# Patient Record
Sex: Female | Born: 1980 | Hispanic: Yes | Marital: Married | Smoking: Never smoker
Health system: Southern US, Community
[De-identification: ages and names within clinical notes are randomized; demographics above are authoritative.]

## PROBLEM LIST (undated history)

## (undated) ENCOUNTER — Inpatient Hospital Stay (HOSPITAL_COMMUNITY): Payer: Self-pay

## (undated) DIAGNOSIS — Z1501 Genetic susceptibility to malignant neoplasm of breast: Secondary | ICD-10-CM

## (undated) DIAGNOSIS — Z8481 Family history of carrier of genetic disease: Secondary | ICD-10-CM

## (undated) DIAGNOSIS — Z8669 Personal history of other diseases of the nervous system and sense organs: Secondary | ICD-10-CM

## (undated) DIAGNOSIS — Z803 Family history of malignant neoplasm of breast: Secondary | ICD-10-CM

## (undated) DIAGNOSIS — B009 Herpesviral infection, unspecified: Secondary | ICD-10-CM

## (undated) DIAGNOSIS — Z1509 Genetic susceptibility to other malignant neoplasm: Secondary | ICD-10-CM

## (undated) DIAGNOSIS — Z8489 Family history of other specified conditions: Secondary | ICD-10-CM

## (undated) DIAGNOSIS — C801 Malignant (primary) neoplasm, unspecified: Secondary | ICD-10-CM

## (undated) HISTORY — PX: BREAST SURGERY: SHX581

## (undated) HISTORY — DX: Personal history of other diseases of the nervous system and sense organs: Z86.69

## (undated) HISTORY — PX: ABDOMINAL HYSTERECTOMY: SHX81

## (undated) HISTORY — PX: TUBAL LIGATION: SHX77

## (undated) HISTORY — DX: Family history of carrier of genetic disease: Z84.81

## (undated) HISTORY — DX: Genetic susceptibility to malignant neoplasm of breast: Z15.01

## (undated) HISTORY — DX: Genetic susceptibility to malignant neoplasm of breast: Z15.09

## (undated) HISTORY — DX: Family history of malignant neoplasm of breast: Z80.3

## (undated) HISTORY — PX: BREAST BIOPSY: SHX20

---

## 1998-11-21 DIAGNOSIS — Z8669 Personal history of other diseases of the nervous system and sense organs: Secondary | ICD-10-CM

## 1998-11-21 HISTORY — DX: Personal history of other diseases of the nervous system and sense organs: Z86.69

## 2001-10-07 ENCOUNTER — Emergency Department (HOSPITAL_COMMUNITY): Admission: EM | Admit: 2001-10-07 | Discharge: 2001-10-07 | Payer: Self-pay | Admitting: *Deleted

## 2001-10-15 ENCOUNTER — Ambulatory Visit (HOSPITAL_COMMUNITY): Admission: RE | Admit: 2001-10-15 | Discharge: 2001-10-15 | Payer: Self-pay | Admitting: Family Medicine

## 2001-10-15 ENCOUNTER — Encounter: Payer: Self-pay | Admitting: Family Medicine

## 2002-02-04 ENCOUNTER — Ambulatory Visit (HOSPITAL_COMMUNITY): Admission: RE | Admit: 2002-02-04 | Discharge: 2002-02-04 | Payer: Self-pay | Admitting: Family Medicine

## 2002-02-04 ENCOUNTER — Encounter: Payer: Self-pay | Admitting: Family Medicine

## 2002-02-25 ENCOUNTER — Encounter: Payer: Self-pay | Admitting: Orthopaedic Surgery

## 2002-02-25 ENCOUNTER — Ambulatory Visit (HOSPITAL_COMMUNITY): Admission: RE | Admit: 2002-02-25 | Discharge: 2002-02-25 | Payer: Self-pay | Admitting: Orthopaedic Surgery

## 2006-03-14 ENCOUNTER — Ambulatory Visit (HOSPITAL_COMMUNITY): Admission: RE | Admit: 2006-03-14 | Discharge: 2006-03-14 | Payer: Self-pay | Admitting: *Deleted

## 2006-08-08 ENCOUNTER — Inpatient Hospital Stay (HOSPITAL_COMMUNITY): Admission: EM | Admit: 2006-08-08 | Discharge: 2006-08-12 | Payer: Self-pay | Admitting: Obstetrics and Gynecology

## 2008-02-18 ENCOUNTER — Emergency Department (HOSPITAL_COMMUNITY): Admission: EM | Admit: 2008-02-18 | Discharge: 2008-02-18 | Payer: Self-pay | Admitting: Emergency Medicine

## 2008-03-26 ENCOUNTER — Other Ambulatory Visit: Admission: RE | Admit: 2008-03-26 | Discharge: 2008-03-26 | Payer: Self-pay | Admitting: Obstetrics and Gynecology

## 2008-09-24 ENCOUNTER — Ambulatory Visit: Payer: Self-pay | Admitting: Obstetrics and Gynecology

## 2008-09-24 ENCOUNTER — Encounter: Payer: Self-pay | Admitting: Obstetrics & Gynecology

## 2008-09-24 ENCOUNTER — Inpatient Hospital Stay (HOSPITAL_COMMUNITY): Admission: AD | Admit: 2008-09-24 | Discharge: 2008-09-26 | Payer: Self-pay | Admitting: Obstetrics & Gynecology

## 2010-01-13 ENCOUNTER — Emergency Department (HOSPITAL_COMMUNITY): Admission: EM | Admit: 2010-01-13 | Discharge: 2010-01-13 | Payer: Self-pay | Admitting: Emergency Medicine

## 2011-02-09 LAB — STREP A DNA PROBE: Group A Strep Probe: NEGATIVE

## 2011-02-09 LAB — RAPID STREP SCREEN (MED CTR MEBANE ONLY): Streptococcus, Group A Screen (Direct): NEGATIVE

## 2011-04-08 NOTE — Op Note (Signed)
NAME:  Monica Hancock, Monica Hancock            ACCOUNT NO.:  [id number]   MEDICAL RECORD NO.:  [id number]          PATIENT TYPE:  INP   LOCATION:  A412                          FACILITY:  [facility]   PHYSICIAN:  Lazaro Arms, M.D.   DATE OF BIRTH:  [birth year]   DATE OF PROCEDURE:  08/09/2006  DATE OF DISCHARGE:                                 OPERATIVE REPORT   INDICATIONS FOR PROCEDURE:  Monica Hancock is a 30 year old gravida 2, para 1 in  the active phase of labor.  She is 6 cm, 100% effaced and has requested an  epidural be placed.   DESCRIPTION OF PROCEDURE:  She was placed in a sitting position.  Betadine  prep was used.  1% lidocaine was injected in the L3-L4 interspace.  The area  was field draped.  A 17 gauge Tuohy needle was used, loss of resistance  technique employed and the epidural space was found with one pass without  difficulty.  10 mL of 0.125% bupivacaine plain was given as a test dose  without ill effects.  The epidural catheter was then fed through the Tuohy  needle and taped down 5 cm from the epidural space.  An additional 10 mL of  0.125% bupivacaine plain was given.  A continuous infusion of 0.125%  bupivacaine with 2 mcg per mL of fentanyl was begun at 12 mL per hour.  The  patient is asymptomatic and beginning to get pain relief.      Lazaro Arms, M.D.  Electronically Signed     LHE/MEDQ  D:  08/09/2006  T:  08/11/2006  Job:  [id number]

## 2011-04-08 NOTE — H&P (Signed)
NAME:  Monica Hancock, MARSAN NO.:  [id number]   MEDICAL RECORD NO.:  [id number]            PATIENT TYPE:   LOCATION:                                 FACILITY:   PHYSICIAN:  Langley Gauss, MD          DATE OF BIRTH:   DATE OF ADMISSION:  DATE OF DISCHARGE:  LH                                HISTORY & PHYSICAL   DATE OF ADMISSION:  Indeterminate.   PLANNED PROCEDURE:  Outpatient genetic amniocentesis.   INDICATION:  Increased risk of Down syndrome.   HISTORY:  The patient is a 30 year old gravida 1, para 1, with a reliable  last menstrual period and good first trimester crown-rump length who comes  in today for discussion of abnormal AFP triple screen. Testing was performed  on February 21, 2006 at which time patient was noted to be 15-2/[redacted] weeks  gestation by a previously described dating criteria. Abnormal results  subsequently revealed positive screen for Down syndrome. Patient is noted to  be at 1 out of 69 risk of Down syndrome. She is not of advanced maternal  age, there is no family history of any genetic disorder, specifically no  prior history of any Down syndrome or repetitive miscarriages. The patient  herself did not recognize the term Down syndrome nor was she familiar with  the term mongoloid. Thus she was shown a photograph and given informational  material for patient understanding regarding Down syndrome, the typical  facial features, the likelihood of at least a mild mental retardation,  increased risk of congenital anomalies, and a shortened life span. Discussed  specifically with the patient that the number generator does not indicate  that the baby is or is not affected with Down syndrome, rather she is  considered to be high risk only because of numbers generated to place her at  slightly increased risk at 1 in 67 which translates into 68 out of 69 that  the baby is unaffected. I offered the patient specifically performance of  genetic amniocentesis  with a 2-day turn around for the results. The patient  is noted to have a posterior placenta with adequate amniotic fluid volume  thus I advised her that the performance of the genetic amniocentesis would  be fairly straight forward and should be considered to be low risk, less  than the stated 1 in 200 risk of fetal loss. The patient is somewhat  disturbed by the abnormal findings. Her sister is also here during these  discussions so any additional questions can possibly be answered by her  sister. The patient does have an anatomic survey scheduled at [facility] in 1 week's time. I advised her it would be best if she make a  decision one way or another on performance of the genetic amniocentesis  within the next 5 days. She will be following up in the office in 1 week's  time or calling sooner if a decision has been made. The patient was also  given the option that the amniocentesis not be performed as she is noted to  be only  1 in 69 risk of Down syndrome.   PAST OBSTETRIC HISTORY:  Significant for prior vaginal delivery in 1993 at  36 weeks, a 5 pound, 4 ounce infant.   ASSESSMENT AND PLAN:  Increased risk for Down syndrome based on AFP triple  screen. Offered genetic amniocentesis on today's visit. If patient opts to  have this done this procedure can be scheduled as an outpatient at [facility].      Langley Gauss, MD  Electronically Signed     DC/MEDQ  D:  03/09/2006  T:  03/09/2006  Job:  [id number]

## 2011-04-08 NOTE — H&P (Signed)
NAME:  Monica Hancock, Monica Hancock            ACCOUNT NO.:  [id number]   MEDICAL RECORD NO.:  [id number]           PATIENT TYPE:  INP   LOCATION:  LDR4                          FACILITY:  [facility]   PHYSICIAN:  Tilda Burrow, M.D. DATE OF BIRTH:  Mar 26, 1981   DATE OF ADMISSION:  DATE OF DISCHARGE:  LH                                HISTORY & PHYSICAL   HISTORY OF PRESENT ILLNESS:  Monica Hancock is a 30 year old gravida 2, para 1.  EDC is 09/22.  She is in for early labor.  She was admitted last night for  observation.  She was 1, thick, minus 3.  She is now 2, 70, and minus 1.   MEDICAL HISTORY:  Negative.   SURGICAL HISTORY:  Negative.   ALLERGIES:  She has no known allergies.   LABORATORY DATA:  Blood type is O positive.  UVS is negative.  Rubella is  nonimmune.  Hepatitis B surface antigen is negative.  HIV is nonreactive.  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  Serology is nonreactive.  GC and Chlamydia on both  cultures are negative.  AFB was abnormal.  She had an ultrasound that ruled  out [place].  GBS is negative.  A 28-week hemoglobin 12, 28-week hematocrit  33.4, 1-hour glucose is 110.  GBS is pending.   PLAN:  We are going to admit and start Pitocin augmentation.      [name], [name]      Tilda Burrow, M.D.  Electronically Signed    DL/MEDQ  D:  14/78/2956  T:  08/09/2006  Job:  [id number]

## 2011-04-08 NOTE — Op Note (Signed)
NAME:  Monica Hancock, Monica Hancock            ACCOUNT NO.:  [id number]   MEDICAL RECORD NO.:  [id number]          PATIENT TYPE:  INP   LOCATION:  A412                          FACILITY:  [facility]   PHYSICIAN:  Tilda Burrow, M.D. DATE OF BIRTH:  19-Jan-1981   DATE OF PROCEDURE:  DATE OF DISCHARGE:                                 OPERATIVE REPORT   HISTORY OF PRESENT ILLNESS:  Onset of labor on September 19, at 9 a.m.Marland Kitchen  Date of delivery:  September 19, at 11 p.m.  Length of first stage of labor  10 hours and 24 minutes.  Length of second stage labor 47 minutes, length of  third stage labor 10 minutes.   DELIVERY NOTE:  [name] had a vacuum assisted delivery under epidural  anesthesia from a crowning station due to fetal decelerations and fetal  bradycardia.  Vacuum was placed on the vertex presentation and Kiwi pump to  green marker on the Kiwi Vac and assisted during three contractions.  Midline episiotomy was cut and infant had delivered without complication and  spontaneous __________were noted.  Apgars were 9 and 9.  Cord blood gas and  cord blood was obtained.  Third stage of labor was actually managed with 20  units of Pitocin and 7 cc of LR to rapid rate.  Placenta was delivered  spontaneously via Schultze's mechanism.  Three vessel cord was noted,  membranes are noted to be intact upon inspection.  There is no meconium  noted at delivery for which it was clear at delivery.  Midline episiotomy  was repaired with two interrupted sutures and 2-0 Vicryl subcuticular to  close.  Good hemostasis was obtained.  Estimated blood loss was  approximately 250 cc.  Epidural catheter was removed with blue tip intact.  Mother and infant were stabilized.  It was noted at birth that the infant  had a temperature of 101.7.  Pediatrician was notified.      [name], [name]      Tilda Burrow, M.D.  Electronically Signed    DL/MEDQ  D:  16/08/9603  T:  08/10/2006  Job:  [id number]   cc:    Francoise Schaumann. Raynelle Highland  Fax: [phone]   Tilda Burrow, M.D.  Fax: [phone]

## 2011-04-08 NOTE — H&P (Signed)
NAMENOA, GALVAO            ACCOUNT NO.:  [id number]   MEDICAL RECORD NO.:  [id number]          PATIENT TYPE:  OIB   LOCATION:  LDR4                          FACILITY:  [facility]   PHYSICIAN:  Lazaro Arms, M.D.   DATE OF BIRTH:  1981-07-03   DATE OF ADMISSION:  08/08/2006  DATE OF DISCHARGE:  LH                                HISTORY & PHYSICAL   CHIEF COMPLAINT:  Contractions x8 hours.   HISTORY OF PRESENT ILLNESS:  Monica Hancock is a 30 year old gravida 2 para 1 with  an EDC of August 12, 2006 placing her at 39-1/[redacted] weeks gestation.  She  began prenatal care at Dr. Preston Fleeting office in her first trimester and has  had regular visits since then.   PRENATAL LABORATORIES:  Blood type O negative.  Rubella is nonimmune.  Hepatitis B surface antigen, HIV, RPR, gonorrhea, Chlamydia and group B  strep are all negative.  She was seropositive for HSV-2.  One-hour GTT was  normal at 110.  MSAFP initially showed increased Down syndrome risk at 1:69.  Her level 3 ultrasound at [facility] reduced that possibility to 1:138.  She has  basically had an uneventful pregnancy.  Blood pressures have been 100s-  130s/60s-80s.  Total weight gain has been about 25 pounds with appropriate  fundal height growth.   PAST MEDICAL HISTORY:  Positive for headaches.   PAST SURGICAL HISTORY:  No surgical history.   ALLERGIES:  NO DRUG ALLERGIES.   SOCIAL HISTORY:  She is married.  Denies cigarette smoking, alcohol or drug  use.   FAMILY HISTORY:  No contributing family history.   OBSTETRICAL HISTORY:  Positive for a spontaneous vaginal delivery at [facility] at 36 weeks, claims that she had a 2-day labor of a 5-pound 4-ounce  female.   PHYSICAL EXAMINATION:  HEENT:  Within normal limits.  HEART:  Regular rate and rhythm.  LUNGS:  Clear.  ABDOMEN:  Soft and nontender.  She is having mild contractions every 4-5  minutes.  Fetal heart rate is reactive without decelerations.  PELVIC EXAM UPON ADMISSION:   Was 1.5 cm, thick, anterior, -2 to -3 station.  She has not really made any change since she has been here.  LEGS:  Negative.   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   IMPRESSION:  1. Intrauterine pregnancy at 39-1/2 weeks.  2. Early labor versus false labor.   PLAN:  Options were discussed.  She did receive 14 mg of morphine sulfate IM  for therapeutic rest.  We will keep her overnight to see what happens with  her contractions.  We discussed the possibility of augmentation in the  morning if she is not in good labor and we will revisit that idea in the  morning.  Dr. Despina Hidden is aware of the plan of care.      Monica Hancock, C.N.M.      Lazaro Arms, M.D.  Electronically Signed    FC/MEDQ  D:  08/08/2006  T:  08/08/2006  Job:  [id number]   cc:   [facility]   Viviann Spare  Jill Poling, DO, FAAP  Fax: [phone]

## 2011-08-15 LAB — URINALYSIS, ROUTINE W REFLEX MICROSCOPIC
Glucose, UA: NEGATIVE
Ketones, ur: NEGATIVE
Protein, ur: NEGATIVE
Urobilinogen, UA: 0.2

## 2011-08-15 LAB — PREGNANCY, URINE: Preg Test, Ur: POSITIVE

## 2011-08-23 LAB — CBC
HCT: 41.2
Hemoglobin: 13.9
MCHC: 33.8
Platelets: 295
RDW: 13.4

## 2011-08-23 LAB — RH IMMUNE GLOB WKUP(>/=20WKS)(NOT WOMEN'S HOSP)

## 2011-09-03 LAB — GC/CHLAMYDIA PROBE AMP, GENITAL
Chlamydia: NEGATIVE
Gonorrhea: NEGATIVE

## 2011-09-03 LAB — HIV ANTIBODY (ROUTINE TESTING W REFLEX): HIV: NONREACTIVE

## 2011-09-03 LAB — RPR: RPR: NONREACTIVE

## 2011-09-03 LAB — RUBELLA ANTIBODY, IGM: Rubella: UNDETERMINED

## 2011-09-06 ENCOUNTER — Other Ambulatory Visit (HOSPITAL_COMMUNITY)
Admission: RE | Admit: 2011-09-06 | Discharge: 2011-09-06 | Disposition: A | Discharge status: Home / Self Care | Payer: Self-pay | Source: Ambulatory Visit | Attending: Obstetrics & Gynecology | Admitting: Obstetrics & Gynecology

## 2011-09-06 ENCOUNTER — Other Ambulatory Visit: Payer: Self-pay | Admitting: Obstetrics & Gynecology

## 2011-09-06 DIAGNOSIS — Z01419 Encounter for gynecological examination (general) (routine) without abnormal findings: Secondary | ICD-10-CM | POA: Insufficient documentation

## 2011-09-06 DIAGNOSIS — Z113 Encounter for screening for infections with a predominantly sexual mode of transmission: Secondary | ICD-10-CM | POA: Insufficient documentation

## 2011-09-06 LAB — HEPATITIS B SURFACE ANTIGEN: Hepatitis B Surface Ag: NEGATIVE

## 2011-11-22 NOTE — L&D Delivery Note (Signed)
Delivery Note At 5:54 PM a viable female was delivered via Vaginal, Spontaneous Delivery (Presentation: Right Occiput Anterior).  APGAR: 8, 9; weight .   Placenta status: Intact, Spontaneous.  Cord: 3 vessels with the following complications: None.    Anesthesia: Epidural  Episiotomy: None Lacerations: 1st degree Suture Repair: 3.0 vicryl Est. Blood Loss (mL): 200  Mom to postpartum.  Baby to nursery-stable.  Mellina Benison JEHIEL 04/12/2012, 6:10 PM

## 2012-02-08 ENCOUNTER — Encounter: Payer: Medicaid Other | Attending: Obstetrics & Gynecology | Admitting: Dietician

## 2012-02-08 DIAGNOSIS — Z713 Dietary counseling and surveillance: Secondary | ICD-10-CM | POA: Insufficient documentation

## 2012-02-08 DIAGNOSIS — O99320 Drug use complicating pregnancy, unspecified trimester: Secondary | ICD-10-CM | POA: Insufficient documentation

## 2012-02-08 DIAGNOSIS — F1920 Other psychoactive substance dependence, uncomplicated: Secondary | ICD-10-CM | POA: Insufficient documentation

## 2012-02-09 ENCOUNTER — Encounter: Payer: Self-pay | Admitting: Dietician

## 2012-02-09 NOTE — Progress Notes (Signed)
  Patient was seen on 02/08/2012 for Gestational Diabetes self-management class at the [facility]. The following learning objectives were met by the patient during this course:   States the definition of Gestational Diabetes  States why dietary management is important in controlling blood glucose  Describes the effects each nutrient has on blood glucose levels  Demonstrates ability to create a balanced meal plan  Demonstrates carbohydrate counting   States when to check blood glucose levels  Demonstrates proper blood glucose monitoring techniques  States the effect of stress and exercise on blood glucose levels  States the importance of limiting caffeine and abstaining from alcohol and smoking  Blood glucose monitor given: Accu-Chek SmartView Meter Kit (was not charged for the meter) Lot # [id number] Exp: 04/20/2013 Blood glucose reading: 83  Patient instructed to monitor glucose levels:Fasting and 2 hours past 1st bite of each meal. FBS: 60 - <90 2 hr < 120   Patient received handouts:  Nutrition Diabetes and Pregnancy  Carbohydrate Counting List  Patient will be seen for follow-up as needed.

## 2012-02-21 ENCOUNTER — Encounter: Payer: Self-pay | Admitting: Obstetrics & Gynecology

## 2012-03-03 ENCOUNTER — Emergency Department (HOSPITAL_COMMUNITY)
Admission: EM | Admit: 2012-03-03 | Discharge: 2012-03-04 | Disposition: A | Discharge status: Home / Self Care | Payer: Medicaid Other | Attending: Emergency Medicine | Admitting: Emergency Medicine

## 2012-03-03 ENCOUNTER — Encounter (HOSPITAL_COMMUNITY): Payer: Self-pay | Admitting: *Deleted

## 2012-03-03 DIAGNOSIS — K529 Noninfective gastroenteritis and colitis, unspecified: Secondary | ICD-10-CM

## 2012-03-03 DIAGNOSIS — O2693 Pregnancy related conditions, unspecified, third trimester: Secondary | ICD-10-CM

## 2012-03-03 DIAGNOSIS — O99891 Other specified diseases and conditions complicating pregnancy: Secondary | ICD-10-CM | POA: Insufficient documentation

## 2012-03-03 DIAGNOSIS — K5289 Other specified noninfective gastroenteritis and colitis: Secondary | ICD-10-CM | POA: Insufficient documentation

## 2012-03-03 LAB — CBC
HCT: 38.5 % (ref 36.0–46.0)
Hemoglobin: 12.9 g/dL (ref 12.0–15.0)
MCH: 30.1 pg (ref 26.0–34.0)
MCHC: 33.5 g/dL (ref 30.0–36.0)
MCV: 89.7 fL (ref 78.0–100.0)

## 2012-03-03 LAB — BASIC METABOLIC PANEL
BUN: 6 mg/dL (ref 6–23)
Creatinine, Ser: 0.46 mg/dL — ABNORMAL LOW (ref 0.50–1.10)
GFR calc Af Amer: >90 mL/min (ref >90)
Glucose, Bld: 94 mg/dL (ref 70–99)

## 2012-03-03 MED ORDER — SODIUM CHLORIDE 0.9 % IV SOLN: INTRAVENOUS | Status: DC

## 2012-03-03 MED ORDER — ONDANSETRON HCL 4 MG/2ML IJ SOLN
4.0000 mg | Freq: Once | INTRAMUSCULAR | Status: AC
  Administered 2012-03-03: 4 mg via INTRAVENOUS
  Filled 2012-03-03: qty 2

## 2012-03-03 MED ORDER — SODIUM CHLORIDE 0.9 % IV BOLUS (SEPSIS)
1000.0000 mL | Freq: Once | INTRAVENOUS | Status: AC
  Administered 2012-03-03: 1000 mL via INTRAVENOUS

## 2012-03-03 NOTE — ED Notes (Signed)
Called Jen at [facility] and advised patient is on baby monitor. Fetal heartbeat approximately 147 at this time. Patient states her abdominal pain "is starting to feel like contractions." States describes pain as "coming, getting stronger, then going away."

## 2012-03-03 NOTE — ED Provider Notes (Addendum)
History   This chart was scribed for Monica Jakes, MD by Monica Hancock. The patient was seen in room APA01/APA01 and the patient's care was started at 9:35 PM     CSN: [id number]  Arrival date & time 03/03/12  7829   First MD Initiated Contact with Patient 03/03/12 2052      Chief Complaint  Patient presents with  . Abdominal Pain  . Emesis  . Diarrhea    (Consider location/radiation/quality/duration/timing/severity/associated sxs/prior treatment) HPI  Monica Hancock is a 31 y.o. female who presents to the Emergency Department complaining of moderate, episodic abdominal pain located periumbilically onset today (6:00AM) with associated symptoms of nausea, vomiting(X4-5) , diarrhea (X4-5). The pt states "the abdominal pain is a sharp pain, which has intensified throughout the day." The pt infomrs the EDP "the abdominal pain feels like contractions, as of 8:30PM". The pt due date is may 29th. The last time the patient vomited was 6:30PM. In addition, the patient informs the EDP that "her was has not broken yet."  Pt denies chest pain, dysuria, fever, back pain, hematuria, swelling in legs, bleeding problems, rash. The patient is [redacted] weeks pregnant G:4,P:3.  PCP is Dr. Despina Hidden.  Family History  Problem Relation Age of Onset  . Diabetes Mother     History  Substance Use Topics  . Smoking status: Never Smoker   . Smokeless tobacco: Never Used  . Alcohol Use: No    OB History    Grav Para Term Preterm Abortions TAB SAB Ect Mult Living   4 3        3       Review of Systems  All other systems reviewed and are negative.    10 Systems reviewed and all are negative for acute change except as noted in the HPI.    Allergies  Review of patient's allergies indicates no known allergies.  Home Medications   Current Outpatient Rx  Name Route Sig Dispense Refill  . ONDANSETRON 8 MG PO TBDP Oral Take 1 tablet (8 mg total) by mouth every 8 (eight) hours as needed for nausea.  10 tablet 0  . PRENATAL AD PO Oral Take 1 tablet by mouth daily.    Marland Kitchen PROMETHAZINE HCL 25 MG PO TABS Oral Take 1 tablet (25 mg total) by mouth every 6 (six) hours as needed for nausea. 12 tablet 0    BP 118/71  Pulse 92  Temp(Src) 97.9 F (36.6 C) (Oral)  Resp 18  Ht 4\' 9"  (1.448 m)  Wt 161 lb (73.029 kg)  BMI 34.84 kg/m2  SpO2 100%  Physical Exam  Nursing note and vitals reviewed. Constitutional: She is oriented to person, place, and time. She appears well-developed and well-nourished.  HENT:  Head: Atraumatic.  Right Ear: External ear normal.  Left Ear: External ear normal.  Nose: Nose normal.  Mouth/Throat: Oropharynx is clear and moist.  Eyes: Conjunctivae and EOM are normal.  Neck: Normal range of motion. Neck supple.  Cardiovascular: Normal rate, regular rhythm and normal heart sounds.  Exam reveals no gallop and no friction rub.   No murmur heard. Pulmonary/Chest: Effort normal and breath sounds normal. She has no wheezes. She has no rales.  Abdominal: Soft. Bowel sounds are normal.       Gravidas abdomen.   Musculoskeletal: Normal range of motion.  Neurological: She is alert and oriented to person, place, and time.  Skin: Skin is warm and dry.  Psychiatric: She has a normal mood and affect.  Her behavior is normal.    ED Course  Procedures (including critical care time)  DIAGNOSTIC STUDIES: Oxygen Saturation is 100% on room air, normal by my interpretation.    COORDINATION OF CARE:   Results for orders placed during the hospital encounter of 03/03/12  ABO/RH      Component Value Range   RH-Type Negative     ABO Grouping O    HIV ANTIBODY (ROUTINE TESTING)      Component Value Range   HIV Non-reactive    GC/CHLAMYDIA PROBE AMP, GENITAL      Component Value Range   Gonorrhea Negative     Chlamydia Negative    HEPATITIS B SURFACE ANTIGEN      Component Value Range   Hepatitis B Surface Ag Negative    RUBELLA ANTIBODY, IGM      Component Value Range    Rubella Equivocal    RPR      Component Value Range   RPR Nonreactive    CBC      Component Value Range   WBC 9.5  4.0 - 10.5 (K/uL)   RBC 4.29  3.87 - 5.11 (MIL/uL)   Hemoglobin 12.9  12.0 - 15.0 (g/dL)   HCT 16.1  09.6 - 04.5 (%)   MCV 89.7  78.0 - 100.0 (fL)   MCH 30.1  26.0 - 34.0 (pg)   MCHC 33.5  30.0 - 36.0 (g/dL)   RDW 40.9  81.1 - 91.4 (%)   Platelets 295  150 - 400 (K/uL)  BASIC METABOLIC PANEL      Component Value Range   Sodium 134 (*) 135 - 145 (mEq/L)   Potassium 3.6  3.5 - 5.1 (mEq/L)   Chloride 102  96 - 112 (mEq/L)   CO2 21  19 - 32 (mEq/L)   Glucose, Bld 94  70 - 99 (mg/dL)   BUN 6  6 - 23 (mg/dL)   Creatinine, Ser 7.82 (*) 0.50 - 1.10 (mg/dL)   Calcium 95.6  8.4 - 10.5 (mg/dL)   GFR calc non Af Amer >90  >90 (mL/min)   GFR calc Af Amer >90  >90 (mL/min)   No results found.      1. Gastroenteritis   2. Pregnancy related condition in third trimester     9:41PM- EDP at bedside discusses treatment plan.   MDM  Patient with acute onset of nausea vomiting and diarrhea patient is a [redacted] weeks pregnant today is May 29. [facility] by [facility]. Symptoms associated with some periumbilical pain during the day no severe abdominal pain approximately one hour prior to the time that we saw her start to get discomfort in the pelvic area which patient believes is related to contractions. Patient was monitored here [facility] no evidence of contractions or fetal distress. On examination the patient's cervix was not dilated. Discussed with Dr. Debroah Loop on call for OB/GYN stated patient can be discharged home symptoms most likely due a viral gastroenteritis. No evidence of premature labor at this time. Patient will be sent home with Zofran and Phenergan she been given Zofran here twice without any further vomiting.  Also clinically not concerned about acute surgical abdominal process. Consideration was given for possibly appendicitis  but with the onset of the vomiting and diarrhea simultaneously and the frequency of it this is most likely a viral gastroenteritis.       I personally performed the services described in this documentation, which was scribed in  my presence. The recorded information has been reviewed and considered.     Monica Jakes, MD 03/04/12 3382  Monica Jakes, MD 03/04/12 1249

## 2012-03-03 NOTE — Progress Notes (Signed)
Call from [place], [place] at [facility] ED for pt c/o abd pain x 3 days. G4P3 33.3 weeks with regular care to [facility] in [place]. Placed on OBIX monitor, assessing. Pt states they have not felt like contractions before visit to ED

## 2012-03-03 NOTE — ED Notes (Addendum)
Spoke with Lorelei Pont, RN, tracing reassuring and reactive, no UCs, she will talk with EDP r/t cervical exam Called by Lawson Fiscal at 2355, plan to d/c pt home, tracing reactive

## 2012-03-03 NOTE — ED Notes (Signed)
Pt with abd pain that is not contractions per pt with N/v/D since this morning, abd pain has increased since throughout the day, pt is [redacted] weeks pregnant and last visit for prenatal was 2 weeks ago, this is fourth pregnancy

## 2012-03-04 MED ORDER — PROMETHAZINE HCL 25 MG PO TABS: 25.0000 mg | ORAL_TABLET | Freq: Four times a day (QID) | ORAL | Status: DC | PRN

## 2012-03-04 MED ORDER — ONDANSETRON 8 MG PO TBDP: 8.0000 mg | ORAL_TABLET | Freq: Three times a day (TID) | ORAL | Status: AC | PRN

## 2012-03-04 NOTE — Discharge Instructions (Signed)
B.R.A.T. Diet Your doctor has recommended the B.R.A.T. diet for you or your child until the condition improves. This is often used to help control diarrhea and vomiting symptoms. If you or your child can tolerate clear liquids, you may have:  Bananas.   Rice.   Applesauce.   Toast (and other simple starches such as crackers, potatoes, noodles).  Be sure to avoid dairy products, meats, and fatty foods until symptoms are better. Fruit juices such as apple, grape, and prune juice can make diarrhea worse. Avoid these. Continue this diet for 2 days or as instructed by your caregiver. Document Released: 11/07/2005 Document Revised: 10/27/2011 Document Reviewed: 04/26/2007 [facility] Patient Information 2012 [place], [place].  Return for any new or worse symptoms. Also contact her OB/GYN physicians prior to coming back here could you may need to be [facility]. Take antinausea medicine as directed rest increase fluids. Obviously followup for any signs of labor or breaking of your water.

## 2012-03-04 NOTE — ED Notes (Signed)
Called Jen at [facility]. Stated there has been no activity on monitor. No contractions noted during monitoring. Advised EDMD.

## 2012-03-27 LAB — OB RESULTS CONSOLE GBS: GBS: NEGATIVE

## 2012-04-03 ENCOUNTER — Encounter (HOSPITAL_COMMUNITY): Payer: Self-pay | Admitting: *Deleted

## 2012-04-03 ENCOUNTER — Telehealth (HOSPITAL_COMMUNITY): Payer: Self-pay | Admitting: *Deleted

## 2012-04-03 NOTE — Telephone Encounter (Signed)
Preadmission screen  

## 2012-04-11 ENCOUNTER — Inpatient Hospital Stay (HOSPITAL_COMMUNITY)
Admission: RE | Admit: 2012-04-11 | Discharge: 2012-04-14 | DRG: 775 | Disposition: A | Discharge status: Home / Self Care | Payer: Medicaid Other | Source: Ambulatory Visit | Attending: Obstetrics & Gynecology | Admitting: Obstetrics & Gynecology

## 2012-04-11 ENCOUNTER — Encounter (HOSPITAL_COMMUNITY): Payer: Self-pay

## 2012-04-11 VITALS — BP 110/74 | HR 70 | Temp 97.5°F | Resp 18 | Ht 60.0 in | Wt 163.0 lb

## 2012-04-11 DIAGNOSIS — O99814 Abnormal glucose complicating childbirth: Principal | ICD-10-CM | POA: Diagnosis present

## 2012-04-11 DIAGNOSIS — O24419 Gestational diabetes mellitus in pregnancy, unspecified control: Secondary | ICD-10-CM

## 2012-04-11 DIAGNOSIS — Z331 Pregnant state, incidental: Secondary | ICD-10-CM

## 2012-04-11 LAB — CBC
HCT: 41.2 % (ref 36.0–46.0)
Hemoglobin: 13.6 g/dL (ref 12.0–15.0)
MCV: 90.9 fL (ref 78.0–100.0)
RBC: 4.53 MIL/uL (ref 3.87–5.11)
WBC: 9 10*3/uL (ref 4.0–10.5)

## 2012-04-11 MED ORDER — TERBUTALINE SULFATE 1 MG/ML IJ SOLN: 0.2500 mg | Freq: Once | INTRAMUSCULAR | Status: AC | PRN

## 2012-04-11 MED ORDER — CITRIC ACID-SODIUM CITRATE 334-500 MG/5ML PO SOLN: 30.0000 mL | ORAL | Status: DC | PRN

## 2012-04-11 MED ORDER — LACTATED RINGERS IV SOLN
INTRAVENOUS | Status: DC
  Administered 2012-04-12 (×2): via INTRAVENOUS

## 2012-04-11 MED ORDER — LACTATED RINGERS IV SOLN: 500.0000 mL | INTRAVENOUS | Status: DC | PRN

## 2012-04-11 MED ORDER — NALBUPHINE SYRINGE 5 MG/0.5 ML
5.0000 mg | INJECTION | INTRAMUSCULAR | Status: DC | PRN
  Administered 2012-04-12: 10 mg via INTRAVENOUS
  Filled 2012-04-11: qty 1

## 2012-04-11 MED ORDER — FLEET ENEMA 7-19 GM/118ML RE ENEM: 1.0000 | ENEMA | RECTAL | Status: DC | PRN

## 2012-04-11 MED ORDER — LIDOCAINE HCL (PF) 1 % IJ SOLN
30.0000 mL | INTRAMUSCULAR | Status: DC | PRN
  Filled 2012-04-11: qty 30

## 2012-04-11 MED ORDER — OXYTOCIN 20 UNITS IN LACTATED RINGERS INFUSION - SIMPLE
1.0000 m[IU]/min | INTRAVENOUS | Status: DC
  Administered 2012-04-12: 2 m[IU]/min via INTRAVENOUS
  Filled 2012-04-11: qty 1000

## 2012-04-11 MED ORDER — OXYTOCIN 20 UNITS IN LACTATED RINGERS INFUSION - SIMPLE
125.0000 mL/h | Freq: Once | INTRAVENOUS | Status: AC
  Administered 2012-04-12: 999 mL/h via INTRAVENOUS

## 2012-04-11 MED ORDER — OXYTOCIN BOLUS FROM INFUSION
500.0000 mL | Freq: Once | INTRAVENOUS | Status: DC
  Filled 2012-04-11: qty 500

## 2012-04-11 MED ORDER — ACYCLOVIR 200 MG PO CAPS
400.0000 mg | ORAL_CAPSULE | Freq: Two times a day (BID) | ORAL | Status: DC
  Administered 2012-04-11 – 2012-04-12 (×2): 400 mg via ORAL
  Filled 2012-04-11 (×4): qty 2

## 2012-04-11 MED ORDER — IBUPROFEN 600 MG PO TABS: 600.0000 mg | ORAL_TABLET | Freq: Four times a day (QID) | ORAL | Status: DC | PRN

## 2012-04-11 MED ORDER — ONDANSETRON HCL 4 MG/2ML IJ SOLN
4.0000 mg | Freq: Four times a day (QID) | INTRAMUSCULAR | Status: DC | PRN
  Administered 2012-04-12: 4 mg via INTRAVENOUS
  Filled 2012-04-11: qty 2

## 2012-04-11 MED ORDER — ACETAMINOPHEN 325 MG PO TABS: 650.0000 mg | ORAL_TABLET | ORAL | Status: DC | PRN

## 2012-04-11 MED ORDER — OXYCODONE-ACETAMINOPHEN 5-325 MG PO TABS: 1.0000 | ORAL_TABLET | ORAL | Status: DC | PRN

## 2012-04-11 NOTE — Progress Notes (Signed)
Monica Hancock is a 31 y.o. [phone] at [redacted]w[redacted]d by LMP (confirmed by [place]) admitted for induction of labor due to Gestational diabetes.  Subjective: Pt recently admitted.  Objective: BP 109/60  Pulse 84  Temp(Src) 98.4 F (36.9 C) (Oral)  Resp 18  Ht 5' (1.524 m)  Wt 73.936 kg (163 lb)  BMI 31.83 kg/m2  LMP 07/13/2011    Labs: Lab Results  Component Value Date   WBC 9.0 04/11/2012   HGB 13.6 04/11/2012   HCT 41.2 04/11/2012   MCV 90.9 04/11/2012   PLT 308 04/11/2012    Assessment / Plan: Induction of Labor.  Foley Balloon catheter placed.  Pt tolerated well.  Will plan for pitocin once foley is out or in 8 hours.  I/D:  GBS negative - No ABX indicated Anticipated MOD:  NSVD   Andrena Mews, DO [facility] Resident - PGY-1 04/11/2012 10:40 PM

## 2012-04-11 NOTE — H&P (Signed)
Monica Hancock is a 31 y.o. female [phone] presenting 39-0 weeks for Induction of labor due to Gestational Diabetes.  Dating confirmed by U/S.  No complications during pregnancy.  O negative blood type RHO GAM given at 28 weeks.  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  On Glyburide since 28 weeks.     History OB History    Grav Para Term Preterm Abortions TAB SAB Ect Mult Living   4 3 2 1      3      Past Medical History  Diagnosis Date  . Gestational diabetes    No past surgical history on file. Family History: family history includes Cancer in her mother and Diabetes in her maternal grandmother and mother. Social History:  reports that she has never smoked. She has never used smokeless tobacco. She reports that she does not drink alcohol or use illicit drugs.  Review of Systems  Constitutional: Negative.   HENT: Negative.   Eyes: Negative.   Respiratory: Negative.   Cardiovascular: Negative.   Gastrointestinal: Negative.   Genitourinary: Negative.   Musculoskeletal: Negative.   Skin: Negative.   Neurological: Negative.   Endo/Heme/Allergies: Negative.   Psychiatric/Behavioral: Negative.      Blood pressure 109/60, pulse 84, temperature 98.4 F (36.9 C), temperature source Oral, resp. rate 18, height 5' (1.524 m), weight 73.936 kg (163 lb), last menstrual period 07/13/2011. Maternal Exam:  Abdomen: Patient reports the following abdominal tenderness: suprapubic.  Estimated fetal weight is 6lb 9oz per [place].   Fetal presentation: vertex  Introitus: Normal vulva. Vulva is negative for lesion.  Normal vagina.  Ferning test: not done.  Nitrazine test: not done.  Pelvis: adequate for delivery.   Cervix: Cervix evaluated by digital exam.     Fetal Exam Fetal Monitor Review: Variability: moderate (6-25 bpm).   Pattern: no decelerations.    Fetal State Assessment: Category I - tracings are normal.    CERVICAL EXAM: Dilation 1cm Thick Effacement:  0 Posterior   Physical Exam  Constitutional: She appears well-developed and well-nourished. No distress.  HENT:  Head: Normocephalic and atraumatic.  Eyes: Conjunctivae are normal. Right eye exhibits no discharge. Left eye exhibits no discharge. No scleral icterus.  Respiratory: Effort normal and breath sounds normal.  GI: Soft. She exhibits distension and mass. There is tenderness in the suprapubic area. There is no rebound and no guarding.  Genitourinary: Vagina normal and uterus normal. Vulva exhibits no lesion.  Musculoskeletal: Normal range of motion. She exhibits no edema.  Skin: Skin is warm and dry. She is not diaphoretic.  Psychiatric: She has a normal mood and affect. Her behavior is normal. Judgment and thought content normal.    Prenatal labs: ABO, Rh: O/Negative/-- (10/16 0000) Antibody:   Rubella: Equivocal (10/13 0000) RPR: Nonreactive (10/13 0000)  HBsAg: Negative (10/16 0000)  HIV: Non-reactive (10/13 0000)  GBS: Negative (05/07 0000)   Assessment/Plan: IOL with Pitocin and Foley Bulb.   Planned NSVD.     Andrena Mews, DO [facility] Resident - PGY-1 04/11/2012 9:57 PM

## 2012-04-12 ENCOUNTER — Encounter (HOSPITAL_COMMUNITY): Payer: Self-pay

## 2012-04-12 ENCOUNTER — Encounter (HOSPITAL_COMMUNITY): Payer: Self-pay | Admitting: Anesthesiology

## 2012-04-12 ENCOUNTER — Inpatient Hospital Stay (HOSPITAL_COMMUNITY): Payer: Medicaid Other | Admitting: Anesthesiology

## 2012-04-12 DIAGNOSIS — O99814 Abnormal glucose complicating childbirth: Secondary | ICD-10-CM

## 2012-04-12 MED ORDER — ONDANSETRON HCL 4 MG/2ML IJ SOLN: 4.0000 mg | INTRAMUSCULAR | Status: DC | PRN

## 2012-04-12 MED ORDER — PHENYLEPHRINE 40 MCG/ML (10ML) SYRINGE FOR IV PUSH (FOR BLOOD PRESSURE SUPPORT): 80.0000 ug | PREFILLED_SYRINGE | INTRAVENOUS | Status: DC | PRN

## 2012-04-12 MED ORDER — WITCH HAZEL-GLYCERIN EX PADS: 1.0000 "application " | MEDICATED_PAD | CUTANEOUS | Status: DC | PRN

## 2012-04-12 MED ORDER — LIDOCAINE HCL (PF) 1 % IJ SOLN
INTRAMUSCULAR | Status: DC | PRN
  Administered 2012-04-12 (×3): 4 mL

## 2012-04-12 MED ORDER — LACTATED RINGERS IV SOLN
500.0000 mL | Freq: Once | INTRAVENOUS | Status: AC
  Administered 2012-04-12: 1000 mL via INTRAVENOUS

## 2012-04-12 MED ORDER — DIPHENHYDRAMINE HCL 50 MG/ML IJ SOLN: 12.5000 mg | INTRAMUSCULAR | Status: DC | PRN

## 2012-04-12 MED ORDER — ZOLPIDEM TARTRATE 5 MG PO TABS: 5.0000 mg | ORAL_TABLET | Freq: Every evening | ORAL | Status: DC | PRN

## 2012-04-12 MED ORDER — EPHEDRINE 5 MG/ML INJ
10.0000 mg | INTRAVENOUS | Status: DC | PRN
  Filled 2012-04-12: qty 4

## 2012-04-12 MED ORDER — DIBUCAINE 1 % RE OINT: 1.0000 "application " | TOPICAL_OINTMENT | RECTAL | Status: DC | PRN

## 2012-04-12 MED ORDER — DIPHENHYDRAMINE HCL 25 MG PO CAPS: 25.0000 mg | ORAL_CAPSULE | Freq: Four times a day (QID) | ORAL | Status: DC | PRN

## 2012-04-12 MED ORDER — IBUPROFEN 600 MG PO TABS
600.0000 mg | ORAL_TABLET | Freq: Four times a day (QID) | ORAL | Status: DC
  Administered 2012-04-12 – 2012-04-14 (×6): 600 mg via ORAL
  Filled 2012-04-12 (×6): qty 1

## 2012-04-12 MED ORDER — SENNOSIDES-DOCUSATE SODIUM 8.6-50 MG PO TABS
2.0000 | ORAL_TABLET | Freq: Every day | ORAL | Status: DC
  Administered 2012-04-12 – 2012-04-13 (×2): 2 via ORAL

## 2012-04-12 MED ORDER — PHENYLEPHRINE 40 MCG/ML (10ML) SYRINGE FOR IV PUSH (FOR BLOOD PRESSURE SUPPORT)
80.0000 ug | PREFILLED_SYRINGE | INTRAVENOUS | Status: DC | PRN
  Filled 2012-04-12: qty 5

## 2012-04-12 MED ORDER — SIMETHICONE 80 MG PO CHEW: 80.0000 mg | CHEWABLE_TABLET | ORAL | Status: DC | PRN

## 2012-04-12 MED ORDER — LANOLIN HYDROUS EX OINT: TOPICAL_OINTMENT | CUTANEOUS | Status: DC | PRN

## 2012-04-12 MED ORDER — ZOLPIDEM TARTRATE 5 MG PO TABS
5.0000 mg | ORAL_TABLET | Freq: Every evening | ORAL | Status: DC | PRN
  Administered 2012-04-12: 5 mg via ORAL
  Filled 2012-04-12: qty 1

## 2012-04-12 MED ORDER — EPHEDRINE 5 MG/ML INJ: 10.0000 mg | INTRAVENOUS | Status: DC | PRN

## 2012-04-12 MED ORDER — ONDANSETRON HCL 4 MG PO TABS: 4.0000 mg | ORAL_TABLET | ORAL | Status: DC | PRN

## 2012-04-12 MED ORDER — PRENATAL MULTIVITAMIN CH
1.0000 | ORAL_TABLET | Freq: Every day | ORAL | Status: DC
  Administered 2012-04-13: 1 via ORAL
  Filled 2012-04-12: qty 1

## 2012-04-12 MED ORDER — FENTANYL 2.5 MCG/ML BUPIVACAINE 1/10 % EPIDURAL INFUSION (WH - ANES)
14.0000 mL/h | INTRAMUSCULAR | Status: DC
  Administered 2012-04-12 (×2): 14 mL/h via EPIDURAL
  Filled 2012-04-12 (×2): qty 60

## 2012-04-12 MED ORDER — OXYCODONE-ACETAMINOPHEN 5-325 MG PO TABS
1.0000 | ORAL_TABLET | ORAL | Status: DC | PRN
  Administered 2012-04-13 (×2): 1 via ORAL
  Filled 2012-04-12 (×2): qty 1

## 2012-04-12 MED ORDER — BENZOCAINE-MENTHOL 20-0.5 % EX AERO: 1.0000 "application " | INHALATION_SPRAY | CUTANEOUS | Status: DC | PRN

## 2012-04-12 MED ORDER — TETANUS-DIPHTH-ACELL PERTUSSIS 5-2.5-18.5 LF-MCG/0.5 IM SUSP
0.5000 mL | Freq: Once | INTRAMUSCULAR | Status: AC
  Administered 2012-04-13: 0.5 mL via INTRAMUSCULAR
  Filled 2012-04-12 (×2): qty 0.5

## 2012-04-12 NOTE — Progress Notes (Signed)
  Subjective: Patient comfortable with epidural  Objective: BP 114/68  Pulse 84  Temp(Src) 98.3 F (36.8 C) (Oral)  Resp 20  Ht 5' (1.524 m)  Wt 73.936 kg (163 lb)  BMI 31.83 kg/m2  SpO2 96%  LMP 07/13/2011   Total I/O In: -  Out: 300 [Urine:300]  FHT:  FHR: 130 bpm, variability: moderate,  accelerations:  Present,  decelerations:  Absent UC:   regular, every 3 minutes SVE:   Dilation: 6.5 Effacement (%): 50 Station: -1 Exam by:: Kamillah Didonato  Labs: Lab Results  Component Value Date   WBC 9.0 04/11/2012   HGB 13.6 04/11/2012   HCT 41.2 04/11/2012   MCV 90.9 04/11/2012   PLT 308 04/11/2012    Assessment / Plan: AROM, light mec.  Continue titrating pitocin.  Category 1 tracing.  Toba Claudio JEHIEL 04/12/2012, 2:29 PM

## 2012-04-12 NOTE — Anesthesia Preprocedure Evaluation (Signed)
Anesthesia Evaluation  Patient identified by MRN, date of birth, ID band Patient awake    Reviewed: Allergy & Precautions, H&P , NPO status , Patient's Chart, lab work & pertinent test results, reviewed documented beta blocker date and time   History of Anesthesia Complications Negative for: history of anesthetic complications  Airway Mallampati: III TM Distance: >3 FB Neck ROM: full  Mouth opening: Limited Mouth Opening  Dental  (+) Teeth Intact   Pulmonary neg pulmonary ROS,  breath sounds clear to auscultation        Cardiovascular negative cardio ROS  Rhythm:regular Rate:Normal     Neuro/Psych negative neurological ROS  negative psych ROS   GI/Hepatic negative GI ROS, Neg liver ROS,   Endo/Other  Diabetes mellitus-, Gestational, Oral Hypoglycemic Agents  Renal/GU negative Renal ROS  negative genitourinary   Musculoskeletal   Abdominal   Peds  Hematology negative hematology ROS (+)   Anesthesia Other Findings   Reproductive/Obstetrics (+) Pregnancy                           Anesthesia Physical Anesthesia Plan  ASA: III  Anesthesia Plan: Epidural   Post-op Pain Management:    Induction:   Airway Management Planned:   Additional Equipment:   Intra-op Plan:   Post-operative Plan:   Informed Consent: I have reviewed the patients History and Physical, chart, labs and discussed the procedure including the risks, benefits and alternatives for the proposed anesthesia with the patient or authorized representative who has indicated his/her understanding and acceptance.     Plan Discussed with:   Anesthesia Plan Comments:         Anesthesia Quick Evaluation

## 2012-04-12 NOTE — Progress Notes (Signed)
Patient getting up to shower, blood sugar checked, IV covered for shower. Monitor off.

## 2012-04-12 NOTE — Progress Notes (Signed)
   Monica Hancock is a 31 y.o. [phone] at [redacted]w[redacted]d  admitted for induction of labor due to Gestational diabetes.  Subjective: No complaints  Objective: BP 91/52  Pulse 72  Temp(Src) 98.5 F (36.9 C) (Oral)  Resp 20  Ht 5' (1.524 m)  Wt 73.936 kg (163 lb)  BMI 31.83 kg/m2  LMP 07/13/2011   Blood sugar: FHT:  Not on EFM now Foley in vagina; removed. CX 5/long-3  Labs: Lab Results  Component Value Date   WBC 9.0 04/11/2012   HGB 13.6 04/11/2012   HCT 41.2 04/11/2012   MCV 90.9 04/11/2012   PLT 308 04/11/2012    Assessment / Plan: IOL for A2DM, ripening phase complete Pt wants to shower before starting Pitocin  Labor: ripening phase complete Fetal Wellbeing:  Category I at last NST Pain Control:  Labor support without medications Anticipated MOD:  NSVD  CRESENZO-DISHMAN,Hanni [place] 04/12/2012, 6:35 AM

## 2012-04-12 NOTE — Progress Notes (Signed)
Monica Hancock is a 31 y.o. [phone] at [redacted]w[redacted]d admitted for induction of labor due to Gestational diabetes.  Subjective: Pt requesting Ambien for sleep.  Foley remains in place  Objective: BP 109/60  Pulse 84  Temp(Src) 98.4 F (36.9 C) (Oral)  Resp 18  Ht 5' (1.524 m)  Wt 73.936 kg (163 lb)  BMI 31.83 kg/m2  LMP 07/13/2011      FHT:  FHR: 120-150 bpm, variability: moderate,  accelerations:  Present,  decelerations:  Absent UC:   irregular, every 2-5 minutes SVE:   Dilation: 1 Effacement (%): Thick Station: -3 Exam by:: F.Cresenzo-dishmon,CNM  Labs: Lab Results  Component Value Date   WBC 9.0 04/11/2012   HGB 13.6 04/11/2012   HCT 41.2 04/11/2012   MCV 90.9 04/11/2012   PLT 308 04/11/2012    Assessment / Plan: Induction of labor due to gestational diabetes,  progressing well on pitocin  Labor: Progressing normally Preeclampsia:  n/a Fetal Wellbeing:  Category I Pain Control:  Labor support without medications I/D:  n/a Anticipated MOD:  NSVD   Andrena Mews, DO [facility] Resident - PGY-1 04/12/2012 2:15 AM

## 2012-04-12 NOTE — Anesthesia Procedure Notes (Signed)
Epidural Patient location during procedure: OB Start time: 04/12/2012 10:01 AM Reason for block: procedure for pain  Staffing Performed by: anesthesiologist   Preanesthetic Checklist Completed: patient identified, site marked, surgical consent, pre-op evaluation, timeout performed, IV checked, risks and benefits discussed and monitors and equipment checked  Epidural Patient position: sitting Prep: site prepped and draped and DuraPrep Patient monitoring: continuous pulse ox and blood pressure Approach: midline Injection technique: LOR air  Needle:  Needle type: Tuohy  Needle gauge: 17 G Needle length: 9 cm Needle insertion depth: 5 cm cm Catheter type: closed end flexible Catheter size: 19 Gauge Catheter at skin depth: 10 cm Test dose: negative  Assessment Events: blood not aspirated, injection not painful, no injection resistance, negative IV test and no paresthesia  Additional Notes Discussed risk of headache, infection, bleeding, nerve injury and failed or incomplete block.  Patient voices understanding and wishes to proceed.

## 2012-04-12 NOTE — Progress Notes (Signed)
  Subjective: Feels occasional contractions.    Objective: BP 91/52  Pulse 72  Temp(Src) 98.5 F (36.9 C) (Oral)  Resp 20  Ht 5' (1.524 m)  Wt 73.936 kg (163 lb)  BMI 31.83 kg/m2  LMP 07/13/2011      FHT:  FHR: 120 bpm, variability: moderate,  accelerations:  Present,  decelerations:  Absent UC:   irregular, every 3-7 minutes SVE:   Dilation: 5 Effacement (%): 50 Station: -2 Exam by:: Alix Stowers  Labs: Lab Results  Component Value Date   WBC 9.0 04/11/2012   HGB 13.6 04/11/2012   HCT 41.2 04/11/2012   MCV 90.9 04/11/2012   PLT 308 04/11/2012    Assessment / Plan: Start pitocin.  Category 1 tracing.  Expect vaginal delivery.  Lorence Nagengast JEHIEL 04/12/2012, 9:03 AM

## 2012-04-13 LAB — CBC
Hemoglobin: 11.2 g/dL — ABNORMAL LOW (ref 12.0–15.0)
RBC: 3.74 MIL/uL — ABNORMAL LOW (ref 3.87–5.11)

## 2012-04-13 MED ORDER — RHO D IMMUNE GLOBULIN 1500 UNIT/2ML IJ SOLN
300.0000 ug | Freq: Once | INTRAMUSCULAR | Status: AC
  Administered 2012-04-13: 300 ug via INTRAMUSCULAR
  Filled 2012-04-13: qty 2

## 2012-04-13 NOTE — Progress Notes (Signed)
UR Chart review completed.  

## 2012-04-13 NOTE — Anesthesia Postprocedure Evaluation (Signed)
  Anesthesia Post-op Note  Patient: Monica Hancock  Procedure(s) Performed: * No surgery found *  Patient Location: Mother/Baby  Anesthesia Type: Epidural  Level of Consciousness: awake  Airway and Oxygen Therapy: Patient Spontanous Breathing  Post-op Pain: none  Post-op Assessment: Patient's Cardiovascular Status Stable and Respiratory Function Stable  Post-op Vital Signs: Reviewed and stable  Complications: No apparent anesthesia complications

## 2012-04-13 NOTE — Progress Notes (Addendum)
Post Partum Day #1 Subjective: no complaints, up ad lib, voiding, tolerating PO and + flatus  Objective: Blood pressure 88/50, pulse 62, temperature 98.4 F (36.9 C), temperature source Oral, resp. rate 18, height 5' (1.524 m), weight 73.936 kg (163 lb), last menstrual period 07/13/2011, SpO2 97.00%, unknown if currently breastfeeding.  Physical Exam:  General: alert, no distress and up walking in room Lochia: appropriate Uterine Fundus: firm Incision: n/a DVT Evaluation: No evidence of DVT seen on physical exam.   Basename 04/13/12 0520 04/11/12 2100  HGB 11.2* 13.6  HCT 34.1* 41.2    Assessment/Plan: Plan for discharge tomorrow and Contraception Discussed with Dr. Tyrell Antonio; suggesting minipill   LOS: 2 days   RIGBY, MICHAEL 04/13/2012, 7:17 AM   I examined pt and agree with documentation above and resident plan of care.  Also discussed with patient the possibility of discharge today.  Pt will consider if infant is discharged.  Desires Ibuprofen and Micronor if discharged. [facility]

## 2012-04-14 LAB — RH IG WORKUP (INCLUDES ABO/RH)
ABO/RH(D): O NEG
Fetal Screen: NEGATIVE
Gestational Age(Wks): 39

## 2012-04-14 MED ORDER — IBUPROFEN 600 MG PO TABS: 600.0000 mg | ORAL_TABLET | Freq: Four times a day (QID) | ORAL | Status: AC

## 2012-04-14 MED ORDER — MEASLES, MUMPS & RUBELLA VAC ~~LOC~~ INJ
0.5000 mL | INJECTION | Freq: Once | SUBCUTANEOUS | Status: AC
  Administered 2012-04-14: 0.5 mL via SUBCUTANEOUS
  Filled 2012-04-14: qty 0.5

## 2012-04-14 NOTE — Discharge Summary (Signed)
I was present for the exam and agree with above.  [place], [place] 04/14/2012 7:48 AM

## 2012-04-14 NOTE — Discharge Summary (Addendum)
Obstetric Discharge Summary Reason for Admission: induction of labor and for gestational diabetes Prenatal Procedures: none Intrapartum Procedures: IOL with Pitocin and Foley Bulb Postpartum Procedures: none Complications-Operative and Postpartum: 1 degree perineal laceration Hemoglobin  Date Value Range Status  04/13/2012 11.2* 12.0-15.0 (g/dL) Final     REPEATED TO VERIFY     DELTA CHECK NOTED     HCT  Date Value Range Status  04/13/2012 34.1* 36.0-46.0 (%) Final    Physical Exam:  General: alert, cooperative and no distress Lochia: appropriate Uterine Fundus: firm Incision: n/a DVT Evaluation: No evidence of DVT seen on physical exam. Negative Homan's sign. No cords or calf tenderness. No significant calf/ankle edema.  Discharge Diagnoses: Term Pregnancy-delivered  Discharge Information: Date: 04/14/2012 Activity: pelvic rest Diet: routine Medications: Ibuprofen Condition: stable Instructions: refer to practice specific booklet Discharge to: home   Newborn Data: Live born female  Birth Weight: 6 lb 13.6 oz (3107 g) APGAR: 8, 9  Home with mother.  Andrena Mews, DO [facility] Resident - PGY-1 04/14/2012 7:45 AM

## 2012-04-14 NOTE — Discharge Instructions (Signed)
Vaginal Delivery Care After  Change your pad on each trip to the bathroom.   Wipe gently with toilet paper during your hospital stay. Always wipe from front to back. A spray bottle with warm tap water could also be used or a towelette if available.   Place your soiled pad and toilet paper in a bathroom wastebasket with a plastic bag liner.   During your hospital stay, save any clots. If you pass a clot while on the toilet, do not flush it. Also, if your vaginal flow seems excessive to you, notify nursing personnel.   The first time you get out of bed after delivery, wait for assistance from a nurse. Do not get up alone at any time if you feel weak or dizzy.   Bend and extend your ankles forcefully so that you feel the calves of your legs get hard. Do this 6 times every hour when you are in bed and awake.   Do not sit with one foot under you, dangle your legs over the edge of the bed, or maintain a position that hinders the circulation in your legs.   Many women experience after pains for 2 to 3 days after delivery. These after pains are mild uterine contractions. Ask the nurse for a pain medication if you need something for this. Sometimes breastfeeding stimulates after pains; if you find this to be true, ask for the medication  -  hour before the next feeding.   For you and your infant's protection, do not go beyond the door(s) of the obstetric unit. Do not carry your baby in your arms in the hallway. When taking your baby to and from your room, put your baby in the bassinet and push the bassinet.   Mothers may have their babies in their room as much as they desire.  Document Released: 11/04/2000 Document Revised: 10/27/2011 Document Reviewed: 10/05/2007 [employer] Patient Information 2012 [employer]. 

## 2013-02-04 ENCOUNTER — Emergency Department (HOSPITAL_COMMUNITY): Payer: Medicaid Other

## 2013-02-04 ENCOUNTER — Encounter (HOSPITAL_COMMUNITY): Payer: Self-pay | Admitting: *Deleted

## 2013-02-04 ENCOUNTER — Emergency Department (HOSPITAL_COMMUNITY)
Admission: EM | Admit: 2013-02-04 | Discharge: 2013-02-04 | Disposition: A | Discharge status: Home / Self Care | Payer: Medicaid Other | Attending: Emergency Medicine | Admitting: Emergency Medicine

## 2013-02-04 DIAGNOSIS — T07XXXA Unspecified multiple injuries, initial encounter: Secondary | ICD-10-CM | POA: Insufficient documentation

## 2013-02-04 DIAGNOSIS — Y9289 Other specified places as the place of occurrence of the external cause: Secondary | ICD-10-CM | POA: Insufficient documentation

## 2013-02-04 DIAGNOSIS — Z3202 Encounter for pregnancy test, result negative: Secondary | ICD-10-CM | POA: Insufficient documentation

## 2013-02-04 DIAGNOSIS — S79929A Unspecified injury of unspecified thigh, initial encounter: Secondary | ICD-10-CM | POA: Insufficient documentation

## 2013-02-04 DIAGNOSIS — Z8632 Personal history of gestational diabetes: Secondary | ICD-10-CM | POA: Insufficient documentation

## 2013-02-04 DIAGNOSIS — W010XXA Fall on same level from slipping, tripping and stumbling without subsequent striking against object, initial encounter: Secondary | ICD-10-CM | POA: Insufficient documentation

## 2013-02-04 DIAGNOSIS — IMO0002 Reserved for concepts with insufficient information to code with codable children: Secondary | ICD-10-CM | POA: Insufficient documentation

## 2013-02-04 DIAGNOSIS — S4980XA Other specified injuries of shoulder and upper arm, unspecified arm, initial encounter: Secondary | ICD-10-CM | POA: Insufficient documentation

## 2013-02-04 DIAGNOSIS — S79919A Unspecified injury of unspecified hip, initial encounter: Secondary | ICD-10-CM | POA: Insufficient documentation

## 2013-02-04 DIAGNOSIS — Y9301 Activity, walking, marching and hiking: Secondary | ICD-10-CM | POA: Insufficient documentation

## 2013-02-04 DIAGNOSIS — S46909A Unspecified injury of unspecified muscle, fascia and tendon at shoulder and upper arm level, unspecified arm, initial encounter: Secondary | ICD-10-CM | POA: Insufficient documentation

## 2013-02-04 MED ORDER — IBUPROFEN 600 MG PO TABS: 600.0000 mg | ORAL_TABLET | Freq: Once | ORAL | Status: DC

## 2013-02-04 MED ORDER — IBUPROFEN 800 MG PO TABS
800.0000 mg | ORAL_TABLET | Freq: Once | ORAL | Status: AC
  Administered 2013-02-04: 800 mg via ORAL
  Filled 2013-02-04: qty 1

## 2013-02-04 MED ORDER — HYDROCODONE-ACETAMINOPHEN 5-325 MG PO TABS: 1.0000 | ORAL_TABLET | ORAL | Status: DC | PRN

## 2013-02-04 NOTE — ED Notes (Signed)
Pt c/o lower back pain and left arm pain that began this morning after a fall. Pt states she slipped on steps and fell onto back and arm. No LOC.

## 2013-02-04 NOTE — ED Notes (Signed)
Slipped and feel this am on icy steps,  Fell down 2 steps.  Pain lt arm, and back pain.  NO loc.  No neck pain.

## 2013-02-04 NOTE — ED Provider Notes (Signed)
History     CSN: [id number]  Arrival date & time 02/04/13  1125   First MD Initiated Contact with Patient 02/04/13 1147      Chief Complaint  Patient presents with  . Fall    (Consider location/radiation/quality/duration/timing/severity/associated sxs/prior treatment) HPI Comments: Monica Hancock is a 32 y.o. Female presenting with pain in her lower back, right pelvic and left upper arm when she slipped on ice walking down the steps outside her home.  She denies head injury and dizziness and had no loc.  She is ambulatory and reports increased pain with attempts to sit and with palpation of the injury sites.  Pain is constant and sharp without radiation.  She has taken no medicines prior to arrival.      The history is provided by the patient.    Past Medical History  Diagnosis Date  . Gestational diabetes     History reviewed. No pertinent past surgical history.  Family History  Problem Relation Age of Onset  . Diabetes Mother   . Cancer Mother     breast  . Diabetes Maternal Grandmother     History  Substance Use Topics  . Smoking status: Never Smoker   . Smokeless tobacco: Never Used  . Alcohol Use: No    OB History   Grav Para Term Preterm Abortions TAB SAB Ect Mult Living   4 4 3 1  0 0 0 0 0 4      Review of Systems  Constitutional: Negative for fever.  HENT: Negative for congestion, sore throat and neck pain.   Eyes: Negative.   Respiratory: Negative for chest tightness and shortness of breath.   Cardiovascular: Negative for chest pain.  Gastrointestinal: Negative for nausea and abdominal pain.  Genitourinary: Negative.   Musculoskeletal: Positive for back pain and arthralgias. Negative for myalgias, joint swelling and gait problem.  Skin: Negative.  Negative for rash and wound.  Neurological: Negative for dizziness, weakness, light-headedness, numbness and headaches.  Psychiatric/Behavioral: Negative.     Allergies  Review of patient's  allergies indicates no known allergies.  Home Medications   Current Outpatient Rx  Name  Route  Sig  Dispense  Refill  . Prenatal Vit-DSS-Fe Cbn-FA (PRENATAL AD PO)   Oral   Take 1 tablet by mouth daily.         Marland Kitchen PRESCRIPTION MEDICATION   Oral   Take 1 tablet by mouth daily. Birth Control         . HYDROcodone-acetaminophen (NORCO/VICODIN) 5-325 MG per tablet   Oral   Take 1 tablet by mouth every 4 (four) hours as needed for pain.   12 tablet   0   . ibuprofen (ADVIL,MOTRIN) 600 MG tablet   Oral   Take 1 tablet (600 mg total) by mouth once.   15 tablet   0     BP 108/67  Pulse 66  Temp(Src) 97.9 F (36.6 C) (Oral)  Resp 18  Ht 5' (1.524 m)  Wt 151 lb (68.493 kg)  BMI 29.49 kg/m2  SpO2 100%  LMP 01/28/2013  Breastfeeding? Yes  Physical Exam  Nursing note and vitals reviewed. Constitutional: She appears well-developed and well-nourished.  HENT:  Head: Normocephalic.  Eyes: Conjunctivae are normal.  Neck: Normal range of motion. Neck supple.  Cardiovascular: Normal rate and intact distal pulses.   Pulses:      Radial pulses are 2+ on the right side, and 2+ on the left side.  Pulmonary/Chest: Effort normal.  Abdominal:  Soft. Bowel sounds are normal. She exhibits no distension and no mass.  Musculoskeletal: Normal range of motion. She exhibits tenderness. She exhibits no edema.       Lumbar back: She exhibits tenderness. She exhibits no swelling, no edema and no spasm.  No midline lumbar ttp.  Tender across posterior right pelvic rim.  No step offs,  Deformity and no edema or ecchymosis.  Hip joints are located with no increased pain with hip flexion.  She does have ttp mid left humerus with no edema, ecchymosis or deformity.  No pain with flexion of left elbow and wrist.  Neurological: She is alert. She has normal strength. She displays no atrophy and no tremor. No sensory deficit. Gait normal.  Reflex Scores:      Bicep reflexes are 2+ on the right side and  2+ on the left side.      Patellar reflexes are 2+ on the right side and 2+ on the left side. No strength deficit noted in hip and knee flexor and extensor muscle groups.  Ankle flexion and extension intact.  Skin: Skin is warm and dry.  Psychiatric: She has a normal mood and affect.    ED Course  Procedures (including critical care time)  Labs Reviewed  POCT PREGNANCY, URINE   Dg Lumbar Spine Complete  02/04/2013  *RADIOLOGY REPORT*  Clinical Data: Fall, lower back pain  LUMBAR SPINE - COMPLETE 4+ VIEW  Comparison: None.  Findings: Five lumbar-type vertebral bodies.  Normal lumbar lordosis.  No evidence of fracture or dislocation.  Vertebral body heights and intervertebral disc spaces are maintained.  Visualized bony pelvis appears intact.  IMPRESSION: Normal lumbar spine radiographs.   Original Report Authenticated By: Charline Bills, M.D.    Dg Pelvis 1-2 Views  02/04/2013  *RADIOLOGY REPORT*  Clinical Data: Fall, lower back/buttock pain  PELVIS - 1-2 VIEW  Comparison: None.  Findings: No fracture or dislocation is seen.  Visualized bony pelvis appears intact.  Bilateral hip joint spaces are preserved.  Lower lumbar spine is within normal limits.  IMPRESSION: No fracture or dislocation is seen.   Original Report Authenticated By: Charline Bills, M.D.    Dg Humerus Left  02/04/2013  *RADIOLOGY REPORT*  Clinical Data: Fall, left arm pain  LEFT HUMERUS - 2+ VIEW  Comparison: None.  Findings: No fracture or dislocation is seen.  Visualized soft tissues are unremarkable.  IMPRESSION: No fracture or dislocation is seen.   Original Report Authenticated By: Charline Bills, M.D.      1. Contusion of multiple sites       MDM  Patients labs and/or radiological studies were viewed and considered during the medical decision making and disposition process.   Discussed ice therapy x 2 days,  Adding heat on day 3.  Ibuprofen,  May take hydrocodone, but caution advised re breast feeding.   F.u with pcp for recheck prn.  The patient appears reasonably screened and/or stabilized for discharge and I doubt any other medical condition or other [facility] requiring further screening, evaluation, or treatment in the ED at this time prior to discharge.         Burgess Amor, PA-C 02/04/13 1337

## 2013-02-05 NOTE — ED Provider Notes (Signed)
Medical screening examination/treatment/procedure(s) were performed by non-physician practitioner and as supervising physician I was immediately available for consultation/collaboration.  Donnetta Hutching, MD 02/05/13 [phone]

## 2013-07-28 ENCOUNTER — Encounter (HOSPITAL_COMMUNITY): Payer: Self-pay | Admitting: Emergency Medicine

## 2013-07-28 ENCOUNTER — Emergency Department (HOSPITAL_COMMUNITY)
Admission: EM | Admit: 2013-07-28 | Discharge: 2013-07-28 | Disposition: A | Discharge status: Home / Self Care | Payer: Medicaid Other | Attending: Emergency Medicine | Admitting: Emergency Medicine

## 2013-07-28 ENCOUNTER — Emergency Department (HOSPITAL_COMMUNITY): Payer: Medicaid Other

## 2013-07-28 DIAGNOSIS — R079 Chest pain, unspecified: Secondary | ICD-10-CM

## 2013-07-28 DIAGNOSIS — IMO0001 Reserved for inherently not codable concepts without codable children: Secondary | ICD-10-CM | POA: Insufficient documentation

## 2013-07-28 DIAGNOSIS — Z8632 Personal history of gestational diabetes: Secondary | ICD-10-CM | POA: Insufficient documentation

## 2013-07-28 DIAGNOSIS — R0789 Other chest pain: Secondary | ICD-10-CM | POA: Insufficient documentation

## 2013-07-28 LAB — CBC WITH DIFFERENTIAL/PLATELET
Eosinophils Absolute: 0.1 10*3/uL (ref 0.0–0.7)
Eosinophils Relative: 2 % (ref 0–5)
Hemoglobin: 14.3 g/dL (ref 12.0–15.0)
Lymphs Abs: 4.3 10*3/uL — ABNORMAL HIGH (ref 0.7–4.0)
MCH: 29.7 pg (ref 26.0–34.0)
MCV: 87.8 fL (ref 78.0–100.0)
Monocytes Relative: 6 % (ref 3–12)
RBC: 4.82 MIL/uL (ref 3.87–5.11)

## 2013-07-28 LAB — TROPONIN I: Troponin I: <0.3 ng/mL (ref <0.30)

## 2013-07-28 LAB — COMPREHENSIVE METABOLIC PANEL
Alkaline Phosphatase: 95 U/L (ref 39–117)
BUN: 10 mg/dL (ref 6–23)
Calcium: 10.8 mg/dL — ABNORMAL HIGH (ref 8.4–10.5)
GFR calc Af Amer: >90 mL/min (ref >90)
GFR calc non Af Amer: >90 mL/min (ref >90)
Glucose, Bld: 75 mg/dL (ref 70–99)
Potassium: 3.9 mEq/L (ref 3.5–5.1)
Total Protein: 9.1 g/dL — ABNORMAL HIGH (ref 6.0–8.3)

## 2013-07-28 MED ORDER — KETOROLAC TROMETHAMINE 30 MG/ML IJ SOLN
30.0000 mg | Freq: Once | INTRAMUSCULAR | Status: AC
  Administered 2013-07-28: 30 mg via INTRAVENOUS
  Filled 2013-07-28: qty 1

## 2013-07-28 NOTE — ED Provider Notes (Signed)
CSN: [id number]     Arrival date & time 07/28/13  1153 History   This chart was scribed for Geoffery Lyons, MD, by Yevette Edwards, ED Scribe. This patient was seen in room APA02/APA02 and the patient's care was started at 3:03 PM. First MD Initiated Contact with Patient 07/28/13 1502     Chief Complaint  Patient presents with  . Chest Pain   (Consider location/radiation/quality/duration/timing/severity/associated sxs/prior Treatment) The history is provided by the patient. No language interpreter was used.   HPI Comments: GISSELE Hancock is a 32 y.o. non-smoking female who presents to the Emergency Department complaining of intermittent, mid-sternal chest pain which began yesterday evening. She states the episodes last 10-15 minutes when they occur, and that she is now experiencing radiating pain to her left arm. The pt reports that nothing seems to catalyze the pain, and nothing seems to lessen the pain. She denies any nausea, emesis, abdominal pain, lightheadedness, dizziness, or diaphoresis. She also denies increased chest pain with exertion or with eating. She denies a h/o similar symptoms. She denies a family history of cardiac issues.   Past Medical History  Diagnosis Date  . Gestational diabetes    History reviewed. No pertinent past surgical history. Family History  Problem Relation Age of Onset  . Diabetes Mother   . Cancer Mother     breast  . Diabetes Maternal Grandmother    History  Substance Use Topics  . Smoking status: Never Smoker   . Smokeless tobacco: Never Used  . Alcohol Use: No   OB History   Grav Para Term Preterm Abortions TAB SAB Ect Mult Living   4 4 3 1  0 0 0 0 0 4     Review of Systems  Constitutional: Negative for fever and diaphoresis.  Respiratory: Negative for shortness of breath.   Cardiovascular: Positive for chest pain.  Gastrointestinal: Negative for nausea, vomiting and abdominal pain.  Musculoskeletal: Positive for myalgias (Left arm  pain.).  Neurological: Negative for dizziness and light-headedness.  All other systems reviewed and are negative.    Allergies  Review of patient's allergies indicates no known allergies.  Home Medications  No current outpatient prescriptions on file.  Triage Vitals: BP 124/84  Pulse 75  Temp(Src) 98.1 F (36.7 C) (Oral)  Resp 18  SpO2 100% Physical Exam  Nursing note and vitals reviewed. Constitutional: She is oriented to person, place, and time. She appears well-developed and well-nourished. No distress.  HENT:  Head: Normocephalic and atraumatic.  Eyes: EOM are normal.  Neck: Normal range of motion. Neck supple. No tracheal deviation present.  Cardiovascular: Normal rate, regular rhythm and normal heart sounds.   No murmur heard. Pulmonary/Chest: Effort normal and breath sounds normal. No respiratory distress. She has no wheezes.  There is tenderness to palpation over the midsternum that seems to reproduce her pain.   Abdominal: Soft. Bowel sounds are normal.  Musculoskeletal: Normal range of motion.  Neurological: She is alert and oriented to person, place, and time.  Skin: Skin is warm and dry.  Psychiatric: She has a normal mood and affect. Her behavior is normal.    ED Course  Procedures (including critical care time)  DIAGNOSTIC STUDIES: Oxygen Saturation is 100% on room air, normal by my interpretation.    COORDINATION OF CARE:  3:11 PM- Discussed treatment plan with patient, and the patient agreed to the plan.   Labs Review Labs Reviewed  CBC WITH DIFFERENTIAL - Abnormal; Notable for the following:  Neutrophils Relative % 38 (*)    Lymphocytes Relative 54 (*)    Lymphs Abs 4.3 (*)    All other components within normal limits  COMPREHENSIVE METABOLIC PANEL - Abnormal; Notable for the following:    Calcium 10.8 (*)    Total Protein 9.1 (*)    All other components within normal limits  TROPONIN I   Imaging Review No results found.   Date:  07/28/2013  Rate: 70  Rhythm: normal sinus rhythm  QRS Axis: normal  Intervals: normal  ST/T Wave abnormalities: normal  Conduction Disutrbances:none  Narrative Interpretation:   Old EKG Reviewed: none available    MDM  No diagnosis found. Patient with no prior cardiac history presents to the emergency department with complaints of discomfort in the center of her chest that started last night while getting ready for bed. She denies feeling short of breath, dizziness, nausea, or radiation to the jaw. She does state that her left arm is uncomfortable this morning. She denies any recent exertional symptoms and denies any injuries. Physical examination reveals tenderness to palpation over the anterior chest wall which seems to reproduce her symptoms. The workup was negative including EKG and troponin. Patient has no risk factors and her symptoms are atypical for cardiac pain and I doubt a cardiac etiology. She will be discharged to home with recommendations to take NSAIDs and followup when necessary.  I personally performed the services described in this documentation, which was scribed in my presence. The recorded information has been reviewed and is accurate.        Geoffery Lyons, MD 07/28/13 [phone]

## 2013-07-28 NOTE — ED Notes (Signed)
Pt c/o centralized chest pain which states is intermittent and began last night when she was getting ready for bed. Pt denies diaphoresis, SOB, lightheadedness, dizziness. Pt states this morning pain radiated to her left arm. Pt also denies nausea.

## 2014-07-29 ENCOUNTER — Other Ambulatory Visit: Payer: Self-pay | Admitting: Obstetrics & Gynecology

## 2014-07-29 DIAGNOSIS — O3680X0 Pregnancy with inconclusive fetal viability, not applicable or unspecified: Secondary | ICD-10-CM

## 2014-07-31 ENCOUNTER — Other Ambulatory Visit: Payer: Self-pay | Admitting: Obstetrics & Gynecology

## 2014-07-31 ENCOUNTER — Ambulatory Visit (INDEPENDENT_AMBULATORY_CARE_PROVIDER_SITE_OTHER): Payer: Medicaid Other

## 2014-07-31 ENCOUNTER — Other Ambulatory Visit: Payer: Self-pay | Admitting: *Deleted

## 2014-07-31 DIAGNOSIS — O09529 Supervision of elderly multigravida, unspecified trimester: Secondary | ICD-10-CM

## 2014-07-31 DIAGNOSIS — O09521 Supervision of elderly multigravida, first trimester: Secondary | ICD-10-CM

## 2014-07-31 DIAGNOSIS — O3680X0 Pregnancy with inconclusive fetal viability, not applicable or unspecified: Secondary | ICD-10-CM

## 2014-07-31 MED ORDER — DOXYLAMINE-PYRIDOXINE 10-10 MG PO TBEC: 10.0000 mg | DELAYED_RELEASE_TABLET | Freq: Three times a day (TID) | ORAL | Status: DC

## 2014-07-31 NOTE — Progress Notes (Signed)
U/S(8+5wks)-single IUP with +FCA noted, FHR-170 bpm, cx appears closed, bilateral adnexa appears WNL with C.L. Noted on RT, CRL c/w LMP dates

## 2014-08-06 ENCOUNTER — Ambulatory Visit (INDEPENDENT_AMBULATORY_CARE_PROVIDER_SITE_OTHER): Payer: Medicaid Other | Admitting: Women's Health

## 2014-08-06 ENCOUNTER — Encounter: Payer: Self-pay | Admitting: Women's Health

## 2014-08-06 VITALS — BP 112/64 | Wt 157.0 lb

## 2014-08-06 DIAGNOSIS — Z331 Pregnant state, incidental: Secondary | ICD-10-CM

## 2014-08-06 DIAGNOSIS — Z8632 Personal history of gestational diabetes: Secondary | ICD-10-CM

## 2014-08-06 DIAGNOSIS — Z0189 Encounter for other specified special examinations: Secondary | ICD-10-CM

## 2014-08-06 DIAGNOSIS — O09291 Supervision of pregnancy with other poor reproductive or obstetric history, first trimester: Secondary | ICD-10-CM

## 2014-08-06 DIAGNOSIS — O09299 Supervision of pregnancy with other poor reproductive or obstetric history, unspecified trimester: Secondary | ICD-10-CM | POA: Insufficient documentation

## 2014-08-06 DIAGNOSIS — O09899 Supervision of other high risk pregnancies, unspecified trimester: Secondary | ICD-10-CM | POA: Insufficient documentation

## 2014-08-06 DIAGNOSIS — Z3481 Encounter for supervision of other normal pregnancy, first trimester: Secondary | ICD-10-CM

## 2014-08-06 DIAGNOSIS — Z36 Encounter for antenatal screening of mother: Secondary | ICD-10-CM

## 2014-08-06 DIAGNOSIS — O09211 Supervision of pregnancy with history of pre-term labor, first trimester: Secondary | ICD-10-CM

## 2014-08-06 DIAGNOSIS — Z1389 Encounter for screening for other disorder: Secondary | ICD-10-CM

## 2014-08-06 DIAGNOSIS — O360111 Maternal care for anti-D [Rh] antibodies, first trimester, fetus 1: Secondary | ICD-10-CM

## 2014-08-06 DIAGNOSIS — Z1159 Encounter for screening for other viral diseases: Secondary | ICD-10-CM

## 2014-08-06 DIAGNOSIS — O09891 Supervision of other high risk pregnancies, first trimester: Secondary | ICD-10-CM

## 2014-08-06 DIAGNOSIS — Z0184 Encounter for antibody response examination: Secondary | ICD-10-CM

## 2014-08-06 DIAGNOSIS — Z3480 Encounter for supervision of other normal pregnancy, unspecified trimester: Secondary | ICD-10-CM

## 2014-08-06 DIAGNOSIS — O26899 Other specified pregnancy related conditions, unspecified trimester: Secondary | ICD-10-CM

## 2014-08-06 DIAGNOSIS — Z130 Encounter for screening for diseases of the blood and blood-forming organs and certain disorders involving the immune mechanism: Secondary | ICD-10-CM

## 2014-08-06 DIAGNOSIS — O09219 Supervision of pregnancy with history of pre-term labor, unspecified trimester: Secondary | ICD-10-CM

## 2014-08-06 DIAGNOSIS — Z1371 Encounter for nonprocreative screening for genetic disease carrier status: Secondary | ICD-10-CM

## 2014-08-06 DIAGNOSIS — Z6791 Unspecified blood type, Rh negative: Secondary | ICD-10-CM | POA: Insufficient documentation

## 2014-08-06 LAB — POCT URINALYSIS DIPSTICK
Glucose, UA: NEGATIVE
Ketones, UA: NEGATIVE
Leukocytes, UA: NEGATIVE
NITRITE UA: NEGATIVE
Protein, UA: NEGATIVE
RBC UA: NEGATIVE

## 2014-08-06 MED ORDER — DOXYLAMINE-PYRIDOXINE 10-10 MG PO TBEC: DELAYED_RELEASE_TABLET | ORAL | Status: DC

## 2014-08-06 MED ORDER — PRENATAL 27-0.8 MG PO TABS: 1.0000 | ORAL_TABLET | Freq: Every day | ORAL | Status: DC

## 2014-08-06 NOTE — Addendum Note (Signed)
Addended by: Traci Sermon A on: 08/06/2014 11:00 AM   Modules accepted: Orders

## 2014-08-06 NOTE — Progress Notes (Signed)
  Subjective:  Monica Hancock is a 33 y.o. [phone] Hispanic female at [redacted]w[redacted]d by LMP c/w 8wk u/s, being seen today for her first obstetrical visit.  Her obstetrical history is significant for spontaneous PTB @ 36wks w/ 1st pregnancy, A2DM last pregnancy, Rh neg.  Pregnancy history fully reviewed.  Patient reports diclegis helping w/ nausea- needs refill. Denies vb, cramping, uti s/s, abnormal/malodorous vag d/c, or vulvovaginal itching/irritation.  BP 112/64  Wt 157 lb (71.215 kg)  LMP 05/31/2014  HISTORY: OB History  Gravida Para Term Preterm AB SAB TAB Ectopic Multiple Living  5 4 3 1  0 0 0 0 0 4    # Outcome Date GA Lbr Len/2nd Weight Sex Delivery Anes PTL Lv  5 CUR           4 TRM 04/12/12 [redacted]w[redacted]d 07:41 / 01:07 6 lb 13.6 oz (3.107 kg) F SVD EPI N Y  3 TRM 2009 [redacted]w[redacted]d 12:00 7 lb 8 oz (3.402 kg) M SVD  N Y  2 TRM 2007 [redacted]w[redacted]d 24:00 7 lb 6 oz (3.345 kg) F SVD  N Y  1 PRE 1993 [redacted]w[redacted]d 05:00 5 lb 4 oz (2.381 kg) M SVD  Y Y     Past Medical History  Diagnosis Date  . Gestational diabetes    Past Surgical History  Procedure Laterality Date  . No past surgeries     Family History  Problem Relation Age of Onset  . Diabetes Mother   . Cancer Mother     breast  . Diabetes Maternal Grandmother   . Cancer Maternal Grandfather     Exam   System:     General: Well developed & nourished, no acute distress   Skin: Warm & dry, normal coloration and turgor, no rashes   Neurologic: Alert & oriented, normal mood   Cardiovascular: Regular rate & rhythm   Respiratory: Effort & rate normal, LCTAB, acyanotic   Abdomen: Soft, non tender   Extremities: normal strength, tone   Pelvic Exam:    Perineum: Normal perineum   Vulva: Normal, no lesions   Vagina:  Normal mucosa, normal discharge   Cervix: Normal, bulbous, appears closed   Uterus: Normal size/shape/contour for GA   Thin prep pap smear: April 2015 neg [place]   FHR: 160s via informal transabdominal u/s   Assessment:    Pregnancy: [id number] Patient Active Problem List   Diagnosis Date Noted  . Supervision of other normal pregnancy 08/06/2014    Priority: High  . Rh negative state in antepartum period 08/06/2014    Priority: High  . History of gestational diabetes in prior pregnancy, currently pregnant 08/06/2014    Priority: High    [redacted]w[redacted]d [id number] New OB visit H/O 36wk PTB H/O A2DM Nausea pregnancy    Plan:  Initial labs drawn Continue prenatal vitamins Problem list reviewed and updated Reviewed n/v relief measures and warning s/s to report Reviewed recommended weight gain based on pre-gravid BMI Encouraged well-balanced diet Genetic Screening discussed Integrated Screen: requested Cystic fibrosis screening discussed requested Ultrasound discussed; fetal survey: requested Follow up asap for early 2hr gtt, then in 3 weeks for 1st it/nt and visit Offered [place], accepted, ordered- to begin at [place] completed  Tawnya Crook CNM, [facility] 08/06/2014 9:11 AM

## 2014-08-06 NOTE — Patient Instructions (Addendum)
You will have your sugar test next visit.  Please do not eat or drink anything after midnight the night before you come, not even water.  You will be here for at least two hours.     Nausea & Vomiting  Have saltine crackers or pretzels by your bed and eat a few bites before you raise your head out of bed in the morning  Eat small frequent meals throughout the day instead of large meals  Drink plenty of fluids throughout the day to stay hydrated, just don't drink a lot of fluids with your meals.  This can make your stomach fill up faster making you feel sick  Do not brush your teeth right after you eat  Products with real ginger are good for nausea, like ginger ale and ginger hard candy Make sure it says made with real ginger!  Sucking on sour candy like lemon heads is also good for nausea  If your prenatal vitamins make you nauseated, take them at night so you will sleep through the nausea  Sea Bands  If you feel like you need medicine for the nausea & vomiting please let us know  If you are unable to keep any fluids or food down please let us know    First Trimester of Pregnancy The first trimester of pregnancy is from week 1 until the end of week 12 (months 1 through 3). A week after a sperm fertilizes an egg, the egg will implant on the wall of the uterus. This embryo will begin to develop into a baby. Genes from you and your partner are forming the baby. The female genes determine whether the baby is a boy or a girl. At 6-8 weeks, the eyes and face are formed, and the heartbeat can be seen on ultrasound. At the end of 12 weeks, all the baby's organs are formed.  Now that you are pregnant, you will want to do everything you can to have a healthy baby. Two of the most important things are to get good prenatal care and to follow your health care provider's instructions. Prenatal care is all the medical care you receive before the baby's birth. This care will help prevent, find, and treat  any problems during the pregnancy and childbirth. BODY CHANGES Your body goes through many changes during pregnancy. The changes vary from woman to woman.   You may gain or lose a couple of pounds at first.  You may feel sick to your stomach (nauseous) and throw up (vomit). If the vomiting is uncontrollable, call your health care provider.  You may tire easily.  You may develop headaches that can be relieved by medicines approved by your health care provider.  You may urinate more often. Painful urination may mean you have a bladder infection.  You may develop heartburn as a result of your pregnancy.  You may develop constipation because certain hormones are causing the muscles that push waste through your intestines to slow down.  You may develop hemorrhoids or swollen, bulging veins (varicose veins).  Your breasts may begin to grow larger and become tender. Your nipples may stick out more, and the tissue that surrounds them (areola) may become darker.  Your gums may bleed and may be sensitive to brushing and flossing.  Dark spots or blotches (chloasma, mask of pregnancy) may develop on your face. This will likely fade after the baby is born.  Your menstrual periods will stop.  You may have a loss of appetite.  You may develop cravings for certain kinds of food.  You may have changes in your emotions from day to day, such as being excited to be pregnant or being concerned that something may go wrong with the pregnancy and baby.  You may have more vivid and strange dreams.  You may have changes in your hair. These can include thickening of your hair, rapid growth, and changes in texture. Some women also have hair loss during or after pregnancy, or hair that feels dry or thin. Your hair will most likely return to normal after your baby is born. WHAT TO EXPECT AT YOUR PRENATAL VISITS During a routine prenatal visit:  You will be weighed to make sure you and the baby are growing  normally.  Your blood pressure will be taken.  Your abdomen will be measured to track your baby's growth.  The fetal heartbeat will be listened to starting around week 10 or 12 of your pregnancy.  Test results from any previous visits will be discussed. Your health care provider may ask you:  How you are feeling.  If you are feeling the baby move.  If you have had any abnormal symptoms, such as leaking fluid, bleeding, severe headaches, or abdominal cramping.  If you have any questions. Other tests that may be performed during your first trimester include:  Blood tests to find your blood type and to check for the presence of any previous infections. They will also be used to check for low iron levels (anemia) and Rh antibodies. Later in the pregnancy, blood tests for diabetes will be done along with other tests if problems develop.  Urine tests to check for infections, diabetes, or protein in the urine.  An ultrasound to confirm the proper growth and development of the baby.  An amniocentesis to check for possible genetic problems.  Fetal screens for spina bifida and Down syndrome.  You may need other tests to make sure you and the baby are doing well. HOME CARE INSTRUCTIONS  Medicines  Follow your health care provider's instructions regarding medicine use. Specific medicines may be either safe or unsafe to take during pregnancy.  Take your prenatal vitamins as directed.  If you develop constipation, try taking a stool softener if your health care provider approves. Diet  Eat regular, well-balanced meals. Choose a variety of foods, such as meat or vegetable-based protein, fish, milk and low-fat dairy products, vegetables, fruits, and whole grain breads and cereals. Your health care provider will help you determine the amount of weight gain that is right for you.  Avoid raw meat and uncooked cheese. These carry germs that can cause birth defects in the baby.  Eating four  or five small meals rather than three large meals a day may help relieve nausea and vomiting. If you start to feel nauseous, eating a few soda crackers can be helpful. Drinking liquids between meals instead of during meals also seems to help nausea and vomiting.  If you develop constipation, eat more high-fiber foods, such as fresh vegetables or fruit and whole grains. Drink enough fluids to keep your urine clear or pale yellow. Activity and Exercise  Exercise only as directed by your health care provider. Exercising will help you:  Control your weight.  Stay in shape.  Be prepared for labor and delivery.  Experiencing pain or cramping in the lower abdomen or low back is a good sign that you should stop exercising. Check with your health care provider before continuing normal exercises.  Try to avoid standing for long periods of time. Move your legs often if you must stand in one place for a long time.  Avoid heavy lifting.  Wear low-heeled shoes, and practice good posture.  You may continue to have sex unless your health care provider directs you otherwise. Relief of Pain or Discomfort  Wear a good support bra for breast tenderness.   Take warm sitz baths to soothe any pain or discomfort caused by hemorrhoids. Use hemorrhoid cream if your health care provider approves.   Rest with your legs elevated if you have leg cramps or low back pain.  If you develop varicose veins in your legs, wear support hose. Elevate your feet for 15 minutes, 3-4 times a day. Limit salt in your diet. Prenatal Care  Schedule your prenatal visits by the twelfth week of pregnancy. They are usually scheduled monthly at first, then more often in the last 2 months before delivery.  Write down your questions. Take them to your prenatal visits.  Keep all your prenatal visits as directed by your health care provider. Safety  Wear your seat belt at all times when driving.  Make a list of emergency phone  numbers, including numbers for family, friends, the hospital, and police and fire departments. General Tips  Ask your health care provider for a referral to a local prenatal education class. Begin classes no later than at the beginning of month 6 of your pregnancy.  Ask for help if you have counseling or nutritional needs during pregnancy. Your health care provider can offer advice or refer you to specialists for help with various needs.  Do not use hot tubs, steam rooms, or saunas.  Do not douche or use tampons or scented sanitary pads.  Do not cross your legs for long periods of time.  Avoid cat litter boxes and soil used by cats. These carry germs that can cause birth defects in the baby and possibly loss of the fetus by miscarriage or stillbirth.  Avoid all smoking, herbs, alcohol, and medicines not prescribed by your health care provider. Chemicals in these affect the formation and growth of the baby.  Schedule a dentist appointment. At home, brush your teeth with a soft toothbrush and be gentle when you floss. SEEK MEDICAL CARE IF:   You have dizziness.  You have mild pelvic cramps, pelvic pressure, or nagging pain in the abdominal area.  You have persistent nausea, vomiting, or diarrhea.  You have a bad smelling vaginal discharge.  You have pain with urination.  You notice increased swelling in your face, hands, legs, or ankles. SEEK IMMEDIATE MEDICAL CARE IF:   You have a fever.  You are leaking fluid from your vagina.  You have spotting or bleeding from your vagina.  You have severe abdominal cramping or pain.  You have rapid weight gain or loss.  You vomit blood or material that looks like coffee grounds.  You are exposed to [place] measles and have never had them.  You are exposed to fifth disease or chickenpox.  You develop a severe headache.  You have shortness of breath.  You have any kind of trauma, such as from a fall or a car accident. Document  Released: 11/01/2001 Document Revised: 03/24/2014 Document Reviewed: 09/17/2013 [facility] Patient Information 2015 [place], [place]. This information is not intended to replace advice given to you by your health care provider. Make sure you discuss any questions you have with your health care provider.

## 2014-08-07 ENCOUNTER — Other Ambulatory Visit: Payer: Medicaid Other

## 2014-08-07 DIAGNOSIS — O09291 Supervision of pregnancy with other poor reproductive or obstetric history, first trimester: Secondary | ICD-10-CM

## 2014-08-07 DIAGNOSIS — Z8632 Personal history of gestational diabetes: Principal | ICD-10-CM

## 2014-08-07 LAB — DRUG SCREEN, URINE, NO CONFIRMATION
Amphetamine Screen, Ur: NEGATIVE
BENZODIAZEPINES.: NEGATIVE
Barbiturate Quant, Ur: NEGATIVE
Cocaine Metabolites: NEGATIVE
Creatinine,U: 104 mg/dL
MARIJUANA METABOLITE: NEGATIVE
Methadone: NEGATIVE
Opiate Screen, Urine: NEGATIVE
PHENCYCLIDINE (PCP): NEGATIVE
PROPOXYPHENE: NEGATIVE

## 2014-08-07 LAB — RUBELLA SCREEN: Rubella: 0.66 Index (ref <0.90)

## 2014-08-07 LAB — RPR

## 2014-08-07 LAB — HIV ANTIBODY (ROUTINE TESTING W REFLEX): HIV 1&2 Ab, 4th Generation: NONREACTIVE

## 2014-08-07 LAB — URINE CULTURE: Colony Count: 15000

## 2014-08-07 LAB — URINALYSIS, ROUTINE W REFLEX MICROSCOPIC
BILIRUBIN URINE: NEGATIVE
Glucose, UA: NEGATIVE mg/dL
Hgb urine dipstick: NEGATIVE
Ketones, ur: NEGATIVE mg/dL
LEUKOCYTES UA: NEGATIVE
NITRITE: NEGATIVE
Protein, ur: NEGATIVE mg/dL
SPECIFIC GRAVITY, URINE: 1.019 (ref 1.005–1.030)
UROBILINOGEN UA: 0.2 mg/dL (ref 0.0–1.0)
pH: 7 (ref 5.0–8.0)

## 2014-08-07 LAB — HEPATITIS B SURFACE ANTIGEN: Hepatitis B Surface Ag: NEGATIVE

## 2014-08-07 LAB — VARICELLA ZOSTER ANTIBODY, IGG: Varicella IgG: 185.5 Index — ABNORMAL HIGH (ref <135.00)

## 2014-08-07 LAB — CBC
HCT: 39 % (ref 36.0–46.0)
Hemoglobin: 12.8 g/dL (ref 12.0–15.0)
MCH: 28.4 pg (ref 26.0–34.0)
MCHC: 32.8 g/dL (ref 30.0–36.0)
MCV: 86.7 fL (ref 78.0–100.0)
PLATELETS: 340 10*3/uL (ref 150–400)
RBC: 4.5 MIL/uL (ref 3.87–5.11)
RDW: 14 % (ref 11.5–15.5)
WBC: 7.1 10*3/uL (ref 4.0–10.5)

## 2014-08-07 LAB — OXYCODONE SCREEN, UA, RFLX CONFIRM: Oxycodone Screen, Ur: NEGATIVE ng/mL

## 2014-08-07 LAB — CYSTIC FIBROSIS DIAGNOSTIC STUDY

## 2014-08-07 LAB — ANTIBODY SCREEN: Antibody Screen: NEGATIVE

## 2014-08-07 LAB — ABO AND RH: Rh Type: NEGATIVE

## 2014-08-07 LAB — TSH: TSH: 1.12 u[IU]/mL (ref 0.350–4.500)

## 2014-08-07 LAB — GC/CHLAMYDIA PROBE AMP
CT PROBE, AMP APTIMA: NEGATIVE
GC PROBE AMP APTIMA: NEGATIVE

## 2014-08-08 LAB — GLUCOSE TOLERANCE, 2 HOURS W/ 1HR
GLUCOSE, FASTING: 86 mg/dL (ref 70–99)
GLUCOSE: 156 mg/dL (ref 70–170)
Glucose, 2 hour: 131 mg/dL (ref 70–139)

## 2014-08-11 ENCOUNTER — Encounter: Payer: Self-pay | Admitting: Women's Health

## 2014-08-11 DIAGNOSIS — Z283 Underimmunization status: Secondary | ICD-10-CM | POA: Insufficient documentation

## 2014-08-11 DIAGNOSIS — Z2839 Other underimmunization status: Secondary | ICD-10-CM | POA: Insufficient documentation

## 2014-08-11 DIAGNOSIS — O9989 Other specified diseases and conditions complicating pregnancy, childbirth and the puerperium: Secondary | ICD-10-CM

## 2014-08-12 ENCOUNTER — Encounter: Payer: Self-pay | Admitting: Advanced Practice Midwife

## 2014-08-27 ENCOUNTER — Ambulatory Visit (INDEPENDENT_AMBULATORY_CARE_PROVIDER_SITE_OTHER): Payer: Medicaid Other | Admitting: Advanced Practice Midwife

## 2014-08-27 ENCOUNTER — Other Ambulatory Visit: Payer: Self-pay | Admitting: Women's Health

## 2014-08-27 ENCOUNTER — Ambulatory Visit (INDEPENDENT_AMBULATORY_CARE_PROVIDER_SITE_OTHER): Payer: Medicaid Other

## 2014-08-27 ENCOUNTER — Encounter: Payer: Self-pay | Admitting: Advanced Practice Midwife

## 2014-08-27 VITALS — BP 110/70 | Wt 155.0 lb

## 2014-08-27 DIAGNOSIS — O09291 Supervision of pregnancy with other poor reproductive or obstetric history, first trimester: Secondary | ICD-10-CM

## 2014-08-27 DIAGNOSIS — O09211 Supervision of pregnancy with history of pre-term labor, first trimester: Secondary | ICD-10-CM

## 2014-08-27 DIAGNOSIS — Z3682 Encounter for antenatal screening for nuchal translucency: Secondary | ICD-10-CM

## 2014-08-27 DIAGNOSIS — Z23 Encounter for immunization: Secondary | ICD-10-CM

## 2014-08-27 DIAGNOSIS — O09521 Supervision of elderly multigravida, first trimester: Secondary | ICD-10-CM

## 2014-08-27 DIAGNOSIS — O4401 Placenta previa specified as without hemorrhage, first trimester: Secondary | ICD-10-CM

## 2014-08-27 DIAGNOSIS — Z8632 Personal history of gestational diabetes: Secondary | ICD-10-CM

## 2014-08-27 DIAGNOSIS — Z36 Encounter for antenatal screening of mother: Secondary | ICD-10-CM

## 2014-08-27 DIAGNOSIS — Z1389 Encounter for screening for other disorder: Secondary | ICD-10-CM

## 2014-08-27 DIAGNOSIS — Z3481 Encounter for supervision of other normal pregnancy, first trimester: Secondary | ICD-10-CM

## 2014-08-27 LAB — POCT URINALYSIS DIPSTICK
Glucose, UA: NEGATIVE
KETONES UA: NEGATIVE
Leukocytes, UA: NEGATIVE
Nitrite, UA: NEGATIVE
PROTEIN UA: NEGATIVE
RBC UA: NEGATIVE

## 2014-08-27 NOTE — Progress Notes (Signed)
[id number] [redacted]w[redacted]d Estimated Date of Delivery: 03/07/15  Blood pressure 110/70, weight 155 lb (70.308 kg), last menstrual period 05/31/2014.   BP weight and urine results all reviewed and noted.  Please refer to the obstetrical flow sheet for the fundal height and fetal heart rate documentation: Had NT/IT today;  P;acenta noted to be covering os  Patient denies any bleeding and no rupture of membranes symptoms or regular contractions. Patient is without complaints. All questions were answered.  Plan:  Continued routine obstetrical care, pelvic rest for now; recheck 19 weeks  Follow up in 4 weeks for OB appointment, start [place].  Ordered today.

## 2014-08-27 NOTE — Progress Notes (Addendum)
U/S(12+4wks)-single active fetus, CRL c/w dates, NB present, NT-2.65mm, FHR-156 bpm, cx appears closed, bilateral adnexa appears WNL, anterior Gr 0 placenta noted covering internal cervical os

## 2014-09-02 LAB — MATERNAL SCREEN, INTEGRATED #1

## 2014-09-22 ENCOUNTER — Encounter: Payer: Self-pay | Admitting: Women's Health

## 2014-09-22 ENCOUNTER — Encounter: Payer: Self-pay | Admitting: Advanced Practice Midwife

## 2014-09-22 DIAGNOSIS — O4400 Placenta previa specified as without hemorrhage, unspecified trimester: Secondary | ICD-10-CM | POA: Insufficient documentation

## 2014-09-24 ENCOUNTER — Ambulatory Visit (INDEPENDENT_AMBULATORY_CARE_PROVIDER_SITE_OTHER): Payer: Medicaid Other | Admitting: Women's Health

## 2014-09-24 VITALS — BP 112/66 | Wt 156.0 lb

## 2014-09-24 DIAGNOSIS — Z331 Pregnant state, incidental: Secondary | ICD-10-CM

## 2014-09-24 DIAGNOSIS — Z3682 Encounter for antenatal screening for nuchal translucency: Secondary | ICD-10-CM

## 2014-09-24 DIAGNOSIS — Z363 Encounter for antenatal screening for malformations: Secondary | ICD-10-CM

## 2014-09-24 DIAGNOSIS — O09212 Supervision of pregnancy with history of pre-term labor, second trimester: Secondary | ICD-10-CM

## 2014-09-24 DIAGNOSIS — O09892 Supervision of other high risk pregnancies, second trimester: Secondary | ICD-10-CM

## 2014-09-24 DIAGNOSIS — Z1389 Encounter for screening for other disorder: Secondary | ICD-10-CM

## 2014-09-24 DIAGNOSIS — Z3492 Encounter for supervision of normal pregnancy, unspecified, second trimester: Secondary | ICD-10-CM

## 2014-09-24 LAB — POCT URINALYSIS DIPSTICK
GLUCOSE UA: NEGATIVE
KETONES UA: NEGATIVE
LEUKOCYTES UA: NEGATIVE
Nitrite, UA: NEGATIVE
Protein, UA: NEGATIVE
RBC UA: NEGATIVE

## 2014-09-24 MED ORDER — HYDROXYPROGESTERONE CAPROATE 250 MG/ML IM OIL
250.0000 mg | TOPICAL_OIL | Freq: Once | INTRAMUSCULAR | Status: AC
  Administered 2014-09-24: 250 mg via INTRAMUSCULAR

## 2014-09-24 NOTE — Patient Instructions (Signed)
Second Trimester of Pregnancy The second trimester is from week 13 through week 28, months 4 through 6. The second trimester is often a time when you feel your best. Your body has also adjusted to being pregnant, and you begin to feel better physically. Usually, morning sickness has lessened or quit completely, you may have more energy, and you may have an increase in appetite. The second trimester is also a time when the fetus is growing rapidly. At the end of the sixth month, the fetus is about 9 inches long and weighs about 1 pounds. You will likely begin to feel the baby move (quickening) between 18 and 20 weeks of the pregnancy. BODY CHANGES Your body goes through many changes during pregnancy. The changes vary from woman to woman.   Your weight will continue to increase. You will notice your lower abdomen bulging out.  You may begin to get stretch marks on your hips, abdomen, and breasts.  You may develop headaches that can be relieved by medicines approved by your health care provider.  You may urinate more often because the fetus is pressing on your bladder.  You may develop or continue to have heartburn as a result of your pregnancy.  You may develop constipation because certain hormones are causing the muscles that push waste through your intestines to slow down.  You may develop hemorrhoids or swollen, bulging veins (varicose veins).  You may have back pain because of the weight gain and pregnancy hormones relaxing your joints between the bones in your pelvis and as a result of a shift in weight and the muscles that support your balance.  Your breasts will continue to grow and be tender.  Your gums may bleed and may be sensitive to brushing and flossing.  Dark spots or blotches (chloasma, mask of pregnancy) may develop on your face. This will likely fade after the baby is born.  A dark line from your belly button to the pubic area (linea nigra) may appear. This will likely fade  after the baby is born.  You may have changes in your hair. These can include thickening of your hair, rapid growth, and changes in texture. Some women also have hair loss during or after pregnancy, or hair that feels dry or thin. Your hair will most likely return to normal after your baby is born. WHAT TO EXPECT AT YOUR PRENATAL VISITS During a routine prenatal visit:  You will be weighed to make sure you and the fetus are growing normally.  Your blood pressure will be taken.  Your abdomen will be measured to track your baby's growth.  The fetal heartbeat will be listened to.  Any test results from the previous visit will be discussed. Your health care provider may ask you:  How you are feeling.  If you are feeling the baby move.  If you have had any abnormal symptoms, such as leaking fluid, bleeding, severe headaches, or abdominal cramping.  If you have any questions. Other tests that may be performed during your second trimester include:  Blood tests that check for:  Low iron levels (anemia).  Gestational diabetes (between 24 and 28 weeks).  Rh antibodies.  Urine tests to check for infections, diabetes, or protein in the urine.  An ultrasound to confirm the proper growth and development of the baby.  An amniocentesis to check for possible genetic problems.  Fetal screens for spina bifida and Down syndrome. HOME CARE INSTRUCTIONS   Avoid all smoking, herbs, alcohol, and unprescribed   drugs. These chemicals affect the formation and growth of the baby.  Follow your health care provider's instructions regarding medicine use. There are medicines that are either safe or unsafe to take during pregnancy.  Exercise only as directed by your health care provider. Experiencing uterine cramps is a good sign to stop exercising.  Continue to eat regular, healthy meals.  Wear a good support bra for breast tenderness.  Do not use hot tubs, steam rooms, or saunas.  Wear your  seat belt at all times when driving.  Avoid raw meat, uncooked cheese, cat litter boxes, and soil used by cats. These carry germs that can cause birth defects in the baby.  Take your prenatal vitamins.  Try taking a stool softener (if your health care provider approves) if you develop constipation. Eat more high-fiber foods, such as fresh vegetables or fruit and whole grains. Drink plenty of fluids to keep your urine clear or pale yellow.  Take warm sitz baths to soothe any pain or discomfort caused by hemorrhoids. Use hemorrhoid cream if your health care provider approves.  If you develop varicose veins, wear support hose. Elevate your feet for 15 minutes, 3-4 times a day. Limit salt in your diet.  Avoid heavy lifting, wear low heel shoes, and practice good posture.  Rest with your legs elevated if you have leg cramps or low back pain.  Visit your dentist if you have not gone yet during your pregnancy. Use a soft toothbrush to brush your teeth and be gentle when you floss.  A sexual relationship may be continued unless your health care provider directs you otherwise.  Continue to go to all your prenatal visits as directed by your health care provider. SEEK MEDICAL CARE IF:   You have dizziness.  You have mild pelvic cramps, pelvic pressure, or nagging pain in the abdominal area.  You have persistent nausea, vomiting, or diarrhea.  You have a bad smelling vaginal discharge.  You have pain with urination. SEEK IMMEDIATE MEDICAL CARE IF:   You have a fever.  You are leaking fluid from your vagina.  You have spotting or bleeding from your vagina.  You have severe abdominal cramping or pain.  You have rapid weight gain or loss.  You have shortness of breath with chest pain.  You notice sudden or extreme swelling of your face, hands, ankles, feet, or legs.  You have not felt your baby move in over an hour.  You have severe headaches that do not go away with  medicine.  You have vision changes. Document Released: 11/01/2001 Document Revised: 11/12/2013 Document Reviewed: 01/08/2013 [employer] Patient Information 2015 [employer]. This information is not intended to replace advice given to you by your health care provider. Make sure you discuss any questions you have with your health care provider.  

## 2014-09-24 NOTE — Progress Notes (Signed)
Low-risk OB appointment [id number] [redacted]w[redacted]d Estimated Date of Delivery: 03/07/15 BP 112/66 mmHg  Wt 156 lb (70.761 kg)  LMP 05/31/2014  BP, weight, and urine reviewed.  Refer to obstetrical flow sheet for FH & FHR.  No fm yet. Denies cramping, lof, vb, or uti s/s. No complaints. Continue pelvic rest for previa.  Reviewed warning s/s to report. Plan:  Continue routine obstetrical care, begin weekly Makena today F/U weekly for Makena, then 4wks for OB appointment and anatomy u/s and recheck previa

## 2014-09-27 LAB — MATERNAL SCREEN, INTEGRATED #2
AFP MoM: 1.15
AFP, SERUM MAT SCREEN: 36.4 ng/mL
CALCULATED GESTATIONAL AGE MAT SCREEN: 16.4
Crown Rump Length: 62.3 mm
ESTRIOL FREE MAT SCREEN: 0.83 ng/mL
Estriol Mom: 0.86
HCG, MOM MAT SCREEN: 1.02
INHIBIN A DIMERIC MAT SCREEN: 186 pg/mL
INHIBIN A MOM MAT SCREEN: 1.18
MSS Trisomy 18 Risk: <1:5000 {titer}
NT MoM: 1.5
NUMBER OF FETUSES MAT SCREEN 2: 1
Nuchal Translucency: 2.13 mm
PAPP-A MAT SCREEN: 690 ng/mL
PAPP-A MoM: 1.11
hCG, Serum: 32.2 IU/mL

## 2014-10-01 ENCOUNTER — Encounter: Payer: Self-pay | Admitting: Adult Health

## 2014-10-01 ENCOUNTER — Ambulatory Visit (INDEPENDENT_AMBULATORY_CARE_PROVIDER_SITE_OTHER): Payer: Medicaid Other | Admitting: Adult Health

## 2014-10-01 VITALS — BP 104/70 | Ht 60.0 in | Wt 156.0 lb

## 2014-10-01 DIAGNOSIS — Z8632 Personal history of gestational diabetes: Secondary | ICD-10-CM

## 2014-10-01 DIAGNOSIS — Z1389 Encounter for screening for other disorder: Secondary | ICD-10-CM

## 2014-10-01 DIAGNOSIS — Z331 Pregnant state, incidental: Secondary | ICD-10-CM

## 2014-10-01 DIAGNOSIS — Z3481 Encounter for supervision of other normal pregnancy, first trimester: Secondary | ICD-10-CM

## 2014-10-01 DIAGNOSIS — O09212 Supervision of pregnancy with history of pre-term labor, second trimester: Secondary | ICD-10-CM

## 2014-10-01 DIAGNOSIS — O09892 Supervision of other high risk pregnancies, second trimester: Secondary | ICD-10-CM

## 2014-10-01 DIAGNOSIS — O09291 Supervision of pregnancy with other poor reproductive or obstetric history, first trimester: Secondary | ICD-10-CM

## 2014-10-01 LAB — POCT URINALYSIS DIPSTICK
Glucose, UA: NEGATIVE
KETONES UA: NEGATIVE
Leukocytes, UA: NEGATIVE
Nitrite, UA: NEGATIVE
PROTEIN UA: NEGATIVE
RBC UA: NEGATIVE

## 2014-10-01 MED ORDER — HYDROXYPROGESTERONE CAPROATE 250 MG/ML IM OIL
250.0000 mg | TOPICAL_OIL | Freq: Once | INTRAMUSCULAR | Status: AC
Start: 2014-10-01 — End: 2014-10-01
  Administered 2014-10-01: 250 mg via INTRAMUSCULAR

## 2014-10-06 ENCOUNTER — Ambulatory Visit (INDEPENDENT_AMBULATORY_CARE_PROVIDER_SITE_OTHER): Payer: Medicaid Other | Admitting: Obstetrics and Gynecology

## 2014-10-06 ENCOUNTER — Encounter: Payer: Self-pay | Admitting: Obstetrics and Gynecology

## 2014-10-06 DIAGNOSIS — Z1389 Encounter for screening for other disorder: Secondary | ICD-10-CM

## 2014-10-06 DIAGNOSIS — Z331 Pregnant state, incidental: Secondary | ICD-10-CM

## 2014-10-06 DIAGNOSIS — O09212 Supervision of pregnancy with history of pre-term labor, second trimester: Secondary | ICD-10-CM

## 2014-10-06 LAB — POCT URINALYSIS DIPSTICK
GLUCOSE UA: NEGATIVE
KETONES UA: NEGATIVE
Leukocytes, UA: NEGATIVE
NITRITE UA: NEGATIVE
Protein, UA: NEGATIVE
RBC UA: NEGATIVE

## 2014-10-06 NOTE — Progress Notes (Signed)
Pt states that she was having some pain and pressure and cramping with some contractions. Pt states that it was hard for her to walk.

## 2014-10-06 NOTE — Progress Notes (Signed)
Patient ID: Monica Hancock, female   DOB: Mar 26, 1981, 32 y.o.   MRN: [id number] CC: pelvic pressure, concern due to hx PTD at [facility] [redacted]w[redacted]d Estimated Date of Delivery: 03/07/15  Blood pressure 100/60, weight 157 lb (71.215 kg), last menstrual period 05/31/2014.   refer to the ob flow sheet for FH and FHR, also BP, Wt, Urine results:negative  Patient reports +good fetal movement, denies any bleeding and no rupture of membranes symptoms or regular contractions.   Patient complaints: She is complaining of worsening contractions in her lower back radiating to her lower abdomen that make it hard to walk.  She states that she has been receiving injections that will prevent her from having an early delivery but feels like the baby is trying to come out.  Her first child was delivered at 36 wks.  She plans to receive tubal ligation after her delivery.    FH - 17cm FHR - 140 CERVIX: 4.4cm in length without any funneling  On TV u/s by J[facility] There is NO placenta previa. U/s shows anterior placenta with over 2 cm to internal os from placental edge. abdominal u/s shows no evidence of ovarian cyst   Assessment: [redacted]w[redacted]d, [id number], round ligament pains with no evidence of preterm cervical changes Plan:  Continued routine obstetrical care, reassure patient  F/u in 2 weeks if pt shows no cervical shortening , she intends to quit the 17-P  This chart was scribed for [name], MD by Monica Hancock, ED Scribe. This patient was seen in Room 1 and the patient's care was started at 10:23 AM.

## 2014-10-08 ENCOUNTER — Ambulatory Visit (INDEPENDENT_AMBULATORY_CARE_PROVIDER_SITE_OTHER): Payer: Medicaid Other | Admitting: Women's Health

## 2014-10-08 VITALS — BP 110/60 | Wt 161.0 lb

## 2014-10-08 DIAGNOSIS — Z331 Pregnant state, incidental: Secondary | ICD-10-CM

## 2014-10-08 DIAGNOSIS — Z8751 Personal history of pre-term labor: Secondary | ICD-10-CM

## 2014-10-08 DIAGNOSIS — Z1389 Encounter for screening for other disorder: Secondary | ICD-10-CM

## 2014-10-08 DIAGNOSIS — O09212 Supervision of pregnancy with history of pre-term labor, second trimester: Secondary | ICD-10-CM

## 2014-10-08 LAB — POCT URINALYSIS DIPSTICK
GLUCOSE UA: NEGATIVE
KETONES UA: NEGATIVE
Leukocytes, UA: NEGATIVE
Nitrite, UA: NEGATIVE
Protein, UA: NEGATIVE
RBC UA: NEGATIVE

## 2014-10-08 MED ORDER — HYDROXYPROGESTERONE CAPROATE 250 MG/ML IM OIL
250.0000 mg | TOPICAL_OIL | Freq: Once | INTRAMUSCULAR | Status: AC
  Administered 2014-10-08: 250 mg via INTRAMUSCULAR

## 2014-10-08 NOTE — Progress Notes (Signed)
Pt here for 17P, originally had appt scheduled today but was just seen 2d ago and is not having any problems today, already has appt 12/3 for anatomy u/s and visit. Pt not seen by provider today, just 17P injection.

## 2014-10-15 ENCOUNTER — Ambulatory Visit (INDEPENDENT_AMBULATORY_CARE_PROVIDER_SITE_OTHER): Payer: Medicaid Other | Admitting: Adult Health

## 2014-10-15 ENCOUNTER — Encounter: Payer: Self-pay | Admitting: Adult Health

## 2014-10-15 VITALS — BP 106/62 | Ht 60.0 in | Wt 159.0 lb

## 2014-10-15 DIAGNOSIS — Z331 Pregnant state, incidental: Secondary | ICD-10-CM

## 2014-10-15 DIAGNOSIS — O09212 Supervision of pregnancy with history of pre-term labor, second trimester: Secondary | ICD-10-CM

## 2014-10-15 DIAGNOSIS — Z1389 Encounter for screening for other disorder: Secondary | ICD-10-CM

## 2014-10-15 DIAGNOSIS — O09892 Supervision of other high risk pregnancies, second trimester: Secondary | ICD-10-CM

## 2014-10-15 LAB — POCT URINALYSIS DIPSTICK
Blood, UA: NEGATIVE
GLUCOSE UA: NEGATIVE
LEUKOCYTES UA: NEGATIVE
Nitrite, UA: NEGATIVE
PROTEIN UA: NEGATIVE

## 2014-10-15 MED ORDER — HYDROXYPROGESTERONE CAPROATE 250 MG/ML IM OIL
250.0000 mg | TOPICAL_OIL | Freq: Once | INTRAMUSCULAR | Status: AC
  Administered 2014-10-15: 250 mg via INTRAMUSCULAR

## 2014-10-23 ENCOUNTER — Ambulatory Visit (INDEPENDENT_AMBULATORY_CARE_PROVIDER_SITE_OTHER): Payer: Medicaid Other

## 2014-10-23 ENCOUNTER — Ambulatory Visit (INDEPENDENT_AMBULATORY_CARE_PROVIDER_SITE_OTHER): Payer: Medicaid Other | Admitting: Advanced Practice Midwife

## 2014-10-23 ENCOUNTER — Other Ambulatory Visit: Payer: Self-pay | Admitting: Women's Health

## 2014-10-23 VITALS — BP 110/50 | Wt 158.0 lb

## 2014-10-23 DIAGNOSIS — O09212 Supervision of pregnancy with history of pre-term labor, second trimester: Secondary | ICD-10-CM

## 2014-10-23 DIAGNOSIS — Z3689 Encounter for other specified antenatal screening: Secondary | ICD-10-CM

## 2014-10-23 DIAGNOSIS — Z331 Pregnant state, incidental: Secondary | ICD-10-CM

## 2014-10-23 DIAGNOSIS — O09892 Supervision of other high risk pregnancies, second trimester: Secondary | ICD-10-CM

## 2014-10-23 DIAGNOSIS — Z1389 Encounter for screening for other disorder: Secondary | ICD-10-CM

## 2014-10-23 DIAGNOSIS — O09522 Supervision of elderly multigravida, second trimester: Secondary | ICD-10-CM

## 2014-10-23 DIAGNOSIS — Z363 Encounter for antenatal screening for malformations: Secondary | ICD-10-CM

## 2014-10-23 DIAGNOSIS — Z36 Encounter for antenatal screening of mother: Secondary | ICD-10-CM

## 2014-10-23 DIAGNOSIS — O4412 Placenta previa with hemorrhage, second trimester: Secondary | ICD-10-CM

## 2014-10-23 DIAGNOSIS — O4402 Placenta previa specified as without hemorrhage, second trimester: Secondary | ICD-10-CM

## 2014-10-23 LAB — POCT URINALYSIS DIPSTICK
Blood, UA: NEGATIVE
Glucose, UA: NEGATIVE
LEUKOCYTES UA: NEGATIVE
NITRITE UA: NEGATIVE
PROTEIN UA: NEGATIVE

## 2014-10-23 MED ORDER — METRONIDAZOLE 500 MG PO TABS: 500.0000 mg | ORAL_TABLET | Freq: Two times a day (BID) | ORAL | Status: DC

## 2014-10-23 NOTE — Progress Notes (Signed)
U/S(20+5wks)-active fetus, meas c/w dates, fluid wnl, anterior Gr 0 placenta marginal previa remains,cx appears closed 3.5 cm, bilateral adnexa appears WNL, FHR- 141 bpm, female fetus, no major abnl noted, would like to reck placental previa at ~28 weeks

## 2014-10-23 NOTE — Progress Notes (Signed)
High Risk Pregnancy Diagnosis(es):   Marginal placenta previa  [id number] [redacted]w[redacted]d Estimated Date of Delivery: 03/07/15  Blood pressure 110/50, weight 158 lb (71.668 kg), last menstrual period 05/31/2014.  Urinalysis: Negative   HPI: Had anatomy scan today (sll normal), placenta previa remains.  Feels "awful" after 17p (muscle spasms and pain), so wants to stop injections.  C/O vaginal dc with odor.     BP weight and urine results all reviewed and noted.  [place] today:  U/S(20+5wks)-active fetus, meas c/w dates, fluid wnl, anterior Gr 0 placenta marginal previa remains,cx appears closed 3.5 cm, bilateral adnexa appears WNL, FHR- 141 bpm, female fetus, no major abnl noted, would like to reck placental previa at ~28 weeks  Patient reports good fetal movement, denies any bleeding and no rupture of membranes symptoms or regular contractions. SSE:  Thin frothy dc with amine odor.    Patient is without complaints. All questions were answered.  Assessment:  [redacted]w[redacted]d,   Marginal placenta previa  Medication(s) Plans:  Flagyl 500mg   Treatment Plan: Continue pelvic rest.  Flagyl 500mg  BID X7 Repeat US 28 weeks  Follow up in 4 weeks for OB appt,

## 2014-11-07 ENCOUNTER — Telehealth: Payer: Self-pay | Admitting: Obstetrics & Gynecology

## 2014-11-07 NOTE — Telephone Encounter (Signed)
Pt c/o scratchy throat, cough, runny nose. Pt informed can take OTC Robitussin (plain), Tylenol for HA, gargle with warm salt water, Lozenges. Pt informed if no improvement to call office back. Pt verbalized understanding.

## 2014-11-08 ENCOUNTER — Encounter (HOSPITAL_COMMUNITY): Payer: Self-pay | Admitting: *Deleted

## 2014-11-08 ENCOUNTER — Emergency Department (HOSPITAL_COMMUNITY)
Admission: EM | Admit: 2014-11-08 | Discharge: 2014-11-08 | Disposition: A | Discharge status: Home / Self Care | Payer: Medicaid Other | Attending: Emergency Medicine | Admitting: Emergency Medicine

## 2014-11-08 DIAGNOSIS — J069 Acute upper respiratory infection, unspecified: Secondary | ICD-10-CM | POA: Diagnosis not present

## 2014-11-08 DIAGNOSIS — R51 Headache: Secondary | ICD-10-CM | POA: Insufficient documentation

## 2014-11-08 DIAGNOSIS — Z792 Long term (current) use of antibiotics: Secondary | ICD-10-CM | POA: Insufficient documentation

## 2014-11-08 DIAGNOSIS — Z79899 Other long term (current) drug therapy: Secondary | ICD-10-CM | POA: Insufficient documentation

## 2014-11-08 DIAGNOSIS — R519 Headache, unspecified: Secondary | ICD-10-CM

## 2014-11-08 DIAGNOSIS — Z8632 Personal history of gestational diabetes: Secondary | ICD-10-CM | POA: Insufficient documentation

## 2014-11-08 MED ORDER — ACETAMINOPHEN 500 MG PO TABS
1000.0000 mg | ORAL_TABLET | Freq: Once | ORAL | Status: AC
  Administered 2014-11-08: 1000 mg via ORAL
  Filled 2014-11-08: qty 2

## 2014-11-08 NOTE — Discharge Instructions (Signed)
You may use Tylenol 1000 mg every 6 hours as needed for fever and pain. Unfortunately this is the only medication that is safe for you to take for your headache during pregnancy. You may also use over-the-counter nasal saline and Afrin nasal spray for nasal congestion. Because I think that your symptoms are viral, I do not feel antibiotic help as these only work with bacterial infections. Your symptoms continue or worsen, I recommend he follow-up with your primary care physician in the next 3-4 days.   Sinus Headache A sinus headache is when your sinuses become clogged or swollen. Sinus headaches can range from mild to severe.  CAUSES A sinus headache can have different causes, such as:  Colds.  Sinus infections.  Allergies. SYMPTOMS  Symptoms of a sinus headache may vary and can include:  Headache.  Pain or pressure in the face.  Congested or runny nose.  Fever.  Inability to smell.  Pain in upper teeth. Weather changes can make symptoms worse. TREATMENT  The treatment of a sinus headache depends on the cause.  Sinus pain caused by a sinus infection may be treated with antibiotic medicine.  Sinus pain caused by allergies may be helped by allergy medicines (antihistamines) and medicated nasal sprays.  Sinus pain caused by congestion may be helped by flushing the nose and sinuses with saline solution. HOME CARE INSTRUCTIONS   If antibiotics are prescribed, take them as directed. Finish them even if you start to feel better.  Only take over-the-counter or prescription medicines for pain, discomfort, or fever as directed by your caregiver.  If you have congestion, use a nasal spray to help reduce pressure. SEEK IMMEDIATE MEDICAL CARE IF:  You have a fever.  You have headaches more than once a week.  You have sensitivity to light or sound.  You have repeated nausea and vomiting.  You have vision problems.  You have sudden, severe pain in your face or head.  You  have a seizure.  You are confused.  Your sinus headaches do not get better after treatment. Many people think they have a sinus headache when they actually have migraines or tension headaches. MAKE SURE YOU:   Understand these instructions.  Will watch your condition.  Will get help right away if you are not doing well or get worse. Document Released: 12/15/2004 Document Revised: 01/30/2012 Document Reviewed: 02/05/2011 [facility] Patient Information 2015 [place], [place]. This information is not intended to replace advice given to you by your health care provider. Make sure you discuss any questions you have with your health care provider.  Upper Respiratory Infection, Adult An upper respiratory infection (URI) is also sometimes known as the common cold. The upper respiratory tract includes the nose, sinuses, throat, trachea, and bronchi. Bronchi are the airways leading to the lungs. Most people improve within 1 week, but symptoms can last up to 2 weeks. A residual cough may last even longer.  CAUSES Many different viruses can infect the tissues lining the upper respiratory tract. The tissues become irritated and inflamed and often become very moist. Mucus production is also common. A cold is contagious. You can easily spread the virus to others by oral contact. This includes kissing, sharing a glass, coughing, or sneezing. Touching your mouth or nose and then touching a surface, which is then touched by another person, can also spread the virus. SYMPTOMS  Symptoms typically develop 1 to 3 days after you come in contact with a cold virus. Symptoms vary from person to  person. They may include:  Runny nose.  Sneezing.  Nasal congestion.  Sinus irritation.  Sore throat.  Loss of voice (laryngitis).  Cough.  Fatigue.  Muscle aches.  Loss of appetite.  Headache.  Low-grade fever. DIAGNOSIS  You might diagnose your own cold based on familiar symptoms, since most people get a  cold 2 to 3 times a year. Your caregiver can confirm this based on your exam. Most importantly, your caregiver can check that your symptoms are not due to another disease such as strep throat, sinusitis, pneumonia, asthma, or epiglottitis. Blood tests, throat tests, and X-rays are not necessary to diagnose a common cold, but they may sometimes be helpful in excluding other more serious diseases. Your caregiver will decide if any further tests are required. RISKS AND COMPLICATIONS  You may be at risk for a more severe case of the common cold if you smoke cigarettes, have chronic heart disease (such as heart failure) or lung disease (such as asthma), or if you have a weakened immune system. The very young and very old are also at risk for more serious infections. Bacterial sinusitis, middle ear infections, and bacterial pneumonia can complicate the common cold. The common cold can worsen asthma and chronic obstructive pulmonary disease (COPD). Sometimes, these complications can require emergency medical care and may be life-threatening. PREVENTION  The best way to protect against getting a cold is to practice good hygiene. Avoid oral or hand contact with people with cold symptoms. Wash your hands often if contact occurs. There is no clear evidence that vitamin C, vitamin E, echinacea, or exercise reduces the chance of developing a cold. However, it is always recommended to get plenty of rest and practice good nutrition. TREATMENT  Treatment is directed at relieving symptoms. There is no cure. Antibiotics are not effective, because the infection is caused by a virus, not by bacteria. Treatment may include:  Increased fluid intake. Sports drinks offer valuable electrolytes, sugars, and fluids.  Breathing heated mist or steam (vaporizer or shower).  Eating chicken soup or other clear broths, and maintaining good nutrition.  Getting plenty of rest.  Using gargles or lozenges for comfort.  Controlling  fevers with ibuprofen or acetaminophen as directed by your caregiver.  Increasing usage of your inhaler if you have asthma. Zinc gel and zinc lozenges, taken in the first 24 hours of the common cold, can shorten the duration and lessen the severity of symptoms. Pain medicines may help with fever, muscle aches, and throat pain. A variety of non-prescription medicines are available to treat congestion and runny nose. Your caregiver can make recommendations and may suggest nasal or lung inhalers for other symptoms.  HOME CARE INSTRUCTIONS   Only take over-the-counter or prescription medicines for pain, discomfort, or fever as directed by your caregiver.  Use a warm mist humidifier or inhale steam from a shower to increase air moisture. This may keep secretions moist and make it easier to breathe.  Drink enough water and fluids to keep your urine clear or pale yellow.  Rest as needed.  Return to work when your temperature has returned to normal or as your caregiver advises. You may need to stay home longer to avoid infecting others. You can also use a face mask and careful hand washing to prevent spread of the virus. SEEK MEDICAL CARE IF:   After the first few days, you feel you are getting worse rather than better.  You need your caregiver's advice about medicines to control symptoms.  You develop chills, worsening shortness of breath, or brown or red sputum. These may be signs of pneumonia.  You develop yellow or brown nasal discharge or pain in the face, especially when you bend forward. These may be signs of sinusitis.  You develop a fever, swollen neck glands, pain with swallowing, or white areas in the back of your throat. These may be signs of strep throat. SEEK IMMEDIATE MEDICAL CARE IF:   You have a fever.  You develop severe or persistent headache, ear pain, sinus pain, or chest pain.  You develop wheezing, a prolonged cough, cough up blood, or have a change in your usual mucus  (if you have chronic lung disease).  You develop sore muscles or a stiff neck. Document Released: 05/03/2001 Document Revised: 01/30/2012 Document Reviewed: 02/12/2014 [facility] Patient Information 2015 [place], [place]. This information is not intended to replace advice given to you by your health care provider. Make sure you discuss any questions you have with your health care provider.

## 2014-11-08 NOTE — ED Provider Notes (Signed)
TIME SEEN: 6:04 PM  CHIEF COMPLAINT: Headache  HPI: Patient is a 33 y.o. Female who is [redacted] weeks pregnant, with a history of gestational diabetes, who presents to the ED today complaining of headache, rhinorrhea, sinus congestion, and productive cough with green sputum that began 3 days ago. She reports associated photophobia and left sided otalgia. She denies associated migraines or nausea. She denies associated fevers, chills, nausea, vomiting, diarrhea. Patient has received a flu shot this year.  Denies vaginal bleeding or discharge. She states she has not been leaking any fluid. She states she feels her baby moving. She is here with her son who has similar symptoms.  ROS: See HPI Constitutional: no fever  Eyes: no drainage; Photophobia ENT: no runny nose; Right sided otalgia Cardiovascular:  no chest pain  Resp: no SOB; Productive cough with green sputum GI: no vomiting GU: no dysuria Integumentary: no rash  Allergy: no hives  Musculoskeletal: no leg swelling  Neurological: no slurred speech; Headache ROS otherwise negative  PAST MEDICAL HISTORY/PAST SURGICAL HISTORY:  Past Medical History  Diagnosis Date  . Gestational diabetes     MEDICATIONS:  Prior to Admission medications   Medication Sig Start Date End Date Taking? Authorizing Provider  Doxylamine-Pyridoxine (DICLEGIS) 10-10 MG TBEC 2 tabs q hs, if sx persist add 1 tab q am on day 3, if sx persist add 1 tab q afternoon on day 4 08/06/14   Tawnya Crook, CNM  metroNIDAZOLE (FLAGYL) 500 MG tablet Take 1 tablet (500 mg total) by mouth 2 (two) times daily. 10/23/14   Christin Fudge, CNM  Prenatal Vit-Fe Fumarate-FA (MULTIVITAMIN-PRENATAL) 27-0.8 MG TABS tablet Take 1 tablet by mouth daily at 12 noon. 08/06/14   Tawnya Crook, CNM    ALLERGIES:  No Known Allergies  SOCIAL HISTORY:  History  Substance Use Topics  . Smoking status: Never Smoker   . Smokeless tobacco: Never Used  . Alcohol Use: No     FAMILY HISTORY: Family History  Problem Relation Age of Onset  . Diabetes Mother   . Cancer Mother     breast  . Diabetes Maternal Grandmother   . Cancer Maternal Grandfather     EXAM:  Triage Vitals: BP 114/66 mmHg  Pulse 82  Temp(Src) 98.4 F (36.9 C) (Oral)  Resp 16  SpO2 100%  LMP 05/31/2014  CONSTITUTIONAL: Alert and oriented and responds appropriately to questions. Well-appearing; well-nourished HEAD: Normocephalic EYES: Conjunctivae clear, PERRL ENT: normal nose; no rhinorrhea; moist mucous membranes; pharynx without lesions noted; Mildly inflamed bilateral nasal turbinates; Clear nasal congestion; Mild erythema of the left tm without bulging or purulence; Clear nasal drainage in posterior oropharynx; No pharyngeal erythema; tender to palpation over bilateral maxillary sinuses NECK: Supple, no meningismus, no LAD; No tonsillar hypertrophy or exudate CARD: RRR; S1 and S2 appreciated; no murmurs, no clicks, no rubs, no gallops RESP: Normal chest excursion without splinting or tachypnea; breath sounds clear and equal bilaterally; no wheezes, no rhonchi, no rales,  ABD/GI: Normal bowel sounds; non-distended; soft, non-tender, no rebound, no guarding, gravid uterus BACK:  The back appears normal and is non-tender to palpation, there is no CVA tenderness EXT: Normal ROM in all joints; non-tender to palpation; no edema; normal capillary refill; no cyanosis    SKIN: Normal color for age and race; warm NEURO: Moves all extremities equally sensation to light touch intact diffusely, cranial nerves II through XII intact, normal gait PSYCH: The patient's mood and manner are appropriate. Grooming and personal hygiene are  appropriate.   MEDICAL DECISION MAKING: Patient here with sinus headache, viral upper respiratory infection. Have discussed with patient that the only thing safer to take for headache is Tylenol. Will give dose of the ED. She is neurologically intact and afebrile.  No meningismus. Lungs are clear. I do not feel she needs antibiotic at this time. I feel she is safe to be discharged home with instructions for over-the-counter medications to use for supportive treatment including nasal saline, Tylenol, plan of rest and drink fluids. Discussed return precautions. She verbalized understanding and is comfortable with plan.      [place], DO 11/08/14 [id number]

## 2014-11-08 NOTE — ED Notes (Addendum)
Headache, runny nose, and  Productive cough at times, green in color. x 3 days. States, "the light is bothering me right now" Denies hx of migraines. Left ear pain also. Denies nausea.

## 2014-11-20 ENCOUNTER — Ambulatory Visit (INDEPENDENT_AMBULATORY_CARE_PROVIDER_SITE_OTHER): Payer: Medicaid Other | Admitting: Obstetrics & Gynecology

## 2014-11-20 ENCOUNTER — Encounter: Payer: Self-pay | Admitting: Obstetrics & Gynecology

## 2014-11-20 VITALS — BP 110/70 | Wt 161.0 lb

## 2014-11-20 DIAGNOSIS — O4402 Placenta previa specified as without hemorrhage, second trimester: Secondary | ICD-10-CM

## 2014-11-20 DIAGNOSIS — O09212 Supervision of pregnancy with history of pre-term labor, second trimester: Secondary | ICD-10-CM

## 2014-11-20 DIAGNOSIS — Z331 Pregnant state, incidental: Secondary | ICD-10-CM

## 2014-11-20 DIAGNOSIS — O09892 Supervision of other high risk pregnancies, second trimester: Secondary | ICD-10-CM

## 2014-11-20 DIAGNOSIS — Z1389 Encounter for screening for other disorder: Secondary | ICD-10-CM

## 2014-11-20 DIAGNOSIS — O4412 Placenta previa with hemorrhage, second trimester: Secondary | ICD-10-CM

## 2014-11-20 LAB — POCT URINALYSIS DIPSTICK
Blood, UA: NEGATIVE
Glucose, UA: NEGATIVE
Ketones, UA: NEGATIVE
Leukocytes, UA: NEGATIVE
Nitrite, UA: NEGATIVE
Protein, UA: NEGATIVE

## 2014-11-20 NOTE — Progress Notes (Signed)
High Risk Pregnancy Diagnosis(es):   Marginal previa  [id number] [redacted]w[redacted]d Estimated Date of Delivery: 03/07/15  Blood pressure 110/70, weight 161 lb (73.029 kg), last menstrual period 05/31/2014.  Urinalysis: Negative   HPI: No complaints     BP weight and urine results all reviewed and noted. Patient reports good fetal movement, denies any bleeding and no rupture of membranes symptoms or regular contractions.  Fundal Height:  34 Fetal Heart rate:  144 Edema:  none  Patient is without complaints. All questions were answered.  Assessment:  [redacted]w[redacted]d,   Marginal previa  Medication(s) Plans:  No changes  Treatment Plan:  PN2, sonogram next visit  Follow up in 3 weeks for OB appt, sono

## 2014-11-20 NOTE — Addendum Note (Signed)
Addended by: Doyne Keel on: 11/20/2014 12:00 PM   Modules accepted: Orders

## 2014-12-11 ENCOUNTER — Ambulatory Visit (INDEPENDENT_AMBULATORY_CARE_PROVIDER_SITE_OTHER): Payer: Medicaid Other

## 2014-12-11 ENCOUNTER — Encounter: Payer: Self-pay | Admitting: Obstetrics & Gynecology

## 2014-12-11 ENCOUNTER — Ambulatory Visit (INDEPENDENT_AMBULATORY_CARE_PROVIDER_SITE_OTHER): Payer: Medicaid Other | Admitting: Obstetrics & Gynecology

## 2014-12-11 ENCOUNTER — Other Ambulatory Visit: Payer: Medicaid Other

## 2014-12-11 VITALS — BP 108/60 | Wt 164.4 lb

## 2014-12-11 DIAGNOSIS — Z331 Pregnant state, incidental: Secondary | ICD-10-CM

## 2014-12-11 DIAGNOSIS — Z3482 Encounter for supervision of other normal pregnancy, second trimester: Secondary | ICD-10-CM

## 2014-12-11 DIAGNOSIS — O09292 Supervision of pregnancy with other poor reproductive or obstetric history, second trimester: Secondary | ICD-10-CM

## 2014-12-11 DIAGNOSIS — Z8632 Personal history of gestational diabetes: Secondary | ICD-10-CM

## 2014-12-11 DIAGNOSIS — Z1389 Encounter for screening for other disorder: Secondary | ICD-10-CM

## 2014-12-11 DIAGNOSIS — Z131 Encounter for screening for diabetes mellitus: Secondary | ICD-10-CM

## 2014-12-11 DIAGNOSIS — O4412 Placenta previa with hemorrhage, second trimester: Secondary | ICD-10-CM

## 2014-12-11 DIAGNOSIS — O09212 Supervision of pregnancy with history of pre-term labor, second trimester: Secondary | ICD-10-CM

## 2014-12-11 DIAGNOSIS — Z113 Encounter for screening for infections with a predominantly sexual mode of transmission: Secondary | ICD-10-CM

## 2014-12-11 DIAGNOSIS — O09892 Supervision of other high risk pregnancies, second trimester: Secondary | ICD-10-CM

## 2014-12-11 DIAGNOSIS — Z0184 Encounter for antibody response examination: Secondary | ICD-10-CM

## 2014-12-11 DIAGNOSIS — Z3492 Encounter for supervision of normal pregnancy, unspecified, second trimester: Secondary | ICD-10-CM

## 2014-12-11 DIAGNOSIS — Z114 Encounter for screening for human immunodeficiency virus [HIV]: Secondary | ICD-10-CM

## 2014-12-11 DIAGNOSIS — O4402 Placenta previa specified as without hemorrhage, second trimester: Secondary | ICD-10-CM

## 2014-12-11 LAB — POCT URINALYSIS DIPSTICK
Blood, UA: NEGATIVE
GLUCOSE UA: NEGATIVE
KETONES UA: NEGATIVE
Leukocytes, UA: NEGATIVE
NITRITE UA: NEGATIVE
PROTEIN UA: NEGATIVE

## 2014-12-11 LAB — CBC
HEMATOCRIT: 35.6 % — AB (ref 36.0–46.0)
Hemoglobin: 12.1 g/dL (ref 12.0–15.0)
MCH: 30.3 pg (ref 26.0–34.0)
MCHC: 34 g/dL (ref 30.0–36.0)
MCV: 89 fL (ref 78.0–100.0)
MPV: 9.3 fL (ref 8.6–12.4)
Platelets: 321 10*3/uL (ref 150–400)
RBC: 4 MIL/uL (ref 3.87–5.11)
RDW: 13.1 % (ref 11.5–15.5)
WBC: 8.9 10*3/uL (ref 4.0–10.5)

## 2014-12-11 NOTE — Progress Notes (Signed)
Marginal previa has resolved, 3.4 cm from cervical edge [id number] [redacted]w[redacted]d Estimated Date of Delivery: 03/07/15  Blood pressure 108/60, weight 164 lb 6.4 oz (74.571 kg), last menstrual period 05/31/2014.   BP weight and urine results all reviewed and noted.  Please refer to the obstetrical flow sheet for the fundal height and fetal heart rate documentation:  Patient reports good fetal movement, denies any bleeding and no rupture of membranes symptoms or regular contractions. Patient is without complaints. All questions were answered.  Plan:  Continued routine obstetrical care,   Follow up in 1 weeks for RhoGam, 3 weeks for appoint

## 2014-12-11 NOTE — Progress Notes (Signed)
Follow up US performed today.  Single, active IUP at 27+[redacted] weeks GA in a breech presentation with FHR of 142 bpm.  Cervix is closed and measures 4.1cm.  Anterior Gr 2 placenta, no longer low lying (3.14cm from internal os).  Polyhydramnios present with SVP of 10.3.  Right ovary is WNL.  Left ovary not visualized today.  EFW today of 1349 g (74%) with AC measurement at 94%.

## 2014-12-12 LAB — GLUCOSE TOLERANCE, 2 HOURS W/ 1HR
Glucose, 1 hour: 149 mg/dL (ref 70–170)
Glucose, 2 hour: 94 mg/dL (ref 70–139)
Glucose, Fasting: 80 mg/dL (ref 70–99)

## 2014-12-12 LAB — ANTIBODY SCREEN: Antibody Screen: NEGATIVE

## 2014-12-12 LAB — RPR

## 2014-12-12 LAB — HIV ANTIBODY (ROUTINE TESTING W REFLEX): HIV 1&2 Ab, 4th Generation: NONREACTIVE

## 2014-12-15 LAB — HSV 2 ANTIBODY, IGG: HSV 2 Glycoprotein G Ab, IgG: 3.53 IV — ABNORMAL HIGH

## 2014-12-18 ENCOUNTER — Ambulatory Visit (INDEPENDENT_AMBULATORY_CARE_PROVIDER_SITE_OTHER): Payer: Medicaid Other | Admitting: *Deleted

## 2014-12-18 ENCOUNTER — Encounter: Payer: Self-pay | Admitting: *Deleted

## 2014-12-18 VITALS — BP 110/72 | Ht 60.0 in | Wt 165.0 lb

## 2014-12-18 DIAGNOSIS — O360131 Maternal care for anti-D [Rh] antibodies, third trimester, fetus 1: Secondary | ICD-10-CM

## 2014-12-18 DIAGNOSIS — Z1389 Encounter for screening for other disorder: Secondary | ICD-10-CM

## 2014-12-18 DIAGNOSIS — Z331 Pregnant state, incidental: Secondary | ICD-10-CM

## 2014-12-18 LAB — POCT URINALYSIS DIPSTICK
Blood, UA: NEGATIVE
Glucose, UA: NEGATIVE
Ketones, UA: NEGATIVE
Leukocytes, UA: NEGATIVE
Nitrite, UA: NEGATIVE
Protein, UA: NEGATIVE

## 2014-12-18 MED ORDER — RHO D IMMUNE GLOBULIN 1500 UNIT/2ML IJ SOSY
300.0000 ug | PREFILLED_SYRINGE | Freq: Once | INTRAMUSCULAR | Status: AC
  Administered 2014-12-18: 300 ug via INTRAMUSCULAR

## 2014-12-18 NOTE — Progress Notes (Signed)
Pt here for Rhogam injection. Pt reports having some possible contractions on Monday, but none since then. Pt has a scheduled appt next month. Was advised to keep that appt and call sooner with any problems. Pt voiced understanding. [place]

## 2015-01-01 ENCOUNTER — Encounter: Payer: Self-pay | Admitting: Obstetrics & Gynecology

## 2015-01-01 ENCOUNTER — Ambulatory Visit (INDEPENDENT_AMBULATORY_CARE_PROVIDER_SITE_OTHER): Payer: Medicaid Other | Admitting: Obstetrics & Gynecology

## 2015-01-01 VITALS — BP 120/70 | Wt 166.0 lb

## 2015-01-01 DIAGNOSIS — Z3493 Encounter for supervision of normal pregnancy, unspecified, third trimester: Secondary | ICD-10-CM

## 2015-01-01 DIAGNOSIS — Z331 Pregnant state, incidental: Secondary | ICD-10-CM

## 2015-01-01 DIAGNOSIS — Z1389 Encounter for screening for other disorder: Secondary | ICD-10-CM

## 2015-01-01 LAB — POCT URINALYSIS DIPSTICK
Blood, UA: NEGATIVE
Glucose, UA: NEGATIVE
Ketones, UA: NEGATIVE
Leukocytes, UA: NEGATIVE
Nitrite, UA: NEGATIVE
PROTEIN UA: NEGATIVE

## 2015-01-01 NOTE — Addendum Note (Signed)
Addended by: Doyne Keel on: 01/01/2015 11:40 AM   Modules accepted: Orders

## 2015-01-01 NOTE — Progress Notes (Signed)
[id number] [redacted]w[redacted]d Estimated Date of Delivery: 03/07/15  Blood pressure 120/70, weight 166 lb (75.297 kg), last menstrual period 05/31/2014.   BP weight and urine results all reviewed and noted.  Please refer to the obstetrical flow sheet for the fundal height and fetal heart rate documentation:  Patient reports good fetal movement, denies any bleeding and no rupture of membranes symptoms or regular contractions. Patient is without complaints. All questions were answered.  Plan:  Continued routine obstetrical care,   Follow up in 2 weeks for OB appointment, routine

## 2015-01-15 ENCOUNTER — Encounter: Payer: Self-pay | Admitting: Advanced Practice Midwife

## 2015-01-15 ENCOUNTER — Ambulatory Visit (INDEPENDENT_AMBULATORY_CARE_PROVIDER_SITE_OTHER): Payer: Medicaid Other | Admitting: Advanced Practice Midwife

## 2015-01-15 VITALS — BP 100/70 | HR 80 | Wt 167.0 lb

## 2015-01-15 DIAGNOSIS — Z331 Pregnant state, incidental: Secondary | ICD-10-CM

## 2015-01-15 DIAGNOSIS — Z3483 Encounter for supervision of other normal pregnancy, third trimester: Secondary | ICD-10-CM

## 2015-01-15 DIAGNOSIS — Z1389 Encounter for screening for other disorder: Secondary | ICD-10-CM

## 2015-01-15 LAB — POCT URINALYSIS DIPSTICK
Blood, UA: NEGATIVE
GLUCOSE UA: NEGATIVE
KETONES UA: NEGATIVE
LEUKOCYTES UA: NEGATIVE
Nitrite, UA: NEGATIVE
PROTEIN UA: NEGATIVE

## 2015-01-15 MED ORDER — ACYCLOVIR 400 MG PO TABS: 400.0000 mg | ORAL_TABLET | Freq: Three times a day (TID) | ORAL | Status: DC

## 2015-01-15 NOTE — Progress Notes (Signed)
Pt denies any problems or concerns at this time.  

## 2015-01-15 NOTE — Progress Notes (Signed)
[id number] [redacted]w[redacted]d Estimated Date of Delivery: 03/07/15  Blood pressure 100/70, pulse 80, weight 167 lb (75.751 kg), last menstrual period 05/31/2014.   BP weight and urine results all reviewed and noted.  Please refer to the obstetrical flow sheet for the fundal height and fetal heart rate documentation:  Patient reports good fetal movement, denies any bleeding and no rupture of membranes symptoms or regular contractions. Patient is without complaints.  Discussed saplingectomy vs BTL. Mom had breast ca, so leaning towards salpingectomy.  All questions were answered.  Plan:  Continued routine obstetrical care, rx for acyclovir 400mg  TID sent to start next week (hsv suppression)  Follow up in 2 weeks for OB appointment,

## 2015-01-26 ENCOUNTER — Telehealth: Payer: Self-pay | Admitting: Obstetrics and Gynecology

## 2015-01-26 NOTE — Telephone Encounter (Signed)
Spoke with pt. Pt has had stomach pain since Saturday pm. Also, back pain. A little diarrhea today. + baby movement. No spotting. + pressure ,off and on, since yesterday. No nausea or vomiting. I advised she may have a stomach bug. Advised to drink plenty of fluids and eat as tolerated. Pt states she is eating good. Pt states her mom has had a stomach virus. I advised to keep scheduled appt for Thursday, but if anything changes, if she starts having tightness in belly, call us back or call the after hours nurse line if office is closed. Pt voiced understanding. [place]

## 2015-01-29 ENCOUNTER — Ambulatory Visit (INDEPENDENT_AMBULATORY_CARE_PROVIDER_SITE_OTHER): Payer: Medicaid Other | Admitting: Obstetrics and Gynecology

## 2015-01-29 ENCOUNTER — Encounter: Payer: Self-pay | Admitting: Obstetrics and Gynecology

## 2015-01-29 DIAGNOSIS — Z3493 Encounter for supervision of normal pregnancy, unspecified, third trimester: Secondary | ICD-10-CM

## 2015-01-29 DIAGNOSIS — Z1389 Encounter for screening for other disorder: Secondary | ICD-10-CM

## 2015-01-29 DIAGNOSIS — Z331 Pregnant state, incidental: Secondary | ICD-10-CM

## 2015-01-29 LAB — POCT URINALYSIS DIPSTICK
Glucose, UA: NEGATIVE
Ketones, UA: NEGATIVE
Leukocytes, UA: NEGATIVE
Nitrite, UA: NEGATIVE
PROTEIN UA: NEGATIVE
RBC UA: NEGATIVE

## 2015-01-29 NOTE — Progress Notes (Signed)
Pt denies any problems or concerns at this time.  

## 2015-01-29 NOTE — Progress Notes (Signed)
Patient ID: Monica Hancock, female   DOB: 1981/04/28, 34 y.o.   MRN: [id number]  This chart was SCRIBED for Mallory Shirk, MD by Stephania Fragmin, ED Scribe. This patient was seen in room 1 and the patient's care was started at 10:52 AM.  [facility] APPT scheduled [id number] [redacted]w[redacted]d Estimated Date of Delivery: 03/07/15  Blood pressure 110/66, pulse 80, weight 169 lb (76.658 kg), last menstrual period 05/31/2014.   refer to the ob flow sheet for FH and FHR, also BP, Wt, Urine results:notable for negative  Patient reports  + good fetal movement, denies any bleeding and no rupture of membranes symptoms or regular contractions. Patient complaints: Patient reports she had abdominal pain after eating 3 days ago and a little diarrhea that resolved; she thinks it may have been a stomach virus. She has no other complaints at this time.  BP 110/66 mmHg  Pulse 80  Wt 169 lb (76.658 kg)  LMP 05/31/2014  FHR: 136 FH: 34.5 cm  Questions were answered. Assessment:  Low risk ob , routine appt  11:01 AM - Discussed postnatal contraception/sterilization options for 15 minutes with patient, including salpingectomy, tubal ligation, and Depo-Provera injection.  Plan:  Continued routine obstetrical care F/u in 2 weeks for routine OB visit  I personally performed the services described in this documentation, which was SCRIBED in my presence. The recorded information has been reviewed and considered accurate. It has ben edited as necessary during review. [name], MD

## 2015-02-06 ENCOUNTER — Telehealth: Payer: Self-pay | Admitting: *Deleted

## 2015-02-06 NOTE — Telephone Encounter (Signed)
Spoke with pt. Pt has a runny nose, headache, and sinus pressure since Wednesday. Taking tylenol and using nose spray. No fever. No wheezing. I advised to try plain Sudafed and run a cool mist humidifier in bedroom when sleeping. Advised to call Monday if no better or if worse. If she starts wheezing over the weekend, call the after hours nurse line. Pt voiced understanding. [place]

## 2015-02-12 ENCOUNTER — Ambulatory Visit (INDEPENDENT_AMBULATORY_CARE_PROVIDER_SITE_OTHER): Payer: Medicaid Other | Admitting: Obstetrics & Gynecology

## 2015-02-12 ENCOUNTER — Encounter: Payer: Self-pay | Admitting: Obstetrics & Gynecology

## 2015-02-12 VITALS — BP 110/80 | HR 76 | Temp 97.6°F | Wt 170.0 lb

## 2015-02-12 DIAGNOSIS — Z1389 Encounter for screening for other disorder: Secondary | ICD-10-CM

## 2015-02-12 DIAGNOSIS — Z3685 Encounter for antenatal screening for Streptococcus B: Secondary | ICD-10-CM

## 2015-02-12 DIAGNOSIS — Z331 Pregnant state, incidental: Secondary | ICD-10-CM

## 2015-02-12 DIAGNOSIS — Z3483 Encounter for supervision of other normal pregnancy, third trimester: Secondary | ICD-10-CM

## 2015-02-12 DIAGNOSIS — Z1159 Encounter for screening for other viral diseases: Secondary | ICD-10-CM

## 2015-02-12 DIAGNOSIS — Z118 Encounter for screening for other infectious and parasitic diseases: Secondary | ICD-10-CM

## 2015-02-12 LAB — POCT URINALYSIS DIPSTICK
Blood, UA: NEGATIVE
Glucose, UA: NEGATIVE
Ketones, UA: NEGATIVE
Leukocytes, UA: NEGATIVE
Nitrite, UA: NEGATIVE
PROTEIN UA: NEGATIVE

## 2015-02-12 LAB — OB RESULTS CONSOLE GBS: STREP GROUP B AG: NEGATIVE

## 2015-02-12 NOTE — Addendum Note (Signed)
Addended by: Doyne Keel on: 02/12/2015 11:42 AM   Modules accepted: Orders

## 2015-02-12 NOTE — Progress Notes (Signed)
[id number] [redacted]w[redacted]d Estimated Date of Delivery: 03/07/15  Blood pressure 110/80, pulse 76, temperature 97.6 F (36.4 C), weight 170 lb (77.111 kg), last menstrual period 05/31/2014.   BP weight and urine results all reviewed and noted.  Please refer to the obstetrical flow sheet for the fundal height and fetal heart rate documentation:  Patient reports good fetal movement, denies any bleeding and no rupture of membranes symptoms or regular contractions. Patient is without complaints. All questions were answered.  Plan:  Continued routine obstetrical care,   Follow up in 1 weeks for OB appointment,

## 2015-02-14 LAB — GC/CHLAMYDIA PROBE AMP
Chlamydia trachomatis, NAA: NEGATIVE
NEISSERIA GONORRHOEAE BY PCR: NEGATIVE

## 2015-02-15 ENCOUNTER — Encounter (HOSPITAL_COMMUNITY): Payer: Self-pay | Admitting: *Deleted

## 2015-02-15 ENCOUNTER — Inpatient Hospital Stay (HOSPITAL_COMMUNITY)
Admission: AD | Admit: 2015-02-15 | Discharge: 2015-02-15 | Disposition: A | Discharge status: Home / Self Care | Payer: Medicaid Other | Source: Ambulatory Visit | Attending: Family Medicine | Admitting: Family Medicine

## 2015-02-15 DIAGNOSIS — O26899 Other specified pregnancy related conditions, unspecified trimester: Secondary | ICD-10-CM

## 2015-02-15 DIAGNOSIS — Z8632 Personal history of gestational diabetes: Secondary | ICD-10-CM

## 2015-02-15 DIAGNOSIS — O09523 Supervision of elderly multigravida, third trimester: Secondary | ICD-10-CM | POA: Insufficient documentation

## 2015-02-15 DIAGNOSIS — O9989 Other specified diseases and conditions complicating pregnancy, childbirth and the puerperium: Secondary | ICD-10-CM | POA: Insufficient documentation

## 2015-02-15 DIAGNOSIS — R109 Unspecified abdominal pain: Secondary | ICD-10-CM

## 2015-02-15 DIAGNOSIS — Z3A37 37 weeks gestation of pregnancy: Secondary | ICD-10-CM | POA: Diagnosis not present

## 2015-02-15 DIAGNOSIS — O09892 Supervision of other high risk pregnancies, second trimester: Secondary | ICD-10-CM

## 2015-02-15 DIAGNOSIS — Z3483 Encounter for supervision of other normal pregnancy, third trimester: Secondary | ICD-10-CM

## 2015-02-15 DIAGNOSIS — O09212 Supervision of pregnancy with history of pre-term labor, second trimester: Secondary | ICD-10-CM

## 2015-02-15 DIAGNOSIS — O09292 Supervision of pregnancy with other poor reproductive or obstetric history, second trimester: Secondary | ICD-10-CM

## 2015-02-15 LAB — URINALYSIS, ROUTINE W REFLEX MICROSCOPIC
Bilirubin Urine: NEGATIVE
GLUCOSE, UA: NEGATIVE mg/dL
Ketones, ur: 15 mg/dL — AB
Leukocytes, UA: NEGATIVE
Nitrite: NEGATIVE
PH: 7 (ref 5.0–8.0)
Protein, ur: NEGATIVE mg/dL
SPECIFIC GRAVITY, URINE: 1.02 (ref 1.005–1.030)
UROBILINOGEN UA: 1 mg/dL (ref 0.0–1.0)

## 2015-02-15 LAB — URINE MICROSCOPIC-ADD ON

## 2015-02-15 NOTE — Discharge Instructions (Signed)

## 2015-02-15 NOTE — MAU Provider Note (Signed)
History     CSN: [id number]  Arrival date and time: 02/15/15 1431   None     Chief Complaint  Patient presents with  . Contractions  . pelvic pressure    Abdominal Cramping This is a chronic problem. The current episode started more than 1 month ago. The onset quality is gradual. The problem occurs intermittently. The problem has been unchanged. The pain is at a severity of 4/10. The pain is mild. The quality of the pain is a sensation of fullness. The abdominal pain does not radiate.    34 y.o. [id number] @[redacted]w[redacted]d  presents to the MAU with complaint of pressure in lower abdomen and difficulty walking because of the pregnancy. Denies vaginal bleeding, contractions, LOF. Reports positive fetal movement.  Past Medical History  Diagnosis Date  . Gestational diabetes   . H/O Bell's palsy     Past Surgical History  Procedure Laterality Date  . No past surgeries      Family History  Problem Relation Age of Onset  . Diabetes Mother   . Cancer Mother     breast  . Diabetes Maternal Grandmother   . Cancer Maternal Grandfather     History  Substance Use Topics  . Smoking status: Never Smoker   . Smokeless tobacco: Never Used  . Alcohol Use: No    Allergies: No Known Allergies  Prescriptions prior to admission  Medication Sig Dispense Refill Last Dose  . acetaminophen (TYLENOL) 325 MG tablet Take 650 mg by mouth every 6 (six) hours as needed for headache.   Past Week at Unknown time  . acyclovir (ZOVIRAX) 400 MG tablet Take 1 tablet (400 mg total) by mouth 3 (three) times daily. 90 tablet 2 02/15/2015 at Unknown time  . Phenyleph-Doxylamine-DM-APAP (ALKA SELTZER PLUS PO) Take 1 tablet by mouth 2 (two) times daily as needed (for sinus headache/congestion).    Past Week at Unknown time  . Prenatal Vit-Fe Fumarate-FA (MULTIVITAMIN-PRENATAL) 27-0.8 MG TABS tablet Take 1 tablet by mouth daily at 12 noon. 30 each 11 02/15/2015 at Unknown time  . sodium chloride (OCEAN) 0.65 % SOLN  nasal spray Place 1 spray into both nostrils as needed for congestion.   02/14/2015 at Unknown time    Review of Systems  Constitutional: Negative.   HENT: Negative.   Eyes: Negative.   Respiratory: Negative.   Cardiovascular: Negative.   Gastrointestinal: Positive for abdominal pain.  Genitourinary:       Bladder pressure  Musculoskeletal: Positive for back pain.  Skin: Negative.   Neurological: Negative.   Endo/Heme/Allergies: Negative.   Psychiatric/Behavioral: Negative.    Physical Exam   Blood pressure 125/83, pulse 93, temperature 97.4 F (36.3 C), temperature source Oral, resp. rate 18, last menstrual period 05/31/2014.  Physical Exam  Nursing note and vitals reviewed. Constitutional: She is oriented to person, place, and time. She appears well-developed and well-nourished. No distress.  HENT:  Head: Normocephalic and atraumatic.  Neck: Normal range of motion.  Cardiovascular: Normal rate.   Respiratory: Effort normal. No respiratory distress.  GI: Soft. She exhibits no distension and no mass. There is no tenderness. There is no rebound and no guarding.  Genitourinary: Vagina normal.  Musculoskeletal: Normal range of motion.  Neurological: She is alert and oriented to person, place, and time.  Skin: Skin is warm and dry.  Psychiatric: She has a normal mood and affect. Her behavior is normal. Judgment and thought content normal.     MAU Course  Procedures  MDM Ua  Results for orders placed or performed during the hospital encounter of 02/15/15 (from the past 24 hour(s))  Urinalysis, Routine w reflex microscopic     Status: Abnormal   Collection Time: 02/15/15  2:36 PM  Result Value Ref Range   Color, Urine YELLOW YELLOW   APPearance CLEAR CLEAR   Specific Gravity, Urine 1.020 1.005 - 1.030   pH 7.0 5.0 - 8.0   Glucose, UA NEGATIVE NEGATIVE mg/dL   Hgb urine dipstick TRACE (A) NEGATIVE   Bilirubin Urine NEGATIVE NEGATIVE   Ketones, ur 15 (A) NEGATIVE mg/dL    Protein, ur NEGATIVE NEGATIVE mg/dL   Urobilinogen, UA 1.0 0.0 - 1.0 mg/dL   Nitrite NEGATIVE NEGATIVE   Leukocytes, UA NEGATIVE NEGATIVE  Urine microscopic-add on     Status: Abnormal   Collection Time: 02/15/15  2:36 PM  Result Value Ref Range   Squamous Epithelial / LPF FEW (A) RARE   RBC / HPF 0-2 <3 RBC/hpf   Urine-Other MUCOUS PRESENT    Dilation: 2 Effacement (%): Thick Cervical Position: Middle Station: -3 Presentation: Vertex Exam by:: A. gagliardo, RN Dilation: 2 Effacement (%): Thick Cervical Position: Middle Station: -3 Presentation: Vertex Exam by:: A. gagliardo, RN FHT's 125 baseline Reactive Cat 1 Assessment and Plan  Abdominal Pain  Drink Fluids to PO hydrate D/C to home with labor precautions  Monica Hancock 02/15/2015, 3:36 PM

## 2015-02-15 NOTE — MAU Note (Signed)
Started contracting a few days ago, having a lot of pelvic pressure, hurts to walk.  Denies LOF/VB.

## 2015-02-16 LAB — CULTURE, BETA STREP (GROUP B ONLY): Strep Gp B Culture: NEGATIVE

## 2015-02-20 ENCOUNTER — Encounter: Payer: Self-pay | Admitting: Obstetrics & Gynecology

## 2015-02-20 ENCOUNTER — Ambulatory Visit (INDEPENDENT_AMBULATORY_CARE_PROVIDER_SITE_OTHER): Payer: Medicaid Other | Admitting: Obstetrics & Gynecology

## 2015-02-20 VITALS — BP 100/70 | HR 72 | Wt 172.0 lb

## 2015-02-20 DIAGNOSIS — Z331 Pregnant state, incidental: Secondary | ICD-10-CM

## 2015-02-20 DIAGNOSIS — Z3493 Encounter for supervision of normal pregnancy, unspecified, third trimester: Secondary | ICD-10-CM

## 2015-02-20 DIAGNOSIS — Z1389 Encounter for screening for other disorder: Secondary | ICD-10-CM

## 2015-02-20 LAB — POCT URINALYSIS DIPSTICK
Blood, UA: NEGATIVE
Glucose, UA: NEGATIVE
Ketones, UA: NEGATIVE
Leukocytes, UA: NEGATIVE
Nitrite, UA: NEGATIVE
Protein, UA: NEGATIVE

## 2015-02-20 NOTE — Progress Notes (Signed)
[id number] [redacted]w[redacted]d Estimated Date of Delivery: 03/07/15  Blood pressure 100/70, pulse 72, weight 172 lb (78.019 kg), last menstrual period 05/31/2014.   BP weight and urine results all reviewed and noted.  Please refer to the obstetrical flow sheet for the fundal height and fetal heart rate documentation:  Patient reports good fetal movement, denies any bleeding and no rupture of membranes symptoms or regular contractions. Patient is without complaints. All questions were answered.  Plan:  Continued routine obstetrical care,   Follow up in 1 weeks for OB appointment,

## 2015-02-25 ENCOUNTER — Encounter (HOSPITAL_COMMUNITY): Payer: Self-pay | Admitting: *Deleted

## 2015-02-25 ENCOUNTER — Inpatient Hospital Stay (HOSPITAL_COMMUNITY)
Admission: AD | Admit: 2015-02-25 | Discharge: 2015-02-25 | Disposition: A | Discharge status: Home / Self Care | Payer: Medicaid Other | Source: Ambulatory Visit | Attending: Obstetrics and Gynecology | Admitting: Obstetrics and Gynecology

## 2015-02-25 DIAGNOSIS — Z8632 Personal history of gestational diabetes: Secondary | ICD-10-CM

## 2015-02-25 DIAGNOSIS — O09893 Supervision of other high risk pregnancies, third trimester: Secondary | ICD-10-CM

## 2015-02-25 DIAGNOSIS — O09293 Supervision of pregnancy with other poor reproductive or obstetric history, third trimester: Secondary | ICD-10-CM

## 2015-02-25 DIAGNOSIS — Z3483 Encounter for supervision of other normal pregnancy, third trimester: Secondary | ICD-10-CM

## 2015-02-25 DIAGNOSIS — O26899 Other specified pregnancy related conditions, unspecified trimester: Secondary | ICD-10-CM

## 2015-02-25 DIAGNOSIS — R109 Unspecified abdominal pain: Secondary | ICD-10-CM

## 2015-02-25 DIAGNOSIS — O09213 Supervision of pregnancy with history of pre-term labor, third trimester: Secondary | ICD-10-CM

## 2015-02-25 DIAGNOSIS — Z3A39 39 weeks gestation of pregnancy: Secondary | ICD-10-CM | POA: Insufficient documentation

## 2015-02-25 MED ORDER — FAMOTIDINE 20 MG PO TABS
20.0000 mg | ORAL_TABLET | Freq: Once | ORAL | Status: AC
  Administered 2015-02-25: 20 mg via ORAL
  Filled 2015-02-25: qty 1

## 2015-02-25 MED ORDER — OXYCODONE-ACETAMINOPHEN 5-325 MG PO TABS
2.0000 | ORAL_TABLET | Freq: Once | ORAL | Status: AC
  Administered 2015-02-25: 2 via ORAL
  Filled 2015-02-25: qty 2

## 2015-02-25 NOTE — MAU Note (Signed)
Pt states she is feeling contractions q 30secs and  Contractions are lasting 10sec.

## 2015-02-25 NOTE — Progress Notes (Signed)
Pt states she has a headache that she rates a 5" probably because I haven t ate nothing"

## 2015-02-25 NOTE — Discharge Instructions (Signed)
Third Trimester of Pregnancy The third trimester is from week 29 through week 42, months 7 through 9. The third trimester is a time when the fetus is growing rapidly. At the end of the ninth month, the fetus is about 20 inches in length and weighs 6-10 pounds.  BODY CHANGES Your body goes through many changes during pregnancy. The changes vary from woman to woman.   Your weight will continue to increase. You can expect to gain 25-35 pounds (11-16 kg) by the end of the pregnancy.  You may begin to get stretch marks on your hips, abdomen, and breasts.  You may urinate more often because the fetus is moving lower into your pelvis and pressing on your bladder.  You may develop or continue to have heartburn as a result of your pregnancy.  You may develop constipation because certain hormones are causing the muscles that push waste through your intestines to slow down.  You may develop hemorrhoids or swollen, bulging veins (varicose veins).  You may have pelvic pain because of the weight gain and pregnancy hormones relaxing your joints between the bones in your pelvis. Backaches may result from overexertion of the muscles supporting your posture.  You may have changes in your hair. These can include thickening of your hair, rapid growth, and changes in texture. Some women also have hair loss during or after pregnancy, or hair that feels dry or thin. Your hair will most likely return to normal after your baby is born.  Your breasts will continue to grow and be tender. A yellow discharge may leak from your breasts called colostrum.  Your belly button may stick out.  You may feel short of breath because of your expanding uterus.  You may notice the fetus "dropping," or moving lower in your abdomen.  You may have a bloody mucus discharge. This usually occurs a few days to a week before labor begins.  Your cervix becomes thin and soft (effaced) near your due date. WHAT TO EXPECT AT YOUR PRENATAL  EXAMS  You will have prenatal exams every 2 weeks until week 36. Then, you will have weekly prenatal exams. During a routine prenatal visit:  You will be weighed to make sure you and the fetus are growing normally.  Your blood pressure is taken.  Your abdomen will be measured to track your baby's growth.  The fetal heartbeat will be listened to.  Any test results from the previous visit will be discussed.  You may have a cervical check near your due date to see if you have effaced. At around 36 weeks, your caregiver will check your cervix. At the same time, your caregiver will also perform a test on the secretions of the vaginal tissue. This test is to determine if a type of bacteria, Group B streptococcus, is present. Your caregiver will explain this further. Your caregiver may ask you:  What your birth plan is.  How you are feeling.  If you are feeling the baby move.  If you have had any abnormal symptoms, such as leaking fluid, bleeding, severe headaches, or abdominal cramping.  If you have any questions. Other tests or screenings that may be performed during your third trimester include:  Blood tests that check for low iron levels (anemia).  Fetal testing to check the health, activity level, and growth of the fetus. Testing is done if you have certain medical conditions or if there are problems during the pregnancy. FALSE LABOR You may feel small, irregular contractions that  eventually go away. These are called Braxton Hicks contractions, or false labor. Contractions may last for hours, days, or even weeks before true labor sets in. If contractions come at regular intervals, intensify, or become painful, it is best to be seen by your caregiver.  SIGNS OF LABOR   Menstrual-like cramps.  Contractions that are 5 minutes apart or less.  Contractions that start on the top of the uterus and spread down to the lower abdomen and back.  A sense of increased pelvic pressure or back  pain.  A watery or bloody mucus discharge that comes from the vagina. If you have any of these signs before the 37th week of pregnancy, call your caregiver right away. You need to go to the hospital to get checked immediately. HOME CARE INSTRUCTIONS   Avoid all smoking, herbs, alcohol, and unprescribed drugs. These chemicals affect the formation and growth of the baby.  Follow your caregiver's instructions regarding medicine use. There are medicines that are either safe or unsafe to take during pregnancy.  Exercise only as directed by your caregiver. Experiencing uterine cramps is a good sign to stop exercising.  Continue to eat regular, healthy meals.  Wear a good support bra for breast tenderness.  Do not use hot tubs, steam rooms, or saunas.  Wear your seat belt at all times when driving.  Avoid raw meat, uncooked cheese, cat litter boxes, and soil used by cats. These carry germs that can cause birth defects in the baby.  Take your prenatal vitamins.  Try taking a stool softener (if your caregiver approves) if you develop constipation. Eat more high-fiber foods, such as fresh vegetables or fruit and whole grains. Drink plenty of fluids to keep your urine clear or pale yellow.  Take warm sitz baths to soothe any pain or discomfort caused by hemorrhoids. Use hemorrhoid cream if your caregiver approves.  If you develop varicose veins, wear support hose. Elevate your feet for 15 minutes, 3-4 times a day. Limit salt in your diet.  Avoid heavy lifting, wear low heal shoes, and practice good posture.  Rest a lot with your legs elevated if you have leg cramps or low back pain.  Visit your dentist if you have not gone during your pregnancy. Use a soft toothbrush to brush your teeth and be gentle when you floss.  A sexual relationship may be continued unless your caregiver directs you otherwise.  Do not travel far distances unless it is absolutely necessary and only with the approval  of your caregiver.  Take prenatal classes to understand, practice, and ask questions about the labor and delivery.  Make a trial run to the hospital.  Pack your hospital bag.  Prepare the baby's nursery.  Continue to go to all your prenatal visits as directed by your caregiver. SEEK MEDICAL CARE IF:  You are unsure if you are in labor or if your water has broken.  You have dizziness.  You have mild pelvic cramps, pelvic pressure, or nagging pain in your abdominal area.  You have persistent nausea, vomiting, or diarrhea.  You have a bad smelling vaginal discharge.  You have pain with urination. SEEK IMMEDIATE MEDICAL CARE IF:   You have a fever.  You are leaking fluid from your vagina.  You have spotting or bleeding from your vagina.  You have severe abdominal cramping or pain.  You have rapid weight loss or gain.  You have shortness of breath with chest pain.  You notice sudden or extreme swelling  of your face, hands, ankles, feet, or legs.  You have not felt your baby move in over an hour.  You have severe headaches that do not go away with medicine.  You have vision changes. Document Released: 11/01/2001 Document Revised: 11/12/2013 Document Reviewed: 01/08/2013 [facility] Patient Information 2015 [place], [place]. This information is not intended to replace advice given to you by your health care provider. Make sure you discuss any questions you have with your health care provider. Fetal Movement Counts Patient Name: __________________________________________________ Patient Due Date: ____________________ Performing a fetal movement count is highly recommended in high-risk pregnancies, but it is good for every pregnant woman to do. Your health care provider may ask you to start counting fetal movements at 28 weeks of the pregnancy. Fetal movements often increase:  After eating a full meal.  After physical activity.  After eating or drinking something sweet or  cold.  At rest. Pay attention to when you feel the baby is most active. This will help you notice a pattern of your baby's sleep and wake cycles and what factors contribute to an increase in fetal movement. It is important to perform a fetal movement count at the same time each day when your baby is normally most active.  HOW TO COUNT FETAL MOVEMENTS  Find a quiet and comfortable area to sit or lie down on your left side. Lying on your left side provides the best blood and oxygen circulation to your baby.  Write down the day and time on a sheet of paper or in a journal.  Start counting kicks, flutters, swishes, rolls, or jabs in a 2-hour period. You should feel at least 10 movements within 2 hours.  If you do not feel 10 movements in 2 hours, wait 2-3 hours and count again. Look for a change in the pattern or not enough counts in 2 hours. SEEK MEDICAL CARE IF:  You feel less than 10 counts in 2 hours, tried twice.  There is no movement in over an hour.  The pattern is changing or taking longer each day to reach 10 counts in 2 hours.  You feel the baby is not moving as he or she usually does. Date: ____________ Movements: ____________ Start time: ____________ Elizebeth Koller time: ____________  Date: ____________ Movements: ____________ Start time: ____________ Elizebeth Koller time: ____________ Date: ____________ Movements: ____________ Start time: ____________ Elizebeth Koller time: ____________ Date: ____________ Movements: ____________ Start time: ____________ Elizebeth Koller time: ____________ Date: ____________ Movements: ____________ Start time: ____________ Elizebeth Koller time: ____________ Date: ____________ Movements: ____________ Start time: ____________ Elizebeth Koller time: ____________ Date: ____________ Movements: ____________ Start time: ____________ Elizebeth Koller time: ____________ Date: ____________ Movements: ____________ Start time: ____________ Elizebeth Koller time: ____________  Date: ____________ Movements: ____________ Start time:  ____________ Elizebeth Koller time: ____________ Date: ____________ Movements: ____________ Start time: ____________ Elizebeth Koller time: ____________ Date: ____________ Movements: ____________ Start time: ____________ Elizebeth Koller time: ____________ Date: ____________ Movements: ____________ Start time: ____________ Elizebeth Koller time: ____________ Date: ____________ Movements: ____________ Start time: ____________ Elizebeth Koller time: ____________ Date: ____________ Movements: ____________ Start time: ____________ Elizebeth Koller time: ____________ Date: ____________ Movements: ____________ Start time: ____________ Elizebeth Koller time: ____________  Date: ____________ Movements: ____________ Start time: ____________ Elizebeth Koller time: ____________ Date: ____________ Movements: ____________ Start time: ____________ Elizebeth Koller time: ____________ Date: ____________ Movements: ____________ Start time: ____________ Elizebeth Koller time: ____________ Date: ____________ Movements: ____________ Start time: ____________ Elizebeth Koller time: ____________ Date: ____________ Movements: ____________ Start time: ____________ Elizebeth Koller time: ____________ Date: ____________ Movements: ____________ Start time: ____________ Elizebeth Koller time: ____________ Date: ____________ Movements: ____________ Start time: ____________ Elizebeth Koller time:  ____________  Date: ____________ Movements: ____________ Start time: ____________ Elizebeth Koller time: ____________ Date: ____________ Movements: ____________ Start time: ____________ Elizebeth Koller time: ____________ Date: ____________ Movements: ____________ Start time: ____________ Elizebeth Koller time: ____________ Date: ____________ Movements: ____________ Start time: ____________ Elizebeth Koller time: ____________ Date: ____________ Movements: ____________ Start time: ____________ Elizebeth Koller time: ____________ Date: ____________ Movements: ____________ Start time: ____________ Elizebeth Koller time: ____________ Date: ____________ Movements: ____________ Start time: ____________ Elizebeth Koller time: ____________  Date:  ____________ Movements: ____________ Start time: ____________ Elizebeth Koller time: ____________ Date: ____________ Movements: ____________ Start time: ____________ Elizebeth Koller time: ____________ Date: ____________ Movements: ____________ Start time: ____________ Elizebeth Koller time: ____________ Date: ____________ Movements: ____________ Start time: ____________ Elizebeth Koller time: ____________ Date: ____________ Movements: ____________ Start time: ____________ Elizebeth Koller time: ____________ Date: ____________ Movements: ____________ Start time: ____________ Elizebeth Koller time: ____________ Date: ____________ Movements: ____________ Start time: ____________ Elizebeth Koller time: ____________  Date: ____________ Movements: ____________ Start time: ____________ Elizebeth Koller time: ____________ Date: ____________ Movements: ____________ Start time: ____________ Elizebeth Koller time: ____________ Date: ____________ Movements: ____________ Start time: ____________ Elizebeth Koller time: ____________ Date: ____________ Movements: ____________ Start time: ____________ Elizebeth Koller time: ____________ Date: ____________ Movements: ____________ Start time: ____________ Elizebeth Koller time: ____________ Date: ____________ Movements: ____________ Start time: ____________ Elizebeth Koller time: ____________ Date: ____________ Movements: ____________ Start time: ____________ Elizebeth Koller time: ____________  Date: ____________ Movements: ____________ Start time: ____________ Elizebeth Koller time: ____________ Date: ____________ Movements: ____________ Start time: ____________ Elizebeth Koller time: ____________ Date: ____________ Movements: ____________ Start time: ____________ Elizebeth Koller time: ____________ Date: ____________ Movements: ____________ Start time: ____________ Elizebeth Koller time: ____________ Date: ____________ Movements: ____________ Start time: ____________ Elizebeth Koller time: ____________ Date: ____________ Movements: ____________ Start time: ____________ Elizebeth Koller time: ____________ Date: ____________ Movements: ____________ Start  time: ____________ Elizebeth Koller time: ____________  Date: ____________ Movements: ____________ Start time: ____________ Elizebeth Koller time: ____________ Date: ____________ Movements: ____________ Start time: ____________ Elizebeth Koller time: ____________ Date: ____________ Movements: ____________ Start time: ____________ Elizebeth Koller time: ____________ Date: ____________ Movements: ____________ Start time: ____________ Elizebeth Koller time: ____________ Date: ____________ Movements: ____________ Start time: ____________ Elizebeth Koller time: ____________ Date: ____________ Movements: ____________ Start time: ____________ Elizebeth Koller time: ____________ Document Released: 12/07/2006 Document Revised: 03/24/2014 Document Reviewed: 09/03/2012 [employer] Patient Information 2015 [employer]. This information is not intended to replace advice given to you by your health care provider. Make sure you discuss any questions you have with your health care provider.

## 2015-02-25 NOTE — MAU Note (Signed)
Stomach-ache, comes and goes since Sunday, upper stomach.  Brief contractions lower abd since last night.  No bleeding or leaking.  Was 2cm 2 wks ago.

## 2015-02-25 NOTE — Progress Notes (Signed)
Pt states she feels better now she feels like she can eat and rest.

## 2015-02-25 NOTE — MAU Note (Signed)
Pt. Urine in lab 

## 2015-03-02 ENCOUNTER — Ambulatory Visit (INDEPENDENT_AMBULATORY_CARE_PROVIDER_SITE_OTHER): Payer: Medicaid Other | Admitting: Obstetrics & Gynecology

## 2015-03-02 ENCOUNTER — Encounter: Payer: Self-pay | Admitting: Obstetrics & Gynecology

## 2015-03-02 VITALS — BP 118/70 | HR 72 | Wt 171.4 lb

## 2015-03-02 DIAGNOSIS — Z1389 Encounter for screening for other disorder: Secondary | ICD-10-CM

## 2015-03-02 DIAGNOSIS — Z331 Pregnant state, incidental: Secondary | ICD-10-CM

## 2015-03-02 DIAGNOSIS — Z3483 Encounter for supervision of other normal pregnancy, third trimester: Secondary | ICD-10-CM

## 2015-03-02 LAB — POCT URINALYSIS DIPSTICK
Glucose, UA: NEGATIVE
Ketones, UA: NEGATIVE
Leukocytes, UA: NEGATIVE
Nitrite, UA: NEGATIVE
Protein, UA: NEGATIVE

## 2015-03-02 NOTE — Progress Notes (Signed)
[id number] [redacted]w[redacted]d Estimated Date of Delivery: 03/07/15  Blood pressure 118/70, pulse 72, weight 171 lb 6.4 oz (77.747 kg), last menstrual period 05/31/2014.   BP weight and urine results all reviewed and noted.  Please refer to the obstetrical flow sheet for the fundal height and fetal heart rate documentation:  Patient reports good fetal movement, denies any bleeding and no rupture of membranes symptoms or regular contractions. Patient is without complaints. All questions were answered.  Plan:  Continued routine obstetrical care,   Follow up in 1 weeks for OB appointment, routine

## 2015-03-03 ENCOUNTER — Encounter (HOSPITAL_COMMUNITY): Payer: Self-pay | Admitting: *Deleted

## 2015-03-03 ENCOUNTER — Inpatient Hospital Stay (HOSPITAL_COMMUNITY)
Admission: EM | Admit: 2015-03-03 | Discharge: 2015-03-03 | Disposition: A | Discharge status: Home / Self Care | Payer: Medicaid Other | Source: Ambulatory Visit | Attending: Family Medicine | Admitting: Family Medicine

## 2015-03-03 DIAGNOSIS — Z3A39 39 weeks gestation of pregnancy: Secondary | ICD-10-CM | POA: Insufficient documentation

## 2015-03-03 HISTORY — DX: Herpesviral infection, unspecified: B00.9

## 2015-03-03 NOTE — MAU Note (Signed)
Contractions started at 0600, now every 2 min.  Was 2cm (still) yesterday in office.  Had lost mucous plug.

## 2015-03-03 NOTE — Discharge Instructions (Signed)
Keep scheduled appointment for prenatal care.

## 2015-03-03 NOTE — Progress Notes (Signed)
States Dr. Elonda Husky stripped membranes yesterday and was 2cm

## 2015-03-09 ENCOUNTER — Encounter: Payer: Self-pay | Admitting: Obstetrics & Gynecology

## 2015-03-09 ENCOUNTER — Ambulatory Visit (INDEPENDENT_AMBULATORY_CARE_PROVIDER_SITE_OTHER): Payer: Medicaid Other | Admitting: Obstetrics & Gynecology

## 2015-03-09 VITALS — BP 110/70 | HR 72 | Wt 169.4 lb

## 2015-03-09 DIAGNOSIS — O09523 Supervision of elderly multigravida, third trimester: Secondary | ICD-10-CM

## 2015-03-09 DIAGNOSIS — Z331 Pregnant state, incidental: Secondary | ICD-10-CM

## 2015-03-09 DIAGNOSIS — Z3483 Encounter for supervision of other normal pregnancy, third trimester: Secondary | ICD-10-CM

## 2015-03-09 DIAGNOSIS — Z1389 Encounter for screening for other disorder: Secondary | ICD-10-CM

## 2015-03-09 LAB — POCT URINALYSIS DIPSTICK
GLUCOSE UA: NEGATIVE
KETONES UA: NEGATIVE
Leukocytes, UA: NEGATIVE
Nitrite, UA: NEGATIVE
Protein, UA: NEGATIVE
RBC UA: NEGATIVE

## 2015-03-09 NOTE — Progress Notes (Signed)
[id number] [redacted]w[redacted]d Estimated Date of Delivery: 03/07/15  Blood pressure 110/70, pulse 72, weight 169 lb 6.4 oz (76.839 kg), last menstrual period 05/31/2014.   BP weight and urine results all reviewed and noted.  Please refer to the obstetrical flow sheet for the fundal height and fetal heart rate documentation:  Patient reports good fetal movement, denies any bleeding and no rupture of membranes symptoms or regular contractions. Patient is without complaints. All questions were answered.  Plan:  Continued routine obstetrical care,   Follow up in 6 weeks for OB appointment, pp exam  Induction 03/14/2015: choice with thick cervix recommend cytotec to to start them foly

## 2015-03-10 ENCOUNTER — Telehealth (HOSPITAL_COMMUNITY): Payer: Self-pay | Admitting: *Deleted

## 2015-03-10 ENCOUNTER — Encounter (HOSPITAL_COMMUNITY): Payer: Self-pay | Admitting: *Deleted

## 2015-03-10 NOTE — Telephone Encounter (Signed)
Preadmission screen  

## 2015-03-14 ENCOUNTER — Inpatient Hospital Stay (HOSPITAL_COMMUNITY): Admission: RE | Admit: 2015-03-14 | Payer: Medicaid Other | Source: Ambulatory Visit

## 2015-04-21 ENCOUNTER — Encounter: Payer: Self-pay | Admitting: *Deleted

## 2015-04-21 ENCOUNTER — Ambulatory Visit (INDEPENDENT_AMBULATORY_CARE_PROVIDER_SITE_OTHER): Payer: Medicaid Other | Admitting: Women's Health

## 2015-04-21 DIAGNOSIS — N390 Urinary tract infection, site not specified: Secondary | ICD-10-CM

## 2015-04-21 DIAGNOSIS — R350 Frequency of micturition: Secondary | ICD-10-CM

## 2015-04-21 LAB — POCT URINALYSIS DIPSTICK
Glucose, UA: NEGATIVE
Ketones, UA: NEGATIVE
Leukocytes, UA: NEGATIVE
NITRITE UA: POSITIVE
Protein, UA: NEGATIVE
RBC UA: NEGATIVE

## 2015-04-21 MED ORDER — CEPHALEXIN 500 MG PO CAPS: 500.0000 mg | ORAL_CAPSULE | Freq: Four times a day (QID) | ORAL | Status: DC

## 2015-04-21 NOTE — Progress Notes (Addendum)
Patient ID: Monica Hancock, female   DOB: 1980/12/17, 34 y.o.   MRN: [id number] Subjective:    Monica Hancock is a 34 y.o. [id number] Hispanic female who presents for a postpartum visit. She is 6 weeks postpartum following a spontaneous vaginal delivery at 40+ gestational weeks at [facility], felt like pain was too bad to make it to whog, was 3cm when she got there. Anesthesia: epidural. I have fully reviewed the prenatal and intrapartum course. Postpartum course has been uncomplicated. Baby's course has been uncomplicated. Baby is feeding by breast. Bleeding no bleeding. Bowel function is normal. Bladder function is normal, although strong odor & dark. No uti s/s. Patient is not sexually active. Last sexual activity: prior to birth of baby. Contraception method is s/p BTL at [facility] on 03/11/15. Postpartum depression screening: negative. Score 0.  Last pap April 2015 at [facility] and was neg.  The following portions of the patient's history were reviewed and updated as appropriate: allergies, current medications, past medical history, past surgical history and problem list.  Review of Systems Pertinent items are noted in HPI.   Filed Vitals:   04/21/15 0944  BP: 136/84  Pulse: 68  Weight: 152 lb (68.947 kg)   Patient's last menstrual period was 05/31/2014.  Objective:   General:  alert, cooperative and no distress   Breasts:  deferred, no complaints  Lungs: clear to auscultation bilaterally  Heart:  regular rate and rhythm  Abdomen: soft, nontender   Vulva: normal  Vagina: normal vagina  Cervix:  closed  Corpus: Well-involuted  Adnexa:  Non-palpable  Rectal Exam: No hemorrhoids        Urine dip: +nitrites  Assessment:   Postpartum exam 6 wks s/p SVB and BTL at [facility] Breastfeeding Depression screening Contraception counseling  UTI  Plan:  Rx keflex qid x 7d for uti breastfeeding Send urine cx Increase po fluids til urine clear Contraception: s/p BTL Follow up in: 1 year for  physical or earlier if needed  Tawnya Crook CNM, WHNP-BC 04/21/2015 10:00 AM

## 2015-04-23 LAB — URINE CULTURE

## 2015-10-05 ENCOUNTER — Other Ambulatory Visit (HOSPITAL_COMMUNITY): Payer: Self-pay | Admitting: Nurse Practitioner

## 2015-10-05 DIAGNOSIS — Z1231 Encounter for screening mammogram for malignant neoplasm of breast: Secondary | ICD-10-CM

## 2015-10-22 ENCOUNTER — Ambulatory Visit (HOSPITAL_COMMUNITY)
Admission: RE | Admit: 2015-10-22 | Discharge: 2015-10-22 | Disposition: A | Discharge status: Home / Self Care | Payer: Self-pay | Source: Ambulatory Visit | Attending: Nurse Practitioner | Admitting: Nurse Practitioner

## 2015-10-22 DIAGNOSIS — Z1231 Encounter for screening mammogram for malignant neoplasm of breast: Secondary | ICD-10-CM

## 2016-04-28 ENCOUNTER — Other Ambulatory Visit (HOSPITAL_COMMUNITY): Payer: Self-pay

## 2016-04-28 ENCOUNTER — Encounter (HOSPITAL_COMMUNITY): Payer: Self-pay | Attending: Genetic Counselor | Admitting: Genetic Counselor

## 2016-04-28 DIAGNOSIS — Z803 Family history of malignant neoplasm of breast: Secondary | ICD-10-CM

## 2016-04-28 DIAGNOSIS — Z8481 Family history of carrier of genetic disease: Secondary | ICD-10-CM

## 2016-04-28 DIAGNOSIS — Z315 Encounter for genetic counseling: Secondary | ICD-10-CM

## 2016-04-29 ENCOUNTER — Encounter (HOSPITAL_COMMUNITY): Payer: Self-pay | Admitting: Genetic Counselor

## 2016-04-29 DIAGNOSIS — Z8481 Family history of carrier of genetic disease: Secondary | ICD-10-CM | POA: Insufficient documentation

## 2016-04-29 DIAGNOSIS — Z803 Family history of malignant neoplasm of breast: Secondary | ICD-10-CM | POA: Insufficient documentation

## 2016-04-29 NOTE — Progress Notes (Signed)
REFERRING PROVIDER: Florian Buff, MD [place], [place] [address]   Ancil Linsey, MD  PRIMARY PROVIDER:  Florian Buff, MD  PRIMARY REASON FOR VISIT:  1. Family history of BRCA1 gene positive   2. Family history of breast cancer      HISTORY OF PRESENT ILLNESS:   Ms. Erlandson, a 35 y.o. female, was seen for a [place] cancer genetics consultation at the request of Dr. Elonda Husky due to a family history of cancer.  Ms. Meneely presents to clinic today to discuss the possibility of a hereditary predisposition to cancer, genetic testing, and to further clarify her future cancer risks, as well as potential cancer risks for family members.  Ms. Haith is a 35 y.o. female with no personal history of cancer.  Ms. Lafoy mother was diagnosed with breast cancer at age 39.  She underwent genetic testing and was found to have a BRCA1 mutation.  Ms. Greear attended the visit to learn about the results of her genetic testing and decided to pursue genetic testing as well.  CANCER HISTORY:   No history exists.      Past Medical History  Diagnosis Date  . H/O Bell's palsy   . HSV infection   . Family history of breast cancer   . Family history of BRCA1 gene positive     Past Surgical History  Procedure Laterality Date  . No past surgeries    . Tubal ligation      Social History   Social History  . Marital Status: Married    Spouse Name: N/A  . Number of Children: N/A  . Years of Education: N/A   Social History Main Topics  . Smoking status: Never Smoker   . Smokeless tobacco: Never Used  . Alcohol Use: No  . Drug Use: No  . Sexual Activity: Yes    Birth Control/ Protection: None   Other Topics Concern  . None   Social History Narrative     FAMILY HISTORY:  We obtained a detailed, 4-generation family history.  Significant diagnoses are listed below: Family History  Problem Relation Age of Onset  . Diabetes Mother   . Breast cancer Mother 76   . Diabetes Maternal Grandmother   . Stomach cancer Maternal Grandfather   . Breast cancer Maternal Aunt     dx in her 68s  . Stomach cancer Maternal Aunt     The patient has five maternal sisters who are healthy.  Her mother is alive and was recently diagnosed with a BRCA1 mutation.  Her mother has seven siblings.  One sister had breast cancer in her 30s and another sister had stomach cancer.  The patient's maternal grandfather was diagnosed with stomach cancer and died in his 62s.  Patient's maternal ancestors are of Hispanic descent, and paternal ancestors are of Hispanic descent. There is no reported Ashkenazi Jewish ancestry. There is no known consanguinity.  GENETIC COUNSELING ASSESSMENT: TERRICA DUECKER is a 35 y.o. female with a family history of early onset breast cancer and a known family mutation in [place] which is somewhat suggestive of a hereditary breast and ovarian cancer syndrome. We, therefore, discussed and recommended the following at today's visit.   DISCUSSION: We discussed that about 5-10% of breast cancer is hereditary with most cases due to BRCA mutations.  The patient's mother underwent genetic testing and was found to carry a large deletion in [place].  Women with BRCA mutations are at an increased risk for  breast and ovarian cancer as well as a small increased risk for pancreatic cancer.  Men also have an increased risk for female breast cancer and prostate cancer.  It is important to identify both men and women who carry these mutations so that we can appropriately screen them, or provide propylactic surgery, in order to prevent a cancer from occuring or identify a cancer when it is more treatable.    NCCN guidelines were reviewed regarding screening and management should the patient be positive.  Ms. Hukill has a 50% chance of also having the BRCA1 mutation found in her mother.  Ms. Doffing voiced her desire to undergo genetic testing.  We reviewed the characteristics,  features and inheritance patterns of hereditary cancer syndromes. We also discussed genetic testing, including the appropriate family members to test, the process of testing, insurance coverage and turn-around-time for results. We discussed the implications of a negative, positive and/or variant of uncertain significant result. We recommended Ms. Scarfo pursue genetic testing for the BRCA1 gene.   Based on Ms. Laiche's family history of cancer, she meets medical criteria for genetic testing. Ms. Epler does not have health insurance and therefore we will go through [facility] program for testing.  As part of this testing, she will be tested for the full [facility] panel genes.  The [facility] gene panel offered by [employer] includes sequencing and deletion/duplication testing of the following 27 genes: APC, ATM, BARD1, BMPR1A, BRCA1, BRCA2, BRIP1, CHD1, CDK4, CDKN2A, CHEK2, EPCAM (large rearrangement only), MLH1, MSH2, MSH6, MUTYH, NBN, PALB2, PMS2, PTEN, RAD51C, RAD51D, SMAD4, STK11, and TP53. Sequencing was performed for select regions of POLE and POLD1, and large rearrangement analysis was performed for select regions of GREM1.   PLAN: After considering the risks, benefits, and limitations, Ms. Tappan  provided informed consent to pursue genetic testing and the blood sample was sent to [facility] for analysis of the BRCA1 gene mutation and also the [facility] panel. Results should be available within approximately 2-3 weeks' time, at which point they will be disclosed by telephone to Ms. Roe, as will any additional recommendations warranted by these results. Ms. Hamler will receive a summary of her genetic counseling visit and a copy of her results once available. This information will also be available in Epic. We encouraged Ms. Coglianese to remain in contact with cancer genetics annually so that we can continuously update the family  history and inform her of any changes in cancer genetics and testing that may be of benefit for her family. Ms. Giel questions were answered to her satisfaction today. Our contact information was provided should additional questions or concerns arise.  Lastly, we encouraged Ms. Candy to remain in contact with cancer genetics annually so that we can continuously update the family history and inform her of any changes in cancer genetics and testing that may be of benefit for this family.   Ms.  Wareing questions were answered to her satisfaction today. Our contact information was provided should additional questions or concerns arise. Thank you for the referral and allowing [place] to share in the care of your patient.   Tristy Udovich P. Florene Glen, [place], [facility] Certified Genetic Counselor [email or web address] phone: [phone]  The patient was seen for a total of 30 minutes in face-to-face genetic counseling.  This patient was discussed with Drs. Magrinat, Lindi Adie and/or Burr Medico who agrees with the above.    _______________________________________________________________________ For Office Staff:  Number of people involved in session: 6 Was an  Intern/ student involved with case: no

## 2016-05-05 ENCOUNTER — Telehealth: Payer: Self-pay | Admitting: Genetic Counselor

## 2016-05-05 NOTE — Telephone Encounter (Signed)
Contacted Monica Hancock about her genetic testing that was performed last week.  The lab no longer allows [place] to state that tax returns are not available b/c the patient did not know to bring them.  Is there a way to get a copy of the tax return?  She stated that she can bring a copy of it to [facility] and they could fax it to me. She does not have a car today, but could do tomorrow or next week.  I gave her my fax number and will email Amy Nance with my fax number of [phone].

## 2016-05-12 ENCOUNTER — Telehealth: Payer: Self-pay | Admitting: Genetic Counselor

## 2016-05-12 NOTE — Telephone Encounter (Signed)
LM on VM telling her I was following up on our conversation from last week.  Asked that she CB.

## 2016-05-13 ENCOUNTER — Telehealth: Payer: Self-pay | Admitting: Genetic Counselor

## 2016-05-13 NOTE — Telephone Encounter (Signed)
Spoke with Ameirah about trying to get her forms in.  She states that they recently moved and she has been looking for the forms, but has not been able to find them.  She hopes to be able to do a better search this weekend.  Discussed timing in regards to next week and trying to get results back in time for July clinic.  She understands and will try to find them this weekend.

## 2016-06-07 ENCOUNTER — Encounter: Payer: Self-pay | Admitting: Genetic Counselor

## 2016-06-07 DIAGNOSIS — Z1379 Encounter for other screening for genetic and chromosomal anomalies: Secondary | ICD-10-CM | POA: Insufficient documentation

## 2016-06-08 ENCOUNTER — Telehealth: Payer: Self-pay | Admitting: Genetic Counselor

## 2016-06-08 NOTE — Telephone Encounter (Signed)
Revealed that genetic testing found a BRCA1 mutation.  The same one that was in her mother.  The remainder of genetic testing was negative.  Discussed having her come back in to discuss result.  I could see her at [facility] in September, or see her at [facility] in [place] in July or August.  She will get back to me about when would be a good time.  She is receiving bills from [facility] and had thought that providing tax information would stop that.  Discussed that tax information was for her genetic testing.  She should talk with [facility] financial counseling to discuss bills from [facility]. Patient voiced understanding.  SHe will let me know when she is available to come in and discuss further.

## 2016-06-15 ENCOUNTER — Telehealth: Payer: Self-pay | Admitting: Genetic Counselor

## 2016-06-15 ENCOUNTER — Encounter: Payer: Self-pay | Admitting: Genetic Counselor

## 2016-06-15 DIAGNOSIS — Z1501 Genetic susceptibility to malignant neoplasm of breast: Secondary | ICD-10-CM | POA: Insufficient documentation

## 2016-06-15 DIAGNOSIS — Z1509 Genetic susceptibility to other malignant neoplasm: Secondary | ICD-10-CM

## 2016-06-15 NOTE — Telephone Encounter (Signed)
LM on VM about setting up an appointment to discuss her test results.  Left CB information.

## 2016-06-21 ENCOUNTER — Telehealth: Payer: Self-pay | Admitting: Genetic Counselor

## 2016-06-21 NOTE — Telephone Encounter (Signed)
Patient asked to set up an appointment at [facility] in September.  I gave her 9/14 at 9 AM.  I will let Amy Nance know.

## 2016-06-30 ENCOUNTER — Encounter (HOSPITAL_COMMUNITY): Payer: Self-pay | Attending: Genetic Counselor | Admitting: Genetic Counselor

## 2016-06-30 ENCOUNTER — Other Ambulatory Visit (HOSPITAL_COMMUNITY): Payer: Self-pay

## 2016-06-30 ENCOUNTER — Encounter (HOSPITAL_COMMUNITY): Payer: Self-pay | Admitting: Genetic Counselor

## 2016-06-30 DIAGNOSIS — Z1379 Encounter for other screening for genetic and chromosomal anomalies: Secondary | ICD-10-CM

## 2016-06-30 DIAGNOSIS — Z803 Family history of malignant neoplasm of breast: Secondary | ICD-10-CM

## 2016-06-30 DIAGNOSIS — Z1509 Genetic susceptibility to other malignant neoplasm: Principal | ICD-10-CM

## 2016-06-30 DIAGNOSIS — Z1501 Genetic susceptibility to malignant neoplasm of breast: Secondary | ICD-10-CM

## 2016-06-30 DIAGNOSIS — Z8481 Family history of carrier of genetic disease: Secondary | ICD-10-CM

## 2016-06-30 NOTE — Progress Notes (Signed)
GENETIC TEST RESULTS   Patient Name: Monica Hancock Patient Age: 35 y.o. Encounter Date: 06/30/2016  Referring Provider: Ancil Linsey, MD    Monica Hancock was seen in the [facility] clinic on June 30, 2016 due to a family history of cancer, identification of the familial BRCA1 mutation, and concern regarding a hereditary predisposition to cancer in the family. Please refer to the prior Genetics clinic note for more information regarding Monica Hancock's medical and family histories and our assessment at the time.   FAMILY HISTORY:  We obtained a detailed, 4-generation family history.  Significant diagnoses are listed below: Family History  Problem Relation Age of Onset  . Diabetes Mother   . Breast cancer Mother 29  . Diabetes Maternal Grandmother   . Stomach cancer Maternal Grandfather   . Breast cancer Maternal Aunt     dx in her 35s  . Stomach cancer Maternal Aunt     The patient has five children, three boys and two girls, who are all cancer free. The patient has five maternal sisters who are healthy.  Her mother is alive and was recently diagnosed with a BRCA1 mutation.  Her mother has seven siblings.  One sister had breast cancer in her 52's and another sister had stomach cancer.  The patient's maternal grandfather was diagnosed with stomach cancer and died in his 1's.  Patient's maternal ancestors are of Hispanic descent, and paternal ancestors are of Hispanic descent. There is no reported Ashkenazi Jewish ancestry. There is no known consanguinity.  GENETIC TESTING:  At the time of Monica Hancock's visit, we recommended she pursue genetic testing for the specific BRCA1 mutation that was identified in the family. Testing, which was performed at [facility], revealed the familial mutation in the BRCA1 gene called Exons 9-12.  Additional testing through the [facility] test were also looked at and were negative.  MEDICAL MANAGEMENT: Women who have a BRCA mutation have an  increased risk for both breast and ovarian cancer.   As discussed with Monica Hancock, to reduce the risk for breast cancer, prophylactic bilateral mastectomy is the most effective option. However, for women who choose to keep their breasts, we recommend yearly mammograms, yearly breast MRI, twice-yearly clinical breast exams through a high-risk clinic, and monthly self-breast exams.   To reduce the risk for ovarian cancer, we recommend Monica Hancock  have a prophylactic bilateral salpingo-oophorectomy when childbearing is completed, if planned. We discussed that screening with CA-125 blood tests and transvaginal ultrasounds can be done twice per year. However, these tests have not been shown to detect ovarian cancer at an early stage.  RISK REDUCTION: There are several things that can be offered to individuals who are carriers for BRCA mutations that will reduce the risk for getting cancer.   The use of oral contraceptives can lower the risk for ovarian cancer, and, per case control studies, does not significantly increase the risk for breast cancer in BRCA patients. Case control studies have shown that oral contraceptives can lower the risk for ovarian cancer in women with BRCA mutations. Additionally, a more recent meta-analysis, including one cohort (n=3,181) and one case control study (1,096 cases and 2,878 controls) also showed an inverse correlation between ovarian cancer and ever having used oral contraceptives (OR, 0.58; 95% CI = 0.46-0.73). Studies on oral contraceptives and breast cancer have been conflicting, with some studies suggesting that there is not an increased risk for breast cancer in BRCA mutation carriers, while others suggest that there could be  a risk. That said, two meta-analysis studies have shown that there is not an increased risk for breast cancer with oral contraceptive use in BRCA1 and BRCA2 carriers.   In individuals who have a prophylactic bilateral salpingo-oophorectomy  (BSO), the risk for breast cancer is reduced by up to 50%. It has been reported that short term hormone replacement therapy in women undergoing prophylactic BSO does not negate the reduction of breast cancer risk associated with surgery (2.2016 NCCN guidelines).  FAMILY MEMBERS: It is important that all of Monica Hancock's relatives (both men and women) know of the presence of this gene mutation. Site-specific genetic testing can sort out who in the family is at risk and who is not.   Monica Hancock's children have a 50% chance to have inherited this mutation. However, four of her children are relatively young and this will not be of any consequence to them for several years. We do not test children because there is no risk to them until they are adults. We recommend they have genetic counseling and testing by the time they are in their early 20's.    Monica Hancock's eldest child and siblings have a 50% chance to have inherited this mutation. Four of her five sisters have undergone genetic testing already.  We recommend they have genetic testing for this same mutation, as identifying the presence of this mutation would allow them to also take advantage of risk-reducing measures.   SUPPORT AND RESOURCES: If Monica Hancock is interested in BRCA-specific information and support, Facing Our Risk (www.facingourrisk.com) is a group some people have found useful. They provide opportunities to speak with other individuals from high-risk families. To locate genetic counselors in other cities, visit the website of the [employer] (ArtistMovie.se) and Secretary/administrator for a Social worker by zip code.  We encouraged Monica Hancock to remain in contact with [place] on an annual basis so we can update her personal and family histories, and let her know of advances in cancer genetics that may benefit the family. Our contact number was provided. Monica Hancock questions were answered to her satisfaction today, and she knows  she is welcome to call anytime with additional questions.   [name], [place], [facility] Certified Genetic Counselor [email or web address] phone: [phone]

## 2016-07-11 ENCOUNTER — Encounter: Payer: Self-pay | Admitting: Genetic Counselor

## 2016-07-26 ENCOUNTER — Encounter: Payer: Self-pay | Admitting: *Deleted

## 2016-07-26 ENCOUNTER — Encounter (HOSPITAL_COMMUNITY): Payer: Self-pay | Admitting: Adult Health

## 2016-07-26 ENCOUNTER — Encounter (HOSPITAL_COMMUNITY): Payer: Self-pay | Attending: Genetic Counselor | Admitting: Adult Health

## 2016-07-26 VITALS — BP 123/83 | HR 76 | Temp 98.4°F | Resp 16 | Ht <= 58 in | Wt 154.6 lb

## 2016-07-26 DIAGNOSIS — Z1509 Genetic susceptibility to other malignant neoplasm: Principal | ICD-10-CM

## 2016-07-26 DIAGNOSIS — Z1502 Genetic susceptibility to malignant neoplasm of ovary: Secondary | ICD-10-CM

## 2016-07-26 DIAGNOSIS — Z1501 Genetic susceptibility to malignant neoplasm of breast: Secondary | ICD-10-CM

## 2016-07-26 NOTE — Progress Notes (Signed)
[facility] Clinical Social Work  Clinical Social Work was referred by Futures trader and Engineer, mining for assessment of psychosocial needs due to resources needed to assist BRCA1+ patient with no insurance. Clinical Social Worker met with patient at [facility] to offer support and assess for needs.  Pt reports she currently does not have insurance, but has had medicaid in the past when pregnant. Pt reports to be receiving some public assistance currently. She reports to receive food stamps and her children receive medicaid. She has attended the free clinic in [place] in the past, but felt they really did not listen to her concerns. Pt does have access to reliable transportation currently. CSW will review additional resources to assist pt and family with ongoing medical concerns. Pt is open to medical options in [place] and CSW plans to research options and phone pt later this week with possible options.    Clinical Social Work interventions: Resource assistance  Loren Racer, [place] Tuesdays   [phone]

## 2016-07-28 ENCOUNTER — Encounter: Payer: Self-pay | Admitting: *Deleted

## 2016-07-28 NOTE — Progress Notes (Signed)
[facility] Clinical Social Work  Clinical Social Work contacted patient at home to offer support and assess for needs due to needed follow up.  CSW educated pt on the need to revisit applying for medicaid again in order to have some sort of medical coverage in place. Pt agrees to revisit this option through DSS in [place] provided with several local family medicine options that see medicaid and uninsured patients. Pt most interested in [facility] as she can have all of her family seen there and there is a CSW on site. CSW also provided pt with [facility] number as well. She plans to contact both offices. Pt aware to contact CSW if she needs additional assistance with this process or other needs.     Clinical Social Work interventions: Resource assistance  Monica Hancock, [place] Tuesdays   [phone]

## 2016-07-29 NOTE — Progress Notes (Signed)
[place],  [id number]   CLINIC:  Medical Oncology  REASON FOR CONSULTATION:  BRCA1 (+) genetic mutation; High-risk for malignancy  REFERRAL FROM:  Roma Kayser, MS, [facility] Certified Genetic Counselor [phone] & Dr. Aviva Signs [phone]  PRIMARY CARE PROVIDER: Dr. Legrand Rams [phone]   BRIEF ONCOLOGY HISTORY:  -Genetic counseling done on 06/30/16 for familial BRCA1 mutation recently found in her mother and sister. -Genetic testing performed by [employer], revealed familial mutation in the BRCA1 gene called Exons 9-12. Additional testing through [facility] test were negative.    INTERVAL HISTORY:  Monica Hancock presents to the cancer center today for discussion regarding risk reduction strategies for her recently diagnosis of BRCA1 genetic mutation.  She is in good health and has no physical complaints today.  Her last pap smear was in 08/2011 and was negative.  She has never had a mammogram.  She has 5 children ranging in ages from 72 to 1.5 years.  She recently stopped breast feeding her youngest child about 1 month ago.    She does not have medical insurance.  She has been to the [place] free clinic in the past ([facility]), but does not go there regularly. She prefers not to go there unless she absolutely has to go.  She does not have an established OB/GYN or PCP.  Dr. Tania Ade and midwife Wells Guiles assisted in the delivery of her last child back in 02/2015.    She has done some of her own research regarding her genetic mutation and has decided that she would like to have TAH/BSO and is not interested in prophylactic bilateral mastectomies.  She prefers to have frequent monitoring of her breasts with imaging and breast exams as directed.    ADDITIONAL REVIEW OF SYSTEMS: Review of Systems  Constitutional: Negative.   HENT: Negative.   Eyes: Negative.   Respiratory: Negative.   Cardiovascular: Negative.     Gastrointestinal: Negative.   Genitourinary: Negative.   Musculoskeletal: Negative.   Skin: Negative.   Neurological: Negative.   Endo/Heme/Allergies: Negative.   Psychiatric/Behavioral: Negative.     PAST MEDICAL & SURGICAL HISTORY:  Past Medical History:  Diagnosis Date  . BRCA1 positive   . Family history of BRCA1 gene positive   . Family history of breast cancer   . H/O Bell's palsy   . HSV infection    Past Surgical History:  Procedure Laterality Date  . NO PAST SURGERIES    . TUBAL LIGATION       SOCIAL HISTORY:  Monica Hancock is married and lives with her husband and 4 of her 5 children in [place], [place]. They have been married for 11 years.  They are not interested in having any more children.  Her oldest son, age 79, lives with the patient's mother; he is not married and has no children.  Her younger children include 2 boys (ages 83 & 1.5) and 2 girls (ages 53 & 38).  Her mother is alive and was recently diagnosed with a BRCA1 mutation, prompting her daughters to get tested.  The patient has 5 maternal sisters who are healthy.  One of her sisters, who is with her today in the clinic, was also recently diagnosed with BRCA1 mutation.  They are of Hispanic descent.  Monica Hancock does not work outside of the home; her husband is employed but does not have Insurance underwriter through his employer.  Monica Hancock denies any current or history of  tobacco, alcohol, or illicit drug use.   CURRENT MEDICATIONS:  No current outpatient prescriptions on file prior to visit.   No current facility-administered medications on file prior to visit.      ALLERGIES: No Known Allergies   PERFORMANCE STATUS: 100   PHYSICAL EXAM:  Vitals:   07/26/16 1100  BP: 123/83  Pulse: 76  Resp: 16  Temp: 98.4 F (36.9 C)   Filed Weights   07/26/16 1100  Weight: 154 lb 9.6 oz (70.1 kg)    General: Female in no acute distress.  Accompanied by her sister.   HEENT: Head is normocephalic.   Pupils equal and reactive to light. Conjunctivae clear without exudate.  Sclerae anicteric. Oral mucosa is pink and moist without lesions. Oropharynx is pink and moist without lesions. Lymph: No cervical, supraclavicular, or infraclavicular lymphadenopathy noted on palpation.   Cardiovascular: Normal rate and rhythm Respiratory: Clear to auscultation bilaterally. Chest expansion symmetric without accessory muscle use. Breathing non-labored.    GU: Deferred.   GI: Soft, non-tender abdomen. Normoactive bowel sounds.  Neuro: No focal deficits. Steady gait.   Psych: Normal mood and affect for situation. Extremities: No edema, cyanosis, or clubbing.   Skin: Warm and dry.    LABORATORY DATA: None for this visit.   DIAGNOSTIC IMAGING:  None for this visit.    ASSESSMENT & PLAN:  Monica Hancock is a pleasant 35 y.o. female with recent diagnosis of BRCA1 genetic mutation.  She is here to discuss her options in managing her high-risk of malignancy based on BRCA1 mutation.   1. BRCA1 genetic mutation: Monica Hancock understands that the BRCA1 genetic mutation carries an increased risk of both breast and ovarian cancers in her lifetime.  According to the [facility], "55-65% of women who inherit a harmful BRCA1 mutation will develop breast cancer by age 7 years...39% of women will develop ovarian cancer by age 20 years." Also, women with BRCA1 mutations who are diagnosed with breast cancer are more likely to have triple negative disease (ER/PR/HER2 negative) disease, which often carries a poorer prognosis overall.  BRCA1 genetic mutation also increases risk of fallopian tube cancer and peritoneal cancers.  There are several risk reduction options for her to consider and we reviewed those today.  She could undergo total abdominal hysterectomy & bilateral salpingo-oopherectomy, which will reduce her risk of dying from ovarian cancer by about 80%. TAH/BSO also reduces her risk of dying from breast  cancer by about 56%.  If she elected to have prophylactic bilateral mastectomies, then her risk of breast cancer would again be reduced.  NCCN guidelines are as follows:   -Monica Hancock has done quite a bit of her own research and understands her risks.  She elects to proceed with TAH/BSO alone for surgical risk reduction and every 6 month clinical breast exams with annual mammogram & breast MRI.    2. Access to care: Monica Hancock does not have medical insurance, making her access to appropriate screening exams difficult.  I have consulted with our social worker, Loren Racer, LCSW to discuss potential options with the patient.  I have also reached out to [employer], who helps manage the [employer] program for patients at [facility] to see if there may be resources Monica Hancock can utilize to get her recommended mammogram and breast MRI annually.  This is still a work-in-progress.     Dispo:  -Return to clinic in 6 months for routine follow-up to see Dr. Whitney Muse.   -  This patient was seen & evaluated by Dr. Ancil Linsey who agrees with the above plan of care.   A total of 30 minutes was spent in face-to-face care of this patient, with greater than 50% of that time spent in counseling and care coordination.    Mike Craze, NP [place] [phone]  This is a shared visit with Mike Craze NP. Patient was seen, evaluated and examined by me as well. I agree with HPI as documented above. PE and assessment and plan are detailed below:  Vitals:   07/26/16 1100  BP: 123/83  Pulse: 76  Resp: 16  Temp: 98.4 F (36.9 C)    Physical Exam  Constitutional: She is oriented to person, place, and time and well-developed, well-nourished, and in no distress.  HENT:  Head: Normocephalic and atraumatic.  Nose: Nose normal.  Mouth/Throat: Oropharynx is clear and moist. No oropharyngeal exudate.  Eyes: Conjunctivae and EOM are normal. Pupils are equal, round, and  reactive to light. Right eye exhibits no discharge. Left eye exhibits no discharge. No scleral icterus.  Neck: Normal range of motion. Neck supple. No tracheal deviation present. No thyromegaly present.  Cardiovascular: Normal rate, regular rhythm and normal heart sounds.  Exam reveals no gallop and no friction rub.   No murmur heard. Pulmonary/Chest: Effort normal and breath sounds normal. She has no wheezes. She has no rales.  Abdominal: Soft. Bowel sounds are normal. She exhibits no distension and no mass. There is no tenderness. There is no rebound and no guarding.  Musculoskeletal: Normal range of motion. She exhibits no edema.  Lymphadenopathy:    She has no cervical adenopathy.  Neurological: She is alert and oriented to person, place, and time. She has normal reflexes. No cranial nerve deficit. Gait normal. Coordination normal.  Skin: Skin is warm and dry. No rash noted.  Psychiatric: Mood, memory, affect and judgment normal.  Nursing note and vitals reviewed.  ASSESSMENT & PLAN:  Monica Hancock is a pleasant 35 y.o. female with recent diagnosis of BRCA1 genetic mutation.  She is here to discuss her options in managing her high-risk of malignancy based on BRCA1 mutation.   NCCN guidelines for surveillance were reviewed with the patient in detail. She has a firm understanding of risk of breast and ovarian carcinoma. She understands options to reduce these risks. She also understands options for surveillance for early detection, specifically for breast carcinoma.   As noted the patient is uninsured We have reached out to local resources to see what help may be available to her.  She has opted for TAH/BSO which will reduce her ovarian/peritoneal carcinoma risk, it will also reduce her breast cancer risk. She understands that she will still need yearly mammogram, breast MRI, Q38monthbreast exams. Will see the patient back in 6 months. SDonald PoreMD

## 2016-08-05 ENCOUNTER — Emergency Department (HOSPITAL_COMMUNITY): Payer: Self-pay

## 2016-08-05 ENCOUNTER — Encounter (HOSPITAL_COMMUNITY): Payer: Self-pay | Admitting: Emergency Medicine

## 2016-08-05 ENCOUNTER — Emergency Department (HOSPITAL_COMMUNITY)
Admission: EM | Admit: 2016-08-05 | Discharge: 2016-08-05 | Disposition: A | Discharge status: Home / Self Care | Payer: Self-pay | Attending: Emergency Medicine | Admitting: Emergency Medicine

## 2016-08-05 DIAGNOSIS — H9209 Otalgia, unspecified ear: Secondary | ICD-10-CM | POA: Insufficient documentation

## 2016-08-05 DIAGNOSIS — J029 Acute pharyngitis, unspecified: Secondary | ICD-10-CM | POA: Insufficient documentation

## 2016-08-05 DIAGNOSIS — J020 Streptococcal pharyngitis: Secondary | ICD-10-CM

## 2016-08-05 LAB — RAPID STREP SCREEN (MED CTR MEBANE ONLY): STREPTOCOCCUS, GROUP A SCREEN (DIRECT): POSITIVE — AB

## 2016-08-05 MED ORDER — PENICILLIN G BENZATHINE 1200000 UNIT/2ML IM SUSP
1.2000 10*6.[IU] | Freq: Once | INTRAMUSCULAR | Status: AC
  Administered 2016-08-05: 1.2 10*6.[IU] via INTRAMUSCULAR
  Filled 2016-08-05: qty 2

## 2016-08-05 NOTE — ED Triage Notes (Signed)
Pt c./o sore throat, np cough and rigth ear pain x 4 days.

## 2016-08-05 NOTE — ED Provider Notes (Signed)
[facility] DEPT Provider Note   CSN: [id number] Arrival date & time: 08/05/16  0816     History   Chief Complaint Chief Complaint  Patient presents with  . Sore Throat    HPI Monica Hancock is a 35 y.o. female.  HPI  Pt was seen at 0835.  Per pt, c/o gradual onset and persistence of constant sore throat, right ear pain, and cough for the past 3 to 4 days. Has been associated with one home fever to "101."  Denies rash, no CP/SOB, no N/V/D, no abd pain.    Past Medical History:  Diagnosis Date  . BRCA1 positive   . Family history of BRCA1 gene positive   . Family history of breast cancer   . H/O Bell's palsy   . HSV infection     Patient Active Problem List   Diagnosis Date Noted  . BRCA1 positive 06/15/2016  . Genetic testing 06/07/2016  . Family history of breast cancer   . Family history of BRCA1 gene positive   . UTI (lower urinary tract infection) 04/21/2015    Past Surgical History:  Procedure Laterality Date  . NO PAST SURGERIES    . TUBAL LIGATION      OB History    Gravida Para Term Preterm AB Living   5 5 4 1  0 5   SAB TAB Ectopic Multiple Live Births   0 0 0 0 5       Home Medications    Prior to Admission medications   Not on File    Family History Family History  Problem Relation Age of Onset  . Diabetes Mother   . Breast cancer Mother 32  . Diabetes Maternal Grandmother   . Stomach cancer Maternal Grandfather   . Breast cancer Maternal Aunt     dx in her 60s  . Stomach cancer Maternal Aunt     Social History Social History  Substance Use Topics  . Smoking status: Never Smoker  . Smokeless tobacco: Never Used  . Alcohol use No     Allergies   Review of patient's allergies indicates no known allergies.   Review of Systems Review of Systems ROS: Statement: All systems negative except as marked or noted in the HPI; Constitutional: +fever and chills. ; ; Eyes: Negative for eye pain, redness and discharge. ; ; ENMT:  Negative for hoarseness, nasal congestion, sinus pressure and +ear pain, +sore throat. ; ; Cardiovascular: Negative for chest pain, palpitations, diaphoresis, dyspnea and peripheral edema. ; ; Respiratory: +cough. Negative for wheezing and stridor. ; ; Gastrointestinal: Negative for nausea, vomiting, diarrhea, abdominal pain, blood in stool, hematemesis, jaundice and rectal bleeding. . ; ; Genitourinary: Negative for dysuria, flank pain and hematuria. ; ; Musculoskeletal: Negative for back pain and neck pain. Negative for swelling and trauma.; ; Skin: Negative for pruritus, rash, abrasions, blisters, bruising and skin lesion.; ; Neuro: Negative for headache, lightheadedness and neck stiffness. Negative for weakness, altered level of consciousness, altered mental status, extremity weakness, paresthesias, involuntary movement, seizure and syncope.       Physical Exam Updated Vital Signs BP 121/95   Pulse 99   Temp 98.3 F (36.8 C)   Resp 18   Ht 4' 8"  (1.422 m)   Wt 154 lb (69.9 kg)   LMP 08/03/2016   SpO2 100%   BMI 34.53 kg/m   Physical Exam 0840: Physical examination:  Nursing notes reviewed; Vital signs and O2 SAT reviewed;  Constitutional: Well developed, Well  nourished, Well hydrated, In no acute distress; Head:  Normocephalic, atraumatic; Eyes: EOMI, PERRL, No scleral icterus; ENMT: TM's clear bilat. +edemetous nasal turbinates bilat with clear rhinorrhea. Mouth and pharynx without lesions. +mild posterior pharyngeal erythema. No tonsillar exudates. No intra-oral edema. No submandibular or sublingual edema. No hoarse voice, no drooling, no stridor. No pain with manipulation of larynx. No trismus. Mucous membranes moist; Neck: Supple, Full range of motion, No lymphadenopathy; Cardiovascular: Regular rate and rhythm, No murmur, rub, or gallop; Respiratory: Breath sounds clear & equal bilaterally, No rales, rhonchi, wheezes.  Speaking full sentences with ease, Normal respiratory  effort/excursion; Chest: Nontender, Movement normal; Abdomen: Soft, Nontender, Nondistended, Normal bowel sounds; Genitourinary: No CVA tenderness; Extremities: Pulses normal, No tenderness, No edema, No calf edema or asymmetry.; Neuro: AA&Ox3, Major CN grossly intact.  Speech clear. No gross focal motor or sensory deficits in extremities.; Skin: Color normal, Warm, Dry.   ED Treatments / Results  Labs (all labs ordered are listed, but only abnormal results are displayed)   EKG  EKG Interpretation None       Radiology   Procedures Procedures (including critical care time)  Medications Ordered in ED Medications - No data to display   Initial Impression / Assessment and Plan / ED Course  I have reviewed the triage vital signs and the nursing notes.  Pertinent labs & imaging results that were available during my care of the patient were reviewed by me and considered in my medical decision making (see chart for details).  MDM Reviewed: previous chart, nursing note and vitals Interpretation: labs and x-ray   Results for orders placed or performed during the hospital encounter of 08/05/16  Rapid strep screen  Result Value Ref Range   Streptococcus, Group A Screen (Direct) POSITIVE (A) NEGATIVE   Dg Chest 2 View Result Date: 08/05/2016 CLINICAL DATA:  35 year old with 3-4 day history of productive cough and sore throat. EXAM: CHEST  2 VIEW COMPARISON:  07/28/2013, 01/13/2010. FINDINGS: Cardiomediastinal silhouette unremarkable, unchanged. Lungs clear. Bronchovascular markings normal. Pulmonary vascularity normal. No visible pleural effusions. No pneumothorax. Visualized bony thorax intact. IMPRESSION: No acute cardiopulmonary disease.  Stable examination. Electronically Signed   By: Evangeline Dakin M.D.   On: 08/05/2016 09:01    0945:  Will dose IM PCN while in the ED to tx strep throat. Dx and testing d/w pt.  Questions answered.  Verb understanding, agreeable to d/c home with  outpt f/u.     Final Clinical Impressions(s) / ED Diagnoses   Final diagnoses:  None    New Prescriptions New Prescriptions   No medications on file     Francine Graven, DO 08/08/16 2157

## 2016-08-05 NOTE — Discharge Instructions (Signed)
You were fully treated for your strep throat with an injection of penicillin today while you were in the Emergency Department.  Call your regular medical doctor today to schedule a follow up appointment within the next week.  Return to the Emergency Department immediately sooner if worsening.

## 2016-08-11 ENCOUNTER — Other Ambulatory Visit (HOSPITAL_COMMUNITY): Payer: Self-pay

## 2016-08-11 ENCOUNTER — Encounter (HOSPITAL_COMMUNITY): Payer: Self-pay | Admitting: Genetic Counselor

## 2016-09-29 ENCOUNTER — Encounter: Payer: Self-pay | Admitting: *Deleted

## 2016-09-29 NOTE — Progress Notes (Signed)
[name]  Clinical Social Work was referred by patient for assessment of psychosocial needs.  Clinical Social Worker was contacted by patient for update about her plan to attempt to find provider to complete her need for hysterectomy. Pt reports she met with financial counselor, [name] at [facility] and suggested she reach out to [facility] where she had received her prenatal care in the past. Pt reports she plans to make an appt to proceed in near future. Pt reports she also is on the waiting list to see provider at [facility] and continues to use [facility] as needed. CSW provided pt with praise and encouragement for her follow through and efforts. Pt plans to reach out with additional updates as needed. Pt denies concerns currently and wanted to say thank you for previous support. CSW available as needed.   Clinical Social Work interventions:  Supportive Psychiatric nurse education and referral  Monica Hancock, [name]  [name] Phone: [phone] Fax: [phone]

## 2016-11-11 ENCOUNTER — Emergency Department (HOSPITAL_COMMUNITY)
Admission: EM | Admit: 2016-11-11 | Discharge: 2016-11-11 | Disposition: A | Discharge status: Home / Self Care | Payer: Self-pay | Attending: Emergency Medicine | Admitting: Emergency Medicine

## 2016-11-11 ENCOUNTER — Encounter (HOSPITAL_COMMUNITY): Payer: Self-pay | Admitting: Emergency Medicine

## 2016-11-11 DIAGNOSIS — J020 Streptococcal pharyngitis: Secondary | ICD-10-CM | POA: Insufficient documentation

## 2016-11-11 LAB — RAPID STREP SCREEN (MED CTR MEBANE ONLY): STREPTOCOCCUS, GROUP A SCREEN (DIRECT): POSITIVE — AB

## 2016-11-11 MED ORDER — MAGIC MOUTHWASH W/LIDOCAINE: 5.0000 mL | Freq: Three times a day (TID) | ORAL | 0 refills | Status: DC | PRN

## 2016-11-11 MED ORDER — PENICILLIN G BENZATHINE 1200000 UNIT/2ML IM SUSP
1.2000 10*6.[IU] | Freq: Once | INTRAMUSCULAR | Status: AC
  Administered 2016-11-11: 1.2 10*6.[IU] via INTRAMUSCULAR
  Filled 2016-11-11: qty 2

## 2016-11-11 NOTE — Discharge Instructions (Signed)
Alternate tylenol and ibuprofen every 4-6 hrs for body aches and fever.  Drink plenty of fluids.  Follow-up with your doctor if needed.

## 2016-11-11 NOTE — ED Triage Notes (Signed)
Pt reports sore throat since Wednesday, denies n/v/d.  Pt also having fever and chills, alert and oriented.

## 2016-11-13 NOTE — ED Provider Notes (Signed)
[facility] DEPT Provider Note   CSN: [id number] Arrival date & time: 11/11/16  1643     History   Chief Complaint Chief Complaint  Patient presents with  . Sore Throat    HPI Monica Hancock is a 35 y.o. female.  HPI   Monica Hancock is a 35 y.o. female who presents to the Emergency Department complaining of sore throat for two days.  She reports having chills, fever and pain with swallowing.  She states the she had similar symptoms previously and was diagnosed with strep throat.  She denies vomiting, rash, abdominal pain or cough. She has taken tylenol without relief.     Past Medical History:  Diagnosis Date  . BRCA1 positive   . Family history of BRCA1 gene positive   . Family history of breast cancer   . H/O Bell's palsy   . HSV infection     Patient Active Problem List   Diagnosis Date Noted  . BRCA1 positive 06/15/2016  . Genetic testing 06/07/2016  . Family history of breast cancer   . Family history of BRCA1 gene positive   . UTI (lower urinary tract infection) 04/21/2015    Past Surgical History:  Procedure Laterality Date  . NO PAST SURGERIES    . TUBAL LIGATION      OB History    Gravida Para Term Preterm AB Living   5 5 4 1  0 5   SAB TAB Ectopic Multiple Live Births   0 0 0 0 5       Home Medications    Prior to Admission medications   Medication Sig Start Date End Date Taking? Authorizing Provider  magic mouthwash w/lidocaine SOLN Take 5 mLs by mouth 3 (three) times daily as needed for mouth pain. Swish and spit, do not swallow 11/11/16   Mahrosh Donnell, PA-C    Family History Family History  Problem Relation Age of Onset  . Diabetes Mother   . Breast cancer Mother 30  . Diabetes Maternal Grandmother   . Stomach cancer Maternal Grandfather   . Breast cancer Maternal Aunt     dx in her 54s  . Stomach cancer Maternal Aunt     Social History Social History  Substance Use Topics  . Smoking status: Never Smoker  .  Smokeless tobacco: Never Used  . Alcohol use No     Allergies   Patient has no known allergies.   Review of Systems Review of Systems  Constitutional: Positive for chills and fever. Negative for activity change and appetite change.  HENT: Positive for congestion and sore throat. Negative for ear pain, facial swelling, trouble swallowing and voice change.   Eyes: Negative for pain and visual disturbance.  Respiratory: Negative for cough and shortness of breath.   Gastrointestinal: Negative for abdominal pain, nausea and vomiting.  Genitourinary: Negative for dysuria.  Musculoskeletal: Negative for arthralgias, neck pain and neck stiffness.  Skin: Negative for color change and rash.  Neurological: Negative for dizziness, facial asymmetry, speech difficulty, numbness and headaches.  Hematological: Negative for adenopathy.  All other systems reviewed and are negative.    Physical Exam Updated Vital Signs BP 119/80 (BP Location: Left Arm)   Pulse 99   Temp 98.7 F (37.1 C) (Oral)   Resp 16   Ht 4' 8"  (1.422 m)   Wt 68.5 kg   LMP 10/21/2016 (Exact Date)   SpO2 100%   BMI 33.85 kg/m   Physical Exam  Constitutional: She is oriented  to person, place, and time. She appears well-developed and well-nourished. No distress.  HENT:  Head: Normocephalic and atraumatic.  Right Ear: Tympanic membrane and ear canal normal.  Left Ear: Tympanic membrane and ear canal normal.  Mouth/Throat: Uvula is midline and mucous membranes are normal. No trismus in the jaw. No uvula swelling. Posterior oropharyngeal edema and posterior oropharyngeal erythema present. No oropharyngeal exudate or tonsillar abscesses. Tonsils are 2+ on the right. Tonsils are 2+ on the left.  Uvula is non-edematous and midline  Neck: Normal range of motion. Neck supple.  Cardiovascular: Normal rate, regular rhythm and normal heart sounds.   Pulmonary/Chest: Effort normal and breath sounds normal.  Abdominal: There is no  splenomegaly. There is no tenderness.  Musculoskeletal: Normal range of motion.  Lymphadenopathy:    She has no cervical adenopathy.  Neurological: She is alert and oriented to person, place, and time. She exhibits normal muscle tone. Coordination normal.  Skin: Skin is warm and dry.  Nursing note and vitals reviewed.    ED Treatments / Results  Labs (all labs ordered are listed, but only abnormal results are displayed) Labs Reviewed  RAPID STREP SCREEN (NOT AT [facility]) - Abnormal; Notable for the following:       Result Value   Streptococcus, Group A Screen (Direct) POSITIVE (*)    All other components within normal limits    EKG  EKG Interpretation None       Radiology No results found.  Procedures Procedures (including critical care time)  Medications Ordered in ED Medications  penicillin g benzathine (BICILLIN LA) 1200000 UNIT/2ML injection 1.2 Million Units (1.2 Million Units Intramuscular Given 11/11/16 1802)     Initial Impression / Assessment and Plan / ED Course  I have reviewed the triage vital signs and the nursing notes.  Pertinent labs & imaging results that were available during my care of the patient were reviewed by me and considered in my medical decision making (see chart for details).  Clinical Course    Pt is well appearing.  Airway patent.  Strep screen positive.  tx with Bicillin.  Pt agrees to continue tylenol or ibuprofen.  Fluids.  Return precautions given.  Final Clinical Impressions(s) / ED Diagnoses   Final diagnoses:  Strep pharyngitis    New Prescriptions Discharge Medication List as of 11/11/2016  6:29 PM    START taking these medications   Details  magic mouthwash w/lidocaine SOLN Take 5 mLs by mouth 3 (three) times daily as needed for mouth pain. Swish and spit, do not swallow, Starting Fri 11/11/2016, Print         Shundra Wirsing [place], [place] 11/13/16 [place], MD 11/15/16 1254

## 2016-11-16 ENCOUNTER — Encounter: Payer: Self-pay | Admitting: Physician Assistant

## 2016-11-16 ENCOUNTER — Ambulatory Visit: Payer: Self-pay | Admitting: Physician Assistant

## 2016-11-16 VITALS — BP 100/68 | HR 75 | Temp 97.9°F | Ht 58.5 in | Wt 155.2 lb

## 2016-11-16 DIAGNOSIS — Z13220 Encounter for screening for lipoid disorders: Secondary | ICD-10-CM

## 2016-11-16 DIAGNOSIS — Z6831 Body mass index (BMI) 31.0-31.9, adult: Secondary | ICD-10-CM

## 2016-11-16 DIAGNOSIS — Z1239 Encounter for other screening for malignant neoplasm of breast: Secondary | ICD-10-CM

## 2016-11-16 DIAGNOSIS — E669 Obesity, unspecified: Secondary | ICD-10-CM

## 2016-11-16 DIAGNOSIS — Z1501 Genetic susceptibility to malignant neoplasm of breast: Secondary | ICD-10-CM

## 2016-11-16 DIAGNOSIS — Z1509 Genetic susceptibility to other malignant neoplasm: Principal | ICD-10-CM

## 2016-11-16 DIAGNOSIS — Z803 Family history of malignant neoplasm of breast: Secondary | ICD-10-CM

## 2016-11-16 LAB — LIPID PANEL
CHOL/HDL RATIO: 4.1 ratio (ref <5.0)
CHOLESTEROL: 169 mg/dL (ref <200)
HDL: 41 mg/dL — AB (ref >50)
LDL Cholesterol: 111 mg/dL — ABNORMAL HIGH (ref <100)
Triglycerides: 86 mg/dL (ref <150)
VLDL: 17 mg/dL (ref <30)

## 2016-11-16 LAB — COMPREHENSIVE METABOLIC PANEL
ALT: 14 U/L (ref 6–29)
AST: 12 U/L (ref 10–30)
Albumin: 3.9 g/dL (ref 3.6–5.1)
Alkaline Phosphatase: 62 U/L (ref 33–115)
BUN: 10 mg/dL (ref 7–25)
CHLORIDE: 106 mmol/L (ref 98–110)
CO2: 25 mmol/L (ref 20–31)
Calcium: 9.4 mg/dL (ref 8.6–10.2)
Creat: 0.62 mg/dL (ref 0.50–1.10)
GLUCOSE: 97 mg/dL (ref 65–99)
POTASSIUM: 4.2 mmol/L (ref 3.5–5.3)
Sodium: 138 mmol/L (ref 135–146)
Total Bilirubin: 0.3 mg/dL (ref 0.2–1.2)
Total Protein: 7 g/dL (ref 6.1–8.1)

## 2016-11-16 NOTE — Progress Notes (Signed)
BP 100/68 (BP Location: Left Arm, Patient Position: Sitting, Cuff Size: Normal)   Pulse 75   Temp 97.9 F (36.6 C)   Ht 4' 10.5" (1.486 m)   Wt 155 lb 4 oz (70.4 kg)   LMP 10/21/2016 (Exact Date)   SpO2 99%   BMI 31.90 kg/m    Subjective:    Patient ID: Monica Hancock, female    DOB: Jul 13, 1981, 35 y.o.   MRN: [id number]  HPI: Monica Hancock is a 35 y.o. female presenting on 11/16/2016 for New Patient (Initial Visit) (previous patient. pt went to [facility] once before they closed and has not been anywhere since)   HPI   Chief Complaint  Patient presents with  . New Patient (Initial Visit)    previous patient. pt went to [facility] once before they closed and has not been anywhere since     Pt at [facility] in the past.  Last seeen here 03/03/14  She says her last PAP was 02/2014 at [facility] concerned- mother dx with breast CA at age 30.   Pt saw dr Whitney Muse for  + BRCA in September and has f/u in March.  Discussed mammogram and pt says she had been breastfeeding recently and so isn't supposed to get a mammogram until march.   Reviewed dr Donald Pore note which makes no mention of delaying mammogram.  Pt stopped breastfeeding in Feb 2017.    Relevant past medical, surgical, family and social history reviewed and updated as indicated. Interim medical history since our last visit reviewed. Allergies and medications reviewed and updated.  No current outpatient prescriptions on file.   Review of Systems  Constitutional: Negative for appetite change, chills, diaphoresis, fatigue, fever and unexpected weight change.  HENT: Negative for congestion, dental problem, drooling, ear pain, facial swelling, hearing loss, mouth sores, sneezing, sore throat, trouble swallowing and voice change.   Eyes: Negative for pain, discharge, redness, itching and visual disturbance.  Respiratory: Positive for cough. Negative for choking, shortness of breath and wheezing.   Cardiovascular:  Negative for chest pain, palpitations and leg swelling.  Gastrointestinal: Negative for abdominal pain, blood in stool, constipation, diarrhea and vomiting.  Endocrine: Negative for cold intolerance, heat intolerance and polydipsia.  Genitourinary: Negative for decreased urine volume, dysuria and hematuria.  Musculoskeletal: Negative for arthralgias, back pain and gait problem.  Skin: Negative for rash.  Allergic/Immunologic: Negative for environmental allergies.  Neurological: Negative for seizures, syncope, light-headedness and headaches.  Hematological: Negative for adenopathy.  Psychiatric/Behavioral: Negative for agitation, dysphoric mood and suicidal ideas. The patient is not nervous/anxious.     Per HPI unless specifically indicated above     Objective:    BP 100/68 (BP Location: Left Arm, Patient Position: Sitting, Cuff Size: Normal)   Pulse 75   Temp 97.9 F (36.6 C)   Ht 4' 10.5" (1.486 m)   Wt 155 lb 4 oz (70.4 kg)   LMP 10/21/2016 (Exact Date)   SpO2 99%   BMI 31.90 kg/m   Wt Readings from Last 3 Encounters:  11/16/16 155 lb 4 oz (70.4 kg)  11/11/16 151 lb (68.5 kg)  08/05/16 154 lb (69.9 kg)    Physical Exam  Constitutional: She is oriented to person, place, and time. She appears well-developed and well-nourished.  HENT:  Head: Normocephalic and atraumatic.  Mouth/Throat: Oropharynx is clear and moist. No oropharyngeal exudate.  Eyes: Conjunctivae and EOM are normal. Pupils are equal, round, and reactive to light.  Neck:  Neck supple. No thyromegaly present.  Cardiovascular: Normal rate and regular rhythm.   Pulmonary/Chest: Effort normal and breath sounds normal.  Abdominal: Soft. Bowel sounds are normal. She exhibits no mass. There is no hepatosplenomegaly. There is no tenderness.  Genitourinary: No breast swelling, tenderness, discharge or bleeding.  Genitourinary Comments: Breast exam without abnormal findings  Musculoskeletal: She exhibits no edema.   Lymphadenopathy:    She has no cervical adenopathy.  Neurological: She is alert and oriented to person, place, and time. Gait normal.  Skin: Skin is warm and dry.  Psychiatric: She has a normal mood and affect. Her behavior is normal.  Vitals reviewed.       Assessment & Plan:    Encounter Diagnoses  Name Primary?  Monica Hancock BRCA1 positive Yes  . Family history of breast cancer   . Screening for breast cancer   . Screening for hyperlipidemia   . Class 1 obesity with body mass index (BMI) of 31.0 to 31.9 in adult, unspecified obesity type, unspecified whether serious comorbidity present       -Order screening mammogram -Will refer to [facility] to discuss possible TAH-SBO due to + BRCA -pt states she wants TAH-BSO but does not want mastectomy -Pt says she has [facility] - 100% through May 2017 -Check fasting lipids- mildly elevated in the past -F/u 1 month.  RTO sooner prn

## 2016-12-12 ENCOUNTER — Ambulatory Visit (HOSPITAL_COMMUNITY): Payer: Self-pay

## 2016-12-19 ENCOUNTER — Ambulatory Visit: Payer: Self-pay | Admitting: Physician Assistant

## 2016-12-19 ENCOUNTER — Ambulatory Visit (HOSPITAL_COMMUNITY)
Admission: RE | Admit: 2016-12-19 | Discharge: 2016-12-19 | Disposition: A | Discharge status: Home / Self Care | Payer: Self-pay | Source: Ambulatory Visit | Attending: Physician Assistant | Admitting: Physician Assistant

## 2016-12-19 ENCOUNTER — Encounter: Payer: Self-pay | Admitting: Physician Assistant

## 2016-12-19 ENCOUNTER — Other Ambulatory Visit: Payer: Self-pay | Admitting: Physician Assistant

## 2016-12-19 ENCOUNTER — Ambulatory Visit (HOSPITAL_COMMUNITY): Admission: RE | Admit: 2016-12-19 | Payer: Self-pay | Source: Ambulatory Visit

## 2016-12-19 ENCOUNTER — Encounter (HOSPITAL_COMMUNITY): Payer: Self-pay

## 2016-12-19 VITALS — BP 110/76 | HR 69 | Temp 97.9°F | Ht 58.5 in | Wt 157.2 lb

## 2016-12-19 DIAGNOSIS — Z1501 Genetic susceptibility to malignant neoplasm of breast: Secondary | ICD-10-CM

## 2016-12-19 DIAGNOSIS — Z803 Family history of malignant neoplasm of breast: Secondary | ICD-10-CM

## 2016-12-19 DIAGNOSIS — E785 Hyperlipidemia, unspecified: Secondary | ICD-10-CM

## 2016-12-19 DIAGNOSIS — Z1509 Genetic susceptibility to other malignant neoplasm: Principal | ICD-10-CM

## 2016-12-19 DIAGNOSIS — Z1231 Encounter for screening mammogram for malignant neoplasm of breast: Secondary | ICD-10-CM

## 2016-12-19 DIAGNOSIS — Z15060 Genetic susceptibility to colorectal cancer: Secondary | ICD-10-CM

## 2016-12-19 NOTE — Patient Instructions (Signed)
Fat and Cholesterol Restricted Diet High levels of fat and cholesterol in your blood may lead to various health problems, such as diseases of the heart, blood vessels, gallbladder, liver, and pancreas. Fats are concentrated sources of energy that come in various forms. Certain types of fat, including saturated fat, may be harmful in excess. Cholesterol is a substance needed by your body in small amounts. Your body makes all the cholesterol it needs. Excess cholesterol comes from the food you eat. When you have high levels of cholesterol and saturated fat in your blood, health problems can develop because the excess fat and cholesterol will gather along the walls of your blood vessels, causing them to narrow. Choosing the right foods will help you control your intake of fat and cholesterol. This will help keep the levels of these substances in your blood within normal limits and reduce your risk of disease. What is my plan? Your health care provider recommends that you:  Limit your fat intake to ______% or less of your total calories per day.  Limit the amount of cholesterol in your diet to less than _________mg per day.  Eat 20-30 grams of fiber each day.  What types of fat should I choose?  Choose healthy fats more often. Choose monounsaturated and polyunsaturated fats, such as olive and canola oil, flaxseeds, walnuts, almonds, and seeds.  Eat more omega-3 fats. Good choices include salmon, mackerel, sardines, tuna, flaxseed oil, and ground flaxseeds. Aim to eat fish at least two times a week.  Limit saturated fats. Saturated fats are primarily found in animal products, such as meats, butter, and cream. Plant sources of saturated fats include palm oil, palm kernel oil, and coconut oil.  Avoid foods with partially hydrogenated oils in them. These contain trans fats. Examples of foods that contain trans fats are stick margarine, some tub margarines, cookies, crackers, and other baked goods. What  general guidelines do I need to follow? These guidelines for healthy eating will help you control your intake of fat and cholesterol:  Check food labels carefully to identify foods with trans fats or high amounts of saturated fat.  Fill one half of your plate with vegetables and green salads.  Fill one fourth of your plate with whole grains. Look for the word "whole" as the first word in the ingredient list.  Fill one fourth of your plate with lean protein foods.  Limit fruit to two servings a day. Choose fruit instead of juice.  Eat more foods that contain fiber, such as apples, broccoli, carrots, beans, peas, and barley.  Eat more home-cooked food and less restaurant, buffet, and fast food.  Limit or avoid alcohol.  Limit foods high in starch and sugar.  Limit fried foods.  Cook foods using methods other than frying. Baking, boiling, grilling, and broiling are all great options.  Lose weight if you are overweight. Losing just 5-10% of your initial body weight can help your overall health and prevent diseases such as diabetes and heart disease.  What foods can I eat? Grains  Whole grains, such as whole wheat or whole grain breads, crackers, cereals, and pasta. Unsweetened oatmeal, bulgur, barley, quinoa, or brown rice. Corn or whole wheat flour tortillas. Vegetables  Fresh or frozen vegetables (raw, steamed, roasted, or grilled). Green salads. Fruits  All fresh, canned (in natural juice), or frozen fruits. Meats and other protein foods  Ground beef (85% or leaner), grass-fed beef, or beef trimmed of fat. Skinless chicken or turkey. Ground chicken or turkey.   Pork trimmed of fat. All fish and seafood. Eggs. Dried beans, peas, or lentils. Unsalted nuts or seeds. Unsalted canned or dry beans. Dairy  Low-fat dairy products, such as skim or 1% milk, 2% or reduced-fat cheeses, low-fat ricotta or cottage cheese, or plain low-fat yo Fats and oils  Tub margarines without trans  fats. Light or reduced-fat mayonnaise and salad dressings. Avocado. Olive, canola, sesame, or safflower oils. Natural peanut or almond butter (choose ones without added sugar and oil). The items listed above may not be a complete list of recommended foods or beverages. Contact your dietitian for more options. Foods to avoid Grains  White bread. White pasta. White rice. Cornbread. Bagels, pastries, and croissants. Crackers that contain trans fat. Vegetables  White potatoes. Corn. Creamed or fried vegetables. Vegetables in a cheese sauce. Fruits  Dried fruits. Canned fruit in light or heavy syrup. Fruit juice. Meats and other protein foods  Fatty cuts of meat. Ribs, chicken wings, bacon, sausage, bologna, salami, chitterlings, fatback, hot dogs, bratwurst, and packaged luncheon meats. Liver and organ meats. Dairy  Whole or 2% milk, cream, half-and-half, and cream cheese. Whole milk cheeses. Whole-fat or sweetened yogurt. Full-fat cheeses. Nondairy creamers and whipped toppings. Processed cheese, cheese spreads, or cheese curds. Beverages  Alcohol. Sweetened drinks (such as sodas, lemonade, and fruit drinks or punches). Fats and oils  Butter, stick margarine, lard, shortening, ghee, or bacon fat. Coconut, palm kernel, or palm oils. Sweets and desserts  Corn syrup, sugars, honey, and molasses. Candy. Jam and jelly. Syrup. Sweetened cereals. Cookies, pies, cakes, donuts, muffins, and ice cream. The items listed above may not be a complete list of foods and beverages to avoid. Contact your dietitian for more information. This information is not intended to replace advice given to you by your health care provider. Make sure you discuss any questions you have with your health care provider. Document Released: 11/07/2005 Document Revised: 11/28/2014 Document Reviewed: 02/05/2014 Elsevier Interactive Patient Education  2017 Elsevier Inc.  

## 2016-12-19 NOTE — Progress Notes (Signed)
BP 110/76 (BP Location: Left Arm, Patient Position: Sitting, Cuff Size: Normal)   Pulse 69   Temp 97.9 F (36.6 C)   Ht 4' 10.5" (1.486 m)   Wt 157 lb 4 oz (71.3 kg)   LMP 12/19/2016   SpO2 99%   BMI 32.31 kg/m    Subjective:    Patient ID: Monica Hancock, female    DOB: 1981/01/15, 36 y.o.   MRN: [id number]  HPI: Monica Hancock is a 36 y.o. female presenting on 12/19/2016 for Follow-up   HPI   Pt is feeling well today  Relevant past medical, surgical, family and social history reviewed and updated as indicated. Interim medical history since our last visit reviewed. Allergies and medications reviewed and updated.  No current outpatient prescriptions on file.   Review of Systems  Constitutional: Negative for appetite change, chills, diaphoresis, fatigue, fever and unexpected weight change.  HENT: Negative for congestion, dental problem, drooling, ear pain, facial swelling, hearing loss, mouth sores, sneezing, sore throat, trouble swallowing and voice change.   Eyes: Negative for pain, discharge, redness, itching and visual disturbance.  Respiratory: Negative for cough, choking, shortness of breath and wheezing.   Cardiovascular: Negative for chest pain, palpitations and leg swelling.  Gastrointestinal: Negative for abdominal pain, blood in stool, constipation, diarrhea and vomiting.  Endocrine: Negative for cold intolerance, heat intolerance and polydipsia.  Genitourinary: Negative for decreased urine volume, dysuria and hematuria.  Musculoskeletal: Negative for arthralgias, back pain and gait problem.  Skin: Negative for rash.  Allergic/Immunologic: Negative for environmental allergies.  Neurological: Negative for seizures, syncope, light-headedness and headaches.  Hematological: Negative for adenopathy.  Psychiatric/Behavioral: Negative for agitation, dysphoric mood and suicidal ideas. The patient is not nervous/anxious.     Per HPI unless specifically  indicated above     Objective:    BP 110/76 (BP Location: Left Arm, Patient Position: Sitting, Cuff Size: Normal)   Pulse 69   Temp 97.9 F (36.6 C)   Ht 4' 10.5" (1.486 m)   Wt 157 lb 4 oz (71.3 kg)   LMP 12/19/2016   SpO2 99%   BMI 32.31 kg/m   Wt Readings from Last 3 Encounters:  12/19/16 157 lb 4 oz (71.3 kg)  11/16/16 155 lb 4 oz (70.4 kg)  11/11/16 151 lb (68.5 kg)    Physical Exam  Constitutional: She is oriented to person, place, and time. She appears well-developed and well-nourished.  HENT:  Head: Normocephalic and atraumatic.  Mouth/Throat: Oropharynx is clear and moist. No oropharyngeal exudate.  Eyes: Conjunctivae and EOM are normal. Pupils are equal, round, and reactive to light.  Neck: Neck supple. No thyromegaly present.  Cardiovascular: Normal rate and regular rhythm.   Pulmonary/Chest: Effort normal and breath sounds normal.  Abdominal: Soft. Bowel sounds are normal. She exhibits no mass. There is no hepatosplenomegaly. There is no tenderness.  Musculoskeletal: She exhibits no edema.  Lymphadenopathy:    She has no cervical adenopathy.  Neurological: She is alert and oriented to person, place, and time. Gait normal.  Skin: Skin is warm and dry.  Psychiatric: She has a normal mood and affect. Her behavior is normal.  Vitals reviewed.   Results for orders placed or performed in visit on 11/16/16  Lipid Profile  Result Value Ref Range   Cholesterol 169 <200 mg/dL   Triglycerides 86 <150 mg/dL   HDL 41 (L) >50 mg/dL   Total CHOL/HDL Ratio 4.1 <5.0 Ratio   VLDL 17 <30 mg/dL  LDL Cholesterol 111 (H) <100 mg/dL  Comprehensive Metabolic Panel (CMET)  Result Value Ref Range   Sodium 138 135 - 146 mmol/L   Potassium 4.2 3.5 - 5.3 mmol/L   Chloride 106 98 - 110 mmol/L   CO2 25 20 - 31 mmol/L   Glucose, Bld 97 65 - 99 mg/dL   BUN 10 7 - 25 mg/dL   Creat 0.62 0.50 - 1.10 mg/dL   Total Bilirubin 0.3 0.2 - 1.2 mg/dL   Alkaline Phosphatase 62 33 - 115 U/L    AST 12 10 - 30 U/L   ALT 14 6 - 29 U/L   Total Protein 7.0 6.1 - 8.1 g/dL   Albumin 3.9 3.6 - 5.1 g/dL   Calcium 9.4 8.6 - 10.2 mg/dL      Assessment & Plan:   Encounter Diagnosis  Name Primary?  Marland Kitchen BRCA1 positive Yes    -reviewed labs with pt -Gyn supposed to call pt about appointment (per nurse) -PAP request sent to [facility] clinic -lowfat diet and regular exercise for lipids -F/u 4 months.  RTO sooner prn

## 2016-12-20 ENCOUNTER — Other Ambulatory Visit: Payer: Self-pay | Admitting: Physician Assistant

## 2016-12-20 DIAGNOSIS — R928 Other abnormal and inconclusive findings on diagnostic imaging of breast: Secondary | ICD-10-CM

## 2016-12-30 ENCOUNTER — Encounter: Payer: Self-pay | Admitting: Obstetrics & Gynecology

## 2016-12-30 ENCOUNTER — Other Ambulatory Visit (HOSPITAL_COMMUNITY): Payer: Self-pay | Admitting: *Deleted

## 2016-12-30 ENCOUNTER — Ambulatory Visit (INDEPENDENT_AMBULATORY_CARE_PROVIDER_SITE_OTHER): Payer: Self-pay | Admitting: Obstetrics & Gynecology

## 2016-12-30 VITALS — BP 110/80 | HR 82 | Wt 155.0 lb

## 2016-12-30 DIAGNOSIS — R928 Other abnormal and inconclusive findings on diagnostic imaging of breast: Secondary | ICD-10-CM

## 2016-12-30 DIAGNOSIS — Z1509 Genetic susceptibility to other malignant neoplasm: Principal | ICD-10-CM

## 2016-12-30 DIAGNOSIS — Z1501 Genetic susceptibility to malignant neoplasm of breast: Secondary | ICD-10-CM

## 2016-12-30 DIAGNOSIS — Z1502 Genetic susceptibility to malignant neoplasm of ovary: Secondary | ICD-10-CM

## 2016-12-30 DIAGNOSIS — Z7189 Other specified counseling: Secondary | ICD-10-CM

## 2017-01-01 NOTE — Progress Notes (Signed)
      Chief Complaint  Patient presents with  . discuss surgery    hysterectomy- risk breast/ ovarian cancer/ room 11    Blood pressure 110/80, pulse 82, weight 155 lb (70.3 kg), last menstrual period 12/19/2016, currently breastfeeding.  36 y.o. [id number] Patient's last menstrual period was 12/19/2016. The current method of family planning is tubal ligation.  No outpatient encounter prescriptions on file as of 12/30/2016.   No facility-administered encounter medications on file as of 12/30/2016.     Subjective Pt is BRCA 1 positive, mother had breast cancer at age 68 Discussed at length management from a gyn standpointup to pt ultimately but would be post menopausal right away with no possibility of HRT So reasonable to wait to 38 if pt desires  Objective   Pertinent ROS   Labs or studies     Impression Diagnoses this Encounter::   ICD-9-CM ICD-10-CM   1. BRCA1 gene mutation positive V84.01 Z15.01     Z15.02     Established relevant diagnosis(es):   Plan/Recommendations: No orders of the defined types were placed in this encounter.   Labs or Scans Ordered: No orders of the defined types were placed in this encounter.   Management:: Pt will consider options and then at some pint proceed with hysterectomy with BSO no possibility of ERT  Follow up Return if symptoms worsen or fail to improve.        Face to face time:  15 minutes  Greater than 50% of the visit time was spent in counseling and coordination of care with the patient.  The summary and outline of the counseling and care coordination is summarized in the note above.   All questions were answered.  Past Medical History:  Diagnosis Date  . BRCA1 positive   . Family history of BRCA1 gene positive   . Family history of breast cancer   . H/O Bell's palsy 2000   Left sided  . HSV infection     Past Surgical History:  Procedure Laterality Date  . TUBAL LIGATION      OB History    Gravida Para Term Preterm AB Living   _0 0 5   SAB TAB Ectopic Multiple Live Births   0 0 0 0 5      No Known Allergies  Social History   Social History  . Marital status: Married    Spouse name: [name]  . Number of children: 5  . Years of education: N/A   Social History Main Topics  . Smoking status: Never Smoker  . Smokeless tobacco: Never Used  . Alcohol use No  . Drug use: No  . Sexual activity: Yes    Birth control/ protection: Surgical   Other Topics Concern  . None   Social History Narrative  . None    Family History  Problem Relation Age of Onset  . Diabetes Mother   . Breast cancer Mother 52  . Cancer Mother   . Diabetes Maternal Grandmother   . Stomach cancer Maternal Grandfather   . Breast cancer Maternal Aunt     dx in her 27s  . Stomach cancer Maternal Aunt

## 2017-01-03 ENCOUNTER — Encounter (HOSPITAL_COMMUNITY): Payer: Self-pay

## 2017-01-16 ENCOUNTER — Encounter: Payer: Self-pay | Admitting: Physician Assistant

## 2017-01-16 ENCOUNTER — Ambulatory Visit: Payer: Self-pay | Admitting: Physician Assistant

## 2017-01-16 VITALS — BP 116/66 | HR 82 | Temp 97.9°F | Ht 58.5 in | Wt 156.0 lb

## 2017-01-16 DIAGNOSIS — J029 Acute pharyngitis, unspecified: Secondary | ICD-10-CM

## 2017-01-16 DIAGNOSIS — J020 Streptococcal pharyngitis: Secondary | ICD-10-CM

## 2017-01-16 LAB — POCT RAPID STREP A (OFFICE): RAPID STREP A SCREEN: POSITIVE — AB

## 2017-01-16 MED ORDER — AMOXICILLIN 500 MG PO CAPS: 500.0000 mg | ORAL_CAPSULE | Freq: Three times a day (TID) | ORAL | 0 refills | Status: DC

## 2017-01-16 NOTE — Patient Instructions (Signed)
Strep Throat Strep throat is a bacterial infection of the throat. Your health care provider may call the infection tonsillitis or pharyngitis, depending on whether there is swelling in the tonsils or at the back of the throat. Strep throat is most common during the cold months of the year in children who are 5-38 years of age, but it can happen during any season in people of any age. This infection is spread from person to person (contagious) through coughing, sneezing, or close contact. What are the causes? Strep throat is caused by the bacteria called Streptococcus pyogenes. What increases the risk? This condition is more likely to develop in:  People who spend time in crowded places where the infection can spread easily.  People who have close contact with someone who has strep throat.  What are the signs or symptoms? Symptoms of this condition include:  Fever or chills.  Redness, swelling, or pain in the tonsils or throat.  Pain or difficulty when swallowing.  White or yellow spots on the tonsils or throat.  Swollen, tender glands in the neck or under the jaw.  Red rash all over the body (rare).  How is this diagnosed? This condition is diagnosed by performing a rapid strep test or by taking a swab of your throat (throat culture test). Results from a rapid strep test are usually ready in a few minutes, but throat culture test results are available after one or two days. How is this treated? This condition is treated with antibiotic medicine. Follow these instructions at home: Medicines  Take over-the-counter and prescription medicines only as told by your health care provider.  Take your antibiotic as told by your health care provider. Do not stop taking the antibiotic even if you start to feel better.  Have family members who also have a sore throat or fever tested for strep throat. They may need antibiotics if they have the strep infection. Eating and drinking  Do not  share food, drinking cups, or personal items that could cause the infection to spread to other people.  If swallowing is difficult, try eating soft foods until your sore throat feels better.  Drink enough fluid to keep your urine clear or pale yellow. General instructions  Gargle with a salt-water mixture 3-4 times per day or as needed. To make a salt-water mixture, completely dissolve -1 tsp of salt in 1 cup of warm water.  Make sure that all household members wash their hands well.  Get plenty of rest.  Stay home from school or work until you have been taking antibiotics for 24 hours.  Keep all follow-up visits as told by your health care provider. This is important. Contact a health care provider if:  The glands in your neck continue to get bigger.  You develop a rash, cough, or earache.  You cough up a thick liquid that is green, yellow-brown, or bloody.  You have pain or discomfort that does not get better with medicine.  Your problems seem to be getting worse rather than better.  You have a fever. Get help right away if:  You have new symptoms, such as vomiting, severe headache, stiff or painful neck, chest pain, or shortness of breath.  You have severe throat pain, drooling, or changes in your voice.  You have swelling of the neck, or the skin on the neck becomes red and tender.  You have signs of dehydration, such as fatigue, dry mouth, and decreased urination.  You become increasingly sleepy, or   you cannot wake up completely.  Your joints become red or painful. This information is not intended to replace advice given to you by your health care provider. Make sure you discuss any questions you have with your health care provider. Document Released: 11/04/2000 Document Revised: 07/06/2016 Document Reviewed: 03/02/2015 Elsevier Interactive Patient Education  2017 Elsevier Inc.  

## 2017-01-16 NOTE — Progress Notes (Signed)
BP 116/66 (BP Location: Left Arm, Patient Position: Sitting, Cuff Size: Normal)   Pulse 82   Temp 97.9 F (36.6 C)   Ht 4' 10.5" (1.486 m)   Wt 156 lb (70.8 kg)   LMP 12/19/2016   SpO2 99%   BMI 32.05 kg/m    Subjective:    Patient ID: Monica Hancock, female    DOB: 11/25/1980, 36 y.o.   MRN: [id number]  HPI: Monica Hancock is a 36 y.o. female presenting on 01/16/2017 for Sore Throat (since Saturday, little cough, nasal congestion, B ear pain, green phlegm. pt took tylenol this morning at 7 AM. pt states she gets chills, pt states she has had fever (subjective))   HPI   Chief Complaint  Patient presents with  . Sore Throat    since Saturday, little cough, nasal congestion, B ear pain, green phlegm. pt took tylenol this morning at 7 AM. pt states she gets chills, pt states she has had fever (subjective)     Relevant past medical, surgical, family and social history reviewed and updated as indicated. Interim medical history since our last visit reviewed. Allergies and medications reviewed and updated.  Review of Systems  Constitutional: Positive for chills and fever (subjective). Negative for appetite change, diaphoresis, fatigue and unexpected weight change.  HENT: Positive for congestion, ear pain and sore throat. Negative for dental problem, drooling, facial swelling, hearing loss, mouth sores, sneezing, trouble swallowing and voice change.   Eyes: Negative for pain, discharge, redness, itching and visual disturbance.  Respiratory: Negative for cough, choking, shortness of breath and wheezing.   Cardiovascular: Negative for chest pain, palpitations and leg swelling.  Gastrointestinal: Negative for abdominal pain, blood in stool, constipation, diarrhea and vomiting.  Endocrine: Negative for cold intolerance, heat intolerance and polydipsia.  Genitourinary: Negative for decreased urine volume, dysuria and hematuria.  Musculoskeletal: Negative for arthralgias, back pain  and gait problem.  Skin: Negative for rash.  Allergic/Immunologic: Negative for environmental allergies.  Neurological: Positive for headaches. Negative for seizures, syncope and light-headedness.  Hematological: Negative for adenopathy.  Psychiatric/Behavioral: Negative for agitation, dysphoric mood and suicidal ideas. The patient is not nervous/anxious.     Per HPI unless specifically indicated above     Objective:    BP 116/66 (BP Location: Left Arm, Patient Position: Sitting, Cuff Size: Normal)   Pulse 82   Temp 97.9 F (36.6 C)   Ht 4' 10.5" (1.486 m)   Wt 156 lb (70.8 kg)   LMP 12/19/2016   SpO2 99%   BMI 32.05 kg/m   Wt Readings from Last 3 Encounters:  01/16/17 156 lb (70.8 kg)  12/30/16 155 lb (70.3 kg)  12/19/16 157 lb 4 oz (71.3 kg)    Physical Exam  Constitutional: She is oriented to person, place, and time. She appears well-developed and well-nourished.  HENT:  Head: Normocephalic and atraumatic.  Right Ear: Hearing, tympanic membrane, external ear and ear canal normal.  Left Ear: Hearing, tympanic membrane, external ear and ear canal normal.  Nose: Nose normal.  Mouth/Throat: Uvula is midline and oropharynx is clear and moist. No oropharyngeal exudate.  Neck: Neck supple.  Cardiovascular: Normal rate and regular rhythm.   Pulmonary/Chest: Effort normal and breath sounds normal. She has no wheezes.  Abdominal: Soft. Bowel sounds are normal. She exhibits no mass. There is no hepatosplenomegaly. There is no tenderness. There is no rebound and no guarding.  Musculoskeletal: She exhibits no edema.  Lymphadenopathy:    She has no  cervical adenopathy.  Neurological: She is alert and oriented to person, place, and time.  Skin: Skin is warm and dry.  Psychiatric: She has a normal mood and affect. Her behavior is normal.  Nursing note and vitals reviewed.   RST +    Assessment & Plan:    Encounter Diagnoses  Name Primary?  . Strep throat Yes  . Sore throat     rx amoxil Follow up as scheduled.  RTO sooner prn

## 2017-01-23 ENCOUNTER — Other Ambulatory Visit (HOSPITAL_COMMUNITY): Payer: Self-pay | Admitting: *Deleted

## 2017-01-23 DIAGNOSIS — R928 Other abnormal and inconclusive findings on diagnostic imaging of breast: Secondary | ICD-10-CM

## 2017-01-24 ENCOUNTER — Ambulatory Visit (HOSPITAL_COMMUNITY): Payer: Self-pay

## 2017-01-24 ENCOUNTER — Encounter (HOSPITAL_COMMUNITY): Payer: Self-pay

## 2017-01-24 ENCOUNTER — Ambulatory Visit (HOSPITAL_COMMUNITY): Payer: Self-pay | Admitting: Hematology & Oncology

## 2017-01-31 ENCOUNTER — Ambulatory Visit (HOSPITAL_COMMUNITY)
Admission: RE | Admit: 2017-01-31 | Discharge: 2017-01-31 | Disposition: A | Discharge status: Home / Self Care | Payer: PRIVATE HEALTH INSURANCE | Source: Ambulatory Visit | Attending: *Deleted | Admitting: *Deleted

## 2017-01-31 DIAGNOSIS — R928 Other abnormal and inconclusive findings on diagnostic imaging of breast: Secondary | ICD-10-CM | POA: Insufficient documentation

## 2017-02-13 ENCOUNTER — Encounter: Payer: Self-pay | Admitting: Physician Assistant

## 2017-02-14 ENCOUNTER — Encounter: Payer: Self-pay | Admitting: Physician Assistant

## 2017-02-14 ENCOUNTER — Ambulatory Visit: Payer: Self-pay | Admitting: Physician Assistant

## 2017-02-14 VITALS — BP 110/68 | HR 82 | Temp 98.2°F | Ht 58.5 in | Wt 159.0 lb

## 2017-02-14 DIAGNOSIS — L255 Unspecified contact dermatitis due to plants, except food: Secondary | ICD-10-CM

## 2017-02-14 MED ORDER — PREDNISONE 10 MG PO TABS: ORAL_TABLET | ORAL | 0 refills | Status: AC

## 2017-02-14 NOTE — Progress Notes (Signed)
   BP 110/68 (BP Location: Left Arm, Patient Position: Sitting, Cuff Size: Normal)   Pulse 82   Temp 98.2 F (36.8 C) (Other (Comment))   Ht 4' 10.5" (1.486 m)   Wt 159 lb (72.1 kg)   LMP 02/09/2017 (Exact Date)   SpO2 98%   BMI 32.67 kg/m    Subjective:    Patient ID: Monica Hancock, female    DOB: November 22, 1980, 36 y.o.   MRN: [id number]  HPI: Monica Hancock is a 36 y.o. female presenting on 02/14/2017 for Poison Ivy (c/o rash and itching for a week after pulling up weeds in yard , has tried hydrocortisone cream 0.5%, and calamine lotion with short term relief)   HPI   Chief Complaint  Patient presents with  . Poison Ivy    c/o rash and itching for a week after pulling up weeds in yard , has tried hydrocortisone cream 0.5%, and calamine lotion with short term relief   Rash all 4 extremities  Relevant past medical, surgical, family and social history reviewed and updated as indicated. Interim medical history since our last visit reviewed. Allergies and medications reviewed and updated.  CURRENT MEDS: None  Review of Systems  Constitutional: Negative for appetite change, chills, diaphoresis, fatigue, fever and unexpected weight change.  HENT: Negative for congestion, dental problem, drooling, ear pain, facial swelling, hearing loss, mouth sores, sneezing, sore throat, trouble swallowing and voice change.   Eyes: Negative for pain, discharge, redness, itching and visual disturbance.  Respiratory: Negative for cough, choking, shortness of breath and wheezing.   Cardiovascular: Negative for chest pain, palpitations and leg swelling.  Gastrointestinal: Negative for abdominal pain, blood in stool, constipation, diarrhea and vomiting.  Endocrine: Negative for cold intolerance, heat intolerance and polydipsia.  Genitourinary: Negative for decreased urine volume, dysuria and hematuria.  Musculoskeletal: Negative for arthralgias, back pain and gait problem.  Skin: Positive for  rash.  Allergic/Immunologic: Negative for environmental allergies.  Neurological: Negative for seizures, syncope, light-headedness and headaches.  Hematological: Negative for adenopathy.  Psychiatric/Behavioral: Negative for agitation, dysphoric mood and suicidal ideas. The patient is not nervous/anxious.     Per HPI unless specifically indicated above     Objective:    BP 110/68 (BP Location: Left Arm, Patient Position: Sitting, Cuff Size: Normal)   Pulse 82   Temp 98.2 F (36.8 C) (Other (Comment))   Ht 4' 10.5" (1.486 m)   Wt 159 lb (72.1 kg)   LMP 02/09/2017 (Exact Date)   SpO2 98%   BMI 32.67 kg/m   Wt Readings from Last 3 Encounters:  02/14/17 159 lb (72.1 kg)  01/16/17 156 lb (70.8 kg)  12/30/16 155 lb (70.3 kg)    Physical Exam  Constitutional: She is oriented to person, place, and time. She appears well-developed and well-nourished.  HENT:  Head: Normocephalic and atraumatic.  Pulmonary/Chest: Effort normal.  Neurological: She is alert and oriented to person, place, and time.  Skin: Rash noted. Rash is vesicular.  Red linear eruptions with some vesicles scattered all four extremities.  Psychiatric: She has a normal mood and affect. Her behavior is normal. Thought content normal.  Nursing note and vitals reviewed.       Assessment & Plan:   Encounter Diagnosis  Name Primary?  . Rhus dermatitis Yes    -discussed possible side effects of prednisone.  Will treat with 12 day taper.  Pt can use topicals prn -follow up as scheduled.  RTO sooner prn

## 2017-02-14 NOTE — Patient Instructions (Signed)
Poison Ivy Dermatitis Poison ivy dermatitis is inflammation of the skin that is caused by the allergens on the leaves of the poison ivy plant. The skin reaction often involves redness, swelling, blisters, and extreme itching. What are the causes? This condition is caused by a specific chemical (urushiol) found in the sap of the poison ivy plant. This chemical is sticky and can be easily spread to people, animals, and objects. You can get poison ivy dermatitis by:  Having direct contact with a poison ivy plant.  Touching animals, other people, or objects that have come in contact with poison ivy and have the chemical on them. What increases the risk? This condition is more likely to develop in:  People who are outdoors often.  People who go outdoors without wearing protective clothing, such as closed shoes, long pants, and a long-sleeved shirt. What are the signs or symptoms? Symptoms of this condition include:  Redness and itching.  A rash that often includes bumps and blisters. The rash usually appears 48 hours after exposure.  Swelling. This may occur if the reaction is more severe. Symptoms usually last for 1-2 weeks. However, the first time you develop this condition, symptoms may last 3-4 weeks. How is this diagnosed? This condition may be diagnosed based on your symptoms and a physical exam. Your health care provider may also ask you about any recent outdoor activity. How is this treated? Treatment for this condition will vary depending on how severe it is. Treatment may include:  Hydrocortisone creams or calamine lotions to relieve itching.  Oatmeal baths to soothe the skin.  Over-the-counter antihistamine tablets.  Oral steroid medicine for more severe outbreaks. Follow these instructions at home:  Take or apply over-the-counter and prescription medicines only as told by your health care provider.  Wash exposed skin as soon as possible with soap and cold water.  Use  hydrocortisone creams or calamine lotion as needed to soothe the skin and relieve itching.  Take oatmeal baths as needed. Use colloidal oatmeal. You can get this at your local pharmacy or grocery store. Follow the instructions on the packaging.  Do not scratch or rub your skin.  While you have the rash, wash clothes right after you wear them. How is this prevented?  Learn to identify the poison ivy plant and avoid contact with the plant. This plant can be recognized by the number of leaves. Generally, poison ivy has three leaves with flowering branches on a single stem. The leaves are typically glossy, and they have jagged edges that come to a point at the front.  If you have been exposed to poison ivy, thoroughly wash with soap and water right away. You have about 30 minutes to remove the plant resin before it will cause the rash. Be sure to wash under your fingernails because any plant resin there will continue to spread the rash.  When hiking or camping, wear clothes that will help you to avoid exposure on the skin. This includes long pants, a long-sleeved shirt, tall socks, and hiking boots. You can also apply preventive lotion to your skin to help limit exposure.  If you suspect that your clothes or outdoor gear came in contact with poison ivy, rinse them off outside with a garden hose before you bring them inside your house. Contact a health care provider if:  You have open sores in the rash area.  You have more redness, swelling, or pain in the affected area.  You have redness that spreads beyond  the rash area.  You have fluid, blood, or pus coming from the affected area.  You have a fever.  You have a rash over a large area of your body.  You have a rash on your eyes, mouth, or genitals.  Your rash does not improve after a few days. Get help right away if:  Your face swells or your eyes swell shut.  You have trouble breathing.  You have trouble swallowing. This  information is not intended to replace advice given to you by your health care provider. Make sure you discuss any questions you have with your health care provider. Document Released: 11/04/2000 Document Revised: 04/14/2016 Document Reviewed: 04/15/2015 Elsevier Interactive Patient Education  2017 [employer].

## 2017-04-18 ENCOUNTER — Encounter: Payer: Self-pay | Admitting: Physician Assistant

## 2017-04-18 ENCOUNTER — Ambulatory Visit: Payer: Self-pay | Admitting: Physician Assistant

## 2017-04-18 VITALS — BP 102/68 | HR 71 | Temp 98.1°F | Ht 58.5 in | Wt 159.5 lb

## 2017-04-18 DIAGNOSIS — E669 Obesity, unspecified: Secondary | ICD-10-CM

## 2017-04-18 DIAGNOSIS — E785 Hyperlipidemia, unspecified: Secondary | ICD-10-CM

## 2017-04-18 NOTE — Progress Notes (Signed)
   BP 102/68 (BP Location: Left Arm, Patient Position: Sitting, Cuff Size: Normal)   Pulse 71   Temp 98.1 F (36.7 C) (Other (Comment))   Ht 4' 10.5" (1.486 m)   Wt 159 lb 8 oz (72.3 kg)   LMP 04/09/2017 (Approximate)   SpO2 98%   BMI 32.77 kg/m    Subjective:    Patient ID: Monica Hancock, female    DOB: 25-Aug-1981, 36 y.o.   MRN: [id number]  HPI: Monica Hancock is a 36 y.o. female presenting on 04/18/2017 for Follow-up   HPI Pt is following lowfat diet.  Pt is doing well and has no complaints today   Relevant past medical, surgical, family and social history reviewed and updated as indicated. Interim medical history since our last visit reviewed. Allergies and medications reviewed and updated.  CURRENT MEDS: none  Review of Systems  Constitutional: Negative for appetite change, chills, diaphoresis, fatigue, fever and unexpected weight change.  HENT: Negative for congestion, dental problem, drooling, ear pain, facial swelling, hearing loss, mouth sores, sneezing, sore throat, trouble swallowing and voice change.   Eyes: Negative for pain, discharge, redness, itching and visual disturbance.  Respiratory: Negative for cough, choking, shortness of breath and wheezing.   Cardiovascular: Negative for chest pain, palpitations and leg swelling.  Gastrointestinal: Negative for abdominal pain, blood in stool, constipation, diarrhea and vomiting.  Endocrine: Negative for cold intolerance, heat intolerance and polydipsia.  Genitourinary: Negative for decreased urine volume, dysuria and hematuria.  Musculoskeletal: Negative for arthralgias, back pain and gait problem.  Skin: Negative for rash.  Allergic/Immunologic: Negative for environmental allergies.  Neurological: Negative for seizures, syncope, light-headedness and headaches.  Hematological: Negative for adenopathy.  Psychiatric/Behavioral: Negative for agitation, dysphoric mood and suicidal ideas. The patient is not  nervous/anxious.     Per HPI unless specifically indicated above     Objective:    BP 102/68 (BP Location: Left Arm, Patient Position: Sitting, Cuff Size: Normal)   Pulse 71   Temp 98.1 F (36.7 C) (Other (Comment))   Ht 4' 10.5" (1.486 m)   Wt 159 lb 8 oz (72.3 kg)   LMP 04/09/2017 (Approximate)   SpO2 98%   BMI 32.77 kg/m   Wt Readings from Last 3 Encounters:  04/18/17 159 lb 8 oz (72.3 kg)  02/14/17 159 lb (72.1 kg)  01/16/17 156 lb (70.8 kg)    Physical Exam  Constitutional: She is oriented to person, place, and time. She appears well-developed and well-nourished.  HENT:  Head: Normocephalic and atraumatic.  Neck: Neck supple.  Cardiovascular: Normal rate and regular rhythm.   Pulmonary/Chest: Effort normal and breath sounds normal.  Abdominal: Soft. Bowel sounds are normal. She exhibits no mass. There is no hepatosplenomegaly. There is no tenderness.  Musculoskeletal: She exhibits no edema.  Lymphadenopathy:    She has no cervical adenopathy.  Neurological: She is alert and oriented to person, place, and time.  Skin: Skin is warm and dry.  Psychiatric: She has a normal mood and affect. Her behavior is normal.  Vitals reviewed.       Assessment & Plan:   Encounter Diagnoses  Name Primary?  . Hyperlipidemia, unspecified hyperlipidemia type Yes  . Obesity, unspecified classification, unspecified obesity type, unspecified whether serious comorbidity present      -pt will follow up for PAP and recheck lipids 4 months. RTO sooner prn.  Mammogram will be due march 2019

## 2017-05-25 ENCOUNTER — Other Ambulatory Visit: Payer: Self-pay | Admitting: Physician Assistant

## 2017-05-25 DIAGNOSIS — E785 Hyperlipidemia, unspecified: Secondary | ICD-10-CM

## 2017-06-07 ENCOUNTER — Encounter: Payer: Self-pay | Admitting: Physician Assistant

## 2017-06-07 ENCOUNTER — Ambulatory Visit: Payer: Self-pay | Admitting: Physician Assistant

## 2017-06-07 VITALS — BP 96/68 | HR 65 | Temp 97.9°F | Ht 58.5 in | Wt 158.5 lb

## 2017-06-07 DIAGNOSIS — L259 Unspecified contact dermatitis, unspecified cause: Secondary | ICD-10-CM

## 2017-06-07 MED ORDER — PREDNISONE 10 MG PO TABS: ORAL_TABLET | ORAL | 0 refills | Status: AC

## 2017-06-07 NOTE — Patient Instructions (Signed)
Poison Ivy Dermatitis Poison ivy dermatitis is inflammation of the skin that is caused by the allergens on the leaves of the poison ivy plant. The skin reaction often involves redness, swelling, blisters, and extreme itching. What are the causes? This condition is caused by a specific chemical (urushiol) found in the sap of the poison ivy plant. This chemical is sticky and can be easily spread to people, animals, and objects. You can get poison ivy dermatitis by:  Having direct contact with a poison ivy plant.  Touching animals, other people, or objects that have come in contact with poison ivy and have the chemical on them.  What increases the risk? This condition is more likely to develop in:  People who are outdoors often.  People who go outdoors without wearing protective clothing, such as closed shoes, long pants, and a long-sleeved shirt.  What are the signs or symptoms? Symptoms of this condition include:  Redness and itching.  A rash that often includes bumps and blisters. The rash usually appears 48 hours after exposure.  Swelling. This may occur if the reaction is more severe.  Symptoms usually last for 1-2 weeks. However, the first time you develop this condition, symptoms may last 3-4 weeks. How is this diagnosed? This condition may be diagnosed based on your symptoms and a physical exam. Your health care provider may also ask you about any recent outdoor activity. How is this treated? Treatment for this condition will vary depending on how severe it is. Treatment may include:  Hydrocortisone creams or calamine lotions to relieve itching.  Oatmeal baths to soothe the skin.  Over-the-counter antihistamine tablets.  Oral steroid medicine for more severe outbreaks.  Follow these instructions at home:  Take or apply over-the-counter and prescription medicines only as told by your health care provider.  Wash exposed skin as soon as possible with soap and cold  water.  Use hydrocortisone creams or calamine lotion as needed to soothe the skin and relieve itching.  Take oatmeal baths as needed. Use colloidal oatmeal. You can get this at your local pharmacy or grocery store. Follow the instructions on the packaging.  Do not scratch or rub your skin.  While you have the rash, wash clothes right after you wear them. How is this prevented?  Learn to identify the poison ivy plant and avoid contact with the plant. This plant can be recognized by the number of leaves. Generally, poison ivy has three leaves with flowering branches on a single stem. The leaves are typically glossy, and they have jagged edges that come to a point at the front.  If you have been exposed to poison ivy, thoroughly wash with soap and water right away. You have about 30 minutes to remove the plant resin before it will cause the rash. Be sure to wash under your fingernails because any plant resin there will continue to spread the rash.  When hiking or camping, wear clothes that will help you to avoid exposure on the skin. This includes long pants, a long-sleeved shirt, tall socks, and hiking boots. You can also apply preventive lotion to your skin to help limit exposure.  If you suspect that your clothes or outdoor gear came in contact with poison ivy, rinse them off outside with a garden hose before you bring them inside your house. Contact a health care provider if:  You have open sores in the rash area.  You have more redness, swelling, or pain in the affected area.  You have   redness that spreads beyond the rash area.  You have fluid, blood, or pus coming from the affected area.  You have a fever.  You have a rash over a large area of your body.  You have a rash on your eyes, mouth, or genitals.  Your rash does not improve after a few days. Get help right away if:  Your face swells or your eyes swell shut.  You have trouble breathing.  You have trouble  swallowing. This information is not intended to replace advice given to you by your health care provider. Make sure you discuss any questions you have with your health care provider. Document Released: 11/04/2000 Document Revised: 04/14/2016 Document Reviewed: 04/15/2015 Elsevier Interactive Patient Education  2018 [employer]  

## 2017-06-07 NOTE — Progress Notes (Signed)
   BP 96/68 (BP Location: Left Arm, Patient Position: Sitting, Cuff Size: Normal)   Pulse 65   Temp 97.9 F (36.6 C) (Other (Comment))   Ht 4' 10.5" (1.486 m)   Wt 158 lb 8 oz (71.9 kg)   LMP 05/10/2017 (Approximate)   SpO2 99%   BMI 32.56 kg/m    Subjective:    Patient ID: Monica Hancock, female    DOB: Jun 20, 1981, 36 y.o.   MRN: [id number]  HPI: Monica Hancock is a 36 y.o. female presenting on 06/07/2017 for Rash ("thinks has poison oak again")   HPI  Chief Complaint  Patient presents with  . Rash    "thinks has poison oak again"    Rash started last week.     Relevant past medical, surgical, family and social history reviewed and updated as indicated. Interim medical history since our last visit reviewed. Allergies and medications reviewed and updated.  CURRENT MEDS: None  Review of Systems  Constitutional: Negative for appetite change, chills, diaphoresis, fatigue, fever and unexpected weight change.  HENT: Positive for sneezing. Negative for congestion, dental problem, drooling, ear pain, facial swelling, hearing loss, mouth sores, sore throat, trouble swallowing and voice change.   Eyes: Negative for pain, discharge, redness, itching and visual disturbance.  Respiratory: Positive for cough. Negative for choking, shortness of breath and wheezing.   Cardiovascular: Negative for chest pain, palpitations and leg swelling.  Gastrointestinal: Negative for abdominal pain, blood in stool, constipation, diarrhea and vomiting.  Endocrine: Negative for cold intolerance, heat intolerance and polydipsia.  Genitourinary: Negative for decreased urine volume, dysuria and hematuria.  Musculoskeletal: Negative for arthralgias, back pain and gait problem.  Skin: Positive for rash.  Allergic/Immunologic: Negative for environmental allergies.  Neurological: Negative for seizures, syncope, light-headedness and headaches.  Hematological: Negative for adenopathy.   Psychiatric/Behavioral: Negative for agitation, dysphoric mood and suicidal ideas. The patient is not nervous/anxious.     Per HPI unless specifically indicated above     Objective:    BP 96/68 (BP Location: Left Arm, Patient Position: Sitting, Cuff Size: Normal)   Pulse 65   Temp 97.9 F (36.6 C) (Other (Comment))   Ht 4' 10.5" (1.486 m)   Wt 158 lb 8 oz (71.9 kg)   LMP 05/10/2017 (Approximate)   SpO2 99%   BMI 32.56 kg/m   Wt Readings from Last 3 Encounters:  06/07/17 158 lb 8 oz (71.9 kg)  04/18/17 159 lb 8 oz (72.3 kg)  02/14/17 159 lb (72.1 kg)    Physical Exam  Constitutional: She is oriented to person, place, and time. She appears well-developed and well-nourished.  HENT:  Head: Normocephalic and atraumatic.  Pulmonary/Chest: Effort normal.  Neurological: She is alert and oriented to person, place, and time.  Skin: Skin is warm and dry. Rash noted. Rash is vesicular.  Linear vesicles scattered on all extremities.  Most on RUE.  No signs secondary infection  Psychiatric: She has a normal mood and affect. Her behavior is normal. Thought content normal.  Nursing note and vitals reviewed.       Assessment & Plan:   Encounter Diagnosis  Name Primary?  . Contact dermatitis, unspecified contact dermatitis type, unspecified trigger Yes     -rx prednisone taper.  Can use benedryl prn.  Counseled on poison ivy and gave handout. -follow up as scheduled. RTO sooner prn

## 2017-08-22 ENCOUNTER — Ambulatory Visit: Payer: Self-pay | Admitting: Physician Assistant

## 2017-09-04 ENCOUNTER — Ambulatory Visit: Payer: Self-pay | Admitting: Physician Assistant

## 2017-09-11 ENCOUNTER — Other Ambulatory Visit (HOSPITAL_COMMUNITY)
Admission: RE | Admit: 2017-09-11 | Discharge: 2017-09-11 | Disposition: A | Discharge status: Home / Self Care | Payer: PRIVATE HEALTH INSURANCE | Source: Ambulatory Visit | Attending: Physician Assistant | Admitting: Physician Assistant

## 2017-09-11 DIAGNOSIS — E785 Hyperlipidemia, unspecified: Secondary | ICD-10-CM

## 2017-09-11 LAB — LIPID PANEL
CHOL/HDL RATIO: 3.4 ratio
CHOLESTEROL: 193 mg/dL (ref 0–200)
HDL: 57 mg/dL (ref >40)
LDL Cholesterol: 121 mg/dL — ABNORMAL HIGH (ref 0–99)
Triglycerides: 77 mg/dL (ref <150)
VLDL: 15 mg/dL (ref 0–40)

## 2017-09-11 LAB — HEPATIC FUNCTION PANEL
ALBUMIN: 4.2 g/dL (ref 3.5–5.0)
ALK PHOS: 74 U/L (ref 38–126)
ALT: 23 U/L (ref 14–54)
AST: 21 U/L (ref 15–41)
BILIRUBIN TOTAL: 0.9 mg/dL (ref 0.3–1.2)
Bilirubin, Direct: 0.1 mg/dL (ref 0.1–0.5)
Indirect Bilirubin: 0.8 mg/dL (ref 0.3–0.9)
TOTAL PROTEIN: 7.9 g/dL (ref 6.5–8.1)

## 2017-09-18 ENCOUNTER — Ambulatory Visit: Payer: Self-pay | Admitting: Physician Assistant

## 2017-09-18 ENCOUNTER — Encounter: Payer: Self-pay | Admitting: Physician Assistant

## 2017-09-18 ENCOUNTER — Other Ambulatory Visit (HOSPITAL_COMMUNITY)
Admission: RE | Admit: 2017-09-18 | Discharge: 2017-09-18 | Disposition: A | Discharge status: Home / Self Care | Payer: PRIVATE HEALTH INSURANCE | Source: Ambulatory Visit | Attending: Physician Assistant | Admitting: Physician Assistant

## 2017-09-18 VITALS — BP 104/66 | HR 92 | Temp 97.9°F | Ht 58.5 in | Wt 159.5 lb

## 2017-09-18 DIAGNOSIS — Z124 Encounter for screening for malignant neoplasm of cervix: Secondary | ICD-10-CM

## 2017-09-18 DIAGNOSIS — E669 Obesity, unspecified: Secondary | ICD-10-CM

## 2017-09-18 DIAGNOSIS — E785 Hyperlipidemia, unspecified: Secondary | ICD-10-CM | POA: Insufficient documentation

## 2017-09-18 DIAGNOSIS — Z01419 Encounter for gynecological examination (general) (routine) without abnormal findings: Secondary | ICD-10-CM | POA: Insufficient documentation

## 2017-09-18 NOTE — Progress Notes (Signed)
BP 104/66 (BP Location: Left Arm, Patient Position: Sitting, Cuff Size: Normal)   Pulse 92   Temp 97.9 F (36.6 C)   Ht 4' 10.5" (1.486 m)   Wt 159 lb 8 oz (72.3 kg)   LMP 09/04/2017 (Approximate)   SpO2 99%   BMI 32.77 kg/m    Subjective:    Patient ID: Monica Hancock, female    DOB: [birth year], 36 y.o.   MRN: [id number]  HPI: Monica Hancock is a 36 y.o. female presenting on 09/18/2017 for Gynecologic Exam and Hyperlipidemia   HPI   Pt is doing well  Relevant past medical, surgical, family and social history reviewed and updated as indicated. Interim medical history since our last visit reviewed. Allergies and medications reviewed and updated.  No current outpatient prescriptions on file.   Review of Systems  Constitutional: Negative for appetite change, chills, diaphoresis, fatigue, fever and unexpected weight change.  HENT: Negative for congestion, dental problem, drooling, ear pain, facial swelling, hearing loss, mouth sores, sneezing, sore throat, trouble swallowing and voice change.   Eyes: Negative for pain, discharge, redness, itching and visual disturbance.  Respiratory: Negative for cough, choking, shortness of breath and wheezing.   Cardiovascular: Negative for chest pain, palpitations and leg swelling.  Gastrointestinal: Negative for abdominal pain, blood in stool, constipation, diarrhea and vomiting.  Endocrine: Negative for cold intolerance, heat intolerance and polydipsia.  Genitourinary: Negative for decreased urine volume, dysuria and hematuria.  Musculoskeletal: Negative for arthralgias, back pain and gait problem.  Skin: Negative for rash.  Allergic/Immunologic: Negative for environmental allergies.  Neurological: Negative for seizures, syncope, light-headedness and headaches.  Hematological: Negative for adenopathy.  Psychiatric/Behavioral: Negative for agitation, dysphoric mood and suicidal ideas. The patient is not nervous/anxious.     Per  HPI unless specifically indicated above     Objective:    BP 104/66 (BP Location: Left Arm, Patient Position: Sitting, Cuff Size: Normal)   Pulse 92   Temp 97.9 F (36.6 C)   Ht 4' 10.5" (1.486 m)   Wt 159 lb 8 oz (72.3 kg)   LMP 09/04/2017 (Approximate)   SpO2 99%   BMI 32.77 kg/m   Wt Readings from Last 3 Encounters:  09/18/17 159 lb 8 oz (72.3 kg)  06/07/17 158 lb 8 oz (71.9 kg)  04/18/17 159 lb 8 oz (72.3 kg)    Physical Exam  Constitutional: She is oriented to person, place, and time. She appears well-developed and well-nourished.  HENT:  Head: Normocephalic and atraumatic.  Neck: Neck supple.  Cardiovascular: Normal rate and regular rhythm.   Pulmonary/Chest: Effort normal and breath sounds normal.  Breast exam normal  Abdominal: Soft. Bowel sounds are normal. She exhibits no mass. There is no hepatosplenomegaly. There is no tenderness. There is no rebound and no guarding.  Genitourinary: Vagina normal and uterus normal. No breast swelling, tenderness, discharge or bleeding. There is no rash, tenderness or lesion on the right labia. There is no rash, tenderness or lesion on the left labia. Cervix exhibits no motion tenderness, no discharge and no friability. Right adnexum displays no mass, no tenderness and no fullness. Left adnexum displays no mass, no tenderness and no fullness.  Genitourinary Comments: (nurse Berenice assisted)  Musculoskeletal: She exhibits no edema.  Lymphadenopathy:    She has no cervical adenopathy.  Neurological: She is alert and oriented to person, place, and time.  Skin: Skin is warm and dry.  Psychiatric: She has a normal mood and affect. Her behavior is  normal.  Nursing note and vitals reviewed.   Results for orders placed or performed during the hospital encounter of 09/11/17  Lipid panel  Result Value Ref Range   Cholesterol 193 0 - 200 mg/dL   Triglycerides 77 <150 mg/dL   HDL 57 >40 mg/dL   Total CHOL/HDL Ratio 3.4 RATIO   VLDL 15  0 - 40 mg/dL   LDL Cholesterol 121 (H) 0 - 99 mg/dL  Hepatic function panel  Result Value Ref Range   Total Protein 7.9 6.5 - 8.1 g/dL   Albumin 4.2 3.5 - 5.0 g/dL   AST 21 15 - 41 U/L   ALT 23 14 - 54 U/L   Alkaline Phosphatase 74 38 - 126 U/L   Total Bilirubin 0.9 0.3 - 1.2 mg/dL   Bilirubin, Direct 0.1 0.1 - 0.5 mg/dL   Indirect Bilirubin 0.8 0.3 - 0.9 mg/dL      Assessment & Plan:   Encounter Diagnoses  Name Primary?  . Papanicolaou smear Yes  . Hyperlipidemia, unspecified hyperlipidemia type   . Obesity, unspecified classification, unspecified obesity type, unspecified whether serious comorbidity present     -reviewed labs with pt -pt couseled to watch lowfat diert and exercise regularly.  Discussed no medication due to no htn, smoking, dm.  Pt in agreement. -follow up one year.  RTO sooner prn

## 2017-09-21 ENCOUNTER — Encounter: Payer: Self-pay | Admitting: Physician Assistant

## 2017-09-21 NOTE — Progress Notes (Signed)
Pt called back and was notified that pap done on 09-18-17 was normal. Pt verbalized understanding.

## 2018-01-04 ENCOUNTER — Encounter: Payer: Self-pay | Admitting: Physician Assistant

## 2018-01-04 ENCOUNTER — Ambulatory Visit: Payer: Self-pay | Admitting: Physician Assistant

## 2018-01-04 VITALS — BP 132/84 | HR 94 | Temp 98.6°F | Wt 163.5 lb

## 2018-01-04 DIAGNOSIS — J00 Acute nasopharyngitis [common cold]: Secondary | ICD-10-CM

## 2018-01-04 MED ORDER — AMOXICILLIN 500 MG PO CAPS: 500.0000 mg | ORAL_CAPSULE | Freq: Three times a day (TID) | ORAL | 0 refills | Status: AC

## 2018-01-04 MED ORDER — PROMETHAZINE-DM 6.25-15 MG/5ML PO SYRP: 5.0000 mL | ORAL_SOLUTION | Freq: Four times a day (QID) | ORAL | 0 refills | Status: DC | PRN

## 2018-01-04 NOTE — Progress Notes (Signed)
   BP 132/84 (BP Location: Right Arm, Patient Position: Sitting, Cuff Size: Normal)   Pulse 94   Temp 98.6 F (37 C)   Wt 163 lb 8 oz (74.2 kg)   SpO2 99%   BMI 33.59 kg/m    Subjective:    Patient ID: Monica Hancock, female    DOB: October 15, 1981, 37 y.o.   MRN: [id number]  HPI: Monica Hancock is a 37 y.o. female presenting on 01/04/2018 for Cough (pt states cough started 3 weeks ago. pt she thinks she is wheezing now. pt c/o sneezing, runny nose, green mucous, chills, sore throat. pt states she has only been taking IBU which helped for the chills. pt states she was not able to eat today due to her sore throat.)   HPI Chief Complaint  Patient presents with  . Cough    pt states cough started 3 weeks ago. pt she thinks she is wheezing now. pt c/o sneezing, runny nose, green mucous, chills, sore throat. pt states she has only been taking IBU which helped for the chills. pt states she was not able to eat today due to her sore throat.    Relevant past medical, surgical, family and social history reviewed and updated as indicated. Interim medical history since our last visit reviewed. Allergies and medications reviewed and updated.  CURRENT MEDS: none  Review of Systems  Constitutional: Positive for chills.  HENT: Positive for congestion, sneezing and sore throat. Negative for dental problem.   Respiratory: Positive for cough and chest tightness.   Allergic/Immunologic: Positive for environmental allergies.    Per HPI unless specifically indicated above     Objective:    BP 132/84 (BP Location: Right Arm, Patient Position: Sitting, Cuff Size: Normal)   Pulse 94   Temp 98.6 F (37 C)   Wt 163 lb 8 oz (74.2 kg)   SpO2 99%   BMI 33.59 kg/m   Wt Readings from Last 3 Encounters:  01/04/18 163 lb 8 oz (74.2 kg)  09/18/17 159 lb 8 oz (72.3 kg)  06/07/17 158 lb 8 oz (71.9 kg)    Physical Exam  Constitutional: She is oriented to person, place, and time. She appears  well-developed and well-nourished.  HENT:  Head: Normocephalic and atraumatic.  Right Ear: Hearing, tympanic membrane, external ear and ear canal normal.  Left Ear: Hearing, tympanic membrane, external ear and ear canal normal.  Nose: Nose normal.  Mouth/Throat: Uvula is midline and oropharynx is clear and moist. No oropharyngeal exudate.  Neck: Neck supple.  Cardiovascular: Normal rate and regular rhythm.  Pulmonary/Chest: Effort normal and breath sounds normal. She has no wheezes.  Abdominal: Soft. Bowel sounds are normal. She exhibits no mass. There is no hepatosplenomegaly. There is no tenderness.  Musculoskeletal: She exhibits no edema.  Lymphadenopathy:    She has no cervical adenopathy.  Neurological: She is alert and oriented to person, place, and time.  Skin: Skin is warm and dry.  Psychiatric: She has a normal mood and affect. Her behavior is normal.  Vitals reviewed.       Assessment & Plan:     Encounter Diagnosis  Name Primary?  . Acute nasopharyngitis Yes    -rx amoxil in light of persisting x 3 wk with worsening. -rx promethazine dm for cough.  Pt counseled to avoid driving on this due to sedation -counseled rest, fluids, warm salt water gargles -follow up as scheduled.  RTO sooner prn

## 2018-01-08 ENCOUNTER — Ambulatory Visit: Payer: Self-pay | Admitting: Physician Assistant

## 2018-09-13 ENCOUNTER — Ambulatory Visit: Payer: Medicaid Other | Admitting: Physician Assistant

## 2018-09-13 ENCOUNTER — Encounter: Payer: Self-pay | Admitting: Physician Assistant

## 2018-09-13 VITALS — BP 120/88 | HR 82 | Temp 98.1°F | Ht 58.5 in | Wt 160.5 lb

## 2018-09-13 DIAGNOSIS — J029 Acute pharyngitis, unspecified: Secondary | ICD-10-CM

## 2018-09-13 LAB — POCT RAPID STREP A (OFFICE): Rapid Strep A Screen: NEGATIVE

## 2018-09-13 NOTE — Patient Instructions (Signed)

## 2018-09-13 NOTE — Progress Notes (Signed)
BP 120/88 (BP Location: Left Arm, Patient Position: Sitting, Cuff Size: Normal)   Pulse 82   Temp 98.1 F (36.7 C)   Ht 4' 10.5" (1.486 m)   Wt 160 lb 8 oz (72.8 kg)   SpO2 99%   BMI 32.97 kg/m    Subjective:    Patient ID: Monica Hancock, female    DOB: 23-Aug-1981, 37 y.o.   MRN: [id number]  HPI: Monica Hancock is a 37 y.o. female presenting on 09/13/2018 for Sore Throat (for about a week. on L side. pt worried it may be strep throat due to her daughter having it last week. pt has taken IBU which has not helped. pt states she feels chills.)   HPI  Chief Complaint  Patient presents with  . Sore Throat    for about a week. on L side. pt worried it may be strep throat due to her daughter having it last week. pt has taken IBU which has not helped. pt states she feels chills.     No fever. No cough no congestion  Relevant past medical, surgical, family and social history reviewed and updated as indicated. Interim medical history since our last visit reviewed. Allergies and medications reviewed and updated.  No current outpatient medications on file.   Review of Systems  Constitutional: Positive for chills. Negative for appetite change, diaphoresis, fatigue, fever and unexpected weight change.  HENT: Positive for sore throat, trouble swallowing and voice change. Negative for congestion, dental problem, drooling, ear pain, facial swelling, hearing loss, mouth sores and sneezing.   Eyes: Negative for pain, discharge, redness, itching and visual disturbance.  Respiratory: Negative for cough, choking, shortness of breath and wheezing.   Cardiovascular: Negative for chest pain, palpitations and leg swelling.  Gastrointestinal: Negative for abdominal pain, blood in stool, constipation, diarrhea and vomiting.  Endocrine: Negative for cold intolerance, heat intolerance and polydipsia.  Genitourinary: Negative for decreased urine volume, dysuria and hematuria.   Musculoskeletal: Negative for arthralgias, back pain and gait problem.  Skin: Negative for rash.  Allergic/Immunologic: Negative for environmental allergies.  Neurological: Negative for seizures, syncope, light-headedness and headaches.  Hematological: Negative for adenopathy.  Psychiatric/Behavioral: Negative for agitation, dysphoric mood and suicidal ideas. The patient is not nervous/anxious.     Per HPI unless specifically indicated above     Objective:    BP 120/88 (BP Location: Left Arm, Patient Position: Sitting, Cuff Size: Normal)   Pulse 82   Temp 98.1 F (36.7 C)   Ht 4' 10.5" (1.486 m)   Wt 160 lb 8 oz (72.8 kg)   SpO2 99%   BMI 32.97 kg/m   Wt Readings from Last 3 Encounters:  09/13/18 160 lb 8 oz (72.8 kg)  01/04/18 163 lb 8 oz (74.2 kg)  09/18/17 159 lb 8 oz (72.3 kg)    Physical Exam  Constitutional: She is oriented to person, place, and time. She appears well-developed and well-nourished.  HENT:  Head: Normocephalic and atraumatic.  Right Ear: Hearing, tympanic membrane, external ear and ear canal normal.  Left Ear: Hearing, tympanic membrane, external ear and ear canal normal.  Nose: Nose normal.  Mouth/Throat: Uvula is midline and oropharynx is clear and moist. No oropharyngeal exudate.  Neck: Neck supple.  Cardiovascular: Normal rate and regular rhythm.  Pulmonary/Chest: Effort normal and breath sounds normal. She has no wheezes.  Lymphadenopathy:    She has no cervical adenopathy.  Neurological: She is alert and oriented to person, place, and time.  Skin: Skin is warm and dry.  Psychiatric: She has a normal mood and affect. Her behavior is normal.  Vitals reviewed.   RST -    Assessment & Plan:       Encounter Diagnosis  Name Primary?  . Sore throat Yes   -Recommended warm salt water gargles, rest, fluids, IBU or APAP prn -pt to follow up here next week as scheduled for routine OV

## 2018-09-17 ENCOUNTER — Other Ambulatory Visit (HOSPITAL_COMMUNITY)
Admission: RE | Admit: 2018-09-17 | Discharge: 2018-09-17 | Disposition: A | Discharge status: Home / Self Care | Payer: PRIVATE HEALTH INSURANCE | Source: Ambulatory Visit | Attending: Physician Assistant | Admitting: Physician Assistant

## 2018-09-17 DIAGNOSIS — E785 Hyperlipidemia, unspecified: Secondary | ICD-10-CM | POA: Insufficient documentation

## 2018-09-17 LAB — LIPID PANEL
CHOLESTEROL: 183 mg/dL (ref 0–200)
HDL: 52 mg/dL (ref >40)
LDL Cholesterol: 117 mg/dL — ABNORMAL HIGH (ref 0–99)
TRIGLYCERIDES: 70 mg/dL (ref <150)
Total CHOL/HDL Ratio: 3.5 RATIO
VLDL: 14 mg/dL (ref 0–40)

## 2018-09-18 ENCOUNTER — Ambulatory Visit: Payer: Medicaid Other | Admitting: Physician Assistant

## 2018-09-18 ENCOUNTER — Encounter: Payer: Self-pay | Admitting: Physician Assistant

## 2018-09-18 VITALS — BP 107/65 | HR 60 | Temp 97.9°F | Ht 58.5 in | Wt 159.2 lb

## 2018-09-18 DIAGNOSIS — E669 Obesity, unspecified: Secondary | ICD-10-CM

## 2018-09-18 DIAGNOSIS — E785 Hyperlipidemia, unspecified: Secondary | ICD-10-CM

## 2018-09-18 DIAGNOSIS — Z1239 Encounter for other screening for malignant neoplasm of breast: Secondary | ICD-10-CM

## 2018-09-18 DIAGNOSIS — Z0000 Encounter for general adult medical examination without abnormal findings: Secondary | ICD-10-CM

## 2018-09-18 NOTE — Patient Instructions (Signed)

## 2018-09-18 NOTE — Progress Notes (Signed)
BP 107/65 (BP Location: Right Arm, Patient Position: Sitting, Cuff Size: Normal)   Pulse 60   Temp 97.9 F (36.6 C)   Ht 4' 10.5" (1.486 m)   Wt 159 lb 4 oz (72.2 kg)   SpO2 99%   BMI 32.72 kg/m    Subjective:    Patient ID: Monica Hancock, female    DOB: [birth year], 37 y.o.   MRN: [id number]  HPI: Monica Hancock is a 37 y.o. female presenting on 09/18/2018 for No chief complaint on file.   HPI   Pt is doing well and has no complaints today.  Her ST is feeling better (she was seen last week)  Relevant past medical, surgical, family and social history reviewed and updated as indicated. Interim medical history since our last visit reviewed. Allergies and medications reviewed and updated.  No current outpatient medications on file.    Review of Systems  Constitutional: Negative for appetite change, chills, diaphoresis, fatigue, fever and unexpected weight change.  HENT: Negative for congestion, dental problem, drooling, ear pain, facial swelling, hearing loss, mouth sores, sneezing, sore throat, trouble swallowing and voice change.   Eyes: Negative for pain, discharge, redness, itching and visual disturbance.  Respiratory: Negative for cough, choking, shortness of breath and wheezing.   Cardiovascular: Negative for chest pain, palpitations and leg swelling.  Gastrointestinal: Negative for abdominal pain, blood in stool, constipation, diarrhea and vomiting.  Endocrine: Negative for cold intolerance, heat intolerance and polydipsia.  Genitourinary: Negative for decreased urine volume, dysuria and hematuria.  Musculoskeletal: Negative for arthralgias, back pain and gait problem.  Skin: Negative for rash.  Allergic/Immunologic: Negative for environmental allergies.  Neurological: Negative for seizures, syncope, light-headedness and headaches.  Hematological: Negative for adenopathy.  Psychiatric/Behavioral: Negative for agitation, dysphoric mood and suicidal ideas. The  patient is not nervous/anxious.     Per HPI unless specifically indicated above     Objective:    BP 107/65 (BP Location: Right Arm, Patient Position: Sitting, Cuff Size: Normal)   Pulse 60   Temp 97.9 F (36.6 C)   Ht 4' 10.5" (1.486 m)   Wt 159 lb 4 oz (72.2 kg)   SpO2 99%   BMI 32.72 kg/m   Wt Readings from Last 3 Encounters:  09/18/18 159 lb 4 oz (72.2 kg)  09/13/18 160 lb 8 oz (72.8 kg)  01/04/18 163 lb 8 oz (74.2 kg)    Physical Exam  Constitutional: She is oriented to person, place, and time. She appears well-developed and well-nourished.  HENT:  Head: Normocephalic and atraumatic.  Neck: Neck supple.  Cardiovascular: Normal rate and regular rhythm.  Pulmonary/Chest: Effort normal and breath sounds normal.  Abdominal: Soft. Bowel sounds are normal. She exhibits no mass. There is no hepatosplenomegaly. There is no tenderness.  Musculoskeletal: She exhibits no edema.  Lymphadenopathy:    She has no cervical adenopathy.  Neurological: She is alert and oriented to person, place, and time.  Skin: Skin is warm and dry.  Psychiatric: She has a normal mood and affect. Her behavior is normal.  Vitals reviewed.   Results for orders placed or performed during the hospital encounter of 09/17/18  Lipid panel  Result Value Ref Range   Cholesterol 183 0 - 200 mg/dL   Triglycerides 70 <150 mg/dL   HDL 52 >40 mg/dL   Total CHOL/HDL Ratio 3.5 RATIO   VLDL 14 0 - 40 mg/dL   LDL Cholesterol 117 (H) 0 - 99 mg/dL  Assessment & Plan:    Encounter Diagnoses  Name Primary?  . Well adult exam Yes  . Hyperlipidemia, unspecified hyperlipidemia type   . Screening for breast cancer   . Obesity, unspecified classification, unspecified obesity type, unspecified whether serious comorbidity present     -reviewed labs with pt -Counseled on lowfat diet and regular exercise.  Reviewed risks and pt agreed no medication at this time -ordered Screening mammogram -Follow up 1 year.   RTO sooner prn

## 2018-09-21 ENCOUNTER — Telehealth (HOSPITAL_COMMUNITY): Payer: Self-pay | Admitting: Obstetrics and Gynecology

## 2018-09-21 NOTE — Telephone Encounter (Signed)
Left voicemail message regarding setting up screening mammogram.

## 2018-10-08 NOTE — Telephone Encounter (Signed)
error 

## 2019-07-08 ENCOUNTER — Telehealth: Payer: Self-pay | Admitting: Student

## 2019-07-08 ENCOUNTER — Other Ambulatory Visit (HOSPITAL_COMMUNITY): Payer: Self-pay | Admitting: Physician Assistant

## 2019-07-08 DIAGNOSIS — IMO0002 Reserved for concepts with insufficient information to code with codable children: Secondary | ICD-10-CM

## 2019-07-08 NOTE — Telephone Encounter (Signed)
Pt called c/o R breast lump that appeared yesterday, 07-08-19. Pt states she is past due for her mammogram and also mentions that her mother has hx of breast cancer.  Pt's last mammo was a R dx mammo and R breast US on 01-31-17 which recommended screening mammo in one year (01/2018).  LPN explained to patient that someone will call her for an appointment for a dx mammo at [facility]. Pt verbalized understanding.

## 2019-07-10 ENCOUNTER — Other Ambulatory Visit (HOSPITAL_COMMUNITY): Payer: Self-pay | Admitting: Physician Assistant

## 2019-07-10 DIAGNOSIS — N63 Unspecified lump in unspecified breast: Secondary | ICD-10-CM

## 2019-07-23 ENCOUNTER — Ambulatory Visit (HOSPITAL_COMMUNITY)
Admission: RE | Admit: 2019-07-23 | Discharge: 2019-07-23 | Disposition: A | Discharge status: Home / Self Care | Payer: Medicaid Other | Source: Ambulatory Visit | Attending: Physician Assistant | Admitting: Physician Assistant

## 2019-07-23 ENCOUNTER — Other Ambulatory Visit: Payer: Self-pay

## 2019-07-23 ENCOUNTER — Ambulatory Visit (HOSPITAL_COMMUNITY): Payer: Medicaid Other

## 2019-07-23 DIAGNOSIS — N6311 Unspecified lump in the right breast, upper outer quadrant: Secondary | ICD-10-CM | POA: Insufficient documentation

## 2019-07-23 DIAGNOSIS — IMO0002 Reserved for concepts with insufficient information to code with codable children: Secondary | ICD-10-CM

## 2019-07-23 DIAGNOSIS — N63 Unspecified lump in unspecified breast: Secondary | ICD-10-CM

## 2019-07-24 ENCOUNTER — Other Ambulatory Visit (HOSPITAL_COMMUNITY): Payer: Self-pay | Admitting: Physician Assistant

## 2019-07-30 ENCOUNTER — Other Ambulatory Visit (HOSPITAL_COMMUNITY): Payer: Self-pay | Admitting: *Deleted

## 2019-07-30 ENCOUNTER — Telehealth: Payer: Self-pay

## 2019-07-30 DIAGNOSIS — N6310 Unspecified lump in the right breast, unspecified quadrant: Secondary | ICD-10-CM

## 2019-07-30 NOTE — Telephone Encounter (Signed)
Left message for patient stating that I was calling to schedule an appointment with the [facility] program. Left name and number for her to call back.

## 2019-07-31 ENCOUNTER — Other Ambulatory Visit (HOSPITAL_COMMUNITY): Payer: Self-pay | Admitting: Obstetrics and Gynecology

## 2019-07-31 DIAGNOSIS — N6310 Unspecified lump in the right breast, unspecified quadrant: Secondary | ICD-10-CM

## 2019-08-01 ENCOUNTER — Encounter (HOSPITAL_COMMUNITY): Payer: Self-pay

## 2019-08-01 ENCOUNTER — Other Ambulatory Visit (HOSPITAL_COMMUNITY): Payer: Self-pay | Admitting: Obstetrics and Gynecology

## 2019-08-01 ENCOUNTER — Ambulatory Visit (HOSPITAL_COMMUNITY)
Admission: RE | Admit: 2019-08-01 | Discharge: 2019-08-01 | Disposition: A | Discharge status: Home / Self Care | Payer: Self-pay | Source: Ambulatory Visit | Attending: Obstetrics and Gynecology | Admitting: Obstetrics and Gynecology

## 2019-08-01 ENCOUNTER — Other Ambulatory Visit: Payer: Self-pay

## 2019-08-01 DIAGNOSIS — N6325 Unspecified lump in the left breast, overlapping quadrants: Secondary | ICD-10-CM | POA: Insufficient documentation

## 2019-08-01 DIAGNOSIS — Z1239 Encounter for other screening for malignant neoplasm of breast: Secondary | ICD-10-CM

## 2019-08-01 DIAGNOSIS — N6311 Unspecified lump in the right breast, upper outer quadrant: Secondary | ICD-10-CM | POA: Insufficient documentation

## 2019-08-01 DIAGNOSIS — N6320 Unspecified lump in the left breast, unspecified quadrant: Secondary | ICD-10-CM

## 2019-08-01 NOTE — Patient Instructions (Signed)
Explained breast self awareness with [name]. Patient did not need a Pap smear today due to last Pap smear was 09/18/2017. Let her know [facility] will cover Pap smears every 3 years unless has a history of abnormal Pap smears. Referred patient to the [facility] for a right breast biopsy per recommendation. Appointment scheduled for Friday, August 02, 2019 at 1345. Patient aware of appointment and will be there.  Monica Hancock verbalized understanding.  Ilhan Madan, Arvil Chaco, RN 8:48 AM

## 2019-08-01 NOTE — Progress Notes (Signed)
Patient referred to [facility] by her PCP due to having a diagnostic mammogram completed 07/23/2019 that a right breast biopsy is recommended for follow-up.  Pap Smear: Pap smear not completed today. Last Pap smear was 09/18/2017 at the [facility] and normal. Per patient has no history of an abnormal Pap smear. Last Pap smear result is in Epic.  Physical exam: Breasts Breasts symmetrical. No skin abnormalities bilateral breasts. No nipple retraction bilateral breasts. No nipple discharge bilateral breasts. No lymphadenopathy. Palpated a lump within the right breast at 10 o'clock 5 cm from the nipple. Palpated a pea sized lump within left breast at 3 o'clock 1.5 cm from the nipple. Reported left breast lump to Annabell Howells at the [facility] since not noted on diagnostic mammogram report and per patient was not aware of the lump. No complaints of pain or tenderness on exam. Referred patient to the [facility] for a right breast biopsy per recommendation. Appointment scheduled for Friday, August 02, 2019 at 1345.        Pelvic/Bimanual No Pap smear completed today since last Pap smear and HPV typing was 09/18/2017. Pap smear not indicated per [facility]CP guidelines.   Smoking History: Patient has never smoked.  Patient Navigation: Patient education provided. Access to services provided for patient through [facility] program.   Breast and Cervical Cancer Risk Assessment: Patient has a family history of her mother and a maternal aunt having breast cancer. Patient is positive for the BRCA 1 genetic mutations. Patient has no history of radiation treatment to the chest before age 16. Patient has no history of cervical dysplasia, immunocompromised, or DES exposure in-utero. Breast cancer risk assessment completed. No breast cancer risk calculated due to patient is positive for the BRCA 1 mutation.

## 2019-08-02 ENCOUNTER — Ambulatory Visit
Admission: RE | Admit: 2019-08-02 | Discharge: 2019-08-02 | Disposition: A | Discharge status: Home / Self Care | Payer: No Typology Code available for payment source | Source: Ambulatory Visit | Attending: Obstetrics and Gynecology | Admitting: Obstetrics and Gynecology

## 2019-08-02 DIAGNOSIS — N6310 Unspecified lump in the right breast, unspecified quadrant: Secondary | ICD-10-CM

## 2019-08-05 ENCOUNTER — Other Ambulatory Visit (HOSPITAL_COMMUNITY): Payer: Self-pay | Admitting: Nurse Practitioner

## 2019-08-05 ENCOUNTER — Other Ambulatory Visit: Payer: Self-pay | Admitting: Physician Assistant

## 2019-08-05 ENCOUNTER — Encounter (HOSPITAL_COMMUNITY): Payer: Self-pay | Admitting: *Deleted

## 2019-08-05 ENCOUNTER — Telehealth: Payer: Self-pay | Admitting: Physician Assistant

## 2019-08-05 DIAGNOSIS — C50919 Malignant neoplasm of unspecified site of unspecified female breast: Secondary | ICD-10-CM

## 2019-08-05 NOTE — Progress Notes (Signed)
Oncology Navigator Note:  Patient was referred to our office by the free clinic here in [facility].  I called patient today to introduce myself and provide information in how I will be involved with their care.  I provided information on their first visit and what to expect.  I made sure patient was aware of appointment time and directions to the cancer center.  My phone number was given so that she can call me with any questions or concerns.  She voices appreciation and understanding.

## 2019-08-05 NOTE — Telephone Encounter (Signed)
Called pt and discussed results of breast biopsy.  Referral made to oncology and appt has already been scheduled for this Wednesday.  Pt will stop by office later today to pick up application for [employer] financial assistance program.

## 2019-08-06 ENCOUNTER — Ambulatory Visit (HOSPITAL_COMMUNITY): Payer: PRIVATE HEALTH INSURANCE

## 2019-08-06 ENCOUNTER — Other Ambulatory Visit: Payer: PRIVATE HEALTH INSURANCE

## 2019-08-07 ENCOUNTER — Other Ambulatory Visit: Payer: Self-pay

## 2019-08-07 ENCOUNTER — Inpatient Hospital Stay (HOSPITAL_COMMUNITY): Payer: Medicaid Other | Attending: Hematology | Admitting: Hematology

## 2019-08-07 ENCOUNTER — Encounter (HOSPITAL_COMMUNITY): Payer: Self-pay | Admitting: Hematology

## 2019-08-07 VITALS — BP 121/82 | HR 105 | Temp 98.6°F | Resp 18 | Wt 158.4 lb

## 2019-08-07 DIAGNOSIS — Z803 Family history of malignant neoplasm of breast: Secondary | ICD-10-CM | POA: Insufficient documentation

## 2019-08-07 DIAGNOSIS — C50411 Malignant neoplasm of upper-outer quadrant of right female breast: Secondary | ICD-10-CM

## 2019-08-07 DIAGNOSIS — C50911 Malignant neoplasm of unspecified site of right female breast: Secondary | ICD-10-CM | POA: Diagnosis not present

## 2019-08-07 DIAGNOSIS — Z170 Estrogen receptor positive status [ER+]: Secondary | ICD-10-CM

## 2019-08-07 NOTE — Assessment & Plan Note (Addendum)
1.  Right breast infiltrating ductal carcinoma: - She felt a lump in her right breast on 06/30/2019. - Mammogram on 07/23/2019 showed lobulated hypodense mass with targeted ultrasound measuring hypoechoic irregular mass 2.3 x 2.0 x 1.7 cm at the 10 o'clock position, 5 cm from the nipple.  No evidence of axillary adenopathy.  No suspicious masses or microcalcifications in the left breast. - Ultrasound-guided biopsy on 08/02/2019 shows invasive mammary carcinoma, E-cadherin positive supporting ductal origin. -Tumor cells are negative for HER-2.  Estrogen is 70% positive.  PR is 0% and negative.  Proliferation Ki-67 is 80%. - I have recommended doing MRI of the breast. -She does report pain in her right arm if she carries any weight for the last 2 months.  Due to her young age and rapidity of development of breast mass, I have recommended doing a PET CT scan to rule out metastatic disease. - She was also reportedly BRCA1 positive, when checked in 2017. -Family history strongly suggestive with mother diagnosed with breast cancer at 27.  BRCA status unknown.  Maternal aunt also had breast cancer. -I have recommended bringing the reports of the genetic testing. -I have also called and talked to Dr. Lyndon Code about the receptor test results.  I have requested HER-2 testing by FISH.  This will be reported Friday. -We will see her back after the MRI and the PET scan to discuss further plan of action.

## 2019-08-07 NOTE — Progress Notes (Signed)
[facility] NOTE  Patient Care Team: Soyla Dryer, PA-C as PCP - General (Physician Assistant)  CHIEF COMPLAINTS/PURPOSE OF CONSULTATION:  Newly diagnosed right breast cancer  HISTORY OF PRESENTING ILLNESS:  Monica Hancock 38 y.o. female seen in consultation today at the request of Soyla Dryer, PA-C for newly diagnosed right breast cancer.  Patient felt a lump in her right breast on 06/30/2019.  Mammogram on 07/23/2019 showed a lobulated hypodense mass in the right breast upper outer quadrant.  Targeted ultrasound showed 2.3 x 2 x 1.7 cm hypoechoic irregular mass at 10 o'clock position, 5 cm from the nipple.  No evidence of axillary adenopathy.  No mammogram evidence of left breast mass.  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  Ki-67 was 80%.  She reported pain in her right arm for the last couple of months when she lifts any weight.  She reported that she was tested for BRCA1 positivity in 2017.  No other new pains reported.  Denies any headaches or vision changes.  She works as a Pharmacist, hospital in [employer].  She is a never smoker.  I reviewed her records extensively and collaborated the history with the patient.  SUMMARY OF ONCOLOGIC HISTORY: Oncology History   No history exists.    In terms of breast cancer risk profile:  She menarched at early age of 59 and premenopausal. She had 5 pregnancies, her first child was born at age 14 She did not receive OCPs She was never exposed to fertility medications or hormone replacement therapy.  Family history positive for mother diagnosed with breast cancer at age 13.  Maternal aunt also had breast cancer.  MEDICAL HISTORY:  Past Medical History:  Diagnosis Date  . BRCA1 positive   . Family history of BRCA1 gene positive   . Family history of breast cancer   . H/O Bell's palsy 2000   Left sided  . HSV infection      SURGICAL HISTORY: Past Surgical History:  Procedure Laterality Date  . TUBAL LIGATION      SOCIAL HISTORY: Social History   Socioeconomic History  . Marital status: Married    Spouse name: [name]  . Number of children: 5  . Years of education: Not on file  . Highest education level: 12th grade  Occupational History  . Not on file  Social Needs  . Financial resource strain: Somewhat hard  . Food insecurity    Worry: Sometimes true    Inability: Sometimes true  . Transportation needs    Medical: No    Non-medical: No  Tobacco Use  . Smoking status: Never Smoker  . Smokeless tobacco: Never Used  Substance and Sexual Activity  . Alcohol use: No  . Drug use: No  . Sexual activity: Yes    Birth control/protection: Surgical  Lifestyle  . Physical activity    Days per week: 0 days    Minutes per session: 0 min  . Stress: Only a little  Relationships  . Social connections    Talks on phone: More than three times a week    Gets together: More than three times a week    Attends religious service: More than 4 times per year    Active member of club or organization: Yes    Attends meetings of clubs or organizations: More than 4 times per year    Relationship status: Married  . Intimate partner  violence    Fear of current or ex partner: No    Emotionally abused: No    Physically abused: No    Forced sexual activity: No  Other Topics Concern  . Not on file  Social History Narrative  . Not on file    FAMILY HISTORY: Family History  Problem Relation Age of Onset  . Diabetes Mother   . Breast cancer Mother 59  . Cancer Mother   . Diabetes Maternal Grandmother   . Stomach cancer Maternal Grandfather   . Breast cancer Maternal Aunt        dx in her 37s  . Diabetes Maternal Aunt     ALLERGIES:  has No Known Allergies.  MEDICATIONS:  No current outpatient medications on file.   No current facility-administered medications for this visit.     REVIEW OF  SYSTEMS:   Constitutional: Denies fevers, chills or abnormal night sweats Eyes: Denies blurriness of vision, double vision or watery eyes Ears, nose, mouth, throat, and face: Denies mucositis or sore throat Respiratory: Denies cough, dyspnea or wheezes Cardiovascular: Denies palpitation, chest discomfort or lower extremity swelling Gastrointestinal:  Denies nausea, heartburn or change in bowel habits Skin: Denies abnormal skin rashes Lymphatics: Denies new lymphadenopathy or easy bruising Neurological:Denies numbness, tingling or new weaknesses Behavioral/Psych: Mood is stable, no new changes  Breast: Palpable lump in the right breast. All other systems were reviewed with the patient and are negative.  PHYSICAL EXAMINATION: ECOG PERFORMANCE STATUS: 0 - Asymptomatic  Vitals:   08/07/19 1515  BP: 121/82  Pulse: (!) 105  Resp: 18  Temp: 98.6 F (37 C)  SpO2: 100%   Filed Weights   08/07/19 1515  Weight: 158 lb 6.4 oz (71.8 kg)    GENERAL:alert, no distress and comfortable SKIN: skin color, texture, turgor are normal, no rashes or significant lesions EYES: normal, conjunctiva are pink and non-injected, sclera clear OROPHARYNX:no exudate, no erythema and lips, buccal mucosa, and tongue normal  NECK: supple, thyroid normal size, non-tender, without nodularity LYMPH:  no palpable lymphadenopathy in the cervical, axillary or inguinal LUNGS: clear to auscultation and percussion with normal breathing effort HEART: regular rate & rhythm and no murmurs and no lower extremity edema ABDOMEN:abdomen soft, non-tender and normal bowel sounds Musculoskeletal:no cyanosis of digits and no clubbing  PSYCH: alert & oriented x 3 with fluent speech NEURO: no focal motor/sensory deficits BREAST: 2.5 cm right breast mass palpable in the upper outer quadrant, freely mobile.  No palpable axillary adenopathy.  No palpable mass in the left breast.  Bilateral nipple areolar complex within normal  limits.  LABORATORY DATA:  I have reviewed the data as listed Lab Results  Component Value Date   WBC 8.9 12/11/2014   HGB 12.1 12/11/2014   HCT 35.6 (L) 12/11/2014   MCV 89.0 12/11/2014   PLT 321 12/11/2014   Lab Results  Component Value Date   NA 138 11/16/2016   K 4.2 11/16/2016   CL 106 11/16/2016   CO2 25 11/16/2016    RADIOGRAPHIC STUDIES: I have personally reviewed the radiological reports and agreed with the findings in the report.  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  Right breast infiltrating ductal carcinoma: - She felt a lump in her right breast on 06/30/2019. - Mammogram on 07/23/2019 showed lobulated hypodense mass with targeted ultrasound measuring hypoechoic irregular mass 2.3 x 2.0 x 1.7 cm at the 10 o'clock position, 5 cm  from the nipple.  No evidence of axillary adenopathy.  No suspicious masses or microcalcifications in the left breast. - Ultrasound-guided biopsy on 08/02/2019 shows invasive mammary carcinoma, E-cadherin positive supporting ductal origin. -Tumor cells are negative for HER-2.  Estrogen is 70% positive.  PR is 0% and negative.  Proliferation Ki-67 is 80%. - I have recommended doing MRI of the breast. -She does report pain in her right arm if she carries any weight for the last 2 months.  Due to her young age and rapidity of development of breast mass, I have recommended doing a PET CT scan to rule out metastatic disease. - She was also reportedly BRCA1 positive, when checked in 2017. -Family history strongly suggestive with mother diagnosed with breast cancer at 22.  BRCA status unknown.  Maternal aunt also had breast cancer. -I have recommended bringing the reports of the genetic testing. -I have also called and talked to Dr. Lyndon Code about the receptor test results.  I have requested HER-2 testing by FISH.  This will be reported Friday. -We will see her back after the MRI and the  PET scan to discuss further plan of action.   All questions were answered. The patient knows to call the clinic with any problems, questions or concerns.    Derek Jack, MD 08/07/19

## 2019-08-07 NOTE — Patient Instructions (Signed)
[place] at [facility] Discharge Instructions  You were seen today by Dr. Delton Coombes. He went over your history, family history and how you've been feeling lately. He discussed your biopsy results as well as further testing he would like to do. He will schedule you for a MRI of your breasts as well as a PET scan. He will see you back after your scans for follow up.   Thank you for choosing [place] at [facility] to provide your oncology and hematology care.  To afford each patient quality time with our provider, please arrive at least 15 minutes before your scheduled appointment time.   If you have a lab appointment with the [facility] please come in thru the  Main Entrance and check in at the main information desk  You need to re-schedule your appointment should you arrive 10 or more minutes late.  We strive to give you quality time with our providers, and arriving late affects you and other patients whose appointments are after yours.  Also, if you no show three or more times for appointments you may be dismissed from the clinic at the providers discretion.     Again, thank you for choosing [facility].  Our hope is that these requests will decrease the amount of time that you wait before being seen by our physicians.       _____________________________________________________________  Should you have questions after your visit to [facility], please contact our office at [phone] between the hours of 8:00 a.m. and 4:30 p.m.  Voicemails left after 4:00 p.m. will not be returned until the following business day.  For prescription refill requests, have your pharmacy contact our office and allow 72 hours.    Cancer Center Support Programs:   > Cancer Support Group  2nd Tuesday of the month 1pm-2pm, Journey Room

## 2019-08-08 NOTE — Progress Notes (Signed)
Late entry: I met with patient during the visit with Dr. Delton Coombes on 9/16. She was here today alone. Her biggest concern is finances.  She asked several times how to take care of things.  I assured her that once she completes the BCCCP paperwork; finances shouldn't be an issue.  I explained to her that she would be in contact with someone about this soon.  She became very tearful and upset.  After listening to her talk about her fears and concerns, I encouraged her to take it one step at a time and occasionally one day at a time.  I assured her that we will be here to support and encourage her. I told her that anytime she needs to call she can.  She is very appreciative of the encouragement and support.  I will plan to see her when she comes back in 2 weeks after her scans.

## 2019-08-12 ENCOUNTER — Other Ambulatory Visit: Payer: Self-pay

## 2019-08-12 ENCOUNTER — Ambulatory Visit (HOSPITAL_COMMUNITY)
Admission: RE | Admit: 2019-08-12 | Discharge: 2019-08-12 | Disposition: A | Discharge status: Home / Self Care | Payer: Medicaid Other | Source: Ambulatory Visit | Attending: Hematology | Admitting: Hematology

## 2019-08-12 DIAGNOSIS — Z170 Estrogen receptor positive status [ER+]: Secondary | ICD-10-CM | POA: Diagnosis present

## 2019-08-12 DIAGNOSIS — C50911 Malignant neoplasm of unspecified site of right female breast: Secondary | ICD-10-CM | POA: Diagnosis not present

## 2019-08-12 MED ORDER — GADOBUTROL 1 MMOL/ML IV SOLN
7.0000 mL | Freq: Once | INTRAVENOUS | Status: AC | PRN
  Administered 2019-08-12: 14:00:00 7 mL via INTRAVENOUS

## 2019-08-15 ENCOUNTER — Encounter (HOSPITAL_COMMUNITY)
Admission: RE | Admit: 2019-08-15 | Discharge: 2019-08-15 | Disposition: A | Discharge status: Home / Self Care | Payer: Medicaid Other | Source: Ambulatory Visit | Attending: Hematology | Admitting: Hematology

## 2019-08-15 ENCOUNTER — Other Ambulatory Visit: Payer: Self-pay

## 2019-08-15 DIAGNOSIS — Z170 Estrogen receptor positive status [ER+]: Secondary | ICD-10-CM | POA: Insufficient documentation

## 2019-08-15 DIAGNOSIS — Z803 Family history of malignant neoplasm of breast: Secondary | ICD-10-CM | POA: Diagnosis not present

## 2019-08-15 DIAGNOSIS — C50411 Malignant neoplasm of upper-outer quadrant of right female breast: Secondary | ICD-10-CM | POA: Diagnosis present

## 2019-08-15 DIAGNOSIS — C50911 Malignant neoplasm of unspecified site of right female breast: Secondary | ICD-10-CM

## 2019-08-15 LAB — GLUCOSE, CAPILLARY: Glucose-Capillary: 77 mg/dL (ref 70–99)

## 2019-08-15 MED ORDER — FLUDEOXYGLUCOSE F - 18 (FDG) INJECTION
8.8000 | Freq: Once | INTRAVENOUS | Status: AC | PRN
Start: 2019-08-15 — End: 2019-08-15
  Administered 2019-08-15: 15:00:00 8.8 via INTRAVENOUS

## 2019-08-19 ENCOUNTER — Inpatient Hospital Stay (HOSPITAL_COMMUNITY): Payer: Medicaid Other

## 2019-08-19 ENCOUNTER — Other Ambulatory Visit: Payer: Self-pay

## 2019-08-19 ENCOUNTER — Inpatient Hospital Stay (HOSPITAL_BASED_OUTPATIENT_CLINIC_OR_DEPARTMENT_OTHER): Payer: Medicaid Other | Admitting: Hematology

## 2019-08-19 ENCOUNTER — Encounter (HOSPITAL_COMMUNITY): Payer: Self-pay | Admitting: Hematology

## 2019-08-19 ENCOUNTER — Encounter (HOSPITAL_COMMUNITY): Payer: Self-pay | Admitting: *Deleted

## 2019-08-19 VITALS — BP 137/95 | HR 106 | Temp 97.8°F | Resp 18 | Wt 157.2 lb

## 2019-08-19 DIAGNOSIS — Z170 Estrogen receptor positive status [ER+]: Secondary | ICD-10-CM | POA: Diagnosis not present

## 2019-08-19 DIAGNOSIS — C50411 Malignant neoplasm of upper-outer quadrant of right female breast: Secondary | ICD-10-CM

## 2019-08-19 DIAGNOSIS — C50911 Malignant neoplasm of unspecified site of right female breast: Secondary | ICD-10-CM | POA: Diagnosis not present

## 2019-08-19 NOTE — Progress Notes (Signed)
I met with patient during the visit with Dr. Delton Coombes today.  I explained the treatment option presented to her by Dr. Delton Coombes today. She was given the opportunity to ask questions and all were answered to her satisfaction.

## 2019-08-19 NOTE — Patient Instructions (Addendum)
[place] at [facility] Discharge Instructions  You were seen today by Dr. Delton Coombes. He went over your recent scan results. Your cancer can be aggressive, which means we will need to treat you with chemotherapy first, then you go for surgery and then you would get radiation if needed. Treatments will be once every 3 weeks. You will be on a pill for at least five years after completing your therapy. He will schedule you for a port-a-cath placement as well as an ECHOcardiogram. TCHP- taxotere, carboplatin, herceptin and pergeta. He discussed in depth your plan and what medications will be used to treat you. He will see you back after your port placement for labs, treatment and follow up.   Thank you for choosing [place] at [facility] to provide your oncology and hematology care.  To afford each patient quality time with our provider, please arrive at least 15 minutes before your scheduled appointment time.   If you have a lab appointment with the [facility] please come in thru the  Main Entrance and check in at the main information desk  You need to re-schedule your appointment should you arrive 10 or more minutes late.  We strive to give you quality time with our providers, and arriving late affects you and other patients whose appointments are after yours.  Also, if you no show three or more times for appointments you may be dismissed from the clinic at the providers discretion.     Again, thank you for choosing [facility].  Our hope is that these requests will decrease the amount of time that you wait before being seen by our physicians.       _____________________________________________________________  Should you have questions after your visit to [facility] please contact our office at [phone] between the hours of 8:00 a.m. and 4:30 p.m.  Voicemails left after 4:00 p.m. will not be returned until the  following business day.  For prescription refill requests, have your pharmacy contact our office and allow 72 hours.    Cancer Center Support Programs:   > Cancer Support Group  2nd Tuesday of the month 1pm-2pm, Journey Room

## 2019-08-19 NOTE — Progress Notes (Signed)
[place], [place] [address]   CLINIC:  Medical Oncology/Hematology  PCP:  Soyla Dryer, PA-C [place] [address] [phone]   REASON FOR VISIT:  Follow-up for right breast cancer.  CURRENT THERAPY: Under work-up.    INTERVAL HISTORY:  Monica Hancock 38 y.o. female seen for follow-up of right breast cancer.  She underwent mammogram and MRI.  She complains of inability to lift the right shoulder above her head level.  She also has some soreness in the right arm.  This started about 2 months ago.  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  She reported some soreness in the right breast since the biopsy.    REVIEW OF SYSTEMS:  Review of Systems  All other systems reviewed and are negative.    PAST MEDICAL/SURGICAL HISTORY:  Past Medical History:  Diagnosis Date  . BRCA1 positive   . Family history of BRCA1 gene positive   . Family history of breast cancer   . H/O Bell's palsy 2000   Left sided  . HSV infection    Past Surgical History:  Procedure Laterality Date  . TUBAL LIGATION       SOCIAL HISTORY:  Social History   Socioeconomic History  . Marital status: Married    Spouse name: [name]  . Number of children: 5  . Years of education: Not on file  . Highest education level: 12th grade  Occupational History  . Not on file  Social Needs  . Financial resource strain: Somewhat hard  . Food insecurity    Worry: Sometimes true    Inability: Sometimes true  . Transportation needs    Medical: No    Non-medical: No  Tobacco Use  . Smoking status: Never Smoker  . Smokeless tobacco: Never Used  Substance and Sexual Activity  . Alcohol use: No  . Drug use: No  . Sexual activity: Yes    Birth control/protection: Surgical  Lifestyle  . Physical activity    Days per week: 0 days    Minutes per session: 0 min  . Stress: Only a little  Relationships  . Social connections   Talks on phone: More than three times a week    Gets together: More than three times a week    Attends religious service: More than 4 times per year    Active member of club or organization: Yes    Attends meetings of clubs or organizations: More than 4 times per year    Relationship status: Married  . Intimate partner violence    Fear of current or ex partner: No    Emotionally abused: No    Physically abused: No    Forced sexual activity: No  Other Topics Concern  . Not on file  Social History Narrative  . Not on file    FAMILY HISTORY:  Family History  Problem Relation Age of Onset  . Diabetes Mother   . Breast cancer Mother 56  . Cancer Mother   . Diabetes Maternal Grandmother   . Stomach cancer Maternal Grandfather   . Breast cancer Maternal Aunt        dx in her 28s  . Diabetes Maternal Aunt     CURRENT MEDICATIONS:  No outpatient encounter medications on file as of 08/19/2019.   No facility-administered encounter medications on file as of 08/19/2019.     ALLERGIES:  No Known Allergies   PHYSICAL  EXAM:  ECOG Performance status: 0  Vitals:   08/19/19 1555  BP: (!) 137/95  Pulse: (!) 106  Resp: 18  Temp: 97.8 F (36.6 C)  SpO2: 100%   Filed Weights   08/19/19 1555  Weight: 157 lb 3.2 oz (71.3 kg)    Physical Exam Vitals signs reviewed.  Constitutional:      Appearance: Normal appearance.  Cardiovascular:     Rate and Rhythm: Normal rate and regular rhythm.     Heart sounds: Normal heart sounds.  Pulmonary:     Effort: Pulmonary effort is normal.     Breath sounds: Normal breath sounds.  Abdominal:     General: There is no distension.     Palpations: Abdomen is soft. There is no mass.  Musculoskeletal:        General: No swelling.  Skin:    General: Skin is warm.  Neurological:     General: No focal deficit present.     Mental Status: She is alert and oriented to person, place, and time.  Psychiatric:        Mood and Affect: Mood normal.         Behavior: Behavior normal.      LABORATORY DATA:  I have reviewed the labs as listed.  CBC    Component Value Date/Time   WBC 8.9 12/11/2014 0915   RBC 4.00 12/11/2014 0915   HGB 12.1 12/11/2014 0915   HCT 35.6 (L) 12/11/2014 0915   PLT 321 12/11/2014 0915   MCV 89.0 12/11/2014 0915   MCH 30.3 12/11/2014 0915   MCHC 34.0 12/11/2014 0915   RDW 13.1 12/11/2014 0915   LYMPHSABS 4.3 (H) 07/28/2013 1520   MONOABS 0.4 07/28/2013 1520   EOSABS 0.1 07/28/2013 1520   BASOSABS 0.0 07/28/2013 1520   CMP Latest Ref Rng & Units 09/11/2017 11/16/2016 07/28/2013  Glucose 65 - 99 mg/dL - 97 75  BUN 7 - 25 mg/dL - 10 10  Creatinine 0.50 - 1.10 mg/dL - 0.62 0.50  Sodium 135 - 146 mmol/L - 138 138  Potassium 3.5 - 5.3 mmol/L - 4.2 3.9  Chloride 98 - 110 mmol/L - 106 100  CO2 20 - 31 mmol/L - 25 26  Calcium 8.6 - 10.2 mg/dL - 9.4 10.8(H)  Total Protein 6.5 - 8.1 g/dL 7.9 7.0 9.1(H)  Total Bilirubin 0.3 - 1.2 mg/dL 0.9 0.3 0.5  Alkaline Phos 38 - 126 U/L 74 62 95  AST 15 - 41 U/L 21 12 17   ALT 14 - 54 U/L 23 14 20        DIAGNOSTIC IMAGING:  I have independently reviewed the scans and discussed with the patient.    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  T2N0 right breast IDC, ER positive, HER-2 positive: - Lump felt in her right breast on 07/01/2019, mammogram on 07/23/2019 showed lobulated hypodense mass with no evidence of axillary adenopathy. -Ultrasound-guided biopsy on 08/02/2019 shows invasive mammary carcinoma, E-cadherin positive, HER-2 was negative by IHC, ER 70% positive, PR 0%, Ki-67 80%. - HER-2 by FISH positive. - MRI of the breast on 08/12/2019 showed 2.6 x 2.4 by 2.8 cm right breast enhancing mass in the upper outer quadrant of the right breast.  No abnormal lymph nodes. - PET CT scan on 08/15/2019 showed hypermetabolic right breast mass consistent with primary breast cancer.  Mild metabolic  activity associated right axillary lymph node favored reactive.  Intense  activity associated with soft tissue posterior to the uterus measuring approximately 3.2 x 2.9 cm, SUV max 12.8. - I had a prolonged discussion with the patient about the new findings on HER-2 testing, which was positive by FISH. - Based on the size of the tumor, I have recommended neoadjuvant chemotherapy with Herceptin and Pertuzumab.  We will use non-anthracycline-based regimen like TCHP.  We discussed the schedule and side effects in detail. - We will make a referral for port placement.  We will also obtain a baseline 2D echocardiogram. -She does report right arm soreness and difficulty lifting it up above the shoulder level for 2 months.  However PET CT scan did not show any activity.  2.  BRCA1 positivity: -She had myriad my risk panel in 2017 which showed BRCA1 heterozygous mutation positivity. - I had a prolonged discussion about the test results which increases her risk of breast cancer, ovarian cancer and pancreatic cancer. -I have recommended bilateral mastectomies and oophorectomies. - She had increased uptake behind the uterus with SUV 12.8, likely ovarian in origin.   Total time spent is 40 minutes with more than 50% of the time spent face-to-face discussing treatment plan, counseling and coordination of care.  Orders placed this encounter:  Orders Placed This Encounter  Procedures  . ECHOCARDIOGRAM COMPLETE      Derek Jack, [place] [phone]

## 2019-08-19 NOTE — Assessment & Plan Note (Addendum)
1.  T2N0 right breast IDC, ER positive, HER-2 positive: - Lump felt in her right breast on 07/01/2019, mammogram on 07/23/2019 showed lobulated hypodense mass with no evidence of axillary adenopathy. -Ultrasound-guided biopsy on 08/02/2019 shows invasive mammary carcinoma, E-cadherin positive, HER-2 was negative by IHC, ER 70% positive, PR 0%, Ki-67 80%. - HER-2 by FISH positive. - MRI of the breast on 08/12/2019 showed 2.6 x 2.4 by 2.8 cm right breast enhancing mass in the upper outer quadrant of the right breast.  No abnormal lymph nodes. - PET CT scan on 08/15/2019 showed hypermetabolic right breast mass consistent with primary breast cancer.  Mild metabolic activity associated right axillary lymph node favored reactive.  Intense activity associated with soft tissue posterior to the uterus measuring approximately 3.2 x 2.9 cm, SUV max 12.8. - I had a prolonged discussion with the patient about the new findings on HER-2 testing, which was positive by FISH. - Based on the size of the tumor, I have recommended neoadjuvant chemotherapy with Herceptin and Pertuzumab.  We will use non-anthracycline-based regimen like TCHP.  We discussed the schedule and side effects in detail. - We will make a referral for port placement.  We will also obtain a baseline 2D echocardiogram. -She does report right arm soreness and difficulty lifting it up above the shoulder level for 2 months.  However PET CT scan did not show any activity.  2.  BRCA1 positivity: -She had myriad my risk panel in 2017 which showed BRCA1 heterozygous mutation positivity. - I had a prolonged discussion about the test results which increases her risk of breast cancer, ovarian cancer and pancreatic cancer. -I have recommended bilateral mastectomies and oophorectomies. - She had increased uptake behind the uterus with SUV 12.8, likely ovarian in origin.

## 2019-08-20 ENCOUNTER — Encounter (HOSPITAL_COMMUNITY): Payer: Self-pay

## 2019-08-20 NOTE — Patient Instructions (Addendum)
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  You will be treated every three weeks with a combination of chemotherapy and immunotherapy medications - docetaxel (Taxotere); carboplatin; trastuzumab (Herceptin); and pertuzumab (Perjeta).  The intent of treatment is cure.  You will see the doctor regularly throughout treatment.  We will obtain blood work from you prior to every treatment and monitor your results to make sure it is safe to give your treatment. The doctor monitors your response to treatment by the way you are feeling, your blood work, and by obtaining scans periodically.  There will be wait times while you are here for treatment.  It will take about 30 minutes to 1 hour for your lab work to result.  Then there will be wait times while pharmacy mixes your medications.    Medications you will receive in the clinic prior to your chemotherapy medications:  Aloxi:  ALOXI is used in adults to help prevent nausea and vomiting that happens with certain chemotherapy drugs.  Aloxi is a long acting medication, and will remain in your system for about two days.   Emend:  This is an anti-nausea medication that is used with Aloxi to help prevent nausea and vomiting caused by chemotherapy.  Dexamethasone:  This is a steroid given prior to chemotherapy to help prevent allergic reactions; it may also help prevent and control nausea and diarrhea.   Benadryl:  This is a histamine blocker that helps prevent allergic/infusion reactions to your chemotherapy. This medication may cause dizziness/drowsiness.   Tylenol:  Given to help prevent infusion reactions.   Docetaxel (Taxotere)  About This Drug  Docetaxel is used to treat cancer. It is given in the vein (IV). After the first infusion, this medication will take 1 hour to infuse.  Possible Side Effects . Bone marrow suppression. This is a decrease in the  number of white blood cells, red blood cells, and platelets. This may raise your risk of infection, make you tired and weak (fatigue), and raise your risk of bleeding. . Fever in the setting of decreased white blood cells, which is a serious condition that can be lifethreatening . Soreness of the mouth and throat. You may have red areas, white patches, or sores that hurt. . Nausea and vomiting (throwing up) . Constipation (not able to move bowels) . Diarrhea (loose bowel movements) . Infections . Swelling of your legs, ankles and/or feet, or fluid build-up around your lungs, heart or elsewhere . Changes in the way food and drinks taste . Effects on the nerves are called peripheral neuropathy. You may feel numbness, tingling, or pain in your hands and feet. It may be hard for you to button your clothes, open jars, or walk as usual. The effect on the nerves may get worse with more doses of the drug. These effects get better in some people after the drug is stopped but it does not get better in all people. . Decreased appetite (decreased hunger) . Weakness . Pain . Muscle pain/aching . Trouble breathing . Changes in your nail color, you may have nail loss and/or brittle nail . Hair loss. Hair loss is often temporary, although there have been cases of permanent hair loss reported. Hair loss may happen suddenly or gradually. If you lose hair, you may lose it from your head, face, armpits, pubic area, chest, and/or legs. You may also notice your hair getting thin. . Allergic skin reaction.  You may develop blisters on your skin that are filled with fluid or a severe red rash all over your body that may be painful. . Allergic reactions, including anaphylaxis are rare but may happen in some patients. Signs of allergic reaction to this drug may be swelling of the face, feeling like your tongue or throat are swelling, trouble breathing, rash, itching, fever, chills, feeling dizzy, and/or feeling  that your heart is beating  Note: Not all possible side effects are included above.  Warnings and Precautions . Severe bone marrow suppression, including febrile neutropenia - fever in the setting of decreased white blood cells, which may be life threatening. . Severe allergic reactions, including anaphylaxis which can be life-threatening . Swelling (inflammation) in the colon in the setting of severely low white blood cells, which raises your risk of infection and can be life-threatening . Severe skin reactions, including redness, swelling or peeling of skin . Severe swelling in the eye or other changes in eyesight . Severe swelling of your legs, ankles and/or feet. Sometimes, fluid can build up in your lungs and/or around your heart causing you trouble breathing. . If you have a history of abnormal liver function, receive high doses of docetaxel, or have a history of lung cancer and have received treatment with a platinum (type of chemotherapy medication), you have an increased risk of death. . Severe weakness . This drug may raise your risk of getting a second cancer such as leukemia and myelodysplastic syndrome. . Severe peripheral neuropathy - numbness, tingling, or pain in your hands and feet . This drug contains alcohol and may affect your central nervous system. The central nervous system is made up of your brain and spinal cord. You may feel drunk during and after your treatment and it can impair your ability to drive or use machinery for one to two hours after infusion. . Tumor lysis syndrome: This drug may act on the cancer cells very quickly. This may affect how your kidneys work.  Note: Some of the side effects above are very rare. If you have concerns and/or questions, please discuss them with your medical team.  Important Information . This drug may be present in the saliva, tears, sweat, urine, stool, vomit, semen, and vaginal secretions. Talk to your doctor and/or  your nurse about the necessary precautions to take during this time.  Treating Side Effects . Manage tiredness by pacing your activities for the day. . Be sure to include periods of rest between energy-draining activities. . Get regular exercise. If you feel too tired to exercise vigorously, try taking a short walk. . To decrease the risk of infection, wash your hands regularly. . Avoid close contact with people who have a cold, the flu, or other infections. . Take your temperature as your doctor or nurse tells you, and whenever you feel like you may have a fever. . To help decrease the risk of bleeding, use a soft toothbrush. Check with your nurse before using dental floss. . Be very careful when using knives or tools. . Use an electric shaver instead of a razor. . Mouth care is very important and will help food taste better and improve your appetite. Your mouth care should consist of routine, gentle cleaning of your teeth or dentures and rinsing your mouth with a mixture of 1/2 teaspoon of salt in 8 ounces of water or 1/2 teaspoon of baking soda in 8 ounces of water. This should be done at least after each meal and at  bedtime. . If you have mouth sores, avoid mouthwash that has alcohol. Also avoid alcohol and smoking because they can bother your mouth and throat. . Ask your doctor or nurse about medicines that are available to help stop or lessen constipation and/ or diarrhea. . If you are not able to move your bowels, check with your doctor or nurse before you use enemas, laxatives, or suppositories. . Drink plenty of fluids (a minimum of eight glasses per day is recommended). . If you throw up or have loose bowel movements, you should drink more fluids so that you do not become dehydrated (lack of water in the body from losing too much fluid). . If you have diarrhea, eat low-fiber foods that are high in protein and calories and avoid foods that can irritate your digestive tracts or  lead to cramping. . To help with nausea and vomiting, eat small, frequent meals instead of three large meals a day. Choose foods and drinks that are at room temperature. Ask your nurse or doctor about other helpful tips and medicine that is available to help stop or lessen these symptoms. . To help with decreased appetite, eat foods high in calories and protein, such as meat, poultry, fish, dry beans, tofu, eggs, nuts, milk, yogurt, cheese, ice cream, pudding, and nutritional supplements. . Consider using sauces and spices to increase taste. Daily exercise, with your doctor's approval, may increase your appetite. Marland Kitchen Keeping your pain under control is important to your well-being. Please tell your doctor or nurse if you are experiencing pain. . If you get a rash do not put anything on it unless your doctor or nurse says you may. Keep the area around the rash clean and dry. Ask your doctor for medicine if your rash bothers you. Marland Kitchen Keeping your nails moisturized may help with brittleness. . To help with hair loss, wash with a mild shampoo and avoid washing your hair every day. . Avoid rubbing your scalp, pat your hair or scalp dry. . Avoid coloring your hair. . Limit your use of hair spray, electric curlers, blow dryers, and curling irons. . If you are interested in getting a wig, talk to your nurse. You can also call the [employer] at 800-ACS-2345 to find out information about the "Look Good, Feel Better" program close to where you live. It is a free program where women getting chemotherapy can learn about wigs, turbans and scarves as well as makeup techniques and skin and nail care.  Food and Drug Interactions . There are no known interactions of docetaxel with food. . This drug may interact with other medicines. Tell your doctor and pharmacist about all the prescription and over-the-counter medicines and dietary supplements (vitamins, minerals, herbs and others) that you are  taking at this time. Also, check with your doctor or pharmacist before starting any new prescription or over-the-counter medicines, or dietary supplements to make sure that there are no interactions.  When to Call the Doctor Call your doctor or nurse if you have any of these symptoms and/or any new or unusual symptoms: . Fever of 100.4 F (38 C) or higher . Chills . Blurred vision or other changes in eyesight . Easy bruising or bleeding . Wheezing or trouble breathing . Chest pain . Feeling dizzy or lightheaded . Tiredness that interferes with your daily activities . Pain in your mouth or throat that makes it hard to eat or drink . Nausea that stops you from eating or drinking and/or is not relieved  by prescribed medicines . Throwing up more than 3 times a day . Lasting loss of appetite or rapid weight loss of five pounds in a week . Diarrhea, 4 times in one day or diarrhea with lack of strength or a feeling of being dizzy . No bowel movement in 3 days or when you feel uncomfortable . Severe abdominal pain that does not go away . Blood in your stool . Numbness, tingling, or pain in your hands and feet . Swelling of legs, ankles, or feet . Weight gain of 5 pounds in one week (fluid retention) . Extreme weakness that interferes with normal activities . New rash and/or itching . Rash that is not relieved by prescribed medicines . Signs of inflammation/infection (redness, swelling, pain) of the tissue around your nails. . Signs of allergic reaction: swelling of the face, feeling like your tongue or throat are swelling, trouble breathing, rash, itching, fever, chills, feeling dizzy, and/or feeling that your heart is beating in a fast or not normal way. If this happens, call 911 for emergency care. . Flu-like symptoms: fever, headache, muscle and joint aches, and fatigue (low energy, feeling weak) . Signs of possible liver problems: dark urine, pale bowel movements, bad stomach pain,  feeling very tired and weak, unusual itching, or yellowing of the eyes or skin . Symptoms of being drunk, confusion, or being very sleepy . Confusion or agitation, decreased urine, nausea/vomiting, diarrhea, muscle cramping, numbness and/or tingling, seizures . General pain that does not go away or is not relieved by prescribed medicine . If you think you may be pregnant or have impregnated your partner  Reproduction Warnings . Pregnancy warning: This drug can have harmful effects on the unborn baby. Women of childbearing potential should use effective methods of birth control during your cancer treatment and for 6 months after treatment. Men with female partners of childbearing potential should use effective methods of birth control during your cancer treatment and for 3 months after your cancer treatment. Let your doctor know right away if you think you may be pregnant or may have impregnated your partner. . Breastfeeding warning: Women should not breastfeed during treatment and for 1 week after treatment because this drug could enter the breast milk and cause harm to a breastfeeding baby. . Fertility warning: In men, this drug may affect your ability to have children in the future. Talk with your doctor or nurse if you plan to have children. Ask for information on sperm banking.  Carboplatin  About This Drug  Carboplatin is used to treat cancer. It is given in the vein (IV).  It will take 30 minutes to infuse.   Possible Side Effects  . Bone marrow suppression. This is a decrease in the number of white blood cells, red blood cells, and platelets. This may raise your risk of infection, make you tired and weak (fatigue), and raise your risk of bleeding. . Nausea and vomiting (throwing up) . Weakness . Changes in your liver function . Changes in your kidney function . Electrolyte changes . Pain . Effects on the nerves are called peripheral neuropathy. You may feel numbness,  tingling, or pain in your hands and feet. It may be hard for you to button your clothes, open jars, or walk as usual. The effect on the nerves may get worse with more doses of the drug. These effects get better in some people after the drug is stopped but it does not get better in all people.  Note: Not all  possible side effects are included above.  Warnings and Precautions  . Severe bone marrow suppression . Allergic reactions, including anaphylaxis are rare but may happen in some patients. Signs of allergic reaction to this drug may be swelling of the face, feeling like your tongue or throat are swelling, trouble breathing, rash, itching, fever, chills, feeling dizzy, and/or feeling that your heart is beating in a fast or not normal way. If this happens, do not take another dose of this drug. You should get urgent medical treatment. . Severe nausea and vomiting . Peripheral neuropathy - The risk is increased if you are over the age of 54 or if you have received other medicine with risk of peripheral neuropathy. . Blurred vision, loss of vision or other changes in eyesight . Decreased hearing . Skin and tissue irritation including redness, pain, warmth, or swelling at the IV site if the drug leaks out of the vein and into nearby tissue . Severe changes in your kidney function, which can cause kidney failure . Severe changes in your liver function, which can cause liver failure  Note: Some of the side effects above are very rare. If you have concerns and/or questions, please discuss them with your medical team.   Important Information . This drug may be present in the saliva, tears, sweat, urine, stool, vomit, semen, and vaginal secretions. Talk to your doctor and/or your nurse about the necessary precautions to take during this time.  Treating Side Effects . Manage tiredness by pacing your activities for the day. . Be sure to include periods of rest between energy-draining activities. .  To decrease the risk of infection, wash your hands regularly. . Avoid close contact with people who have a cold, the flu, or other infections. . Take your temperature as your doctor or nurse tells you, and whenever you feel like you may have a fever. . To help decrease the risk of bleeding, use a soft toothbrush. Check with your nurse before using dental floss. . Be very careful when using knives or tools. . Use an electric shaver instead of a razor. . Drink plenty of fluids (a minimum of eight glasses per day is recommended). . If you throw up or have loose bowel movements, you should drink more fluids so that you do not become dehydrated (lack of water in the body from losing too much fluid). . To help with nausea and vomiting, eat small, frequent meals instead of three large meals a day. Choose foods and drinks that are at room temperature. Ask your nurse or doctor about other helpful tips and medicine that is available to help stop or lessen these symptoms. . If you have numbness and tingling in your hands and feet, be careful when cooking, walking, and handling sharp objects and hot liquids. Marland Kitchen Keeping your pain under control is important to your well-being. Please tell your doctor or nurse if you are experiencing pain.  Food and Drug Interactions  . There are no known interactions of carboplatin with food. . This drug may interact with other medicines. Tell your doctor and pharmacist about all the prescription and over-the-counter medicines and dietary supplements (vitamins, minerals, herbs and others) that you are taking. Also, check with your doctor or pharmacist before starting any new prescription or over-the-counter medicines, or dietary supplements to make sure that there are no interactions.  When to Call the Doctor  Call your doctor or nurse if you have any of these symptoms and/or any new or unusual symptoms: .  Fever of 100.4 F (38 C) or higher . Chills . Tiredness that  interferes with your daily activities . Feeling dizzy or lightheaded . Easy bleeding or bruising . Nausea that stops you from eating or drinking and/or is not relieved by prescribed medicines . Throwing up more than 3 times a day . Blurred vision or other changes in eyesight . Decrease in hearing or ringing in the ear . Signs of allergic reaction: swelling of the face, feeling like your tongue or throat are swelling, trouble breathing, rash, itching, fever, chills, feeling dizzy, and/or feeling that your heart is beating in a fast or not normal way. If this happens, call 911 for emergency care. . While you are getting this drug, please tell your nurse right away if you have any pain, redness, or swelling at the site of the IV infusion. . Signs of possible liver problems: dark urine, pale bowel movements, bad stomach pain, feeling very tired and weak, unusual itching, or yellowing of the eyes or skin . Decreased urine, or very dark urine . Numbness, tingling, or pain in your hands and feet . Pain that does not go away or is not relieved by prescribed medicine . If you think you may be pregnant  Reproduction Warnings  . Pregnancy warning: This drug may have harmful effects on the unborn baby. Women of childbearing potential should use effective methods of birth control during your cancer treatment. Let your doctor know right away if you think you may be pregnant. . Breastfeeding warning: It is not known if this drug passes into breast milk. For this reason, women should not breastfeed during treatment because this drug could enter the breast milk and cause harm to a breastfeeding baby. . Fertility warning: Human fertility studies have not been done with this drug. Talk with your doctor or nurse if you plan to have children. Ask for information on sperm or egg banking.   Trastuzumab-xxxx (Herceptin, [place], [place], [place], [place], [place])  About This Drug Trastuzumab-xxxx is used to  treat cancer. It is given in the vein (IV). After the first two infusions, this medication will take 30 minutes to infuse.   Possible Side Effects . Bone marrow suppression. This is a decrease in the number of white blood cells, red blood cells, and platelets. This may raise your risk of infection, make you tired and weak (fatigue), and raise your risk of bleeding. . Congestive heart failure - your heart has less ability to pump blood properly . Soreness of the mouth and throat. You may have red areas, white patches, or sores that hurt. . Nausea . Diarrhea (loose bowel movements) . Fever . Chills . Tiredness . Infection . Inflammation of nasal passages and throat . Changes in the way food and drinks taste . Weight loss . Headache . Trouble sleeping . Cough . Upper respiratory infection . Rash  Note: Each of the side effects above was reported in 10% or greater of patients treated with trastuzumab-xxxx. Not all possible side effects are included above.  Warnings and Precautions . Changes in the tissue of the heart and heart function. Some changes may happen that can cause your heart to have less ability to pump blood. This drug may also increase your risk of heart attack. . Serious and life-threatening lung problems such as inflammation (swelling) and scarring of the lungs which makes breathing difficult. . While you are getting this drug in your vein (IV), you may have a reaction to the drug. Sometimes you  may be given medication to stop or lessen these side effects. Your nurse will check you closely for these signs: fever or shaking chills, flushing, facial swelling, feeling dizzy, headache, trouble breathing, rash, itching, chest tightness, or chest pain. These reactions may happen after your infusion. If this happens, call 911 for emergency care. . Severe decrease in the number of white blood cells, especially when receiving this drug in combination with other chemotherapy.  This may raise your risk of infection which may be lifethreatening.  Note: Some of the side effects above are very rare. If you have concerns and/or questions, please discuss them with your medical team.  Important Information . This drug may be present in the saliva, tears, sweat, urine, stool, vomit, semen, and vaginal secretions. Talk to your doctor and/or your nurse about the necessary precautions to take during this time.  Treating Side Effects . Manage tiredness by pacing your activities for the day. . Be sure to include periods of rest between energy-draining activities. . To decrease the risk of infection, wash your hands regularly. . Avoid close contact with people who have a cold, the flu, or other infections. . Take your temperature as your doctor or nurse tells you, and whenever you feel like you may have a fever. . To help decrease the risk of bleeding, use a soft toothbrush. Check with your nurse before using dental floss. . Be very careful when using knives or tools. . Use an electric shaver instead of a razor. . Drink plenty of fluids (a minimum of eight glasses per day is recommended). . To help with nausea, eat small, frequent meals instead of three large meals a day. Choose foods and drinks that are at room temperature. Ask your nurse or doctor about other helpful tips and medicine that is available to help stop or lessen these symptoms. . Mouth care is very important. Your mouth care should consist of routine, gentle cleaning of your teeth or dentures and rinsing your mouth with a mixture of 1/2 teaspoon of salt in 8 ounces of water or 1/2 teaspoon of baking soda in 8 ounces of water. This should be done at least after each meal and at bedtime. . Taking good care of your mouth may help food taste better and improve your appetite. . If you have mouth sores, avoid mouthwash that has alcohol. Also avoid alcohol and smoking because they can bother your mouth and  throat. . If you throw up or have loose bowel movements, you should drink more fluids so that you do not become dehydrated (lack of water in the body from losing too much fluid). . If you have diarrhea, eat low-fiber foods that are high in protein and calories and avoid foods that can irritate your digestive tracts or lead to cramping. . Ask your nurse or doctor about medicine that can lessen or stop your diarrhea. . To help with weight loss, drink fluids that contribute calories (whole milk, juice, soft drinks, sweetened beverages, milkshakes, and nutritional supplements) instead of water. . Include a source of protein at every meal and snack, such as meat, poultry, fish, dry beans, tofu, eggs, nuts, milk, yogurt, cheese, ice cream, pudding, and nutritional supplements. . If you get a rash do not put anything on it unless your doctor or nurse says you may. Keep the area around the rash clean and dry. Ask your doctor for medicine if your rash bothers you. Marland Kitchen Keeping your pain under control is important to your well-being. Please  tell your doctor or nurse if you are experiencing pain. . If you are having trouble sleeping, talk to your nurse or doctor on tips to help you sleep better. . Infusion reactions may occur after your infusion. If this happens, call 911 for emergency care.  Food and Drug Interactions . There are no known interactions of trastuzumab-xxxx with food. . This drug may interact with other medicines. Tell your doctor and pharmacist about all the prescription and over-the-counter medicines and dietary supplements (vitamins, minerals, herbs and others) that you are taking at this time. Also, check with your doctor or pharmacist before starting any new prescription or over-the-counter medicines, or dietary supplements to make sure that there are no interactions.  When to Call the Doctor Call your doctor or nurse if you have any of these symptoms and/or any new or unusual  symptoms: . Fever of 100.4 F (38 C) or higher . Chills . Tiredness that interferes with your daily activities . Trouble falling or staying asleep . Feeling dizzy or lightheaded . A headache that does not go away . Easy bleeding or bruising . Wheezing or trouble breathing or dry cough . Coughing up yellow, green, or bloody mucus . Feeling that your heart is beating in a fast or not normal way (palpitations) . Chest pain or symptoms of a heart attack. Most heart attacks involve pain in the center of the chest that lasts more than a few minutes. The pain may go away and come back, or it can be constant. It can feel like pressure, squeezing, fullness, or pain. Sometimes pain is felt in one or both arms, the back, neck, jaw, or stomach. If any of these symptoms last 2 minutes, call 911. . Pain in your mouth or throat that makes it hard to eat or drink . Nausea that stops you from eating or drinking and/or is not relieved by prescribed medicines . Diarrhea, 4 times in one day or diarrhea with lack of strength or a feeling of being dizzy . Lasting loss of appetite or rapid weight loss of five pounds in a week . Swelling of arms, hand, legs, and/or feet . Weight gain of 5 pounds in one week (fluid retention) . A new rash and/or itching that is not relieved by prescribed medicines . Signs of infusion reaction: fever or shaking chills, flushing, facial swelling, feeling dizzy, headache, trouble breathing, rash, itching, chest tightness, or chest pain. If this happens call 911 for emergency care. . If you think you may be pregnant  Reproduction Warnings . Pregnancy warning: This drug can have harmful effects on the unborn baby. Women of childbearing potential should use effective methods of birth control during your cancer treatment and for 7 months after treatment. Let your doctor know right away if you think you may be pregnant during treatment or within 7 months of receiving treatment. .  Breastfeeding warning: It is not known if this drug passes into breast milk. For this reason, women should talk to their doctor about the risks and benefits of breastfeeding during treatment with this drug and for 7 months after treatment because this drug may enter the breast milk and cause harm to a breastfeeding baby. . Fertility warning: Human fertility studies have not been done with this drug. Talk with your doctor or nurse if you plan to have children. Ask for information on sperm or egg banking.  Pertuzumab (Perjeta)  About This Drug Pertuzumab is used to treat cancer. It is given in the vein (  IV).  After the initial infusion, this medication will take 30 minutes to infuse.   Possible Side Effects . Bone marrow suppression. This is a decrease in the number of white blood cells, red blood cells, and platelets. This may raise your risk of infection, make you tired and weak (fatigue), and raise your risk of bleeding. . Nausea and vomiting (throwing up) . Diarrhea (loose bowel movements) . Not able to move bowels (constipation) . Tiredness . Headache . Muscle pain/aching . Effects on the nerves are called peripheral neuropathy. You may feel numbness, tingling, or pain in your hands and feet. It may be hard for you to button your clothes, open jars, or walk as usual. The effect on the nerves may get worse with more doses of the drug. These effects get better in some people after the drug is stopped but it does not get better in all people. . Rash . Hair loss. Hair loss is often temporary, although with certain medicine, hair loss can sometimes be permanent. Hair loss may happen suddenly or gradually. If you lose hair, you may lose it from your head, face, armpits, pubic area, chest, and/or legs. You may also notice your hair getting thin. Note: Each of the side effects above was reported in 30% or greater of patients treated with pertuzumab. Not all possible side effects are  included above.  Warnings and Precautions . Congestive heart failure - your heart has less ability to pump blood properly. You may be short of breath. Your arms, hands, legs and feet may swell. . Allergic reactions, including anaphylaxis are rare but may happen in some patients. Signs of allergic reaction to this drug may be swelling of the face, feeling like your tongue or throat are swelling, trouble breathing, rash, itching, fever, chills, feeling dizzy, and/or feeling that your heart is beating in a fast or not normal way. If this happens, do not take another dose of this drug. You should get urgent medical treatment. . While you are getting this drug in your vein (IV), you may have a reaction to the drug. Sometimes you may be given medication to stop or lessen these side effects. Your nurse will check you closely for these signs: fever or shaking chills, flushing, facial swelling, feeling dizzy, headache, trouble breathing, rash, itching, chest tightness, or chest pain. These reactions may happen after your infusion. If this happens, call 911 for emergency care. Note: Some of the side effects above are very rare. If you have concerns and/or questions, please discuss them with your medical team.  Important Information . This drug may be present in the saliva, tears, sweat, urine, stool, vomit, semen, and vaginal secretions. Talk to your doctor and/or your nurse about the necessary precautions to take during this time.  Treating Side Effects . Manage tiredness by pacing your activities for the day. . Be sure to include periods of rest between energy-draining activities. . To decrease the risk of infection, wash your hands regularly. . Avoid close contact with people who have a cold, the flu, or other infections. . Take your temperature as your doctor or nurse tells you, and whenever you feel like you may have a fever. . To help decrease the risk of bleeding, use a soft toothbrush.  Check with your nurse before using dental floss. . Be very careful when using knives or tools. . Use an electric shaver instead of a razor. . Drink plenty of fluids (a minimum of eight glasses per day is recommended). Marland Kitchen  To help with nausea and vomiting, eat small, frequent meals instead of three large meals a day. Choose foods and drinks that are at room temperature. Ask your nurse or doctor about other helpful tips and medicine that is available to help stop or lessen these symptoms. . If you throw up or have loose bowel movements, you should drink more fluids so that you do not become dehydrated (lack of water in the body from losing too much fluid). . If you have diarrhea, eat low-fiber foods that are high in protein and calories and avoid foods that can irritate your digestive tracts or lead to cramping. . Ask your nurse or doctor about medicine that can lessen or stop your diarrhea or constipation. . If you are not able to move your bowels, check with your doctor or nurse before you use enemas, laxatives, or suppositories. . If you get a rash do not put anything on it unless your doctor or nurse says you may. Keep the area around the rash clean and dry. Ask your doctor for medicine if your rash bothers you. . If you have numbness and tingling in your hands and feet, be careful when cooking, walking, and handling sharp objects and hot liquids. . Infusion reactions may occur after your infusion. If this happens, call 911 for emergency care. . To help with hair loss, wash with a mild shampoo and avoid washing your hair every day. . Avoid rubbing your scalp, pat your hair or scalp dry. . Avoid coloring your hair. . Limit your use of hair spray, electric curlers, blow dryers, and curling irons. . If you are interested in getting a wig, talk to your nurse. You can also call the [facility] at 800-ACS-2345 to find out information about the "Look Good, Feel Better" program close to  where you live. It is a free program where women getting chemotherapy can learn about wigs, turbans and scarves as well as makeup techniques and skin and nail care. Marland Kitchen Keeping your pain under control is important to your well-being. Please tell your doctor or nurse if you are experiencing pain. . Get regular exercise. If you feel too tired to exercise vigorously, try taking a short walk.  Food and Drug Interactions . There are no known interactions of pertuzumab with food. . This drug may interact with other medicines. Tell your doctor and pharmacist about all the prescription and over-the-counter medicines and dietary supplements (vitamins, minerals, herbs and others) that you are taking at this time. Also, check with your doctor or pharmacist before starting any new prescription or over-the-counter medicines, or dietary supplements to make sure that there are no interactions.  When to Call the Doctor Call your doctor or nurse if you have any of these symptoms and/or any new or unusual symptoms: . Fever of 100.4 F (38 C) or higher . Chills . Trouble breathing . Tiredness or weakness that interferes with your daily activities . Feeling dizzy or lightheaded . Easy bleeding or bruising . Swelling of arms, legs, ankles, or feet . Weight gain of 5 pounds in one week (fluid retention) . Headache that does not go away . Nausea that stops you from eating or drinking and/or is not relieved by prescribed medicines . Throwing up more than 3 times a day . Diarrhea, 4 times in one day or diarrhea with lack of strength or a feeling of being dizzy . No bowel movement in 3 days or when you feel uncomfortable . A new rash  or a rash that is not relieved by prescribed medicines . Numbness, tingling, or pain in your hands and feet . Signs of allergic reaction: swelling of the face, feeling like your tongue or throat are swelling, trouble breathing, rash, itching, fever, chills, feeling dizzy, and/or  feeling that your heart is beating in a fast or not normal way. If this happens, call 911 for emergency care. . Signs of infusion reaction: fever or shaking chills, flushing, facial swelling, feeling dizzy, headache, trouble breathing, rash, itching, chest tightness, or chest pain. If this happens, call 911 for emergency care. . If you think you may be pregnant  Reproduction Warnings . Pregnancy warning: This drug can have harmful effects on the unborn baby. Women of childbearing potential should use effective methods of birth control during your cancer treatment and for 7 months after treatment. Let your doctor know right away if you think you may be pregnant. . Breastfeeding warning: It is not known if this drug passes into breast milk. For this reason, women should not breastfeed during treatment and for 7 months after treatment because this drug could enter the breast milk and cause harm to a breastfeeding baby. . Fertility warning: Human fertility studies have not been done with this drug. Talk with your doctor or nurse if you plan to have children. Ask for information on sperm or egg banking.  SELF CARE ACTIVITIES WHILE RECEIVING CHEMOTHERAPY:  Hydration Increase your fluid intake 48 hours prior to treatment and drink at least 8 to 12 cups (64 ounces) of water/decaffeinated beverages per day after treatment. You can still have your cup of coffee or soda but these beverages do not count as part of your 8 to 12 cups that you need to drink daily. No alcohol intake.  Medications Continue taking your normal prescription medication as prescribed.  If you start any new herbal or new supplements please let us know first to make sure it is safe.  Mouth Care Have teeth cleaned professionally before starting treatment. Keep dentures and partial plates clean. Use soft toothbrush and do not use mouthwashes that contain alcohol. Biotene is a good mouthwash that is available at most pharmacies or  may be ordered by calling [phone]. Use warm salt water gargles (1 teaspoon salt per 1 quart warm water) before and after meals and at bedtime. If you need dental work, please let the doctor know before you go for your appointment so that we can coordinate the best possible time for you in regards to your chemo regimen. You need to also let your dentist know that you are actively taking chemo. We may need to do labs prior to your dental appointment.  Skin Care Always use sunscreen that has not expired and with SPF (Sun Protection Factor) of 50 or higher. Wear hats to protect your head from the sun. Remember to use sunscreen on your hands, ears, face, & feet.  Use good moisturizing lotions such as udder cream, eucerin, or even Vaseline. Some chemotherapies can cause dry skin, color changes in your skin and nails.    . Avoid long, hot showers or baths. . Use gentle, fragrance-free soaps and laundry detergent. . Use moisturizers, preferably creams or ointments rather than lotions because the thicker consistency is better at preventing skin dehydration. Apply the cream or ointment within 15 minutes of showering. Reapply moisturizer at night, and moisturize your hands every time after you wash them.  Hair Loss (if your doctor says your hair will fall out)  .  If your doctor says that your hair is likely to fall out, decide before you begin chemo whether you want to wear a wig. You may want to shop before treatment to match your hair color. . Hats, turbans, and scarves can also camouflage hair loss, although some people prefer to leave their heads uncovered. If you go bare-headed outdoors, be sure to use sunscreen on your scalp. . Cut your hair short. It eases the inconvenience of shedding lots of hair, but it also can reduce the emotional impact of watching your hair fall out. . Don't perm or color your hair during chemotherapy. Those chemical treatments are already damaging to hair and can enhance  hair loss. Once your chemo treatments are done and your hair has grown back, it's OK to resume dyeing or perming hair.  With chemotherapy, hair loss is almost always temporary. But when it grows back, it may be a different color or texture. In older adults who still had hair color before chemotherapy, the new growth may be completely gray.  Often, new hair is very fine and soft.  Infection Prevention Please wash your hands for at least 30 seconds using warm soapy water. Handwashing is the #1 way to prevent the spread of germs. Stay away from sick people or people who are getting over a cold. If you develop respiratory systems such as green/yellow mucus production or productive cough or persistent cough let us know and we will see if you need an antibiotic. It is a good idea to keep a pair of gloves on when going into grocery stores/[employer] to decrease your risk of coming into contact with germs on the carts, etc. Carry alcohol hand gel with you at all times and use it frequently if out in public. If your temperature reaches 100.5 or higher please call the clinic and let us know.  If it is after hours or on the weekend please go to the ER if your temperature is over 100.5.  Please have your own personal thermometer at home to use.    Sex and bodily fluids If you are going to have sex, a condom must be used to protect the person that isn't taking chemotherapy. Chemo can decrease your libido (sex drive). For a few days after chemotherapy, chemotherapy can be excreted through your bodily fluids.  When using the toilet please close the lid and flush the toilet twice.  Do this for a few day after you have had chemotherapy.   Effects of chemotherapy on your sex life Some changes are simple and won't last long. They won't affect your sex life permanently.  Sometimes you may feel: . too tired . not strong enough to be very active . sick or sore  . not in the mood . anxious or low Your anxiety might not  seem related to sex. For example, you may be worried about the cancer and how your treatment is going. Or you may be worried about money, or about how you family are coping with your illness. These things can cause stress, which can affect your interest in sex. It's important to talk to your partner about how you feel. Remember - the changes to your sex life don't usually last long. There's usually no medical reason to stop having sex during chemo. The drugs won't have any long term physical effects on your performance or enjoyment of sex. Cancer can't be passed on to your partner during sex  Contraception It's important to use reliable contraception during  treatment. Avoid getting pregnant while you or your partner are having chemotherapy. This is because the drugs may harm the baby. Sometimes chemotherapy drugs can leave a man or woman infertile.  This means you would not be able to have children in the future. You might want to talk to someone about permanent infertility. It can be very difficult to learn that you may no longer be able to have children. Some people find counselling helpful. There might be ways to preserve your fertility, although this is easier for men than for women. You may want to speak to a fertility expert. You can talk about sperm banking or harvesting your eggs. You can also ask about other fertility options, such as donor eggs. If you have or have had breast cancer, your doctor might advise you not to take the contraceptive pill. This is because the hormones in it might affect the cancer.  It is not known for sure whether or not chemotherapy drugs can be passed on through semen or secretions from the vagina. Because of this some doctors advise people to use a barrier method if you have sex during treatment. This applies to vaginal, anal or oral sex. Generally, doctors advise a barrier method only for the time you are actually having the treatment and for about a week after your  treatment. Advice like this can be worrying, but this does not mean that you have to avoid being intimate with your partner. You can still have close contact with your partner and continue to enjoy sex.  Animals If you have cats or birds we just ask that you not change the litter or change the cage.  Please have someone else do this for you while you are on chemotherapy.   Food Safety During and After Cancer Treatment Food safety is important for people both during and after cancer treatment. Cancer and cancer treatments, such as chemotherapy, radiation therapy, and stem cell/bone marrow transplantation, often weaken the immune system. This makes it harder for your body to protect itself from foodborne illness, also called food poisoning. Foodborne illness is caused by eating food that contains harmful bacteria, parasites, or viruses.  Foods to avoid Some foods have a higher risk of becoming tainted with bacteria. These include: Marland Kitchen Unwashed fresh fruit and vegetables, especially leafy vegetables that can hide dirt and other contaminants . Raw sprouts, such as alfalfa sprouts . Raw or undercooked beef, especially ground beef, or other raw or undercooked meat and poultry . Fatty, fried, or spicy foods immediately before or after treatment.  These can sit heavy on your stomach and make you feel nauseous. . Raw or undercooked shellfish, such as oysters. . Sushi and sashimi, which often contain raw fish.  . Unpasteurized beverages, such as unpasteurized fruit juices, raw milk, raw yogurt, or cider . Undercooked eggs, such as soft boiled, over easy, and poached; raw, unpasteurized eggs; or foods made with raw egg, such as homemade raw cookie dough and homemade mayonnaise  Simple steps for food safety  Shop smart. . Do not buy food stored or displayed in an unclean area. . Do not buy bruised or damaged fruits or vegetables. . Do not buy cans that have cracks, dents, or bulges. . Pick up foods that  can spoil at the end of your shopping trip and store them in a cooler on the way home.  Prepare and clean up foods carefully. . Rinse all fresh fruits and vegetables under running water, and dry them with a  clean towel or paper towel. . Clean the top of cans before opening them. . After preparing food, wash your hands for 20 seconds with hot water and soap. Pay special attention to areas between fingers and under nails. . Clean your utensils and dishes with hot water and soap. Marland Kitchen Disinfect your kitchen and cutting boards using 1 teaspoon of liquid, unscented bleach mixed into 1 quart of water.    Dispose of old food. . Eat canned and packaged food before its expiration date (the "use by" or "best before" date). . Consume refrigerated leftovers within 3 to 4 days. After that time, throw out the food. Even if the food does not smell or look spoiled, it still may be unsafe. Some bacteria, such as Listeria, can grow even on foods stored in the refrigerator if they are kept for too long.  Take precautions when eating out. . At restaurants, avoid buffets and salad bars where food sits out for a long time and comes in contact with many people. Food can become contaminated when someone with a virus, often a norovirus, or another "bug" handles it. . Put any leftover food in a "to-go" container yourself, rather than having the server do it. And, refrigerate leftovers as soon as you get home. . Choose restaurants that are clean and that are willing to prepare your food as you order it cooked.   AT HOME MEDICATIONS:                                                                                                                                                                Compazine/Prochlorperazine 10mg  tablet. Take 1 tablet every 6 hours as needed for nausea/vomiting. (This can make you sleepy)   EMLA cream. Apply a quarter size amount to port site 1 hour prior to chemo. Do not rub in. Cover with  plastic wrap.    Diarrhea Sheet   If you are having loose stools/diarrhea, please purchase Imodium and begin taking as outlined:  At the first sign of poorly formed or loose stools you should begin taking Imodium (loperamide) 2 mg capsules. Take two tablets (4mg ) followed by one tablet (2mg ) every 2 hours - DO NOT EXCEED 8 tablets in 24 hours.  If it is bedtime and you are having loose stools, take 2 tablets at bedtime, then 2 tablets every 4 hours until morning.   Always call the [facility] if you are having loose stools/diarrhea that you can't get under control.  Loose stools/diarrhea leads to dehydration (loss of water) in your body.  We have other options of trying to get the loose stools/diarrhea to stop but you must let us know.   Constipation Sheet  Colace - 100 mg capsules - take 2 capsules daily.  If this  doesn't help then you can increase to 2 capsules twice daily.  Please call if the above does not work for you. Do not go more than 2 days without a bowel movement.  It is very important that you do not become constipated.  It will make you feel sick to your stomach (nausea) and can cause abdominal pain and vomiting.  Nausea Sheet   Compazine/Prochlorperazine 10mg  tablet. Take 1 tablet every 6 hours as needed for nausea/vomiting (This can make you drowsy).  If you are having persistent nausea (nausea that does not stop) please call the [facility] and let us know the amount of nausea that you are experiencing.  If you begin to vomit, you need to call the [facility] and if it is the weekend and you have vomited more than one time and can't get it to stop-go to the Emergency Room.  Persistent nausea/vomiting can lead to dehydration (loss of fluid in your body) and will make you feel very weak and unwell. Ice chips, sips of clear liquids, foods that are at room temperature, crackers, and toast tend to be better tolerated.    SYMPTOMS TO REPORT AS SOON AS POSSIBLE AFTER  TREATMENT:  FEVER GREATER THAN 100.5 F  CHILLS WITH OR WITHOUT FEVER  NAUSEA AND VOMITING THAT IS NOT CONTROLLED WITH YOUR NAUSEA MEDICATION  UNUSUAL SHORTNESS OF BREATH  UNUSUAL BRUISING OR BLEEDING  TENDERNESS IN MOUTH AND THROAT WITH OR WITHOUT   PRESENCE OF ULCERS  URINARY PROBLEMS  BOWEL PROBLEMS  UNUSUAL RASH      Wear comfortable clothing and clothing appropriate for easy access to any Portacath or PICC line. Let us know if there is anything that we can do to make your therapy better!    What to do if you need assistance after hours or on the weekends: CALL [phone].  HOLD on the line, do not hang up.  You will hear multiple messages but at the end you will be connected with a nurse triage line.  They will contact the doctor if necessary.  Most of the time they will be able to assist you.  Do not call the hospital operator.      I have been informed and understand all of the instructions given to me and have received a copy. I have been instructed to call the clinic [phone] or my family physician as soon as possible for continued medical care, if indicated. I do not have any more questions at this time but understand that I may call the [place] or the Patient Navigator at [phone] during office hours should I have questions or need assistance in obtaining follow-up care.

## 2019-08-20 NOTE — Addendum Note (Signed)
Addended by: Farley Ly on: 08/20/2019 08:49 AM   Modules accepted: Orders

## 2019-08-21 ENCOUNTER — Ambulatory Visit (HOSPITAL_COMMUNITY)
Admission: RE | Admit: 2019-08-21 | Discharge: 2019-08-21 | Disposition: A | Discharge status: Home / Self Care | Payer: Medicaid Other | Source: Ambulatory Visit | Attending: Hematology | Admitting: Hematology

## 2019-08-21 ENCOUNTER — Other Ambulatory Visit: Payer: Self-pay

## 2019-08-21 ENCOUNTER — Other Ambulatory Visit (HOSPITAL_COMMUNITY): Payer: Self-pay

## 2019-08-21 DIAGNOSIS — Z170 Estrogen receptor positive status [ER+]: Secondary | ICD-10-CM | POA: Diagnosis present

## 2019-08-21 DIAGNOSIS — C50911 Malignant neoplasm of unspecified site of right female breast: Secondary | ICD-10-CM

## 2019-08-21 DIAGNOSIS — C50411 Malignant neoplasm of upper-outer quadrant of right female breast: Secondary | ICD-10-CM

## 2019-08-22 ENCOUNTER — Encounter (HOSPITAL_COMMUNITY): Payer: Self-pay

## 2019-08-22 ENCOUNTER — Ambulatory Visit (HOSPITAL_COMMUNITY): Payer: No Typology Code available for payment source

## 2019-08-22 ENCOUNTER — Other Ambulatory Visit: Payer: Self-pay | Admitting: Radiology

## 2019-08-23 ENCOUNTER — Other Ambulatory Visit: Payer: Self-pay | Admitting: Student

## 2019-08-23 MED ORDER — LIDOCAINE-PRILOCAINE 2.5-2.5 % EX CREA: TOPICAL_CREAM | CUTANEOUS | 2 refills | Status: DC

## 2019-08-23 MED ORDER — PROCHLORPERAZINE MALEATE 10 MG PO TABS: 10.0000 mg | ORAL_TABLET | Freq: Four times a day (QID) | ORAL | 1 refills | Status: DC | PRN

## 2019-08-26 ENCOUNTER — Ambulatory Visit (HOSPITAL_COMMUNITY)
Admission: RE | Admit: 2019-08-26 | Discharge: 2019-08-26 | Disposition: A | Discharge status: Home / Self Care | Payer: Medicaid Other | Source: Ambulatory Visit | Attending: Hematology | Admitting: Hematology

## 2019-08-26 ENCOUNTER — Other Ambulatory Visit (HOSPITAL_COMMUNITY): Payer: Self-pay | Admitting: Hematology

## 2019-08-26 ENCOUNTER — Other Ambulatory Visit: Payer: Self-pay

## 2019-08-26 ENCOUNTER — Other Ambulatory Visit: Payer: Self-pay | Admitting: Radiology

## 2019-08-26 ENCOUNTER — Encounter (HOSPITAL_COMMUNITY): Payer: Self-pay

## 2019-08-26 DIAGNOSIS — C50911 Malignant neoplasm of unspecified site of right female breast: Secondary | ICD-10-CM

## 2019-08-26 HISTORY — PX: IR IMAGING GUIDED PORT INSERTION: IMG5740

## 2019-08-26 LAB — PROTIME-INR
INR: 1 (ref 0.8–1.2)
Prothrombin Time: 13.3 seconds (ref 11.4–15.2)

## 2019-08-26 LAB — CBC
HCT: 39.8 % (ref 36.0–46.0)
Hemoglobin: 12.4 g/dL (ref 12.0–15.0)
MCH: 24.4 pg — ABNORMAL LOW (ref 26.0–34.0)
MCHC: 31.2 g/dL (ref 30.0–36.0)
MCV: 78.3 fL — ABNORMAL LOW (ref 80.0–100.0)
Platelets: 437 10*3/uL — ABNORMAL HIGH (ref 150–400)
RBC: 5.08 MIL/uL (ref 3.87–5.11)
RDW: 14.6 % (ref 11.5–15.5)
WBC: 5.8 10*3/uL (ref 4.0–10.5)
nRBC: 0 % (ref 0.0–0.2)

## 2019-08-26 LAB — BASIC METABOLIC PANEL
Anion gap: 9 (ref 5–15)
BUN: 6 mg/dL (ref 6–20)
CO2: 24 mmol/L (ref 22–32)
Calcium: 9.4 mg/dL (ref 8.9–10.3)
Chloride: 105 mmol/L (ref 98–111)
Creatinine, Ser: 0.63 mg/dL (ref 0.44–1.00)
GFR calc Af Amer: >60 mL/min (ref >60)
GFR calc non Af Amer: >60 mL/min (ref >60)
Glucose, Bld: 115 mg/dL — ABNORMAL HIGH (ref 70–99)
Potassium: 3.1 mmol/L — ABNORMAL LOW (ref 3.5–5.1)
Sodium: 138 mmol/L (ref 135–145)

## 2019-08-26 MED ORDER — MIDAZOLAM HCL 2 MG/2ML IJ SOLN
INTRAMUSCULAR | Status: AC | PRN
  Administered 2019-08-26: 0.5 mg via INTRAVENOUS

## 2019-08-26 MED ORDER — SODIUM CHLORIDE 0.9 % IV SOLN: INTRAVENOUS | Status: DC

## 2019-08-26 MED ORDER — HEPARIN SODIUM (PORCINE) 1000 UNIT/ML IJ SOLN
INTRAMUSCULAR | Status: AC
  Filled 2019-08-26: qty 1

## 2019-08-26 MED ORDER — LIDOCAINE-EPINEPHRINE (PF) 1 %-1:200000 IJ SOLN
INTRAMUSCULAR | Status: AC | PRN
  Administered 2019-08-26: 10 mL
  Administered 2019-08-26: 20 mL

## 2019-08-26 MED ORDER — LIDOCAINE-EPINEPHRINE (PF) 1 %-1:200000 IJ SOLN
INTRAMUSCULAR | Status: AC
  Filled 2019-08-26: qty 30

## 2019-08-26 MED ORDER — MIDAZOLAM HCL 2 MG/2ML IJ SOLN
INTRAMUSCULAR | Status: AC | PRN
  Administered 2019-08-26: 1 mg via INTRAVENOUS

## 2019-08-26 MED ORDER — HEPARIN SOD (PORK) LOCK FLUSH 100 UNIT/ML IV SOLN
INTRAVENOUS | Status: AC
  Filled 2019-08-26: qty 5

## 2019-08-26 MED ORDER — CEFAZOLIN SODIUM-DEXTROSE 2-4 GM/100ML-% IV SOLN
2.0000 g | INTRAVENOUS | Status: AC
  Administered 2019-08-26: 2 g via INTRAVENOUS

## 2019-08-26 MED ORDER — FENTANYL CITRATE (PF) 100 MCG/2ML IJ SOLN
INTRAMUSCULAR | Status: AC | PRN
  Administered 2019-08-26: 50 ug via INTRAVENOUS

## 2019-08-26 MED ORDER — CEFAZOLIN SODIUM-DEXTROSE 2-4 GM/100ML-% IV SOLN
INTRAVENOUS | Status: AC
  Filled 2019-08-26: qty 100

## 2019-08-26 MED ORDER — MIDAZOLAM HCL 2 MG/2ML IJ SOLN
INTRAMUSCULAR | Status: AC
  Filled 2019-08-26: qty 2

## 2019-08-26 MED ORDER — FENTANYL CITRATE (PF) 100 MCG/2ML IJ SOLN
INTRAMUSCULAR | Status: AC | PRN
  Administered 2019-08-26: 25 ug via INTRAVENOUS

## 2019-08-26 MED ORDER — FENTANYL CITRATE (PF) 100 MCG/2ML IJ SOLN
INTRAMUSCULAR | Status: AC
  Filled 2019-08-26: qty 2

## 2019-08-26 NOTE — Procedures (Signed)
Interventional Radiology Procedure Note  Procedure: Placement of a right IJ approach single lumen PowerPort.  Tip is positioned at the superior cavoatrial junction and catheter is ready for immediate use.  Complications: No immediate Recommendations:  - Ok to shower tomorrow - Do not submerge for 7 days - Routine line care   Signed,  Climmie Buelow K. Eliyanna Ault, MD   

## 2019-08-26 NOTE — Progress Notes (Signed)
Patient on plan of care prior to pathways. 

## 2019-08-26 NOTE — H&P (Addendum)
Chief Complaint: Patient was seen in consultation today for Monica [facility] a Cath placement at the request of [facility]  Referring Physician(s): Firefighter  Supervising Physician: Jacqulynn Cadet  Patient Status: [facility] - Out-pt  History of Present Illness: [facility] is a 38 y.o. female   Breast Cancer  Dr Delton Coombes note 9/28:  T2N0 right breast IDC, ER positive, HER-2 positive: Based on the size of the tumor, I have recommended neoadjuvant chemotherapy with Herceptin and Pertuzumab.  We will use non-anthracycline-based regimen like TCHP.   Scheduled now for [facility] placement To start chemo tomorrow   Past Medical History:  Diagnosis Date   BRCA1 positive    Family history of BRCA1 gene positive    Family history of breast cancer    H/O Bell's palsy 2000   Left sided   HSV infection     Past Surgical History:  Procedure Laterality Date   TUBAL LIGATION      Allergies: Patient has no known allergies.  Medications: Prior to Admission medications   Medication Sig Start Date End Date Taking? Authorizing Provider  CARBOPLATIN IV Inject into the vein every 21 ( twenty-one) days. 08/27/19   [provider]  DOCEtaxel (TAXOTERE IV) Inject into the vein every 21 ( twenty-one) days. 08/27/19   [provider]  lidocaine-prilocaine (EMLA) cream Apply a small amount to port a cath site and cover with plastic wrap one hour prior to chemotherapy appointments 08/23/19   Derek Jack, MD  pertuzumab in sodium chloride 0.9 % 250 mL Inject into the vein every 21 ( twenty-one) days. 08/27/19   [provider]  prochlorperazine (COMPAZINE) 10 MG tablet Take 1 tablet (10 mg total) by mouth every 6 (six) hours as needed for nausea or vomiting. 08/23/19   Derek Jack, MD  trastuzumab-dkst 2 mg/kg in sodium chloride 0.9 % 250 mL Inject into the vein every 21 ( twenty-one) days. 08/27/19   [provider]     Family  History  Problem Relation Age of Onset   Diabetes Mother    Breast cancer Mother 18   Cancer Mother    Diabetes Maternal Grandmother    Stomach cancer Maternal Grandfather    Breast cancer Maternal Aunt        dx in her 41s   Diabetes Maternal Aunt     Social History   Socioeconomic History   Marital status: Married    Spouse name: [name]   Number of children: 5   Years of education: Not on file   Highest education level: 12th grade  Occupational History   Not on file  Social Needs   Financial resource strain: Somewhat hard   Food insecurity    Worry: Sometimes true    Inability: Sometimes true   Transportation needs    Medical: No    Non-medical: No  Tobacco Use   Smoking status: Never Smoker   Smokeless tobacco: Never Used  Substance and Sexual Activity   Alcohol use: No   Drug use: No   Sexual activity: Yes    Birth control/protection: Surgical  Lifestyle   Physical activity    Days per week: 0 days    Minutes per session: 0 min   Stress: Only a little  Relationships   Social connections    Talks on phone: More than three times a week    Gets together: More than three times a week    Attends religious service: More than 4 times per year  Active member of club or organization: Yes    Attends meetings of clubs or organizations: More than 4 times per year    Relationship status: Married  Other Topics Concern   Not on file  Social History Narrative   Not on file    Review of Systems: A 12 point ROS discussed and pertinent positives are indicated in the HPI above.  All other systems are negative.  Review of Systems  Constitutional: Negative for activity change, fatigue and fever.  Respiratory: Negative for cough and shortness of breath.   Gastrointestinal: Negative for abdominal pain.  Neurological: Negative for weakness.  Psychiatric/Behavioral: Negative for behavioral problems and confusion.    Vital Signs: BP (!) 142/87     Pulse 73    Temp 98.2 F (36.8 C) (Skin)    Resp 18    Ht 4' 11"  (1.499 m)    Wt 158 lb (71.7 kg)    LMP 08/23/2019    SpO2 100%    BMI 31.91 kg/m   Physical Exam Vitals signs reviewed.  Cardiovascular:     Rate and Rhythm: Normal rate and regular rhythm.     Heart sounds: Normal heart sounds.  Pulmonary:     Effort: Pulmonary effort is normal.     Breath sounds: Normal breath sounds.  Abdominal:     Palpations: Abdomen is soft.  Musculoskeletal: Normal range of motion.  Skin:    General: Skin is warm and dry.  Neurological:     Mental Status: She is alert and oriented to person, place, and time.  Psychiatric:        Mood and Affect: Mood normal.        Behavior: Behavior normal.        Thought Content: Thought content normal.        Judgment: Judgment normal.     Imaging: Mr Breast Bilateral W Wo Contrast Inc Cad  Result Date: 08/16/2019 CLINICAL DATA:  Ultrasound-guided core biopsy of a palpable mass in the 10 o'clock location of the RIGHT breast shows grade 2 invasive mammary carcinoma. LABS:  None obtained at the time of imaging. EXAM: BILATERAL BREAST MRI WITH AND WITHOUT CONTRAST TECHNIQUE: Multiplanar, multisequence MR images of both breasts were obtained prior to and following the intravenous administration of 7 ml of Gadavist Three-dimensional MR images were rendered by post-processing of the original MR data on an independent workstation. The three-dimensional MR images were interpreted, and findings are reported in the following complete MRI report for this study. Three dimensional images were evaluated at the independent DynaCad workstation COMPARISON:  08/02/2019 and earlier FINDINGS: Breast composition: c. Heterogeneous fibroglandular tissue. Background parenchymal enhancement: Moderate. Right breast: Within the UPPER-OUTER QUADRANT of the RIGHT breast there is an enhancing mass measuring 2.6 x 2.4 x 2.8 centimeters. Mass demonstrates washout type enhancement kinetics and  is consistent with known malignancy. Tissue marker clip artifact is identified within the central aspect of the mass. Left breast: No mass or abnormal enhancement. Lymph nodes: No abnormal appearing lymph nodes. Ancillary findings:  None. IMPRESSION: Known malignancy in the UPPER-OUTER QUADRANT of the RIGHT breast measuring 2.8 centimeters. LEFT breast is negative. No abnormal lymph nodes. RECOMMENDATION: Treatment plan for known RIGHT breast malignancy. BI-RADS CATEGORY  6: Known biopsy-proven malignancy. Electronically Signed   By: Nolon Nations M.D.   On: 08/16/2019 13:57   Nm Pet Image Initial (pi) Skull Base To Thigh  Result Date: 08/15/2019 CLINICAL DATA:  Initial treatment strategy for RIGHT breast carcinoma. EXAM:  NUCLEAR MEDICINE PET SKULL BASE TO THIGH TECHNIQUE: 8.8 mCi F-18 FDG was injected intravenously. Full-ring PET imaging was performed from the skull base to thigh after the radiotracer. CT data was obtained and used for attenuation correction and anatomic localization. Fasting blood glucose: 77 mg/dl COMPARISON:  None. FINDINGS: Mediastinal blood pool activity: SUV max 2.45 Liver activity: SUV max NA NECK: No hypermetabolic lymph nodes in the neck. Incidental CT findings: none CHEST: Hypermetabolic mass in the RIGHT breast measures 2.5 cm SUV max equal 14.5. Small normal appearing lymph node in the RIGHT axilla has mild metabolic activity SUV max equal 2.0. No internal mammary metastatic nodes. No central thoracic hypermetabolic nodes. Incidental CT findings: No suspicious pulmonary nodules. ABDOMEN/PELVIS: No abnormal metabolic activity liver, pancreas, adrenal glands or spleen. No hypermetabolic adenopathy. Intense activity associated with soft tissue posterior to the uterus measuring approximately 3.2 by 2.9 cm with SUV max equal 12.8. Incidental CT findings: none SKELETON: No focal hypermetabolic activity to suggest skeletal metastasis. Incidental CT findings: none IMPRESSION: 1.  Hypermetabolic RIGHT breast mass consistent primary breast carcinoma. 2. Mild metabolic activity associated RIGHT axial lymph node is favored reactive. 3. No central thoracic nodal metastasis.  No pulmonary metastasis. 4. Rounded metabolic lesion posterior to the uterus is favored benign physiologic activity of the RIGHT ovary. 5. No skeletal metastasis. Electronically Signed   By: Suzy Bouchard M.D.   On: 08/15/2019 16:50   Mm Clip Placement Right  Result Date: 08/02/2019 CLINICAL DATA:  Status post ultrasound-guided core needle biopsy of a right breast mass. Post biopsy clip mammograms. EXAM: DIAGNOSTIC RIGHT MAMMOGRAM POST ULTRASOUND BIOPSY COMPARISON:  Previous exam(s). FINDINGS: Mammographic images were obtained following ultrasound guided biopsy of a right breast mass. The ribbon shaped biopsy clip lies within the mass. IMPRESSION: Well-positioned ribbon shaped biopsy clip following ultrasound-guided core needle biopsy of the right breast. Final Assessment: Post Procedure Mammograms for Marker Placement Electronically Signed   By: Lajean Manes M.D.   On: 08/02/2019 14:38   [place] Rt Breast Bx W Loc Dev 1st Lesion Img Bx Spec US Guide  Addendum Date: 08/05/2019   ADDENDUM REPORT: 08/05/2019 12:11 ADDENDUM: Pathology revealed GRADE II INVASIVE MAMMARY CARCINOMA of the Right breast, upper outer at 10 o'clock, 5cmfn. This was found to be concordant by Dr. Lajean Manes. Pathology results were discussed with the patient by telephone by Soyla Dryer, PA-C of the [facility], per request. Soyla Dryer will arrange a surgical and oncology referral, per request. Pathology results reported by Terie Purser, RN on 08/05/2019. Electronically Signed   By: Lajean Manes M.D.   On: 08/05/2019 12:11   Result Date: 08/05/2019 CLINICAL DATA:  Patient presents for spot ultrasound-guided core needle biopsy of a 2.3 cm right breast upper outer quadrant mass. EXAM: ULTRASOUND GUIDED RIGHT BREAST CORE  NEEDLE BIOPSY COMPARISON:  Previous exam(s). FINDINGS: I met with the patient and we discussed the procedure of ultrasound-guided biopsy, including benefits and alternatives. We discussed the high likelihood of a successful procedure. We discussed the risks of the procedure, including infection, bleeding, tissue injury, clip migration, and inadequate sampling. Informed written consent was given. The usual time-out protocol was performed immediately prior to the procedure. Lesion quadrant: Upper outer quadrant Using sterile technique and 1% Lidocaine as local anesthetic, under direct ultrasound visualization, a 12 gauge spring-loaded device was used to perform biopsy of the upper-outer quadrant right breast mass using an inferior approach. At the conclusion of the procedure a ribbon shaped tissue marker clip  was deployed into the biopsy cavity. Follow up 2 view mammogram was performed and dictated separately. IMPRESSION: Ultrasound guided biopsy of a right breast mass. No apparent complications. Electronically Signed: By: Lajean Manes M.D. On: 08/02/2019 14:27    Labs:  CBC: No results for input(s): WBC, HGB, HCT, PLT in the last 8760 hours.  COAGS: No results for input(s): INR, APTT in the last 8760 hours.  BMP: No results for input(s): NA, K, CL, CO2, GLUCOSE, BUN, CALCIUM, CREATININE, GFRNONAA, GFRAA in the last 8760 hours.  Invalid input(s): CMP  LIVER FUNCTION TESTS: No results for input(s): BILITOT, AST, ALT, ALKPHOS, PROT, ALBUMIN in the last 8760 hours.  TUMOR MARKERS: No results for input(s): AFPTM, CEA, CA199, CHROMGRNA in the last 8760 hours.  Assessment and Plan:  Rt Breast Ca To start chemo tomorrow For [facility] placement today Risks and benefits of image guided port-a-catheter placement was discussed with the patient including, but not limited to bleeding, infection, pneumothorax, or fibrin sheath development and need for additional procedures.  All of the patient's questions  were answered, patient is agreeable to proceed. Consent signed and in chart.   Thank you for this interesting consult.  I greatly enjoyed meeting [facility] and look forward to participating in their care.  A copy of this report was sent to the requesting provider on this date.  Electronically Signed: Lavonia Drafts, PA-C 08/26/2019, 8:34 AM   I spent a total of  30 Minutes   in face to face in clinical consultation, greater than 50% of which was counseling/coordinating care for [facility] placement

## 2019-08-26 NOTE — Progress Notes (Signed)
START ON PATHWAY REGIMEN - Breast     A cycle is every 21 days:     Pertuzumab      Pertuzumab      Trastuzumab-xxxx      Trastuzumab-xxxx      Carboplatin      Docetaxel   **Always confirm dose/schedule in your pharmacy ordering system**  Patient Characteristics: Preoperative or Nonsurgical Candidate (Clinical Staging), Neoadjuvant Therapy followed by Surgery, Invasive Disease, Chemotherapy, HER2 Positive, ER Positive Therapeutic Status: Preoperative or Nonsurgical Candidate (Clinical Staging) AJCC M Category: cM0 AJCC Grade: G3 Breast Surgical Plan: Neoadjuvant Therapy followed by Surgery ER Status: Positive (+) AJCC 8 Stage Grouping: IB HER2 Status: Positive (+) AJCC T Category: cT2 AJCC N Category: cN0 PR Status: Positive (+) Intent of Therapy: Curative Intent, Discussed with Patient 

## 2019-08-26 NOTE — Discharge Instructions (Signed)
Implanted Port Insertion, Care After °This sheet gives you information about how to care for yourself after your procedure. Your health care provider may also give you more specific instructions. If you have problems or questions, contact your health care provider. °What can I expect after the procedure? °After the procedure, it is common to have: °· Discomfort at the port insertion site. °· Bruising on the skin over the port. This should improve over 3-4 days. °Follow these instructions at home: °Port care °· After your port is placed, you will get a manufacturer's information card. The card has information about your port. Keep this card with you at all times. °· Take care of the port as told by your health care provider. Ask your health care provider if you or a family member can get training for taking care of the port at home. A home health care nurse may also take care of the port. °· Make sure to remember what type of port you have. °Incision care ° °  ° °· Follow instructions from your health care provider about how to take care of your port insertion site. Make sure you: °? Wash your hands with soap and water before and after you change your bandage (dressing). If soap and water are not available, use hand sanitizer. °? Change your dressing as told by your health care provider. °? Leave stitches (sutures), skin glue, or adhesive strips in place. These skin closures may need to stay in place for 2 weeks or longer. If adhesive strip edges start to loosen and curl up, you may trim the loose edges. Do not remove adhesive strips completely unless your health care provider tells you to do that. °· Check your port insertion site every day for signs of infection. Check for: °? Redness, swelling, or pain. °? Fluid or blood. °? Warmth. °? Pus or a bad smell. °Activity °· Return to your normal activities as told by your health care provider. Ask your health care provider what activities are safe for you. °· Do not  lift anything that is heavier than 10 lb (4.5 kg), or the limit that you are told, until your health care provider says that it is safe. °General instructions °· Take over-the-counter and prescription medicines only as told by your health care provider. °· Do not take baths, swim, or use a hot tub until your health care provider approves. Ask your health care provider if you may take showers. You may only be allowed to take sponge baths. °· Do not drive for 24 hours if you were given a sedative during your procedure. °· Wear a medical alert bracelet in case of an emergency. This will tell any health care providers that you have a port. °· Keep all follow-up visits as told by your health care provider. This is important. °Contact a health care provider if: °· You cannot flush your port with saline as directed, or you cannot draw blood from the port. °· You have a fever or chills. °· You have redness, swelling, or pain around your port insertion site. °· You have fluid or blood coming from your port insertion site. °· Your port insertion site feels warm to the touch. °· You have pus or a bad smell coming from the port insertion site. °Get help right away if: °· You have chest pain or shortness of breath. °· You have bleeding from your port that you cannot control. °Summary °· Take care of the port as told by your health   care provider. Keep the manufacturer's information card with you at all times.  Change your dressing as told by your health care provider.  Contact a health care provider if you have a fever or chills or if you have redness, swelling, or pain around your port insertion site.  Keep all follow-up visits as told by your health care provider. This information is not intended to replace advice given to you by your health care provider. Make sure you discuss any questions you have with your health care provider. Document Released: 08/28/2013 Document Revised: 06/05/2018 Document Reviewed:  06/05/2018 Elsevier Patient Education  [place] dispositivo de perfusin implantable, cuidados posteriores Implanted [place] Insertion, Care After [employer] brinda informacin sobre cmo cuidarse despus del procedimiento. El mdico tambin podr darle indicaciones ms especficas. Comunquese con el mdico si tiene problemas o preguntas. Qu puedo esperar despus del procedimiento? Despus del procedimiento, es comn tener los siguientes sntomas:  Chemical engineer de la insercin del dispositivo.  Moretones en la piel alrededor del dispositivo. Esto debera mejorar luego de 3 o 4 das. Siga estas indicaciones en su casa: Cuidado del dispositivo  Luego de que le coloquen el dispositivo, le darn una tarjeta de informacin del fabricante. La tarjeta contiene informacin acerca del dispositivo. Llvela siempre con usted.  Cuide el dispositivo como se lo haya indicado el mdico. Pregntele al mdico si usted o un familiar puede recibir capacitacin para cuidar del dispositivo en casa. Un enfermero a domicilio tambin puede cuidar del dispositivo.  Asegrese de recordar el tipo de dispositivo que tiene. Cuidados de las incisiones      Siga las indicaciones del mdico acerca de los cuidados del lugar de la insercin del dispositivo. Asegrese de hacer lo siguiente: ? Lvese las manos con agua y [place] antes y despus de cambiar la venda (vendaje). Use desinfectante para manos si no dispone de [place]. ? Cambie el vendaje como se lo haya indicado el mdico. ? No retire los puntos (suturas), la goma para cerrar la piel o las tiras [place]. Es posible que estos cierres cutneos Animal nutritionist en la piel durante 2semanas o ms tiempo. Si los bordes de las tiras adhesivas empiezan a despegarse y Therapist, sports, puede recortar los que estn sueltos. No retire las tiras [facility] por completo a menos que el mdico se lo indique.  Controle TEFL teacher de insercin  del dispositivo todos los das para detectar signos de infeccin. Est atento a los siguientes signos: ? Enrojecimiento, hinchazn o dolor. ? Lquido o sangre. ? Calor. ? Pus o mal olor. Actividad  Retome sus actividades normales como se lo haya indicado el mdico. Pregntele al mdico qu actividades son seguras para usted.  No levante ningn objeto que pese ms de 10libras (4.5kg) o que supere el lmite de peso que le hayan indicado, hasta que el mdico le diga que puede [place]. Indicaciones generales  [employer] de venta libre y los recetados solamente como se lo haya indicado el mdico.  No tome baos de inmersin, no nade ni use el jacuzzi hasta que el mdico lo autorice. Pregntele al mdico si puede ducharse. Thurston Pounds solo le permitan darse baos de [place].  No conduzca durante 24horas si le administraron un sedante durante el procedimiento.  Use un brazalete de alerta mdico en caso de emergencia. Esto permitir que cualquier mdico que lo atienda sepa que tiene un dispositivo.  Concurra a todas las visitas de [employer] se lo  haya indicado el mdico. Esto es importante. Comunquese con un mdico si:  No puede purgar el dispositivo con solucin salina como se le indic, o no puede extraer sangre del dispositivo.  Tiene fiebre o escalofros.  Tiene enrojecimiento, hinchazn o dolor alrededor del lugar de la insercin del dispositivo.  Le sale lquido o sangre del Environmental consultant de la insercin del dispositivo.  El lugar de la insercin del dispositivo est caliente al tacto.  Tiene pus o percibe mal olor que proviene del lugar de la insercin del dispositivo. Solicite ayuda inmediatamente si:  Siente falta de aire o dolor en el pecho.  Tiene hemorragia proveniente del lugar donde tiene el dispositivo y no puede controlarla. Resumen  Cuide el dispositivo como se lo haya indicado el mdico. Tenga la tarjeta informacin del fabricante con usted en todo  momento.  Cambie el vendaje como se lo haya indicado el mdico.  Comunquese con un mdico si tiene fiebre o escalofros o si tiene enrojecimiento, hinchazn o dolor alrededor del Environmental consultant de insercin del dispositivo.  Concurra a todas las visitas de seguimiento como se lo haya indicado el mdico. Esta informacin no tiene Marine scientist el consejo del mdico. Asegrese de hacerle al mdico cualquier pregunta que tenga. Document Released: 08/28/2013 Document Revised: 07/12/2018 Document Reviewed: 07/12/2018 Elsevier Patient Education  2020 [employer].

## 2019-08-27 ENCOUNTER — Ambulatory Visit (HOSPITAL_COMMUNITY): Payer: No Typology Code available for payment source | Admitting: Hematology

## 2019-08-27 ENCOUNTER — Encounter (HOSPITAL_COMMUNITY): Payer: Self-pay | Admitting: Hematology

## 2019-08-27 ENCOUNTER — Encounter (HOSPITAL_COMMUNITY): Payer: Self-pay | Admitting: General Practice

## 2019-08-27 ENCOUNTER — Inpatient Hospital Stay (HOSPITAL_BASED_OUTPATIENT_CLINIC_OR_DEPARTMENT_OTHER): Payer: Medicaid Other | Admitting: Hematology

## 2019-08-27 ENCOUNTER — Inpatient Hospital Stay (HOSPITAL_COMMUNITY): Payer: Medicaid Other

## 2019-08-27 ENCOUNTER — Other Ambulatory Visit (HOSPITAL_COMMUNITY): Payer: No Typology Code available for payment source

## 2019-08-27 ENCOUNTER — Inpatient Hospital Stay (HOSPITAL_COMMUNITY): Payer: Medicaid Other | Attending: Hematology | Admitting: General Practice

## 2019-08-27 ENCOUNTER — Ambulatory Visit (HOSPITAL_COMMUNITY): Payer: No Typology Code available for payment source

## 2019-08-27 VITALS — BP 126/86 | HR 76 | Temp 97.1°F | Resp 16 | Wt 156.6 lb

## 2019-08-27 VITALS — BP 110/54 | HR 75 | Temp 97.6°F | Resp 16

## 2019-08-27 DIAGNOSIS — Z79899 Other long term (current) drug therapy: Secondary | ICD-10-CM | POA: Diagnosis not present

## 2019-08-27 DIAGNOSIS — Z803 Family history of malignant neoplasm of breast: Secondary | ICD-10-CM | POA: Diagnosis not present

## 2019-08-27 DIAGNOSIS — C50411 Malignant neoplasm of upper-outer quadrant of right female breast: Secondary | ICD-10-CM

## 2019-08-27 DIAGNOSIS — Z1501 Genetic susceptibility to malignant neoplasm of breast: Secondary | ICD-10-CM | POA: Insufficient documentation

## 2019-08-27 DIAGNOSIS — Z170 Estrogen receptor positive status [ER+]: Secondary | ICD-10-CM | POA: Diagnosis not present

## 2019-08-27 DIAGNOSIS — Z7689 Persons encountering health services in other specified circumstances: Secondary | ICD-10-CM | POA: Insufficient documentation

## 2019-08-27 DIAGNOSIS — Z5111 Encounter for antineoplastic chemotherapy: Secondary | ICD-10-CM | POA: Diagnosis not present

## 2019-08-27 DIAGNOSIS — R197 Diarrhea, unspecified: Secondary | ICD-10-CM | POA: Insufficient documentation

## 2019-08-27 DIAGNOSIS — Z23 Encounter for immunization: Secondary | ICD-10-CM | POA: Diagnosis not present

## 2019-08-27 DIAGNOSIS — Z5112 Encounter for antineoplastic immunotherapy: Secondary | ICD-10-CM | POA: Diagnosis present

## 2019-08-27 DIAGNOSIS — M25521 Pain in right elbow: Secondary | ICD-10-CM | POA: Diagnosis not present

## 2019-08-27 DIAGNOSIS — C50911 Malignant neoplasm of unspecified site of right female breast: Secondary | ICD-10-CM

## 2019-08-27 LAB — CBC WITH DIFFERENTIAL/PLATELET
Abs Immature Granulocytes: 0.02 10*3/uL (ref 0.00–0.07)
Basophils Absolute: 0 10*3/uL (ref 0.0–0.1)
Basophils Relative: 1 %
Eosinophils Absolute: 0.1 10*3/uL (ref 0.0–0.5)
Eosinophils Relative: 2 %
HCT: 39.1 % (ref 36.0–46.0)
Hemoglobin: 11.9 g/dL — ABNORMAL LOW (ref 12.0–15.0)
Immature Granulocytes: 0 %
Lymphocytes Relative: 35 %
Lymphs Abs: 2.5 10*3/uL (ref 0.7–4.0)
MCH: 24 pg — ABNORMAL LOW (ref 26.0–34.0)
MCHC: 30.4 g/dL (ref 30.0–36.0)
MCV: 79 fL — ABNORMAL LOW (ref 80.0–100.0)
Monocytes Absolute: 0.4 10*3/uL (ref 0.1–1.0)
Monocytes Relative: 6 %
Neutro Abs: 3.9 10*3/uL (ref 1.7–7.7)
Neutrophils Relative %: 56 %
Platelets: 433 10*3/uL — ABNORMAL HIGH (ref 150–400)
RBC: 4.95 MIL/uL (ref 3.87–5.11)
RDW: 14.8 % (ref 11.5–15.5)
WBC: 7 10*3/uL (ref 4.0–10.5)
nRBC: 0 % (ref 0.0–0.2)

## 2019-08-27 LAB — COMPREHENSIVE METABOLIC PANEL
ALT: 19 U/L (ref 0–44)
AST: 18 U/L (ref 15–41)
Albumin: 3.7 g/dL (ref 3.5–5.0)
Alkaline Phosphatase: 59 U/L (ref 38–126)
Anion gap: 7 (ref 5–15)
BUN: 8 mg/dL (ref 6–20)
CO2: 23 mmol/L (ref 22–32)
Calcium: 9.4 mg/dL (ref 8.9–10.3)
Chloride: 107 mmol/L (ref 98–111)
Creatinine, Ser: 0.49 mg/dL (ref 0.44–1.00)
GFR calc Af Amer: >60 mL/min (ref >60)
GFR calc non Af Amer: >60 mL/min (ref >60)
Glucose, Bld: 111 mg/dL — ABNORMAL HIGH (ref 70–99)
Potassium: 3.4 mmol/L — ABNORMAL LOW (ref 3.5–5.1)
Sodium: 137 mmol/L (ref 135–145)
Total Bilirubin: 0.6 mg/dL (ref 0.3–1.2)
Total Protein: 7.5 g/dL (ref 6.5–8.1)

## 2019-08-27 MED ORDER — ACETAMINOPHEN 325 MG PO TABS
ORAL_TABLET | ORAL | Status: AC
  Filled 2019-08-27: qty 2

## 2019-08-27 MED ORDER — SODIUM CHLORIDE 0.9 % IV SOLN
Freq: Once | INTRAVENOUS | Status: AC
  Administered 2019-08-27: 13:00:00 via INTRAVENOUS
  Filled 2019-08-27: qty 5

## 2019-08-27 MED ORDER — PALONOSETRON HCL INJECTION 0.25 MG/5ML
0.2500 mg | Freq: Once | INTRAVENOUS | Status: AC
  Administered 2019-08-27: 14:00:00 0.25 mg via INTRAVENOUS
  Filled 2019-08-27: qty 5

## 2019-08-27 MED ORDER — SODIUM CHLORIDE 0.9 % IV SOLN
784.8000 mg | Freq: Once | INTRAVENOUS | Status: AC
  Administered 2019-08-27: 16:00:00 780 mg via INTRAVENOUS
  Filled 2019-08-27: qty 78

## 2019-08-27 MED ORDER — POTASSIUM CHLORIDE CRYS ER 20 MEQ PO TBCR: 20.0000 meq | EXTENDED_RELEASE_TABLET | Freq: Two times a day (BID) | ORAL | 2 refills | Status: DC

## 2019-08-27 MED ORDER — SODIUM CHLORIDE 0.9 % IV SOLN
75.0000 mg/m2 | Freq: Once | INTRAVENOUS | Status: AC
  Administered 2019-08-27: 130 mg via INTRAVENOUS
  Filled 2019-08-27: qty 13

## 2019-08-27 MED ORDER — SODIUM CHLORIDE 0.9 % IV SOLN
840.0000 mg | Freq: Once | INTRAVENOUS | Status: AC
  Administered 2019-08-27: 12:00:00 840 mg via INTRAVENOUS
  Filled 2019-08-27: qty 28

## 2019-08-27 MED ORDER — INFLUENZA VAC SPLIT QUAD 0.5 ML IM SUSY
0.5000 mL | PREFILLED_SYRINGE | Freq: Once | INTRAMUSCULAR | Status: AC
  Administered 2019-08-27: 16:00:00 0.5 mL via INTRAMUSCULAR
  Filled 2019-08-27: qty 0.5

## 2019-08-27 MED ORDER — ACETAMINOPHEN 325 MG PO TABS
650.0000 mg | ORAL_TABLET | Freq: Once | ORAL | Status: AC
  Administered 2019-08-27: 650 mg via ORAL

## 2019-08-27 MED ORDER — DIPHENHYDRAMINE HCL 25 MG PO CAPS
50.0000 mg | ORAL_CAPSULE | Freq: Once | ORAL | Status: AC
  Administered 2019-08-27: 50 mg via ORAL

## 2019-08-27 MED ORDER — SODIUM CHLORIDE 0.9% FLUSH
10.0000 mL | INTRAVENOUS | Status: DC | PRN
  Administered 2019-08-27: 08:00:00 10 mL
  Filled 2019-08-27: qty 10

## 2019-08-27 MED ORDER — HEPARIN SOD (PORK) LOCK FLUSH 100 UNIT/ML IV SOLN
500.0000 [IU] | Freq: Once | INTRAVENOUS | Status: AC | PRN
  Administered 2019-08-27: 500 [IU]

## 2019-08-27 MED ORDER — TRASTUZUMAB CHEMO 150 MG IV SOLR
600.0000 mg | Freq: Once | INTRAVENOUS | Status: AC
  Administered 2019-08-27: 10:00:00 600 mg via INTRAVENOUS
  Filled 2019-08-27: qty 28.57

## 2019-08-27 MED ORDER — DIPHENHYDRAMINE HCL 25 MG PO CAPS
ORAL_CAPSULE | ORAL | Status: AC
  Filled 2019-08-27: qty 2

## 2019-08-27 MED ORDER — SODIUM CHLORIDE 0.9 % IV SOLN
Freq: Once | INTRAVENOUS | Status: AC
  Administered 2019-08-27: 10:00:00 via INTRAVENOUS

## 2019-08-27 NOTE — Progress Notes (Signed)

## 2019-08-27 NOTE — Assessment & Plan Note (Signed)
1.  T2N0 right breast IDC, ER positive, HER-2 positive: - Lump felt in her right breast on 07/01/2019, mammogram on 07/23/2019 showed lobulated hypodense mass with no evidence of axillary adenopathy. -Ultrasound-guided biopsy on 08/02/2019 shows invasive mammary carcinoma, E-cadherin positive, HER-2 was negative by IHC, ER 70% positive, PR 0%, Ki-67 80%. - HER-2 by FISH positive. - MRI of the breast on 08/12/2019 showed 2.6 x 2.4 by 2.8 cm right breast enhancing mass in the upper outer quadrant of the right breast.  No abnormal lymph nodes. - PET CT scan on 08/15/2019 showed hypermetabolic right breast mass consistent with primary breast cancer.  Mild metabolic activity associated right axillary lymph node favored reactive.  Intense activity associated with soft tissue posterior to the uterus measuring approximately 3.2 x 2.9 cm, SUV max 12.8. - Her echocardiogram was normal.  We have recommended neoadjuvant chemotherapy with TCH P regimen. -We discussed the the chemotherapy schedule and side effects in detail. -She will proceed with her cycle 1 today.  We reviewed her labs.  She will use Imodium as needed along with Compazine. -She will be seen back in 1 week for follow-up with repeat blood counts.  2.  BRCA1 positivity: -She had myriad my risk panel in 2017 which showed BRCA1 heterozygous mutation positivity. - I had a prolonged discussion about the test results which increases her risk of breast cancer, ovarian cancer and pancreatic cancer. -I have recommended bilateral mastectomies and oophorectomies. - She had increased uptake behind the uterus with SUV 12.8, likely ovarian in origin. -We have talked to Dr. Denman George about the ovarian findings.  We were recommended to follow-up with ultrasound in 6 to 12 weeks.

## 2019-08-27 NOTE — Patient Instructions (Signed)
[facility] at [facility] Discharge Instructions  You were seen today by Dr. [name]. He went over your recent lab results. He will see you back in 1 week for labs and follow up.   Thank you for choosing [facility] at [facility] to provide your oncology and hematology care.  To afford each patient quality time with our provider, please arrive at least 15 minutes before your scheduled appointment time.   If you have a lab appointment with the Cancer Center please come in thru the  Main Entrance and check in at the main information desk  You need to re-schedule your appointment should you arrive 10 or more minutes late.  We strive to give you quality time with our providers, and arriving late affects you and other patients whose appointments are after yours.  Also, if you no show three or more times for appointments you may be dismissed from the clinic at the providers discretion.     Again, thank you for choosing [facility].  Our hope is that these requests will decrease the amount of time that you wait before being seen by our physicians.       _____________________________________________________________  Should you have questions after your visit to [facility], please contact our office at [phone] between the hours of 8:00 a.m. and 4:30 p.m.  Voicemails left after 4:00 p.m. will not be returned until the following business day.  For prescription refill requests, have your pharmacy contact our office and allow 72 hours.    Cancer Center Support Programs:   > Cancer Support Group  2nd Tuesday of the month 1pm-2pm, Journey Room    

## 2019-08-27 NOTE — Progress Notes (Signed)
[place] Initial Psychosocial Assessment Clinical Social Work  Clinical Social Work contacted by phone to assess psychosocial, emotional, mental health, and spiritual needs of the patient.   Barriers to care/review of distress screen:  - Transportation:  Do you anticipate any problems getting to appointments?  Do you have someone who can help run errands for you if you need it?  Husband will transport to all appointments, but needs to preserve his hours/job.  "He wants to be by my side" but she is concerned that "the bills will keep coming."   - Help at home:  What is your living situation (alone, family, other)?  If you are physically unable to care for yourself, who would you call on to help you?  Lives at home w husband and 4 children (7, 31, 7, 4).  Has mother and sisters nearby who can help if needed.  - Support system:  What does your support system look like?  Who would you call on if you needed some kind of practical help?  What if you needed someone to talk to for emotional support?  Sisters and husband are her major support system.  Mother had breast cancer and diabetes - mother cannot walk well - provides emotional support.   - Finances:  Are you concerned about finances (I know, we may not want to go there.Marland Kitchen).  Considering returning to work?  If not, applying for disability?  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  "It was a tough decision but the best thing for me."  Husband's income pays bills, her income was used to pay groceries.  Is not eligible for Food Stamps.  Went through [facility] [facility] for initial cancer assessment - may hae [place] M[place]icaid.  What is your understanding of where you are with your cancer? Its cause?  Your treatment plan and what happens next?  Found lump and got assessment next day through [facility] - aware of her status as BRCA-1 status and need to act quickly.  Is currently  in chemotherapy every 3 weeks, also has oral medications to take a home for side effects.  Will discuss plans for surgical control of cancer after finishing chemotherapy.  What are your worries for the future as you begin treatment for cancer?  Has been told there is a "spot" on ovary, is not sure what it is, will be closely monitored.  "I dont want this cancer to come back"  "Make sure I have food on my table and for my kids."  What are your hopes and priorities during your treatment? What is important to you? What are your goals for your care?  Taking care of her children, connecting with others, keeping busy.    What are you willing to sacrifice during your treatment?  Is finished w childbearing, is willing to have surgery to remove uterus if recommended.    What are you NOT willing to sacrifice during your treatment?  Being active and around people.  "I dont want to be sitting on the couch thinking about cancer."  Wants to focus on positive vs negative.  Wants to give 100% to her kids but is aware that "there will be some days that I will be tired."      CSW Summary:  Patient and family psychosocial functioning including strengths, limitations, and coping skills:  New diagnosis of breast cancer, found lump and  got medical care quickly due to maternal breast cancer and her knowledge of BRCA positive status.  Aware of need for quick action/treatment.  Aware that she will need mastectomy, undecided about bilateral mastectomy.  Wants hysterectomy as she is finished w childbearing and does not want the uncertainty of possible cancer spread.  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  Worried that she will not be able to do as much for her children as she has been able to in the past due to fatigue and side effects.  Significant support from husband and family - young children at home.  Likes to be active and  involved, wants to look for ways to continue to be engaged in life despite her cancer treatments.    Identifications of barriers to care:  Financial stress, food insecurity  Availability of community resources:  Refer to [employer], Duanne Limerick, [employer].  Provide information on [place] for limited financial assistance.  Encouraged participation in [employer] activities.  Clinical Social Worker follow up needed: Yes.  Call in 3 weeks to assess progress.

## 2019-08-27 NOTE — Progress Notes (Signed)
[place], [place] [id number]   CLINIC:  Medical Oncology/Hematology  PCP:  Soyla Dryer, PA-C [place] [id number] [phone]   REASON FOR VISIT:  Follow-up for right breast cancer.  CURRENT THERAPY: Under work-up.    INTERVAL HISTORY:  Ms. Kight 38 y.o. female seen for follow-up of right breast cancer.  She had port placed.  She had echocardiogram done.  She is accompanied by her husband today.  Appetite and energy levels are 75%.  Denies any fevers, night sweats or weight loss.  Denies any tingling or numbness next images.  Reports that she has taken off of work until end of the year.  No symptoms or signs of PND or orthopnea.    REVIEW OF SYSTEMS:  Review of Systems  All other systems reviewed and are negative.    PAST MEDICAL/SURGICAL HISTORY:  Past Medical History:  Diagnosis Date  . BRCA1 positive   . Family history of BRCA1 gene positive   . Family history of breast cancer   . H/O Bell's palsy 2000   Left sided  . HSV infection    Past Surgical History:  Procedure Laterality Date  . IR IMAGING GUIDED PORT INSERTION  08/26/2019  . TUBAL LIGATION       SOCIAL HISTORY:  Social History   Socioeconomic History  . Marital status: Married    Spouse name: [name]  . Number of children: 5  . Years of education: Not on file  . Highest education level: 12th grade  Occupational History  . Not on file  Social Needs  . Financial resource strain: Somewhat hard  . Food insecurity    Worry: Sometimes true    Inability: Sometimes true  . Transportation needs    Medical: No    Non-medical: No  Tobacco Use  . Smoking status: Never Smoker  . Smokeless tobacco: Never Used  Substance and Sexual Activity  . Alcohol use: No  . Drug use: No  . Sexual activity: Yes    Birth control/protection: Surgical  Lifestyle  . Physical activity    Days per week: 0 days    Minutes per session: 0 min  . Stress:  Only a little  Relationships  . Social connections    Talks on phone: More than three times a week    Gets together: More than three times a week    Attends religious service: More than 4 times per year    Active member of club or organization: Yes    Attends meetings of clubs or organizations: More than 4 times per year    Relationship status: Married  . Intimate partner violence    Fear of current or ex partner: No    Emotionally abused: No    Physically abused: No    Forced sexual activity: No  Other Topics Concern  . Not on file  Social History Narrative  . Not on file    FAMILY HISTORY:  Family History  Problem Relation Age of Onset  . Diabetes Mother   . Breast cancer Mother 17  . Cancer Mother   . Diabetes Maternal Grandmother   . Stomach cancer Maternal Grandfather   . Breast cancer Maternal Aunt        dx in her 59s  . Diabetes Maternal Aunt     CURRENT MEDICATIONS:  Outpatient Encounter Medications as of 08/27/2019  Medication Sig  . CARBOPLATIN IV Inject into the vein every  21 ( twenty-one) days.  . DOCEtaxel (TAXOTERE IV) Inject into the vein every 21 ( twenty-one) days.  Marland Kitchen lidocaine-prilocaine (EMLA) cream Apply a small amount to port a cath site and cover with plastic wrap one hour prior to chemotherapy appointments  . pertuzumab in sodium chloride 0.9 % 250 mL Inject into the vein every 21 ( twenty-one) days.  . potassium chloride SA (KLOR-CON) 20 MEQ tablet Take 1 tablet (20 mEq total) by mouth 2 (two) times daily.  . prochlorperazine (COMPAZINE) 10 MG tablet Take 1 tablet (10 mg total) by mouth every 6 (six) hours as needed for nausea or vomiting.  . trastuzumab-dkst 2 mg/kg in sodium chloride 0.9 % 250 mL Inject into the vein every 21 ( twenty-one) days.   Facility-Administered Encounter Medications as of 08/27/2019  Medication  . [DISCONTINUED] 0.9 %  sodium chloride infusion    ALLERGIES:  No Known Allergies   PHYSICAL EXAM:  ECOG Performance  status: 0  Vitals:   08/27/19 0813  BP: 126/86  Pulse: 76  Resp: 16  Temp: (!) 97.1 F (36.2 C)  SpO2: 100%   Filed Weights   08/27/19 0813  Weight: 156 lb 9.6 oz (71 kg)    Physical Exam Vitals signs reviewed.  Constitutional:      Appearance: Normal appearance.  Cardiovascular:     Rate and Rhythm: Normal rate and regular rhythm.     Heart sounds: Normal heart sounds.  Pulmonary:     Effort: Pulmonary effort is normal.     Breath sounds: Normal breath sounds.  Abdominal:     General: There is no distension.     Palpations: Abdomen is soft. There is no mass.  Musculoskeletal:        General: No swelling.  Skin:    General: Skin is warm.  Neurological:     General: No focal deficit present.     Mental Status: She is alert and oriented to person, place, and time.  Psychiatric:        Mood and Affect: Mood normal.        Behavior: Behavior normal.      LABORATORY DATA:  I have reviewed the labs as listed.  CBC    Component Value Date/Time   WBC 7.0 08/27/2019 0802   RBC 4.95 08/27/2019 0802   HGB 11.9 (L) 08/27/2019 0802   HCT 39.1 08/27/2019 0802   PLT 433 (H) 08/27/2019 0802   MCV 79.0 (L) 08/27/2019 0802   MCH 24.0 (L) 08/27/2019 0802   MCHC 30.4 08/27/2019 0802   RDW 14.8 08/27/2019 0802   LYMPHSABS 2.5 08/27/2019 0802   MONOABS 0.4 08/27/2019 0802   EOSABS 0.1 08/27/2019 0802   BASOSABS 0.0 08/27/2019 0802   CMP Latest Ref Rng & Units 08/27/2019 08/26/2019 09/11/2017  Glucose 70 - 99 mg/dL 111(H) 115(H) -  BUN 6 - 20 mg/dL 8 6 -  Creatinine 0.44 - 1.00 mg/dL 0.49 0.63 -  Sodium 135 - 145 mmol/L 137 138 -  Potassium 3.5 - 5.1 mmol/L 3.4(L) 3.1(L) -  Chloride 98 - 111 mmol/L 107 105 -  CO2 22 - 32 mmol/L 23 24 -  Calcium 8.9 - 10.3 mg/dL 9.4 9.4 -  Total Protein 6.5 - 8.1 g/dL 7.5 - 7.9  Total Bilirubin 0.3 - 1.2 mg/dL 0.6 - 0.9  Alkaline Phos 38 - 126 U/L 59 - 74  AST 15 - 41 U/L 18 - 21  ALT 0 - 44 U/L 19 - 23  DIAGNOSTIC IMAGING:   I have independently reviewed the scans and discussed with the patient.    ASSESSMENT & PLAN:   Infiltrating ductal carcinoma of upper-outer quadrant of right breast in female ([place]) 1.  T2N0 right breast IDC, ER positive, HER-2 positive: - Lump felt in her right breast on 07/01/2019, mammogram on 07/23/2019 showed lobulated hypodense mass with no evidence of axillary adenopathy. -Ultrasound-guided biopsy on 08/02/2019 shows invasive mammary carcinoma, E-cadherin positive, HER-2 was negative by IHC, ER 70% positive, PR 0%, Ki-67 80%. - HER-2 by FISH positive. - MRI of the breast on 08/12/2019 showed 2.6 x 2.4 by 2.8 cm right breast enhancing mass in the upper outer quadrant of the right breast.  No abnormal lymph nodes. - PET CT scan on 08/15/2019 showed hypermetabolic right breast mass consistent with primary breast cancer.  Mild metabolic activity associated right axillary lymph node favored reactive.  Intense activity associated with soft tissue posterior to the uterus measuring approximately 3.2 x 2.9 cm, SUV max 12.8. - Her echocardiogram was normal.  We have recommended neoadjuvant chemotherapy with TCH P regimen. -We discussed the the chemotherapy schedule and side effects in detail. -She will proceed with her cycle 1 today.  We reviewed her labs.  She will use Imodium as needed along with Compazine. -She will be seen back in 1 week for follow-up with repeat blood counts.  2.  BRCA1 positivity: -She had myriad my risk panel in 2017 which showed BRCA1 heterozygous mutation positivity. - I had a prolonged discussion about the test results which increases her risk of breast cancer, ovarian cancer and pancreatic cancer. -I have recommended bilateral mastectomies and oophorectomies. - She had increased uptake behind the uterus with SUV 12.8, likely ovarian in origin. -We have talked to Dr. Denman George about the ovarian findings.  We were recommended to follow-up with ultrasound in 6 to 12 weeks.    Total time spent is 40 minutes with more than 50% of the time spent face-to-face discussing treatment plan, counseling and coordination of care.  Orders placed this encounter:  Orders Placed This Encounter  Procedures  . DG ELBOW COMPLETE RIGHT (3+VIEW)  . CBC with Differential/Platelet  . Comprehensive metabolic panel  . Magnesium      Derek Jack, MD [place] [phone]

## 2019-08-27 NOTE — Progress Notes (Signed)
Labs reviewed with MD today at office visit. Proceed with day one of treatment per MD.  [id number]- patient complaining of feeling lightheaded, vitals stable , patient had benadryl for pre-med. Will continue to monitor.   Treatment given per orders. Patient tolerated it well without problems. Vitals stable and discharged home from clinic ambulatory. Follow up as scheduled.

## 2019-08-27 NOTE — Progress Notes (Signed)
[facility] CSW Progress Notes  Call to patient to complete social work assessment, no answer, left VM requesting call back. Noted patient is in infusion today - appt can be rescheduled if patient desires.  Edwyna Shell, LCSW Clinical Social Worker Phone:  [phone] Cell:  [phone]

## 2019-08-27 NOTE — Patient Instructions (Signed)
[facility] Discharge Instructions for Patients Receiving Chemotherapy  Today you received the following chemotherapy agents   To help prevent nausea and vomiting after your treatment, we encourage you to take your nausea medication   If you develop nausea and vomiting that is not controlled by your nausea medication, call the clinic.   BELOW ARE SYMPTOMS THAT SHOULD BE REPORTED IMMEDIATELY:  *FEVER GREATER THAN 100.5 F  *CHILLS WITH OR WITHOUT FEVER  NAUSEA AND VOMITING THAT IS NOT CONTROLLED WITH YOUR NAUSEA MEDICATION  *UNUSUAL SHORTNESS OF BREATH  *UNUSUAL BRUISING OR BLEEDING  TENDERNESS IN MOUTH AND THROAT WITH OR WITHOUT PRESENCE OF ULCERS  *URINARY PROBLEMS  *BOWEL PROBLEMS  UNUSUAL RASH Items with * indicate a potential emergency and should be followed up as soon as possible.  Feel free to call the clinic should you have any questions or concerns. The clinic phone number is [phone].  Please show the CHEMO ALERT CARD at check-in to the Emergency Department and triage nurse.   

## 2019-08-28 ENCOUNTER — Telehealth: Payer: Self-pay

## 2019-08-28 ENCOUNTER — Telehealth (HOSPITAL_COMMUNITY): Payer: Self-pay | Admitting: *Deleted

## 2019-08-28 NOTE — Telephone Encounter (Signed)
Pt called stating that she experienced bad cramping last night that got better after she had a BM, she stated that she had 2 more BMs and took 2 imodium and has not had a BM since. She stated that her legs are weak and wobbly. She states that her cheeks are red as well with no rash noted. She has taken the claritin and nausea medication as directed. I spoke with Dr. Raliegh Ip and Lala Lund and they advised pushing fluids, taking Benadryl if needed and to continue taking her medications as directed. I advised the pt of provider recommendations and she verbalized understanding. She was told to call us if anything changed.

## 2019-08-28 NOTE — Telephone Encounter (Signed)
Follow up from referral from [place] with [facility] for Food insecurity.  Client has been given information regarding [employer] by [employer]. Client states not eligible for food stamps due to husband's income.  She is not currently working and has 4 children in the household that attend a [employer] school on [employer]. Her main concern is to assure she has food for her family and especially her children along with food to send with them to school for  lunch.  She has a very supportive family and friends as well as a strong faith connection. Discussed with Mrs Shaqunda Bedoya [employer] market that is at [employer] every 4th Saturday from 11am until 1PM. The next market date will be 09/14/19 Also will be able to connect client to a food box this week with fresh fruits, vegetables, cheese, gallon of milk and usually two frozen meats. Client has family that will be able to pick that box up tomorrow at 10am.  Will continue to follow up with client for further needs for foods.  Plan to follow up next week with client.    Debria Garret RN  [facility] Program

## 2019-08-29 ENCOUNTER — Inpatient Hospital Stay (HOSPITAL_COMMUNITY): Payer: Medicaid Other

## 2019-08-29 ENCOUNTER — Ambulatory Visit (HOSPITAL_COMMUNITY): Payer: Medicaid Other

## 2019-08-29 ENCOUNTER — Other Ambulatory Visit: Payer: Self-pay

## 2019-08-29 ENCOUNTER — Encounter (HOSPITAL_COMMUNITY): Payer: Self-pay

## 2019-08-29 VITALS — BP 114/71 | HR 83 | Temp 97.7°F | Resp 16

## 2019-08-29 DIAGNOSIS — Z5111 Encounter for antineoplastic chemotherapy: Secondary | ICD-10-CM | POA: Diagnosis not present

## 2019-08-29 DIAGNOSIS — C50411 Malignant neoplasm of upper-outer quadrant of right female breast: Secondary | ICD-10-CM

## 2019-08-29 MED ORDER — PEGFILGRASTIM-JMDB 6 MG/0.6ML ~~LOC~~ SOSY
6.0000 mg | PREFILLED_SYRINGE | Freq: Once | SUBCUTANEOUS | Status: AC
  Administered 2019-08-29: 6 mg via SUBCUTANEOUS
  Filled 2019-08-29: qty 0.6

## 2019-08-29 NOTE — Progress Notes (Signed)
Patient tolerated injection with no complaints voiced.  Site clean and dry with no bruising or swelling noted at site.  Band aid applied.  Vss with discharge and left ambulatory with no s/s of distress noted.  

## 2019-09-03 ENCOUNTER — Other Ambulatory Visit: Payer: Self-pay

## 2019-09-03 ENCOUNTER — Inpatient Hospital Stay (HOSPITAL_COMMUNITY): Payer: Medicaid Other

## 2019-09-03 ENCOUNTER — Ambulatory Visit (HOSPITAL_COMMUNITY): Payer: Medicaid Other | Admitting: Hematology

## 2019-09-03 DIAGNOSIS — Z5111 Encounter for antineoplastic chemotherapy: Secondary | ICD-10-CM | POA: Diagnosis not present

## 2019-09-03 DIAGNOSIS — C50411 Malignant neoplasm of upper-outer quadrant of right female breast: Secondary | ICD-10-CM

## 2019-09-03 LAB — COMPREHENSIVE METABOLIC PANEL
ALT: 58 U/L — ABNORMAL HIGH (ref 0–44)
AST: 41 U/L (ref 15–41)
Albumin: 4.1 g/dL (ref 3.5–5.0)
Alkaline Phosphatase: 93 U/L (ref 38–126)
Anion gap: 15 (ref 5–15)
BUN: 10 mg/dL (ref 6–20)
CO2: 22 mmol/L (ref 22–32)
Calcium: 9.8 mg/dL (ref 8.9–10.3)
Chloride: 99 mmol/L (ref 98–111)
Creatinine, Ser: 0.97 mg/dL (ref 0.44–1.00)
GFR calc Af Amer: >60 mL/min (ref >60)
GFR calc non Af Amer: >60 mL/min (ref >60)
Glucose, Bld: 152 mg/dL — ABNORMAL HIGH (ref 70–99)
Potassium: 3.6 mmol/L (ref 3.5–5.1)
Sodium: 136 mmol/L (ref 135–145)
Total Bilirubin: 0.2 mg/dL — ABNORMAL LOW (ref 0.3–1.2)
Total Protein: 8.2 g/dL — ABNORMAL HIGH (ref 6.5–8.1)

## 2019-09-03 LAB — CBC WITH DIFFERENTIAL/PLATELET
Abs Immature Granulocytes: 2.16 10*3/uL — ABNORMAL HIGH (ref 0.00–0.07)
Basophils Absolute: 0 10*3/uL (ref 0.0–0.1)
Basophils Relative: 0 %
Eosinophils Absolute: 0 10*3/uL (ref 0.0–0.5)
Eosinophils Relative: 0 %
HCT: 42.3 % (ref 36.0–46.0)
Hemoglobin: 12.7 g/dL (ref 12.0–15.0)
Immature Granulocytes: 12 %
Lymphocytes Relative: 20 %
Lymphs Abs: 3.6 10*3/uL (ref 0.7–4.0)
MCH: 23.8 pg — ABNORMAL LOW (ref 26.0–34.0)
MCHC: 30 g/dL (ref 30.0–36.0)
MCV: 79.4 fL — ABNORMAL LOW (ref 80.0–100.0)
Monocytes Absolute: 3.2 10*3/uL — ABNORMAL HIGH (ref 0.1–1.0)
Monocytes Relative: 18 %
Neutro Abs: 9 10*3/uL — ABNORMAL HIGH (ref 1.7–7.7)
Neutrophils Relative %: 50 %
Platelets: 265 10*3/uL (ref 150–400)
RBC: 5.33 MIL/uL — ABNORMAL HIGH (ref 3.87–5.11)
RDW: 14.3 % (ref 11.5–15.5)
WBC: 18 10*3/uL — ABNORMAL HIGH (ref 4.0–10.5)
nRBC: 0 % (ref 0.0–0.2)

## 2019-09-03 LAB — MAGNESIUM: Magnesium: 2 mg/dL (ref 1.7–2.4)

## 2019-09-05 NOTE — Congregational Nurse Program (Signed)
  Dept: [phone]   Congregational Nurse Program Note  Date of Encounter: 09/05/2019  Past Medical History: Past Medical History:  Diagnosis Date  . BRCA1 positive   . Family history of BRCA1 gene positive   . Family history of breast cancer   . H/O Bell's palsy 2000   Left sided  . HSV infection     Encounter Details: CNP Questionnaire - 09/05/19 1315      Questionnaire   Patient Status  Immigrant    Race  Hispanic or Latino    Location Patient Served At  [facility]  Not Applicable    Uninsured  Uninsured (NEW 1x/quarter)    Food  Yes, have food insecurities    Housing/Utilities  Yes, have permanent housing    Transportation  No transportation needs    Interpersonal Safety  Yes, feel physically and emotionally safe where you currently live    Medication  No medication insecurities    Medical Provider  Yes    Referrals  Other;Area Agency    ED Visit Averted  Not Applicable    Life-Saving Intervention Made  Not Applicable      Family member picked up food box today. Mrs Hissong referred to [facility]/Clara Gunn from [place] center with food insecurity. Client unable to work due to cancer treatments. Has 4 young children in the home and not eligible for food stamps.  Will continue to follow for Food insecurity.   Debria Garret RN

## 2019-09-12 ENCOUNTER — Ambulatory Visit (HOSPITAL_COMMUNITY): Payer: Medicaid Other | Admitting: Hematology

## 2019-09-17 ENCOUNTER — Inpatient Hospital Stay (HOSPITAL_COMMUNITY): Payer: Medicaid Other

## 2019-09-17 ENCOUNTER — Other Ambulatory Visit: Payer: Self-pay

## 2019-09-17 ENCOUNTER — Ambulatory Visit (HOSPITAL_COMMUNITY): Payer: No Typology Code available for payment source

## 2019-09-17 ENCOUNTER — Ambulatory Visit (HOSPITAL_COMMUNITY): Payer: No Typology Code available for payment source | Admitting: Hematology

## 2019-09-17 ENCOUNTER — Inpatient Hospital Stay (HOSPITAL_BASED_OUTPATIENT_CLINIC_OR_DEPARTMENT_OTHER): Payer: Medicaid Other | Admitting: Hematology

## 2019-09-17 ENCOUNTER — Encounter (HOSPITAL_COMMUNITY): Payer: Self-pay | Admitting: Hematology

## 2019-09-17 ENCOUNTER — Other Ambulatory Visit (HOSPITAL_COMMUNITY): Payer: No Typology Code available for payment source

## 2019-09-17 VITALS — BP 132/73 | HR 93 | Temp 98.7°F | Resp 18 | Wt 155.8 lb

## 2019-09-17 VITALS — BP 110/65 | HR 75 | Temp 98.1°F | Resp 18

## 2019-09-17 DIAGNOSIS — C50411 Malignant neoplasm of upper-outer quadrant of right female breast: Secondary | ICD-10-CM | POA: Diagnosis not present

## 2019-09-17 DIAGNOSIS — Z170 Estrogen receptor positive status [ER+]: Secondary | ICD-10-CM

## 2019-09-17 DIAGNOSIS — C50911 Malignant neoplasm of unspecified site of right female breast: Secondary | ICD-10-CM

## 2019-09-17 DIAGNOSIS — Z5111 Encounter for antineoplastic chemotherapy: Secondary | ICD-10-CM | POA: Diagnosis not present

## 2019-09-17 LAB — CBC WITH DIFFERENTIAL/PLATELET
Abs Immature Granulocytes: 0.03 10*3/uL (ref 0.00–0.07)
Basophils Absolute: 0.1 10*3/uL (ref 0.0–0.1)
Basophils Relative: 1 %
Eosinophils Absolute: 0 10*3/uL (ref 0.0–0.5)
Eosinophils Relative: 0 %
HCT: 34.4 % — ABNORMAL LOW (ref 36.0–46.0)
Hemoglobin: 10.3 g/dL — ABNORMAL LOW (ref 12.0–15.0)
Immature Granulocytes: 1 %
Lymphocytes Relative: 35 %
Lymphs Abs: 1.9 10*3/uL (ref 0.7–4.0)
MCH: 24.3 pg — ABNORMAL LOW (ref 26.0–34.0)
MCHC: 29.9 g/dL — ABNORMAL LOW (ref 30.0–36.0)
MCV: 81.1 fL (ref 80.0–100.0)
Monocytes Absolute: 0.4 10*3/uL (ref 0.1–1.0)
Monocytes Relative: 8 %
Neutro Abs: 2.9 10*3/uL (ref 1.7–7.7)
Neutrophils Relative %: 55 %
Platelets: 519 10*3/uL — ABNORMAL HIGH (ref 150–400)
RBC: 4.24 MIL/uL (ref 3.87–5.11)
RDW: 16.8 % — ABNORMAL HIGH (ref 11.5–15.5)
WBC: 5.3 10*3/uL (ref 4.0–10.5)
nRBC: 0 % (ref 0.0–0.2)

## 2019-09-17 LAB — COMPREHENSIVE METABOLIC PANEL
ALT: 22 U/L (ref 0–44)
AST: 27 U/L (ref 15–41)
Albumin: 3.6 g/dL (ref 3.5–5.0)
Alkaline Phosphatase: 70 U/L (ref 38–126)
Anion gap: 11 (ref 5–15)
BUN: 10 mg/dL (ref 6–20)
CO2: 23 mmol/L (ref 22–32)
Calcium: 9.4 mg/dL (ref 8.9–10.3)
Chloride: 104 mmol/L (ref 98–111)
Creatinine, Ser: 0.65 mg/dL (ref 0.44–1.00)
GFR calc Af Amer: >60 mL/min (ref >60)
GFR calc non Af Amer: >60 mL/min (ref >60)
Glucose, Bld: 149 mg/dL — ABNORMAL HIGH (ref 70–99)
Potassium: 3.3 mmol/L — ABNORMAL LOW (ref 3.5–5.1)
Sodium: 138 mmol/L (ref 135–145)
Total Bilirubin: 0.7 mg/dL (ref 0.3–1.2)
Total Protein: 7.1 g/dL (ref 6.5–8.1)

## 2019-09-17 MED ORDER — ACETAMINOPHEN 325 MG PO TABS
650.0000 mg | ORAL_TABLET | Freq: Once | ORAL | Status: AC
  Administered 2019-09-17: 650 mg via ORAL
  Filled 2019-09-17: qty 2

## 2019-09-17 MED ORDER — SODIUM CHLORIDE 0.9 % IV SOLN
Freq: Once | INTRAVENOUS | Status: AC
  Administered 2019-09-17: 10:00:00 via INTRAVENOUS

## 2019-09-17 MED ORDER — SODIUM CHLORIDE 0.9% FLUSH
10.0000 mL | INTRAVENOUS | Status: DC | PRN
  Administered 2019-09-17: 10 mL
  Filled 2019-09-17: qty 10

## 2019-09-17 MED ORDER — SODIUM CHLORIDE 0.9 % IV SOLN
784.8000 mg | Freq: Once | INTRAVENOUS | Status: AC
  Administered 2019-09-17: 780 mg via INTRAVENOUS
  Filled 2019-09-17: qty 78

## 2019-09-17 MED ORDER — SODIUM CHLORIDE 0.9 % IV SOLN
75.0000 mg/m2 | Freq: Once | INTRAVENOUS | Status: AC
  Administered 2019-09-17: 130 mg via INTRAVENOUS
  Filled 2019-09-17: qty 13

## 2019-09-17 MED ORDER — DIPHENHYDRAMINE HCL 25 MG PO CAPS
25.0000 mg | ORAL_CAPSULE | Freq: Once | ORAL | Status: AC
  Administered 2019-09-17: 25 mg via ORAL
  Filled 2019-09-17: qty 1

## 2019-09-17 MED ORDER — TRASTUZUMAB-DKST CHEMO 150 MG IV SOLR
450.0000 mg | Freq: Once | INTRAVENOUS | Status: AC
  Administered 2019-09-17: 11:00:00 450 mg via INTRAVENOUS
  Filled 2019-09-17: qty 21.43

## 2019-09-17 MED ORDER — SODIUM CHLORIDE 0.9 % IV SOLN
Freq: Once | INTRAVENOUS | Status: AC
  Administered 2019-09-17: 10:00:00 via INTRAVENOUS
  Filled 2019-09-17: qty 5

## 2019-09-17 MED ORDER — PALONOSETRON HCL INJECTION 0.25 MG/5ML
0.2500 mg | Freq: Once | INTRAVENOUS | Status: AC
  Administered 2019-09-17: 0.25 mg via INTRAVENOUS
  Filled 2019-09-17: qty 5

## 2019-09-17 MED ORDER — SODIUM CHLORIDE 0.9 % IV SOLN
420.0000 mg | Freq: Once | INTRAVENOUS | Status: AC
  Administered 2019-09-17: 420 mg via INTRAVENOUS
  Filled 2019-09-17: qty 14

## 2019-09-17 MED ORDER — HEPARIN SOD (PORK) LOCK FLUSH 100 UNIT/ML IV SOLN
500.0000 [IU] | Freq: Once | INTRAVENOUS | Status: AC | PRN
  Administered 2019-09-17: 500 [IU]

## 2019-09-17 NOTE — Patient Instructions (Signed)
[place] at [facility] Discharge Instructions  You were seen today by Dr. Delton Coombes. He went over your recent lab results. He will see you back in 3 weeks for labs and follow up.  Start taking Claritin for 4 days.  Thank you for choosing [place] at [facility] to provide your oncology and hematology care.  To afford each patient quality time with our provider, please arrive at least 15 minutes before your scheduled appointment time.   If you have a lab appointment with the [place] please come in thru the  Main Entrance and check in at the main information desk  You need to re-schedule your appointment should you arrive 10 or more minutes late.  We strive to give you quality time with our providers, and arriving late affects you and other patients whose appointments are after yours.  Also, if you no show three or more times for appointments you may be dismissed from the clinic at the providers discretion.     Again, thank you for choosing [facility].  Our hope is that these requests will decrease the amount of time that you wait before being seen by our physicians.       _____________________________________________________________  Should you have questions after your visit to [facility], please contact our office at [phone] between the hours of 8:00 a.m. and 4:30 p.m.  Voicemails left after 4:00 p.m. will not be returned until the following business day.  For prescription refill requests, have your pharmacy contact our office and allow 72 hours.    Cancer Center Support Programs:   > Cancer Support Group  2nd Tuesday of the month 1pm-2pm, Journey Room

## 2019-09-17 NOTE — Progress Notes (Signed)
[id number] Labs reviewed with and pt seen by Dr. Delton Coombes and pt approved for chemo tx today per MD                            Monica Hancock tolerated chemo tx well without complaints or incident. VSS upon discharge. Pt discharged self ambulatory in satisfactory condition accompanied by her husband

## 2019-09-17 NOTE — Patient Instructions (Signed)
[facility] Discharge Instructions for Patients Receiving Chemotherapy   Beginning January 23rd 2017 lab work for the [facility] will be done in the  Main lab at [facility] on 1st floor. If you have a lab appointment with the [facility] please come in thru the  Main Entrance and check in at the main information desk   Today you received the following chemotherapy agents Herceptin,Perjeta,Taxotere and Carboplatin. Follow-up as scheduled. Call clinic for any questions or concerns  To help prevent nausea and vomiting after your treatment, we encourage you to take your nausea medication    If you develop nausea and vomiting, or diarrhea that is not controlled by your medication, call the clinic.  The clinic phone number is [phone]. Office hours are Monday-Friday 8:30am-5:00pm.  BELOW ARE SYMPTOMS THAT SHOULD BE REPORTED IMMEDIATELY:  *FEVER GREATER THAN 101.0 F  *CHILLS WITH OR WITHOUT FEVER  NAUSEA AND VOMITING THAT IS NOT CONTROLLED WITH YOUR NAUSEA MEDICATION  *UNUSUAL SHORTNESS OF BREATH  *UNUSUAL BRUISING OR BLEEDING  TENDERNESS IN MOUTH AND THROAT WITH OR WITHOUT PRESENCE OF ULCERS  *URINARY PROBLEMS  *BOWEL PROBLEMS  UNUSUAL RASH Items with * indicate a potential emergency and should be followed up as soon as possible. If you have an emergency after office hours please contact your primary care physician or go to the nearest emergency department.  Please call the clinic during office hours if you have any questions or concerns.   You may also contact the Patient Navigator at [phone] should you have any questions or need assistance in obtaining follow up care.      Resources For Cancer Patients and their Caregivers ? American Cancer Society: Can assist with transportation, wigs, general needs, runs Look Good Feel Better.        206-870-8137 ? Cancer Care: Provides financial assistance, online support groups, medication/co-pay  assistance.  1-800-813-HOPE 985-639-5568) ? [facility] Assists [place] cancer patients and their families through emotional , educational and financial support.  720-545-0632 ? [place] DSS Where to apply for food stamps, Medicaid and utility assistance. 607-365-6972 ? [employer]: Transportation to medical appointments. 317-162-8715 ? Social Security Administration: May apply for disability if have a Stage IV cancer. 219-779-2679 5391338198 ? [employer], Disability and Transit Services: Assists with nutrition, care and transit needs. [phone]

## 2019-09-18 ENCOUNTER — Telehealth: Payer: Self-pay

## 2019-09-18 ENCOUNTER — Encounter (HOSPITAL_COMMUNITY): Payer: Self-pay | Admitting: Hematology

## 2019-09-18 NOTE — Telephone Encounter (Signed)
Follow up from referral from [facility] for food assistance.  No answer, left voicemail regarding food box pickup tomorrow at 10 am.

## 2019-09-18 NOTE — Assessment & Plan Note (Signed)
1.  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 -Ultrasound-guided biopsy on 08/02/2019 shows invasive mammary carcinoma, E-cadherin positive, HER-2 negative by IHC, ER 70% positive, PR 0%, Ki-67 80%, HER-2 positive by FISH. -MRI of the breast on 08/12/2019 showed 2.6 x 2.4 x 2.8 cm right breast enhancing mass in the upper outer quadrant of the right breast with no abnormal lymph nodes. -PET scan on 08/15/2019 showed mild metabolic activity associated with right axillary node favored to be reactive.  Intense activity associated with soft tissue posterior to the uterus measuring approximately 3.2 x 2.9 cm, SUV 12.8. -Cycle 1 of TCHP on 08/27/2019. -She did experience some diarrhea which was controlled with Imodium.  She did report some itching and skin peeling on the fingers. -I reviewed her blood work.  She may proceed with cycle 2 without any dose modifications.  2.  BRCA1 positivity: -She had myriad my risk panel in 2017 which showed BRCA1 heterozygous mutation positivity. -We had a discussion about increased risk of breast cancer, ovarian cancer and pancreatic cancer. -I have recommended bilateral mastectomies and oophorectomies. -PET CT scan showed increased uptake behind the uterus with SUV of 12.8, likely ovarian in origin.  We have talked to Dr. Denman George about the ovarian findings.  It was recommended to follow-up with ultrasound in 6 to 12 weeks.

## 2019-09-18 NOTE — Progress Notes (Signed)
Monica Hancock, Monica Hancock [id number]   CLINIC:  Medical Oncology/Hematology  PCP:  No primary care provider on file. No primary provider on file. None   REASON FOR VISIT:  Follow-up for right breast cancer.  CURRENT THERAPY: Under work-up.    INTERVAL HISTORY:  Monica Hancock 38 y.o. female seen for follow-up and toxicity assessment prior to cycle 3 of chemotherapy.  She did experience diarrhea which lasted few days and was controlled with Imodium.  Denies any tingling or numbness next 20s.  Appetite and energy levels are 100%.  Denies any symptoms of PND or orthopnea.  No fevers or night sweats were reported.  No bleeding issues reported.  She did notice dry skin on the dorsal surface of the fingers with occasional peeling.  She is also washing her hands more frequently.    REVIEW OF SYSTEMS:  Review of Systems  Gastrointestinal: Positive for diarrhea.  All other systems reviewed and are negative.    PAST MEDICAL/SURGICAL HISTORY:  Past Medical History:  Diagnosis Date  . BRCA1 positive   . Family history of BRCA1 gene positive   . Family history of breast cancer   . H/O Bell's palsy 2000   Left sided  . HSV infection    Past Surgical History:  Procedure Laterality Date  . IR IMAGING GUIDED PORT INSERTION  08/26/2019  . TUBAL LIGATION       SOCIAL HISTORY:  Social History   Socioeconomic History  . Marital status: Married    Spouse name: Monica Hancock  . Number of children: 5  . Years of education: Not on file  . Highest education level: 12th grade  Occupational History  . Not on file  Social Needs  . Financial resource strain: Somewhat hard  . Food insecurity    Worry: Sometimes true    Inability: Sometimes true  . Transportation needs    Medical: No    Non-medical: No  Tobacco Use  . Smoking status: Never Smoker  . Smokeless tobacco: Never Used  Substance and Sexual Activity  . Alcohol use: No  . Drug use: No  . Sexual  activity: Yes    Birth control/protection: Surgical  Lifestyle  . Physical activity    Days per week: 0 days    Minutes per session: 0 min  . Stress: Only a little  Relationships  . Social connections    Talks on phone: More than three times a week    Gets together: More than three times a week    Attends religious service: More than 4 times per year    Active member of club or organization: Yes    Attends meetings of clubs or organizations: More than 4 times per year    Relationship status: Married  . Intimate partner violence    Fear of current or ex partner: No    Emotionally abused: No    Physically abused: No    Forced sexual activity: No  Other Topics Concern  . Not on file  Social History Narrative  . Not on file    FAMILY HISTORY:  Family History  Problem Relation Age of Onset  . Diabetes Mother   . Breast cancer Mother 49  . Cancer Mother   . Diabetes Maternal Grandmother   . Stomach cancer Maternal Grandfather   . Breast cancer Maternal Aunt        dx in her 69s  . Diabetes Maternal Aunt  CURRENT MEDICATIONS:  Outpatient Encounter Medications as of 09/17/2019  Medication Sig  . CARBOPLATIN IV Inject into the vein every 21 ( twenty-one) days.  . diphenhydrAMINE (BENADRYL) 50 MG tablet Take 50 mg by mouth daily.  . DOCEtaxel (TAXOTERE IV) Inject into the vein every 21 ( twenty-one) days.  . pertuzumab in sodium chloride 0.9 % 250 mL Inject into the vein every 21 ( twenty-one) days.  . potassium chloride SA (KLOR-CON) 20 MEQ tablet Take 1 tablet (20 mEq total) by mouth 2 (two) times daily.  . trastuzumab-dkst 2 mg/kg in sodium chloride 0.9 % 250 mL Inject into the vein every 21 ( twenty-one) days.  Marland Kitchen lidocaine-prilocaine (EMLA) cream Apply a small amount to port a cath site and cover with plastic wrap one hour prior to chemotherapy appointments (Patient not taking: Reported on 09/17/2019)  . prochlorperazine (COMPAZINE) 10 MG tablet Take 1 tablet (10 mg  total) by mouth every 6 (six) hours as needed for nausea or vomiting. (Patient not taking: Reported on 09/17/2019)   No facility-administered encounter medications on file as of 09/17/2019.     ALLERGIES:  No Known Allergies   PHYSICAL EXAM:  ECOG Performance status: 0  Vitals:   09/17/19 0827 09/17/19 0834  BP: 132/73 132/73  Pulse: 93 93  Resp: 18 18  Temp: 98.7 F (37.1 C) 98.7 F (37.1 C)  SpO2: 100% 100%   Filed Weights   09/17/19 0827 09/17/19 0834  Weight: 155 lb 12.8 oz (70.7 kg) 155 lb 12.8 oz (70.7 kg)    Physical Exam Vitals signs reviewed.  Constitutional:      Appearance: Normal appearance.  Cardiovascular:     Rate and Rhythm: Normal rate and regular rhythm.     Heart sounds: Normal heart sounds.  Pulmonary:     Effort: Pulmonary effort is normal.     Breath sounds: Normal breath sounds.  Abdominal:     General: There is no distension.     Palpations: Abdomen is soft. There is no mass.  Musculoskeletal:        General: No swelling.  Skin:    General: Skin is warm.  Neurological:     General: No focal deficit present.     Mental Status: She is alert and oriented to person, place, and time.  Psychiatric:        Mood and Affect: Mood normal.        Behavior: Behavior normal.      LABORATORY DATA:  I have reviewed the labs as listed.  CBC    Component Value Date/Time   WBC 5.3 09/17/2019 0829   RBC 4.24 09/17/2019 0829   HGB 10.3 (L) 09/17/2019 0829   HCT 34.4 (L) 09/17/2019 0829   PLT 519 (H) 09/17/2019 0829   MCV 81.1 09/17/2019 0829   MCH 24.3 (L) 09/17/2019 0829   MCHC 29.9 (L) 09/17/2019 0829   RDW 16.8 (H) 09/17/2019 0829   LYMPHSABS 1.9 09/17/2019 0829   MONOABS 0.4 09/17/2019 0829   EOSABS 0.0 09/17/2019 0829   BASOSABS 0.1 09/17/2019 0829   CMP Latest Ref Rng & Units 09/17/2019 09/03/2019 08/27/2019  Glucose 70 - 99 mg/dL 149(H) 152(H) 111(H)  BUN 6 - 20 mg/dL 10 10 8   Creatinine 0.44 - 1.00 mg/dL 0.65 0.97 0.49  Sodium  135 - 145 mmol/L 138 136 137  Potassium 3.5 - 5.1 mmol/L 3.3(L) 3.6 3.4(L)  Chloride 98 - 111 mmol/L 104 99 107  CO2 22 - 32 mmol/L 23 22  23  Calcium 8.9 - 10.3 mg/dL 9.4 9.8 9.4  Total Protein 6.5 - 8.1 g/dL 7.1 8.2(H) 7.5  Total Bilirubin 0.3 - 1.2 mg/dL 0.7 0.2(L) 0.6  Alkaline Phos 38 - 126 U/L 70 93 59  AST 15 - 41 U/L 27 41 18  ALT 0 - 44 U/L 22 58(H) 19       DIAGNOSTIC IMAGING:  I have independently reviewed the scans and discussed with the patient.    ASSESSMENT & PLAN:   Infiltrating ductal carcinoma of upper-outer quadrant of right breast in female ([place]) 1.  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 -Ultrasound-guided biopsy on 08/02/2019 shows invasive mammary carcinoma, E-cadherin positive, HER-2 negative by IHC, ER 70% positive, PR 0%, Ki-67 80%, HER-2 positive by FISH. -MRI of the breast on 08/12/2019 showed 2.6 x 2.4 x 2.8 cm right breast enhancing mass in the upper outer quadrant of the right breast with no abnormal lymph nodes. -PET scan on 08/15/2019 showed mild metabolic activity associated with right axillary node favored to be reactive.  Intense activity associated with soft tissue posterior to the uterus measuring approximately 3.2 x 2.9 cm, SUV 12.8. -Cycle 1 of TCHP on 08/27/2019. -She did experience some diarrhea which was controlled with Imodium.  She did report some itching and skin peeling on the fingers. -I reviewed her blood work.  She may proceed with cycle 2 without any dose modifications.  2.  BRCA1 positivity: -She had myriad my risk panel in 2017 which showed BRCA1 heterozygous mutation positivity. -We had a discussion about increased risk of breast cancer, ovarian cancer and pancreatic cancer. -I have recommended bilateral mastectomies and oophorectomies. -PET CT scan showed increased uptake behind the  uterus with SUV of 12.8, likely ovarian in origin.  We have talked to Dr. Denman George about the ovarian findings.  It was recommended to follow-up with ultrasound in 6 to 12 weeks.  Total time spent is 25 minutes with more than 50% of the time spent face-to-face discussing treatment plan, counseling and coordination of care. Orders placed this encounter:  Orders Placed This Encounter  Procedures  . CBC with Differential/Platelet  . Comprehensive metabolic panel      Derek Jack, MD [place] [phone]

## 2019-09-19 ENCOUNTER — Ambulatory Visit (HOSPITAL_COMMUNITY): Payer: Medicaid Other

## 2019-09-19 ENCOUNTER — Encounter (HOSPITAL_COMMUNITY): Payer: Self-pay

## 2019-09-19 ENCOUNTER — Ambulatory Visit: Payer: Medicaid Other | Admitting: Physician Assistant

## 2019-09-19 ENCOUNTER — Other Ambulatory Visit: Payer: Self-pay

## 2019-09-19 ENCOUNTER — Inpatient Hospital Stay (HOSPITAL_COMMUNITY): Payer: Medicaid Other

## 2019-09-19 VITALS — BP 115/70 | HR 67 | Temp 97.1°F | Resp 18

## 2019-09-19 DIAGNOSIS — Z5111 Encounter for antineoplastic chemotherapy: Secondary | ICD-10-CM | POA: Diagnosis not present

## 2019-09-19 DIAGNOSIS — C50411 Malignant neoplasm of upper-outer quadrant of right female breast: Secondary | ICD-10-CM

## 2019-09-19 MED ORDER — PEGFILGRASTIM-JMDB 6 MG/0.6ML ~~LOC~~ SOSY
PREFILLED_SYRINGE | SUBCUTANEOUS | Status: AC
  Filled 2019-09-19: qty 0.6

## 2019-09-19 MED ORDER — PEGFILGRASTIM-JMDB 6 MG/0.6ML ~~LOC~~ SOSY
6.0000 mg | PREFILLED_SYRINGE | Freq: Once | SUBCUTANEOUS | Status: AC
  Administered 2019-09-19: 6 mg via SUBCUTANEOUS

## 2019-09-19 NOTE — Progress Notes (Signed)
Patient tolerated injection with no complaints voiced.  Site clean and dry with no bruising or swelling noted at site.  Band aid applied.  Vss with discharge and left ambulatory with no s/s of distress noted.  

## 2019-10-04 ENCOUNTER — Telehealth: Payer: Self-pay

## 2019-10-04 NOTE — Telephone Encounter (Signed)
Called for follow up as a referral from [facility] for food insecurity. Client reports she will start back on Chemo next week and has been unable to cook. There are 3 children in the household.  Client notified food boxes will start back next Tuesday as she has received before with undetermined length of time.  Also discussed a [employer] program with 8 weeks of prepared meals (10 meals per week) that should begin next Thursday. Client is interested in also receiving those meals and expresses thanks for the assistance.  Will plan to call her next week as a reminder.

## 2019-10-08 ENCOUNTER — Inpatient Hospital Stay (HOSPITAL_COMMUNITY): Payer: Medicaid Other

## 2019-10-08 ENCOUNTER — Other Ambulatory Visit: Payer: Self-pay

## 2019-10-08 ENCOUNTER — Inpatient Hospital Stay (HOSPITAL_BASED_OUTPATIENT_CLINIC_OR_DEPARTMENT_OTHER): Payer: Medicaid Other | Admitting: Hematology

## 2019-10-08 ENCOUNTER — Inpatient Hospital Stay (HOSPITAL_COMMUNITY): Payer: Medicaid Other | Attending: Hematology

## 2019-10-08 ENCOUNTER — Encounter (HOSPITAL_COMMUNITY): Payer: Self-pay | Admitting: Hematology

## 2019-10-08 VITALS — BP 110/68 | HR 76 | Temp 97.6°F | Resp 18

## 2019-10-08 VITALS — BP 113/80 | HR 94 | Temp 97.1°F | Resp 18 | Wt 151.8 lb

## 2019-10-08 DIAGNOSIS — Z800 Family history of malignant neoplasm of digestive organs: Secondary | ICD-10-CM | POA: Insufficient documentation

## 2019-10-08 DIAGNOSIS — Z5111 Encounter for antineoplastic chemotherapy: Secondary | ICD-10-CM | POA: Insufficient documentation

## 2019-10-08 DIAGNOSIS — Z5189 Encounter for other specified aftercare: Secondary | ICD-10-CM | POA: Insufficient documentation

## 2019-10-08 DIAGNOSIS — Z148 Genetic carrier of other disease: Secondary | ICD-10-CM | POA: Insufficient documentation

## 2019-10-08 DIAGNOSIS — Z79899 Other long term (current) drug therapy: Secondary | ICD-10-CM | POA: Insufficient documentation

## 2019-10-08 DIAGNOSIS — C50411 Malignant neoplasm of upper-outer quadrant of right female breast: Secondary | ICD-10-CM

## 2019-10-08 DIAGNOSIS — Z170 Estrogen receptor positive status [ER+]: Secondary | ICD-10-CM | POA: Insufficient documentation

## 2019-10-08 DIAGNOSIS — Z5112 Encounter for antineoplastic immunotherapy: Secondary | ICD-10-CM | POA: Diagnosis present

## 2019-10-08 DIAGNOSIS — Z803 Family history of malignant neoplasm of breast: Secondary | ICD-10-CM | POA: Insufficient documentation

## 2019-10-08 DIAGNOSIS — E876 Hypokalemia: Secondary | ICD-10-CM | POA: Insufficient documentation

## 2019-10-08 LAB — COMPREHENSIVE METABOLIC PANEL
ALT: 31 U/L (ref 0–44)
AST: 26 U/L (ref 15–41)
Albumin: 4 g/dL (ref 3.5–5.0)
Alkaline Phosphatase: 79 U/L (ref 38–126)
Anion gap: 10 (ref 5–15)
BUN: 10 mg/dL (ref 6–20)
CO2: 23 mmol/L (ref 22–32)
Calcium: 9.7 mg/dL (ref 8.9–10.3)
Chloride: 107 mmol/L (ref 98–111)
Creatinine, Ser: 0.64 mg/dL (ref 0.44–1.00)
GFR calc Af Amer: >60 mL/min (ref >60)
GFR calc non Af Amer: >60 mL/min (ref >60)
Glucose, Bld: 130 mg/dL — ABNORMAL HIGH (ref 70–99)
Potassium: 3.4 mmol/L — ABNORMAL LOW (ref 3.5–5.1)
Sodium: 140 mmol/L (ref 135–145)
Total Bilirubin: 0.7 mg/dL (ref 0.3–1.2)
Total Protein: 7.4 g/dL (ref 6.5–8.1)

## 2019-10-08 LAB — CBC WITH DIFFERENTIAL/PLATELET
Abs Immature Granulocytes: 0.01 10*3/uL (ref 0.00–0.07)
Basophils Absolute: 0 10*3/uL (ref 0.0–0.1)
Basophils Relative: 0 %
Eosinophils Absolute: 0 10*3/uL (ref 0.0–0.5)
Eosinophils Relative: 0 %
HCT: 34.8 % — ABNORMAL LOW (ref 36.0–46.0)
Hemoglobin: 10.7 g/dL — ABNORMAL LOW (ref 12.0–15.0)
Immature Granulocytes: 0 %
Lymphocytes Relative: 35 %
Lymphs Abs: 1.9 10*3/uL (ref 0.7–4.0)
MCH: 25.9 pg — ABNORMAL LOW (ref 26.0–34.0)
MCHC: 30.7 g/dL (ref 30.0–36.0)
MCV: 84.3 fL (ref 80.0–100.0)
Monocytes Absolute: 0.5 10*3/uL (ref 0.1–1.0)
Monocytes Relative: 8 %
Neutro Abs: 3 10*3/uL (ref 1.7–7.7)
Neutrophils Relative %: 57 %
Platelets: 377 10*3/uL (ref 150–400)
RBC: 4.13 MIL/uL (ref 3.87–5.11)
RDW: 21.1 % — ABNORMAL HIGH (ref 11.5–15.5)
WBC: 5.5 10*3/uL (ref 4.0–10.5)
nRBC: 0 % (ref 0.0–0.2)

## 2019-10-08 MED ORDER — ACETAMINOPHEN 325 MG PO TABS
650.0000 mg | ORAL_TABLET | Freq: Once | ORAL | Status: AC
  Administered 2019-10-08: 10:00:00 650 mg via ORAL
  Filled 2019-10-08: qty 2

## 2019-10-08 MED ORDER — HEPARIN SOD (PORK) LOCK FLUSH 100 UNIT/ML IV SOLN
500.0000 [IU] | Freq: Once | INTRAVENOUS | Status: AC | PRN
  Administered 2019-10-08: 15:00:00 500 [IU]

## 2019-10-08 MED ORDER — DIPHENHYDRAMINE HCL 25 MG PO CAPS
50.0000 mg | ORAL_CAPSULE | Freq: Once | ORAL | Status: DC
  Filled 2019-10-08: qty 2

## 2019-10-08 MED ORDER — SODIUM CHLORIDE 0.9% FLUSH
10.0000 mL | INTRAVENOUS | Status: DC | PRN
  Administered 2019-10-08: 10 mL
  Filled 2019-10-08: qty 10

## 2019-10-08 MED ORDER — SODIUM CHLORIDE 0.9 % IV SOLN
Freq: Once | INTRAVENOUS | Status: AC
  Administered 2019-10-08: 10:00:00 via INTRAVENOUS

## 2019-10-08 MED ORDER — PALONOSETRON HCL INJECTION 0.25 MG/5ML
0.2500 mg | Freq: Once | INTRAVENOUS | Status: AC
  Administered 2019-10-08: 10:00:00 0.25 mg via INTRAVENOUS

## 2019-10-08 MED ORDER — DIPHENHYDRAMINE HCL 25 MG PO CAPS
25.0000 mg | ORAL_CAPSULE | Freq: Once | ORAL | Status: AC
  Administered 2019-10-08: 25 mg via ORAL

## 2019-10-08 MED ORDER — TRASTUZUMAB-DKST CHEMO 150 MG IV SOLR
450.0000 mg | Freq: Once | INTRAVENOUS | Status: AC
  Administered 2019-10-08: 11:00:00 450 mg via INTRAVENOUS
  Filled 2019-10-08: qty 21.43

## 2019-10-08 MED ORDER — SODIUM CHLORIDE 0.9 % IV SOLN
75.0000 mg/m2 | Freq: Once | INTRAVENOUS | Status: AC
  Administered 2019-10-08: 13:00:00 130 mg via INTRAVENOUS
  Filled 2019-10-08: qty 13

## 2019-10-08 MED ORDER — PALONOSETRON HCL INJECTION 0.25 MG/5ML
INTRAVENOUS | Status: AC
  Filled 2019-10-08: qty 5

## 2019-10-08 MED ORDER — SODIUM CHLORIDE 0.9 % IV SOLN
Freq: Once | INTRAVENOUS | Status: AC
  Administered 2019-10-08: 10:00:00 via INTRAVENOUS
  Filled 2019-10-08: qty 5

## 2019-10-08 MED ORDER — SODIUM CHLORIDE 0.9 % IV SOLN
420.0000 mg | Freq: Once | INTRAVENOUS | Status: AC
  Administered 2019-10-08: 12:00:00 420 mg via INTRAVENOUS
  Filled 2019-10-08: qty 14

## 2019-10-08 MED ORDER — SODIUM CHLORIDE 0.9 % IV SOLN
784.8000 mg | Freq: Once | INTRAVENOUS | Status: AC
  Administered 2019-10-08: 14:00:00 780 mg via INTRAVENOUS
  Filled 2019-10-08: qty 78

## 2019-10-08 NOTE — Patient Instructions (Addendum)
[place] at [facility] Discharge Instructions  You were seen today by Dr. Delton Coombes. He went over your recent lab results. Wear gloves when washing dishes to protect your hands. He will see you back in 3 weeks for labs and follow up.   Thank you for choosing [place] at [facility] to provide your oncology and hematology care.  To afford each patient quality time with our provider, please arrive at least 15 minutes before your scheduled appointment time.   If you have a lab appointment with the [facility] please come in thru the  Main Entrance and check in at the main information desk  You need to re-schedule your appointment should you arrive 10 or more minutes late.  We strive to give you quality time with our providers, and arriving late affects you and other patients whose appointments are after yours.  Also, if you no show three or more times for appointments you may be dismissed from the clinic at the providers discretion.     Again, thank you for choosing [facility].  Our hope is that these requests will decrease the amount of time that you wait before being seen by our physicians.       _____________________________________________________________  Should you have questions after your visit to [facility], please contact our office at [phone] between the hours of 8:00 a.m. and 4:30 p.m.  Voicemails left after 4:00 p.m. will not be returned until the following business day.  For prescription refill requests, have your pharmacy contact our office and allow 72 hours.    Cancer Center Support Programs:   > Cancer Support Group  2nd Tuesday of the month 1pm-2pm, Journey Room

## 2019-10-08 NOTE — Assessment & Plan Note (Signed)
1.  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 -US guided biopsy on 08/02/2019 shows invasive mammary carcinoma, E-cadherin positive, HER-2 negative by IHC, ER 70% positive, PR 0%, Ki-67 80%, HER-2 positive by FISH. -MRI of the breast on 08/12/2019 showed 2.6 x 2.4 x 2.8 cm right breast enhancing mass in the upper outer quadrant of the right breast with no abnormal lymph nodes. -PET scan on 08/15/2019 showed mild metabolic activity associated with right axillary node favored to be reactive.  Intense activity associated with soft tissue posterior to the uterus measuring approximately 3.2 x 2.9 cm, SUV 12.8. -2 cycles of neoadjuvant TCHP on 08/27/2019 and 09/17/2019. -She had experienced diarrhea which was controlled with Imodium.  Diarrhea lasted about 1 week.  She also experienced abdominal pain after the start of diarrhea each time.  She was also menstruating at that time. -We reviewed her labs today.  She will proceed with cycle 3 without any dose modifications. -We will reevaluate her in 3 weeks for follow-up.  2.  Hypokalemia: -Potassium today is 3.4.  She will continue potassium 20 mEq daily.  3.  BRCA1 positivity: -She had myRisk myriad panel in 2017 which showed BRCA1 heterozygous mutation positivity. -Increased risk of breast cancer, ovarian and pancreatic cancer was discussed.  I have recommended bilateral mastectomies and nephrectomies. -PET scan showed increased uptake behind the uterus with SUV 12.8, likely ovarian origin.  We talked with Dr. Denman George about the ovarian findings.  It was recommended to follow-up with ultrasound in 6 to 12 weeks.

## 2019-10-08 NOTE — Patient Instructions (Signed)
[facility] Discharge Instructions for Patients Receiving Chemotherapy   Beginning January 23rd 2017 lab work for the [facility] will be done in the  Main lab at [facility] on 1st floor. If you have a lab appointment with the [facility] please come in thru the  Main Entrance and check in at the main information desk   Today you received the following chemotherapy agents Herceptin,Perjeta,Taxotere and Carboplatin. Follow-up as scheduled. Call clinic for any questions or concerns  To help prevent nausea and vomiting after your treatment, we encourage you to take your nausea medication   If you develop nausea and vomiting, or diarrhea that is not controlled by your medication, call the clinic.  The clinic phone number is [phone]. Office hours are Monday-Friday 8:30am-5:00pm.  BELOW ARE SYMPTOMS THAT SHOULD BE REPORTED IMMEDIATELY:  *FEVER GREATER THAN 101.0 F  *CHILLS WITH OR WITHOUT FEVER  NAUSEA AND VOMITING THAT IS NOT CONTROLLED WITH YOUR NAUSEA MEDICATION  *UNUSUAL SHORTNESS OF BREATH  *UNUSUAL BRUISING OR BLEEDING  TENDERNESS IN MOUTH AND THROAT WITH OR WITHOUT PRESENCE OF ULCERS  *URINARY PROBLEMS  *BOWEL PROBLEMS  UNUSUAL RASH Items with * indicate a potential emergency and should be followed up as soon as possible. If you have an emergency after office hours please contact your primary care physician or go to the nearest emergency department.  Please call the clinic during office hours if you have any questions or concerns.   You may also contact the Patient Navigator at [phone] should you have any questions or need assistance in obtaining follow up care.      Resources For Cancer Patients and their Caregivers ? American Cancer Society: Can assist with transportation, wigs, general needs, runs Look Good Feel Better.        250-747-1463 ? Cancer Care: Provides financial assistance, online support groups, medication/co-pay  assistance.  1-800-813-HOPE (251) 262-1945) ? [facility] Assists [place] cancer patients and their families through emotional , educational and financial support.  ([phone] ? [place] DSS Where to apply for food stamps, Medicaid and utility assistance. 725-410-0513 ? [employer]: Transportation to medical appointments. 801-597-6056 ? Social Security Administration: May apply for disability if have a Stage IV cancer. [phone] ? [employer], Disability and Transit Services: Assists with nutrition, care and transit needs. 573-861-1996

## 2019-10-08 NOTE — Progress Notes (Signed)
[id number] Labs reviewed with and pt seen by Dr. Delton Coombes and pt approved for [facility] chemo tx today per MD                 Monica Hancock tolerated chemo tx well without complaints or incident. VSS upon discharge. Pt discharged self ambulatory in satisfactory condition

## 2019-10-08 NOTE — Patient Instructions (Signed)
[facility] at [facility] Discharge Instructions  Labs drawn from portacath today   Thank you for choosing [facility] at [facility] to provide your oncology and hematology care.  To afford each patient quality time with our provider, please arrive at least 15 minutes before your scheduled appointment time.   If you have a lab appointment with the Cancer Center please come in thru the Main Entrance and check in at the main information desk.  You need to re-schedule your appointment should you arrive 10 or more minutes late.  We strive to give you quality time with our providers, and arriving late affects you and other patients whose appointments are after yours.  Also, if you no show three or more times for appointments you may be dismissed from the clinic at the providers discretion.     Again, thank you for choosing [facility].  Our hope is that these requests will decrease the amount of time that you wait before being seen by our physicians.       _____________________________________________________________  Should you have questions after your visit to [facility], please contact our office at [phone] between the hours of 8:00 a.m. and 4:30 p.m.  Voicemails left after 4:00 p.m. will not be returned until the following business day.  For prescription refill requests, have your pharmacy contact our office and allow 72 hours.    Due to Covid, you will need to wear a mask upon entering the hospital. If you do not have a mask, a mask will be given to you at the Main Entrance upon arrival. For doctor visits, patients may have 1 support person with them. For treatment visits, patients can not have anyone with them due to social distancing guidelines and our immunocompromised population.     

## 2019-10-08 NOTE — Progress Notes (Signed)
[place], [place] [id number]   CLINIC:  Medical Oncology/Hematology  PCP:  No primary care provider on file. No primary provider on file. None   REASON FOR VISIT:  HER-2 positive right breast cancer  CURRENT THERAPY: Neoadjuvant TCHP    INTERVAL HISTORY:  [name] 38 y.o. female seen for follow-up of cycle 3 of chemotherapy and toxicity assessment.  She received cycle 2 on 09/17/2019.  She had experienced diarrhea which lasted about 1 week.  Diarrhea was controlled with Imodium.  She took anywhere between 2 to 5 tablets a day.  She also reported abdominal pain for the first 5 days.  She was menstruating at that time.  Abdominal pain lasted anywhere between 1 hour and whole day.  Mild fatigue reported.  No tingling or numbness in the extremities.  No fevers, night sweats or weight loss.  No infections or hospitalizations.    REVIEW OF SYSTEMS:  Review of Systems  Constitutional: Positive for fatigue.  Gastrointestinal: Positive for diarrhea.  All other systems reviewed and are negative.    PAST MEDICAL/SURGICAL HISTORY:  Past Medical History:  Diagnosis Date  . BRCA1 positive   . Family history of BRCA1 gene positive   . Family history of breast cancer   . H/O Bell's palsy 2000   Left sided  . HSV infection    Past Surgical History:  Procedure Laterality Date  . IR IMAGING GUIDED PORT INSERTION  08/26/2019  . TUBAL LIGATION       SOCIAL HISTORY:  Social History   Socioeconomic History  . Marital status: Married    Spouse name: Monica [place]  . Number of children: 5  . Years of education: Not on file  . Highest education level: 12th grade  Occupational History  . Not on file  Social Needs  . Financial resource strain: Somewhat hard  . Food insecurity    Worry: Sometimes true    Inability: Sometimes true  . Transportation needs    Medical: No    Non-medical: No  Tobacco Use  . Smoking status: Never Smoker  . Smokeless  tobacco: Never Used  Substance and Sexual Activity  . Alcohol use: No  . Drug use: No  . Sexual activity: Yes    Birth control/protection: Surgical  Lifestyle  . Physical activity    Days per week: 0 days    Minutes per session: 0 min  . Stress: Only a little  Relationships  . Social connections    Talks on phone: More than three times a week    Gets together: More than three times a week    Attends religious service: More than 4 times per year    Active member of club or organization: Yes    Attends meetings of clubs or organizations: More than 4 times per year    Relationship status: Married  . Intimate partner violence    Fear of current or ex partner: No    Emotionally abused: No    Physically abused: No    Forced sexual activity: No  Other Topics Concern  . Not on file  Social History Narrative  . Not on file    FAMILY HISTORY:  Family History  Problem Relation Age of Onset  . Diabetes Mother   . Breast cancer Mother 45  . Cancer Mother   . Diabetes Maternal Grandmother   . Stomach cancer Maternal Grandfather   . Breast cancer Maternal Aunt  dx in her 14s  . Diabetes Maternal Aunt     CURRENT MEDICATIONS:  Outpatient Encounter Medications as of 10/08/2019  Medication Sig  . CARBOPLATIN IV Inject into the vein every 21 ( twenty-one) days.  . DOCEtaxel (TAXOTERE IV) Inject into the vein every 21 ( twenty-one) days.  . pertuzumab in sodium chloride 0.9 % 250 mL Inject into the vein every 21 ( twenty-one) days.  . potassium chloride SA (KLOR-CON) 20 MEQ tablet Take 1 tablet (20 mEq total) by mouth 2 (two) times daily.  . trastuzumab-dkst 2 mg/kg in sodium chloride 0.9 % 250 mL Inject into the vein every 21 ( twenty-one) days.  . diphenhydrAMINE (BENADRYL) 50 MG tablet Take 50 mg by mouth daily.  Marland Kitchen lidocaine-prilocaine (EMLA) cream Apply a small amount to port a cath site and cover with plastic wrap one hour prior to chemotherapy appointments (Patient not  taking: Reported on 10/08/2019)  . prochlorperazine (COMPAZINE) 10 MG tablet Take 1 tablet (10 mg total) by mouth every 6 (six) hours as needed for nausea or vomiting. (Patient not taking: Reported on 10/08/2019)   No facility-administered encounter medications on file as of 10/08/2019.     ALLERGIES:  No Known Allergies   PHYSICAL EXAM:  ECOG Performance status: 0  Vitals:   10/08/19 0832  BP: 113/80  Pulse: 94  Resp: 18  Temp: (!) 97.1 F (36.2 C)  SpO2: 99%   Filed Weights   10/08/19 0832  Weight: 151 lb 12.8 oz (68.9 kg)    Physical Exam Vitals signs reviewed.  Constitutional:      Appearance: Normal appearance.  Cardiovascular:     Rate and Rhythm: Normal rate and regular rhythm.     Heart sounds: Normal heart sounds.  Pulmonary:     Effort: Pulmonary effort is normal.     Breath sounds: Normal breath sounds.  Abdominal:     General: There is no distension.     Palpations: Abdomen is soft. There is no mass.  Musculoskeletal:        General: No swelling.  Skin:    General: Skin is warm.  Neurological:     General: No focal deficit present.     Mental Status: She is alert and oriented to person, place, and time.  Psychiatric:        Mood and Affect: Mood normal.        Behavior: Behavior normal.      LABORATORY DATA:  I have reviewed the labs as listed.  CBC    Component Value Date/Time   WBC 5.5 10/08/2019 0828   RBC 4.13 10/08/2019 0828   HGB 10.7 (L) 10/08/2019 0828   HCT 34.8 (L) 10/08/2019 0828   PLT 377 10/08/2019 0828   MCV 84.3 10/08/2019 0828   MCH 25.9 (L) 10/08/2019 0828   MCHC 30.7 10/08/2019 0828   RDW 21.1 (H) 10/08/2019 0828   LYMPHSABS 1.9 10/08/2019 0828   MONOABS 0.5 10/08/2019 0828   EOSABS 0.0 10/08/2019 0828   BASOSABS 0.0 10/08/2019 0828   CMP Latest Ref Rng & Units 10/08/2019 09/17/2019 09/03/2019  Glucose 70 - 99 mg/dL 130(H) 149(H) 152(H)  BUN 6 - 20 mg/dL 10 10 10   Creatinine 0.44 - 1.00 mg/dL 0.64 0.65 0.97   Sodium 135 - 145 mmol/L 140 138 136  Potassium 3.5 - 5.1 mmol/L 3.4(L) 3.3(L) 3.6  Chloride 98 - 111 mmol/L 107 104 99  CO2 22 - 32 mmol/L 23 23 22   Calcium 8.9 - 10.3  mg/dL 9.7 9.4 9.8  Total Protein 6.5 - 8.1 g/dL 7.4 7.1 8.2(H)  Total Bilirubin 0.3 - 1.2 mg/dL 0.7 0.7 0.2(L)  Alkaline Phos 38 - 126 U/L 79 70 93  AST 15 - 41 U/L 26 27 41  ALT 0 - 44 U/L 31 22 58(H)       DIAGNOSTIC IMAGING:  I have independently reviewed the scans and discussed with the patient.    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 -US guided biopsy on 08/02/2019 shows invasive mammary carcinoma, E-cadherin positive, HER-2 negative by IHC, ER 70% positive, PR 0%, Ki-67 80%, HER-2 positive by FISH. -MRI of the breast on 08/12/2019 showed 2.6 x 2.4 x 2.8 cm right breast enhancing mass in the upper outer quadrant of the right breast with no abnormal lymph nodes. -PET scan on 08/15/2019 showed mild metabolic activity associated with right axillary node favored to be reactive.  Intense activity associated with soft tissue posterior to the uterus measuring approximately 3.2 x 2.9 cm, SUV 12.8. -2 cycles of neoadjuvant TCHP on 08/27/2019 and 09/17/2019. -She had experienced diarrhea which was controlled with Imodium.  Diarrhea lasted about 1 week.  She also experienced abdominal pain after the start of diarrhea each time.  She was also menstruating at that time. -We reviewed her labs today.  She will proceed with cycle 3 without any dose modifications. -We will reevaluate her in 3 weeks for follow-up.  2.  Hypokalemia: -Potassium today is 3.4.  She will continue potassium 20 mEq daily.  3.  BRCA1 positivity: -She had myRisk myriad panel in 2017 which showed BRCA1 heterozygous mutation positivity. -Increased risk of breast cancer,  ovarian and pancreatic cancer was discussed.  I have recommended bilateral mastectomies and nephrectomies. -PET scan showed increased uptake behind the uterus with SUV 12.8, likely ovarian origin.  We talked with Dr. Denman George about the ovarian findings.  It was recommended to follow-up with ultrasound in 6 to 12 weeks.  Total time spent is 25 minutes with more than 50% of the time spent face-to-face discussing treatment plan, counseling and coordination of care. Orders placed this encounter:  Orders Placed This Encounter  Procedures  . CBC with Differential/Platelet  . Comprehensive metabolic panel  . Magnesium      Derek Jack, MD [place] [phone]

## 2019-10-10 ENCOUNTER — Ambulatory Visit (HOSPITAL_COMMUNITY): Payer: Medicaid Other

## 2019-10-10 ENCOUNTER — Inpatient Hospital Stay (HOSPITAL_COMMUNITY): Payer: Medicaid Other

## 2019-10-10 ENCOUNTER — Encounter (HOSPITAL_COMMUNITY): Payer: Self-pay

## 2019-10-10 ENCOUNTER — Other Ambulatory Visit: Payer: Self-pay

## 2019-10-10 VITALS — BP 114/76 | HR 83 | Temp 98.7°F | Resp 18

## 2019-10-10 DIAGNOSIS — Z5112 Encounter for antineoplastic immunotherapy: Secondary | ICD-10-CM | POA: Diagnosis not present

## 2019-10-10 DIAGNOSIS — C50411 Malignant neoplasm of upper-outer quadrant of right female breast: Secondary | ICD-10-CM

## 2019-10-10 MED ORDER — PEGFILGRASTIM-JMDB 6 MG/0.6ML ~~LOC~~ SOSY
6.0000 mg | PREFILLED_SYRINGE | Freq: Once | SUBCUTANEOUS | Status: AC
  Administered 2019-10-10: 6 mg via SUBCUTANEOUS

## 2019-10-10 NOTE — Progress Notes (Signed)
Monica [place] presents today for injection per MD orders. Fulphila administered SQ in left Upper Arm. Administration without incident. Patient tolerated well.

## 2019-10-10 NOTE — Patient Instructions (Signed)
[facility] at [facility]  Discharge Instructions:   _______________________________________________________________  Thank you for choosing [facility] at [facility] to provide your oncology and hematology care.  To afford each patient quality time with our providers, please arrive at least 15 minutes before your scheduled appointment.  You need to re-schedule your appointment if you arrive 10 or more minutes late.  We strive to give you quality time with our providers, and arriving late affects you and other patients whose appointments are after yours.  Also, if you no show three or more times for appointments you may be dismissed from the clinic.  Again, thank you for choosing [facility] at [facility]. Our hope is that these requests will allow you access to exceptional care and in a timely manner. _______________________________________________________________  If you have questions after your visit, please contact our office at [phone] between the hours of 8:30 a.m. and 5:00 p.m. Voicemails left after 4:30 p.m. will not be returned until the following business day. _______________________________________________________________  For prescription refill requests, have your pharmacy contact our office. _______________________________________________________________  Recommendations made by the consultant and any test results will be sent to your referring physician. _______________________________________________________________ 

## 2019-10-21 ENCOUNTER — Other Ambulatory Visit (HOSPITAL_COMMUNITY): Payer: Self-pay | Admitting: *Deleted

## 2019-10-21 DIAGNOSIS — C50411 Malignant neoplasm of upper-outer quadrant of right female breast: Secondary | ICD-10-CM

## 2019-10-21 MED ORDER — PROCHLORPERAZINE MALEATE 10 MG PO TABS: 10.0000 mg | ORAL_TABLET | Freq: Four times a day (QID) | ORAL | 3 refills | Status: DC | PRN

## 2019-10-21 MED ORDER — POTASSIUM CHLORIDE CRYS ER 10 MEQ PO TBCR: 20.0000 meq | EXTENDED_RELEASE_TABLET | Freq: Two times a day (BID) | ORAL | 3 refills | Status: DC

## 2019-10-22 ENCOUNTER — Ambulatory Visit
Admission: EM | Admit: 2019-10-22 | Discharge: 2019-10-22 | Disposition: A | Discharge status: Home / Self Care | Payer: Medicaid Other | Attending: Emergency Medicine | Admitting: Emergency Medicine

## 2019-10-22 DIAGNOSIS — R300 Dysuria: Secondary | ICD-10-CM | POA: Diagnosis not present

## 2019-10-22 DIAGNOSIS — R102 Pelvic and perineal pain: Secondary | ICD-10-CM | POA: Insufficient documentation

## 2019-10-22 LAB — POCT URINALYSIS DIP (MANUAL ENTRY)
Bilirubin, UA: NEGATIVE
Glucose, UA: NEGATIVE mg/dL
Ketones, POC UA: NEGATIVE mg/dL
Leukocytes, UA: NEGATIVE
Nitrite, UA: NEGATIVE
Protein Ur, POC: NEGATIVE mg/dL
Spec Grav, UA: 1.01 (ref 1.010–1.025)
Urobilinogen, UA: 0.2 E.U./dL
pH, UA: 7 (ref 5.0–8.0)

## 2019-10-22 LAB — POCT URINE PREGNANCY: Preg Test, Ur: NEGATIVE

## 2019-10-22 MED ORDER — PHENAZOPYRIDINE HCL 200 MG PO TABS: 200.0000 mg | ORAL_TABLET | Freq: Three times a day (TID) | ORAL | 0 refills | Status: DC

## 2019-10-22 NOTE — ED Provider Notes (Signed)
[facility]   CC: Burning with urination  SUBJECTIVE:  Monica [place] is a 38 y.o. female hx significant for active breast cancer, currently receiving chemo, who complains of urinary frequency, urgency and dysuria for the past 2 days.  Admits to delayed bathroom breaks, and having diarrhea secondary to chemo.  Also mentions some lower abdominal pressure.  Pain is intermittent and describes it as burning.  Pain is 6/10.  Has tried drinking cranberry juice without relief.  Symptoms are made worse with urination, and wiping.  Admits to similar symptoms in the past related to UTI.  Complains of nausea, and vomiting that she attributes to chemo treatment.  Denies fever, chills, abdominal pain, flank pain, abnormal vaginal discharge or bleeding, hematuria.    LMP: Patient's last menstrual period was 09/29/2019.  ROS: As in HPI.  All other pertinent ROS negative.     Past Medical History:  Diagnosis Date  . BRCA1 positive   . Family history of BRCA1 gene positive   . Family history of breast cancer   . H/O Bell's palsy 2000   Left sided  . HSV infection    Past Surgical History:  Procedure Laterality Date  . IR IMAGING GUIDED PORT INSERTION  08/26/2019  . TUBAL LIGATION     No Known Allergies No current facility-administered medications on file prior to encounter.    Current Outpatient Medications on File Prior to Encounter  Medication Sig Dispense Refill  . CARBOPLATIN IV Inject into the vein every 21 ( twenty-one) days.    . diphenhydrAMINE (BENADRYL) 50 MG tablet Take 50 mg by mouth daily.    . DOCEtaxel (TAXOTERE IV) Inject into the vein every 21 ( twenty-one) days.    . pertuzumab in sodium chloride 0.9 % 250 mL Inject into the vein every 21 ( twenty-one) days.    . potassium chloride SA (KLOR-CON) 10 MEQ tablet Take 2 tablets (20 mEq total) by mouth 2 (two) times daily. 120 tablet 3  . prochlorperazine (COMPAZINE) 10 MG tablet Take 1 tablet (10 mg total) by mouth  every 6 (six) hours as needed for nausea or vomiting. 30 tablet 3  . trastuzumab-dkst 2 mg/kg in sodium chloride 0.9 % 250 mL Inject into the vein every 21 ( twenty-one) days.     Social History   Socioeconomic History  . Marital status: Married    Spouse name: [name]  . Number of children: 5  . Years of education: Not on file  . Highest education level: 12th grade  Occupational History  . Not on file  Social Needs  . Financial resource strain: Somewhat hard  . Food insecurity    Worry: Sometimes true    Inability: Sometimes true  . Transportation needs    Medical: No    Non-medical: No  Tobacco Use  . Smoking status: Never Smoker  . Smokeless tobacco: Never Used  Substance and Sexual Activity  . Alcohol use: No  . Drug use: No  . Sexual activity: Yes    Birth control/protection: Surgical  Lifestyle  . Physical activity    Days per week: 0 days    Minutes per session: 0 min  . Stress: Only a little  Relationships  . Social connections    Talks on phone: More than three times a week    Gets together: More than three times a week    Attends religious service: More than 4 times per year    Active member of club or organization:  Yes    Attends meetings of clubs or organizations: More than 4 times per year    Relationship status: Married  . Intimate partner violence    Fear of current or ex partner: No    Emotionally abused: No    Physically abused: No    Forced sexual activity: No  Other Topics Concern  . Not on file  Social History Narrative  . Not on file   Family History  Problem Relation Age of Onset  . Diabetes Mother   . Breast cancer Mother 34  . Cancer Mother   . Diabetes Maternal Grandmother   . Stomach cancer Maternal Grandfather   . Breast cancer Maternal Aunt        dx in her 56s  . Diabetes Maternal Aunt     OBJECTIVE:  Vitals:   10/22/19 1036  BP: 115/82  Pulse: 96  Resp: 20  Temp: 98.7 F (37.1 C)  SpO2: 98%   General appearance:  Alert in no acute distress HEENT: NCAT. PERRL, EOMI grossly; Oropharynx clear.  Lungs: clear to auscultation bilaterally without adventitious breath sounds Heart: regular rate and rhythm.   Abdomen: soft; non-distended; no tenderness; bowel sounds present; no guarding Back: no CVA tenderness Extremities: no edema; symmetrical with no gross deformities Skin: warm and dry Neurologic: Ambulates from chair to exam table without difficulty Psychological: alert and cooperative; normal mood and affect  Labs Reviewed  POCT URINALYSIS DIP (MANUAL ENTRY) - Abnormal; Notable for the following components:      Result Value   Blood, UA trace-intact (*)    All other components within normal limits  POCT URINE PREGNANCY    ASSESSMENT & PLAN:  1. Dysuria   2. Suprapubic discomfort     Meds ordered this encounter  Medications  . phenazopyridine (PYRIDIUM) 200 MG tablet    Sig: Take 1 tablet (200 mg total) by mouth 3 (three) times daily.    Dispense:  15 tablet    Refill:  0    Order Specific Question:   Supervising Provider    Answer:   Raylene Everts [[id number]]   Urine did not show signs of infection Urine culture sent.  We will call you with abnormal results.   Push fluids and get plenty of rest.   Take pyridium as prescribed and as needed for symptomatic relief.  This medication will turn your urine orange do not be alarmed Follow up with PCP and/or oncologist for further evaluation and management if symptoms persists Return here or go to ER if you have any new or worsening symptoms such as fever, worsening abdominal pain, nausea/vomiting, flank pain, etc...  Outlined signs and symptoms indicating need for more acute intervention. Patient verbalized understanding. After Visit Summary given.     Lestine Box, PA-C 10/22/19 1111

## 2019-10-22 NOTE — ED Triage Notes (Signed)
Pt presents with c/o burning with urination that began on Sunday .

## 2019-10-22 NOTE — Discharge Instructions (Signed)
Urine did not show signs of infection Urine culture sent.  We will call you with abnormal results.   Push fluids and get plenty of rest.   Take pyridium as prescribed and as needed for symptomatic relief.  This medication will turn your urine orange do not be alarmed Follow up with PCP and/or oncologist for further evaluation and management if symptoms persists Return here or go to ER if you have any new or worsening symptoms such as fever, worsening abdominal pain, nausea/vomiting, flank pain, etc..Marland Kitchen

## 2019-10-23 LAB — URINE CULTURE: Culture: NO GROWTH

## 2019-10-29 ENCOUNTER — Inpatient Hospital Stay (HOSPITAL_COMMUNITY): Payer: Medicaid Other | Attending: Hematology

## 2019-10-29 ENCOUNTER — Encounter (HOSPITAL_COMMUNITY): Payer: Self-pay | Admitting: Hematology

## 2019-10-29 ENCOUNTER — Inpatient Hospital Stay (HOSPITAL_BASED_OUTPATIENT_CLINIC_OR_DEPARTMENT_OTHER): Payer: Medicaid Other | Admitting: Hematology

## 2019-10-29 ENCOUNTER — Inpatient Hospital Stay (HOSPITAL_COMMUNITY): Payer: Medicaid Other

## 2019-10-29 ENCOUNTER — Other Ambulatory Visit: Payer: Self-pay

## 2019-10-29 VITALS — BP 114/61 | HR 76 | Temp 97.1°F | Resp 16 | Wt 152.0 lb

## 2019-10-29 DIAGNOSIS — Z5112 Encounter for antineoplastic immunotherapy: Secondary | ICD-10-CM | POA: Insufficient documentation

## 2019-10-29 DIAGNOSIS — C50411 Malignant neoplasm of upper-outer quadrant of right female breast: Secondary | ICD-10-CM

## 2019-10-29 DIAGNOSIS — Z5111 Encounter for antineoplastic chemotherapy: Secondary | ICD-10-CM | POA: Diagnosis not present

## 2019-10-29 DIAGNOSIS — Z79899 Other long term (current) drug therapy: Secondary | ICD-10-CM | POA: Diagnosis not present

## 2019-10-29 DIAGNOSIS — E876 Hypokalemia: Secondary | ICD-10-CM | POA: Insufficient documentation

## 2019-10-29 DIAGNOSIS — R197 Diarrhea, unspecified: Secondary | ICD-10-CM | POA: Diagnosis not present

## 2019-10-29 DIAGNOSIS — Z7689 Persons encountering health services in other specified circumstances: Secondary | ICD-10-CM | POA: Insufficient documentation

## 2019-10-29 LAB — CBC WITH DIFFERENTIAL/PLATELET
Abs Immature Granulocytes: 0.04 10*3/uL (ref 0.00–0.07)
Basophils Absolute: 0 10*3/uL (ref 0.0–0.1)
Basophils Relative: 0 %
Eosinophils Absolute: 0.2 10*3/uL (ref 0.0–0.5)
Eosinophils Relative: 2 %
HCT: 32.3 % — ABNORMAL LOW (ref 36.0–46.0)
Hemoglobin: 9.8 g/dL — ABNORMAL LOW (ref 12.0–15.0)
Immature Granulocytes: 1 %
Lymphocytes Relative: 37 %
Lymphs Abs: 2.4 10*3/uL (ref 0.7–4.0)
MCH: 27.2 pg (ref 26.0–34.0)
MCHC: 30.3 g/dL (ref 30.0–36.0)
MCV: 89.7 fL (ref 80.0–100.0)
Monocytes Absolute: 0.5 10*3/uL (ref 0.1–1.0)
Monocytes Relative: 7 %
Neutro Abs: 3.3 10*3/uL (ref 1.7–7.7)
Neutrophils Relative %: 53 %
Platelets: 216 10*3/uL (ref 150–400)
RBC: 3.6 MIL/uL — ABNORMAL LOW (ref 3.87–5.11)
RDW: 22.6 % — ABNORMAL HIGH (ref 11.5–15.5)
WBC: 6.3 10*3/uL (ref 4.0–10.5)
nRBC: 0 % (ref 0.0–0.2)

## 2019-10-29 LAB — COMPREHENSIVE METABOLIC PANEL
ALT: 35 U/L (ref 0–44)
AST: 33 U/L (ref 15–41)
Albumin: 3.9 g/dL (ref 3.5–5.0)
Alkaline Phosphatase: 74 U/L (ref 38–126)
Anion gap: 12 (ref 5–15)
BUN: 9 mg/dL (ref 6–20)
CO2: 25 mmol/L (ref 22–32)
Calcium: 9.5 mg/dL (ref 8.9–10.3)
Chloride: 103 mmol/L (ref 98–111)
Creatinine, Ser: 0.65 mg/dL (ref 0.44–1.00)
GFR calc Af Amer: >60 mL/min (ref >60)
GFR calc non Af Amer: >60 mL/min (ref >60)
Glucose, Bld: 142 mg/dL — ABNORMAL HIGH (ref 70–99)
Potassium: 3.5 mmol/L (ref 3.5–5.1)
Sodium: 140 mmol/L (ref 135–145)
Total Bilirubin: 0.7 mg/dL (ref 0.3–1.2)
Total Protein: 7.2 g/dL (ref 6.5–8.1)

## 2019-10-29 LAB — MAGNESIUM: Magnesium: 1.4 mg/dL — ABNORMAL LOW (ref 1.7–2.4)

## 2019-10-29 MED ORDER — MAGNESIUM OXIDE 400 (241.3 MG) MG PO TABS: 400.0000 mg | ORAL_TABLET | Freq: Two times a day (BID) | ORAL | 2 refills | Status: DC

## 2019-10-29 MED ORDER — SODIUM CHLORIDE 0.9 % IV SOLN
75.0000 mg/m2 | Freq: Once | INTRAVENOUS | Status: AC
  Administered 2019-10-29: 130 mg via INTRAVENOUS
  Filled 2019-10-29: qty 13

## 2019-10-29 MED ORDER — DIPHENHYDRAMINE HCL 25 MG PO CAPS
50.0000 mg | ORAL_CAPSULE | Freq: Once | ORAL | Status: AC
  Administered 2019-10-29: 10:00:00 25 mg via ORAL
  Filled 2019-10-29: qty 2

## 2019-10-29 MED ORDER — HEPARIN SOD (PORK) LOCK FLUSH 100 UNIT/ML IV SOLN
500.0000 [IU] | Freq: Once | INTRAVENOUS | Status: AC | PRN
  Administered 2019-10-29: 500 [IU]

## 2019-10-29 MED ORDER — PALONOSETRON HCL INJECTION 0.25 MG/5ML
0.2500 mg | Freq: Once | INTRAVENOUS | Status: AC
  Administered 2019-10-29: 11:00:00 0.25 mg via INTRAVENOUS
  Filled 2019-10-29: qty 5

## 2019-10-29 MED ORDER — SODIUM CHLORIDE 0.9 % IV SOLN
420.0000 mg | Freq: Once | INTRAVENOUS | Status: AC
  Administered 2019-10-29: 12:00:00 420 mg via INTRAVENOUS
  Filled 2019-10-29: qty 14

## 2019-10-29 MED ORDER — SODIUM CHLORIDE 0.9 % IV SOLN
Freq: Once | INTRAVENOUS | Status: AC
  Administered 2019-10-29: 11:00:00 via INTRAVENOUS
  Filled 2019-10-29: qty 5

## 2019-10-29 MED ORDER — SODIUM CHLORIDE 0.9 % IV SOLN
784.8000 mg | Freq: Once | INTRAVENOUS | Status: AC
  Administered 2019-10-29: 14:00:00 780 mg via INTRAVENOUS
  Filled 2019-10-29: qty 78

## 2019-10-29 MED ORDER — SODIUM CHLORIDE 0.9% FLUSH
10.0000 mL | INTRAVENOUS | Status: DC | PRN
  Administered 2019-10-29: 09:00:00 10 mL
  Filled 2019-10-29: qty 10

## 2019-10-29 MED ORDER — ACETAMINOPHEN 325 MG PO TABS
650.0000 mg | ORAL_TABLET | Freq: Once | ORAL | Status: AC
  Administered 2019-10-29: 10:00:00 650 mg via ORAL
  Filled 2019-10-29: qty 2

## 2019-10-29 MED ORDER — TRASTUZUMAB-DKST CHEMO 150 MG IV SOLR
450.0000 mg | Freq: Once | INTRAVENOUS | Status: AC
  Administered 2019-10-29: 12:00:00 450 mg via INTRAVENOUS
  Filled 2019-10-29: qty 21.43

## 2019-10-29 MED ORDER — SODIUM CHLORIDE 0.9 % IV SOLN
Freq: Once | INTRAVENOUS | Status: AC
  Administered 2019-10-29: 10:00:00 via INTRAVENOUS

## 2019-10-29 MED ORDER — MAGNESIUM SULFATE 2 GM/50ML IV SOLN
2.0000 g | Freq: Once | INTRAVENOUS | Status: AC
  Administered 2019-10-29: 2 g via INTRAVENOUS
  Filled 2019-10-29: qty 50

## 2019-10-29 MED ORDER — DIPHENHYDRAMINE HCL 25 MG PO CAPS
ORAL_CAPSULE | ORAL | Status: AC
  Filled 2019-10-29: qty 1

## 2019-10-29 NOTE — Patient Instructions (Addendum)
[facility] at [facility] Discharge Instructions  You were seen today by Dr. Delton Coombes. He went over your recent lab results. You will have your treatment today. He will send in a new prescription to your pharmacy for magnesium, please take this twice a day until you see [place] again. He will see you back in 3 weeks for labs, treatment and follow up.   Thank you for choosing [place] at [facility] to provide your oncology and hematology care.  To afford each patient quality time with our provider, please arrive at least 15 minutes before your scheduled appointment time.   If you have a lab appointment with the [facility] please come in thru the  Main Entrance and check in at the main information desk  You need to re-schedule your appointment should you arrive 10 or more minutes late.  We strive to give you quality time with our providers, and arriving late affects you and other patients whose appointments are after yours.  Also, if you no show three or more times for appointments you may be dismissed from the clinic at the providers discretion.     Again, thank you for choosing [facility].  Our hope is that these requests will decrease the amount of time that you wait before being seen by our physicians.       _____________________________________________________________  Should you have questions after your visit to [facility], please contact our office at [phone] between the hours of 8:00 a.m. and 4:30 p.m.  Voicemails left after 4:00 p.m. will not be returned until the following business day.  For prescription refill requests, have your pharmacy contact our office and allow 72 hours.    Cancer Center Support Programs:   > Cancer Support Group  2nd Tuesday of the month 1pm-2pm, Journey Room

## 2019-10-29 NOTE — Patient Instructions (Signed)
[facility] Discharge Instructions for Patients Receiving Chemotherapy  Today you received the following chemotherapy agents   To help prevent nausea and vomiting after your treatment, we encourage you to take your nausea medication   If you develop nausea and vomiting that is not controlled by your nausea medication, call the clinic.   BELOW ARE SYMPTOMS THAT SHOULD BE REPORTED IMMEDIATELY:  *FEVER GREATER THAN 100.5 F  *CHILLS WITH OR WITHOUT FEVER  NAUSEA AND VOMITING THAT IS NOT CONTROLLED WITH YOUR NAUSEA MEDICATION  *UNUSUAL SHORTNESS OF BREATH  *UNUSUAL BRUISING OR BLEEDING  TENDERNESS IN MOUTH AND THROAT WITH OR WITHOUT PRESENCE OF ULCERS  *URINARY PROBLEMS  *BOWEL PROBLEMS  UNUSUAL RASH Items with * indicate a potential emergency and should be followed up as soon as possible.  Feel free to call the clinic should you have any questions or concerns. The clinic phone number is [phone].  Please show the CHEMO ALERT CARD at check-in to the Emergency Department and triage nurse.   

## 2019-10-29 NOTE — Progress Notes (Signed)
Monica Hancock, [place] [address]   CLINIC:  Medical Oncology/Hematology  PCP:  Patient, No Pcp Per No address on file None   REASON FOR VISIT:  HER-2 positive right breast cancer  CURRENT THERAPY: Neoadjuvant TCHP    INTERVAL HISTORY:  [name] 38 y.o. female seen for follow-up prior to cycle 4 of chemotherapy and toxicity assessment.  She is having diarrhea lasting about 2 to 3 days.  She has 2 episodes per day.  She is taking antidiarrheal medication which is helping.  She had also intermittent nausea and vomited once yesterday and twice last week.  She is taking antinausea medication which is also helping.  Appetite and energy levels are 100%.  Denies any fevers or chills.  No tingling or numbness in the extremities was reported.  No new onset pains.   REVIEW OF SYSTEMS:  Review of Systems  Gastrointestinal: Positive for diarrhea, nausea and vomiting.  All other systems reviewed and are negative.    PAST MEDICAL/SURGICAL HISTORY:  Past Medical History:  Diagnosis Date  . BRCA1 positive   . Family history of BRCA1 gene positive   . Family history of breast cancer   . H/O Bell's palsy 2000   Left sided  . HSV infection    Past Surgical History:  Procedure Laterality Date  . IR IMAGING GUIDED PORT INSERTION  08/26/2019  . TUBAL LIGATION       SOCIAL HISTORY:  Social History   Socioeconomic History  . Marital status: Married    Spouse name: Monica Hancock  . Number of children: 5  . Years of education: Not on file  . Highest education level: 12th grade  Occupational History  . Not on file  Social Needs  . Financial resource strain: Somewhat hard  . Food insecurity    Worry: Sometimes true    Inability: Sometimes true  . Transportation needs    Medical: No    Non-medical: No  Tobacco Use  . Smoking status: Never Smoker  . Smokeless tobacco: Never Used  Substance and Sexual Activity  . Alcohol use: No  . Drug use: No  .  Sexual activity: Yes    Birth control/protection: Surgical  Lifestyle  . Physical activity    Days per week: 0 days    Minutes per session: 0 min  . Stress: Only a little  Relationships  . Social connections    Talks on phone: More than three times a week    Gets together: More than three times a week    Attends religious service: More than 4 times per year    Active member of club or organization: Yes    Attends meetings of clubs or organizations: More than 4 times per year    Relationship status: Married  . Intimate partner violence    Fear of current or ex partner: No    Emotionally abused: No    Physically abused: No    Forced sexual activity: No  Other Topics Concern  . Not on file  Social History Narrative  . Not on file    FAMILY HISTORY:  Family History  Problem Relation Age of Onset  . Diabetes Mother   . Breast cancer Mother 39  . Cancer Mother   . Diabetes Maternal Grandmother   . Stomach cancer Maternal Grandfather   . Breast cancer Maternal Aunt        dx in her 36s  . Diabetes Maternal Aunt  CURRENT MEDICATIONS:  Outpatient Encounter Medications as of 10/29/2019  Medication Sig  . CARBOPLATIN IV Inject into the vein every 21 ( twenty-one) days.  . DOCEtaxel (TAXOTERE IV) Inject into the vein every 21 ( twenty-one) days.  . pertuzumab in sodium chloride 0.9 % 250 mL Inject into the vein every 21 ( twenty-one) days.  . phenazopyridine (PYRIDIUM) 200 MG tablet Take 1 tablet (200 mg total) by mouth 3 (three) times daily.  . potassium chloride SA (KLOR-CON) 10 MEQ tablet Take 2 tablets (20 mEq total) by mouth 2 (two) times daily.  . trastuzumab-dkst 2 mg/kg in sodium chloride 0.9 % 250 mL Inject into the vein every 21 ( twenty-one) days.  . diphenhydrAMINE (BENADRYL) 50 MG tablet Take 50 mg by mouth daily.  Marland Kitchen loratadine (CLARITIN) 10 MG tablet Take 10 mg by mouth daily.  . magnesium oxide (MAG-OX) 400 (241.3 Mg) MG tablet Take 1 tablet (400 mg total) by  mouth 2 (two) times daily.  . prochlorperazine (COMPAZINE) 10 MG tablet Take 1 tablet (10 mg total) by mouth every 6 (six) hours as needed for nausea or vomiting. (Patient not taking: Reported on 10/29/2019)   No facility-administered encounter medications on file as of 10/29/2019.     ALLERGIES:  No Known Allergies   PHYSICAL EXAM:  ECOG Performance status: 0  Vitals:   10/29/19 0837  BP: 125/76  Pulse: 90  Resp: 18  Temp: (!) 97.1 F (36.2 C)  SpO2: 99%   Filed Weights   10/29/19 0837  Weight: 152 lb (68.9 kg)    Physical Exam Vitals signs reviewed.  Constitutional:      Appearance: Normal appearance.  Cardiovascular:     Rate and Rhythm: Normal rate and regular rhythm.     Heart sounds: Normal heart sounds.  Pulmonary:     Effort: Pulmonary effort is normal.     Breath sounds: Normal breath sounds.  Abdominal:     General: There is no distension.     Palpations: Abdomen is soft. There is no mass.  Musculoskeletal:        General: No swelling.  Skin:    General: Skin is warm.  Neurological:     General: No focal deficit present.     Mental Status: She is alert and oriented to person, place, and time.  Psychiatric:        Mood and Affect: Mood normal.        Behavior: Behavior normal.   Right breast has no palpable mass or adenopathy.   LABORATORY DATA:  I have reviewed the labs as listed.  CBC    Component Value Date/Time   WBC 6.3 10/29/2019 0812   RBC 3.60 (L) 10/29/2019 0812   HGB 9.8 (L) 10/29/2019 0812   HCT 32.3 (L) 10/29/2019 0812   PLT 216 10/29/2019 0812   MCV 89.7 10/29/2019 0812   MCH 27.2 10/29/2019 0812   MCHC 30.3 10/29/2019 0812   RDW 22.6 (H) 10/29/2019 0812   LYMPHSABS 2.4 10/29/2019 0812   MONOABS 0.5 10/29/2019 0812   EOSABS 0.2 10/29/2019 0812   BASOSABS 0.0 10/29/2019 0812   CMP Latest Ref Rng & Units 10/29/2019 10/08/2019 09/17/2019  Glucose 70 - 99 mg/dL 142(H) 130(H) 149(H)  BUN 6 - 20 mg/dL _0 Creatinine 0.44 -  1.00 mg/dL 0.65 0.64 0.65  Sodium 135 - 145 mmol/L 140 140 138  Potassium 3.5 - 5.1 mmol/L 3.5 3.4(L) 3.3(L)  Chloride 98 - 111 mmol/L 103 107  104  CO2 22 - 32 mmol/L _0 Calcium 8.9 - 10.3 mg/dL 9.5 9.7 9.4  Total Protein 6.5 - 8.1 g/dL 7.2 7.4 7.1  Total Bilirubin 0.3 - 1.2 mg/dL 0.7 0.7 0.7  Alkaline Phos 38 - 126 U/L 74 79 70  AST 15 - 41 U/L 33 26 27  ALT 0 - 44 U/L 35 31 22       DIAGNOSTIC IMAGING:  I have independently reviewed the scans and discussed with the patient.    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 -US guided biopsy on 08/02/2019 shows invasive mammary carcinoma, E-cadherin positive, HER-2 negative by IHC, ER 70% positive, PR 0%, Ki-67 80%, HER-2 positive by FISH. -MRI of the breast on 08/12/2019 showed 2.6 x 2.4 x 2.8 cm right breast enhancing mass in the upper outer quadrant of the right breast with no abnormal lymph nodes. -PET scan on 08/15/2019 showed mild metabolic activity associated with right axillary node favored to be reactive.  Intense activity associated with soft tissue posterior to the uterus measuring approximately 3.2 x 2.9 cm, SUV 12.8. -3 cycles of neoadjuvant TCHP from 08/27/2019 through 10/08/2019. -Physical examination today did not reveal any clearly palpable mass in the right breast upper outer quadrant.  No palpable adenopathy. -We have reviewed labs.  We will proceed with cycle 4 without any dose changes today. -We will see her back in 3 weeks for follow-up.  2.  Hypokalemia: -Potassium today is 3.5.  She will continue potassium 20 mEq daily.  3.  BRCA1 positivity: -She had myRisk myriad panel in 2017 which showed BRCA1 heterozygous mutation positivity. -Increased risk of breast cancer, ovarian and pancreatic cancer was discussed.  I have  recommended bilateral mastectomies and nephrectomies. -PET scan showed increased uptake behind the uterus with SUV 12.8, likely ovarian origin.  We talked with Dr. Denman George about the ovarian findings.  It was recommended to follow-up with ultrasound in 6 to 12 weeks.  4.  Hypomagnesemia: -Magnesium is 1.4 today.  She will receive magnesium 2 g IV. -I have sent a prescription for magnesium 400 mg twice daily.  Total time spent is 25 minutes with more than 50% of the time spent face-to-face discussing treatment plan, counseling and coordination of care. Orders placed this encounter:  No orders of the defined types were placed in this encounter.     Derek Jack, MD [place] [phone]

## 2019-10-29 NOTE — Assessment & Plan Note (Addendum)
1.  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 -US guided biopsy on 08/02/2019 shows invasive mammary carcinoma, E-cadherin positive, HER-2 negative by IHC, ER 70% positive, PR 0%, Ki-67 80%, HER-2 positive by FISH. -MRI of the breast on 08/12/2019 showed 2.6 x 2.4 x 2.8 cm right breast enhancing mass in the upper outer quadrant of the right breast with no abnormal lymph nodes. -PET scan on 08/15/2019 showed mild metabolic activity associated with right axillary node favored to be reactive.  Intense activity associated with soft tissue posterior to the uterus measuring approximately 3.2 x 2.9 cm, SUV 12.8. -3 cycles of neoadjuvant TCHP from 08/27/2019 through 10/08/2019. -Physical examination today did not reveal any clearly palpable mass in the right breast upper outer quadrant.  No palpable adenopathy. -We have reviewed labs.  We will proceed with cycle 4 without any dose changes today. -We will see her back in 3 weeks for follow-up.  2.  Hypokalemia: -Potassium today is 3.5.  She will continue potassium 20 mEq daily.  3.  BRCA1 positivity: -She had myRisk myriad panel in 2017 which showed BRCA1 heterozygous mutation positivity. -Increased risk of breast cancer, ovarian and pancreatic cancer was discussed.  I have recommended bilateral mastectomies and nephrectomies. -PET scan showed increased uptake behind the uterus with SUV 12.8, likely ovarian origin.  We talked with Dr. Denman George about the ovarian findings.  It was recommended to follow-up with ultrasound in 6 to 12 weeks.  4.  Hypomagnesemia: -Magnesium is 1.4 today.  She will receive magnesium 2 g IV. -I have sent a prescription for magnesium 400 mg twice daily.

## 2019-10-29 NOTE — Progress Notes (Signed)
Labs reviewed at office visit today . Proceed as planned per MD  Treatment given per orders. Patient tolerated it well without problems. Vitals stable and discharged home from clinic ambulatory. Follow up as scheduled.

## 2019-10-29 NOTE — Progress Notes (Signed)
10/29/19  Magnesium 1.4   Received order to give Magnesium sulfate 2 gm IVPB x 1 today.  Orders entered.  T.O. Dr Beckey Downing LPN/Elicia Lui Ronnald Ramp, PharmD

## 2019-10-31 ENCOUNTER — Other Ambulatory Visit: Payer: Self-pay

## 2019-10-31 ENCOUNTER — Encounter (HOSPITAL_COMMUNITY): Payer: Self-pay

## 2019-10-31 ENCOUNTER — Inpatient Hospital Stay (HOSPITAL_COMMUNITY): Payer: Medicaid Other

## 2019-10-31 VITALS — BP 121/75 | HR 77 | Temp 97.3°F | Resp 16

## 2019-10-31 DIAGNOSIS — C50411 Malignant neoplasm of upper-outer quadrant of right female breast: Secondary | ICD-10-CM

## 2019-10-31 DIAGNOSIS — Z5111 Encounter for antineoplastic chemotherapy: Secondary | ICD-10-CM | POA: Diagnosis not present

## 2019-10-31 MED ORDER — PEGFILGRASTIM-JMDB 6 MG/0.6ML ~~LOC~~ SOSY
6.0000 mg | PREFILLED_SYRINGE | Freq: Once | SUBCUTANEOUS | Status: AC
  Administered 2019-10-31: 6 mg via SUBCUTANEOUS
  Filled 2019-10-31: qty 0.6

## 2019-10-31 NOTE — Progress Notes (Signed)
Patient tolerated injection with no complaints voiced.  Site clean and dry with no bruising or swelling noted at site.  Band aid applied.  Vss with discharge and left ambulatory with no s/s of distress noted.  

## 2019-11-06 ENCOUNTER — Encounter (HOSPITAL_COMMUNITY): Payer: Self-pay | Admitting: *Deleted

## 2019-11-13 ENCOUNTER — Encounter: Payer: Self-pay | Admitting: Family Medicine

## 2019-11-13 ENCOUNTER — Other Ambulatory Visit: Payer: Self-pay

## 2019-11-13 ENCOUNTER — Ambulatory Visit: Payer: Medicaid Other | Admitting: Family Medicine

## 2019-11-13 VITALS — BP 115/70 | HR 92 | Temp 98.3°F | Ht <= 58 in | Wt 147.4 lb

## 2019-11-13 DIAGNOSIS — R110 Nausea: Secondary | ICD-10-CM

## 2019-11-13 DIAGNOSIS — R748 Abnormal levels of other serum enzymes: Secondary | ICD-10-CM

## 2019-11-13 DIAGNOSIS — R630 Anorexia: Secondary | ICD-10-CM

## 2019-11-13 DIAGNOSIS — L309 Dermatitis, unspecified: Secondary | ICD-10-CM

## 2019-11-13 DIAGNOSIS — R197 Diarrhea, unspecified: Secondary | ICD-10-CM

## 2019-11-13 DIAGNOSIS — R7309 Other abnormal glucose: Secondary | ICD-10-CM

## 2019-11-13 LAB — POCT GLYCOSYLATED HEMOGLOBIN (HGB A1C): Hemoglobin A1C: 5.3 % (ref 4.0–5.6)

## 2019-11-13 NOTE — Patient Instructions (Addendum)
Trial of protein shakes/energy balls  Trial of Xyzal for itch/irritation  aveno lotion/cream

## 2019-11-13 NOTE — Progress Notes (Signed)
New Patient Office Visit  Subjective:  Patient ID: Monica Hancock, female    DOB: [birth year]  Age: 37 y.o. MRN: [id number]  CC:  Chief Complaint  Patient presents with  . Establish Care  skin irritation-hands and face-especially after chemo Elevated glucose  HPI Monica Hancock presents for breast cancer-right breast-chemo ongoing Pt states +FH diabetes-gestational diabetes-no insulin, metformin with pregnancy-7 years ago +BRACA -2017-diagnosis-mammogram previously-pt noted breast mass on palpation-08/27/19-chemo q 3 wks for 6 weeks(5th treatment next week) Pt considering bilat mastectomy, total hysterectomy due to +BRACA Weight loss due to chemo-pt states chemo causing nausea-diarrhea-vomiting-using phenergan prn Low K  Low Mg Pt with irritation on the face and hands-using otc lotion and benadryl due to itch-worse week after chemo Past Medical History:  Diagnosis Date  . BRCA1 positive   . Family history of BRCA1 gene positive   . Family history of breast cancer   . H/O Bell's palsy 2000   Left sided  . HSV infection     Past Surgical History:  Procedure Laterality Date  . IR IMAGING GUIDED PORT INSERTION  08/26/2019  . TUBAL LIGATION      Family History  Problem Relation Age of Onset  . Diabetes Mother   . Breast cancer Mother 59  . Cancer Mother   . Diabetes Maternal Grandmother   . Stomach cancer Maternal Grandfather   . Breast cancer Maternal Aunt        dx in her 55s  . Diabetes Maternal Aunt     Social History   Socioeconomic History  . Marital status: Married    Spouse name: [name]  . Number of children: 5  . Years of education: Not on file  . Highest education level: 12th grade  Occupational History  . Not on file  Tobacco Use  . Smoking status: Never Smoker  . Smokeless tobacco: Never Used  Substance and Sexual Activity  . Alcohol use: No  . Drug use: No  . Sexual activity: Yes    Birth control/protection: Surgical  Other Topics  Concern  . Not on file  Social History Narrative  . Not on file   Social Determinants of Health   Financial Resource Strain: Medium Risk  . Difficulty of Paying Living Expenses: Somewhat hard  Food Insecurity: Food Insecurity Present  . Worried About Charity fundraiser in the Last Year: Sometimes true  . Ran Out of Food in the Last Year: Sometimes true  Transportation Needs: No Transportation Needs  . Lack of Transportation (Medical): No  . Lack of Transportation (Non-Medical): No  Physical Activity: Inactive  . Days of Exercise per Week: 0 days  . Minutes of Exercise per Session: 0 min  Stress: No Stress Concern Present  . Feeling of Stress : Only a little  Social Connections: Not Isolated  . Frequency of Communication with Friends and Family: More than three times a week  . Frequency of Social Gatherings with Friends and Family: More than three times a week  . Attends Religious Services: More than 4 times per year  . Active Member of Clubs or Organizations: Yes  . Attends Archivist Meetings: More than 4 times per year  . Marital Status: Married  Human resources officer Violence: Not At Risk  . Fear of Current or Ex-Partner: No  . Emotionally Abused: No  . Physically Abused: No  . Sexually Abused: No    ROS Review of Systems  Constitutional: Negative.   HENT: Negative.  Eyes: Negative.        Glasses  Respiratory: Negative.   Cardiovascular: Negative.   Gastrointestinal: Positive for abdominal pain and nausea.  Endocrine: Negative.   Genitourinary: Positive for dysuria.  Musculoskeletal: Negative.   Skin:       Dermatitis-hands an face  Allergic/Immunologic: Positive for environmental allergies.  Neurological: Negative.   Hematological: Negative.   Psychiatric/Behavioral: Negative.     Objective:   Today's Vitals: BP 115/70 (BP Location: Left Arm, Patient Position: Sitting, Cuff Size: Normal)   Pulse 92   Temp 98.3 F (36.8 C) (Oral)   Ht 4' 8"   (1.422 m)   Wt 147 lb 6.4 oz (66.9 kg)   SpO2 98%   BMI 33.05 kg/m   Physical Exam Constitutional:      Appearance: Normal appearance.  HENT:     Head: Atraumatic.  Cardiovascular:     Rate and Rhythm: Normal rate and regular rhythm.     Pulses: Normal pulses.     Heart sounds: Normal heart sounds.  Pulmonary:     Effort: Pulmonary effort is normal.     Breath sounds: Normal breath sounds.  Musculoskeletal:     Cervical back: Normal range of motion and neck supple.  Neurological:     Mental Status: She is alert and oriented to person, place, and time.  Psychiatric:        Mood and Affect: Mood normal.        Behavior: Behavior normal.     Assessment & Plan:   1. Dermatitis xyzal aveno lotion/creme 2. Diarrhea, unspecified type Loperamide-related to chemo-diet changes discussed 3. Nausea Phenergan prn related to chemo 4. Loss of appetite Protein shakes/energy balls 5. Elevated alkaline phosphatase level Likely related to CA-will continue to follow  6. Elevated glucose A1c 5.3  F/u as needed Benny Lennert MD

## 2019-11-14 DIAGNOSIS — R748 Abnormal levels of other serum enzymes: Secondary | ICD-10-CM | POA: Insufficient documentation

## 2019-11-14 DIAGNOSIS — R7309 Other abnormal glucose: Secondary | ICD-10-CM | POA: Insufficient documentation

## 2019-11-19 ENCOUNTER — Other Ambulatory Visit: Payer: Self-pay

## 2019-11-19 ENCOUNTER — Inpatient Hospital Stay (HOSPITAL_BASED_OUTPATIENT_CLINIC_OR_DEPARTMENT_OTHER): Payer: Medicaid Other | Admitting: Nurse Practitioner

## 2019-11-19 ENCOUNTER — Encounter (HOSPITAL_COMMUNITY): Payer: Self-pay | Admitting: Nurse Practitioner

## 2019-11-19 ENCOUNTER — Inpatient Hospital Stay (HOSPITAL_COMMUNITY): Payer: Medicaid Other

## 2019-11-19 VITALS — BP 108/75 | HR 71 | Temp 96.7°F | Resp 18

## 2019-11-19 DIAGNOSIS — R197 Diarrhea, unspecified: Secondary | ICD-10-CM

## 2019-11-19 DIAGNOSIS — C50411 Malignant neoplasm of upper-outer quadrant of right female breast: Secondary | ICD-10-CM

## 2019-11-19 DIAGNOSIS — Z5111 Encounter for antineoplastic chemotherapy: Secondary | ICD-10-CM | POA: Diagnosis not present

## 2019-11-19 DIAGNOSIS — R110 Nausea: Secondary | ICD-10-CM

## 2019-11-19 DIAGNOSIS — E876 Hypokalemia: Secondary | ICD-10-CM

## 2019-11-19 LAB — CBC WITH DIFFERENTIAL/PLATELET
Abs Immature Granulocytes: 0.02 10*3/uL (ref 0.00–0.07)
Basophils Absolute: 0 10*3/uL (ref 0.0–0.1)
Basophils Relative: 0 %
Eosinophils Absolute: 0 10*3/uL (ref 0.0–0.5)
Eosinophils Relative: 0 %
HCT: 30.5 % — ABNORMAL LOW (ref 36.0–46.0)
Hemoglobin: 9.6 g/dL — ABNORMAL LOW (ref 12.0–15.0)
Immature Granulocytes: 0 %
Lymphocytes Relative: 37 %
Lymphs Abs: 2 10*3/uL (ref 0.7–4.0)
MCH: 29.7 pg (ref 26.0–34.0)
MCHC: 31.5 g/dL (ref 30.0–36.0)
MCV: 94.4 fL (ref 80.0–100.0)
Monocytes Absolute: 0.5 10*3/uL (ref 0.1–1.0)
Monocytes Relative: 10 %
Neutro Abs: 2.8 10*3/uL (ref 1.7–7.7)
Neutrophils Relative %: 53 %
Platelets: 141 10*3/uL — ABNORMAL LOW (ref 150–400)
RBC: 3.23 MIL/uL — ABNORMAL LOW (ref 3.87–5.11)
RDW: 23.6 % — ABNORMAL HIGH (ref 11.5–15.5)
WBC: 5.3 10*3/uL (ref 4.0–10.5)
nRBC: 0 % (ref 0.0–0.2)

## 2019-11-19 LAB — COMPREHENSIVE METABOLIC PANEL
ALT: 34 U/L (ref 0–44)
AST: 28 U/L (ref 15–41)
Albumin: 3.8 g/dL (ref 3.5–5.0)
Alkaline Phosphatase: 62 U/L (ref 38–126)
Anion gap: 11 (ref 5–15)
BUN: 11 mg/dL (ref 6–20)
CO2: 24 mmol/L (ref 22–32)
Calcium: 9.6 mg/dL (ref 8.9–10.3)
Chloride: 106 mmol/L (ref 98–111)
Creatinine, Ser: 0.59 mg/dL (ref 0.44–1.00)
GFR calc Af Amer: >60 mL/min (ref >60)
GFR calc non Af Amer: >60 mL/min (ref >60)
Glucose, Bld: 115 mg/dL — ABNORMAL HIGH (ref 70–99)
Potassium: 3.2 mmol/L — ABNORMAL LOW (ref 3.5–5.1)
Sodium: 141 mmol/L (ref 135–145)
Total Bilirubin: 0.4 mg/dL (ref 0.3–1.2)
Total Protein: 7.1 g/dL (ref 6.5–8.1)

## 2019-11-19 MED ORDER — SODIUM CHLORIDE 0.9 % IV SOLN
420.0000 mg | Freq: Once | INTRAVENOUS | Status: AC
  Administered 2019-11-19: 420 mg via INTRAVENOUS
  Filled 2019-11-19: qty 14

## 2019-11-19 MED ORDER — SODIUM CHLORIDE 0.9 % IV SOLN: Freq: Once | INTRAVENOUS | Status: AC

## 2019-11-19 MED ORDER — LOPERAMIDE HCL 2 MG PO CAPS
4.0000 mg | ORAL_CAPSULE | Freq: Once | ORAL | Status: AC
  Administered 2019-11-19: 11:00:00 4 mg via ORAL
  Filled 2019-11-19: qty 2

## 2019-11-19 MED ORDER — PALONOSETRON HCL INJECTION 0.25 MG/5ML
INTRAVENOUS | Status: AC
  Filled 2019-11-19: qty 5

## 2019-11-19 MED ORDER — TRASTUZUMAB-DKST CHEMO 150 MG IV SOLR
450.0000 mg | Freq: Once | INTRAVENOUS | Status: AC
  Administered 2019-11-19: 450 mg via INTRAVENOUS
  Filled 2019-11-19: qty 21.43

## 2019-11-19 MED ORDER — HEPARIN SOD (PORK) LOCK FLUSH 100 UNIT/ML IV SOLN
500.0000 [IU] | Freq: Once | INTRAVENOUS | Status: AC | PRN
  Administered 2019-11-19: 500 [IU]

## 2019-11-19 MED ORDER — ACETAMINOPHEN 325 MG PO TABS
ORAL_TABLET | ORAL | Status: AC
  Filled 2019-11-19: qty 2

## 2019-11-19 MED ORDER — POTASSIUM CHLORIDE CRYS ER 20 MEQ PO TBCR
EXTENDED_RELEASE_TABLET | ORAL | Status: AC
  Filled 2019-11-19: qty 2

## 2019-11-19 MED ORDER — POTASSIUM CHLORIDE CRYS ER 20 MEQ PO TBCR
40.0000 meq | EXTENDED_RELEASE_TABLET | Freq: Once | ORAL | Status: AC
  Administered 2019-11-19: 40 meq via ORAL

## 2019-11-19 MED ORDER — DIPHENHYDRAMINE HCL 25 MG PO CAPS
50.0000 mg | ORAL_CAPSULE | Freq: Once | ORAL | Status: AC
  Administered 2019-11-19: 50 mg via ORAL
  Filled 2019-11-19: qty 2

## 2019-11-19 MED ORDER — SODIUM CHLORIDE 0.9 % IV SOLN
Freq: Once | INTRAVENOUS | Status: AC
  Filled 2019-11-19: qty 5

## 2019-11-19 MED ORDER — SODIUM CHLORIDE 0.9 % IV SOLN
784.8000 mg | Freq: Once | INTRAVENOUS | Status: AC
  Administered 2019-11-19: 780 mg via INTRAVENOUS
  Filled 2019-11-19: qty 78

## 2019-11-19 MED ORDER — ACETAMINOPHEN 325 MG PO TABS
650.0000 mg | ORAL_TABLET | Freq: Once | ORAL | Status: AC
  Administered 2019-11-19: 650 mg via ORAL

## 2019-11-19 MED ORDER — SODIUM CHLORIDE 0.9 % IV SOLN
75.0000 mg/m2 | Freq: Once | INTRAVENOUS | Status: AC
  Administered 2019-11-19: 130 mg via INTRAVENOUS
  Filled 2019-11-19: qty 13

## 2019-11-19 MED ORDER — SODIUM CHLORIDE 0.9% FLUSH
10.0000 mL | INTRAVENOUS | Status: DC | PRN
  Administered 2019-11-19: 10 mL

## 2019-11-19 MED ORDER — PROCHLORPERAZINE MALEATE 10 MG PO TABS: 10.0000 mg | ORAL_TABLET | Freq: Four times a day (QID) | ORAL | 3 refills | Status: DC | PRN

## 2019-11-19 MED ORDER — DIPHENHYDRAMINE HCL 50 MG/ML IJ SOLN
INTRAMUSCULAR | Status: AC
  Filled 2019-11-19: qty 1

## 2019-11-19 MED ORDER — PALONOSETRON HCL INJECTION 0.25 MG/5ML
0.2500 mg | Freq: Once | INTRAVENOUS | Status: AC
  Administered 2019-11-19: 0.25 mg via INTRAVENOUS

## 2019-11-19 NOTE — Patient Instructions (Signed)
[facility] at [facility] Discharge Instructions  Follow-up in 3 weeks with labs and treatment.   Thank you for choosing [facility] at [facility] to provide your oncology and hematology care.  To afford each patient quality time with our provider, please arrive at least 15 minutes before your scheduled appointment time.   If you have a lab appointment with the Cancer Center please come in thru the Main Entrance and check in at the main information desk.  You need to re-schedule your appointment should you arrive 10 or more minutes late.  We strive to give you quality time with our providers, and arriving late affects you and other patients whose appointments are after yours.  Also, if you no show three or more times for appointments you may be dismissed from the clinic at the providers discretion.     Again, thank you for choosing [facility].  Our hope is that these requests will decrease the amount of time that you wait before being seen by our physicians.       _____________________________________________________________  Should you have questions after your visit to [facility], please contact our office at [phone] between the hours of 8:00 a.m. and 4:30 p.m.  Voicemails left after 4:00 p.m. will not be returned until the following business day.  For prescription refill requests, have your pharmacy contact our office and allow 72 hours.    Due to Covid, you will need to wear a mask upon entering the hospital. If you do not have a mask, a mask will be given to you at the Main Entrance upon arrival. For doctor visits, patients may have 1 support person with them. For treatment visits, patients can not have anyone with them due to social distancing guidelines and our immunocompromised population.      

## 2019-11-19 NOTE — Progress Notes (Signed)
11/19/19  Confirmed tubal ligation 08/2019 - no urine pregnancy test required prior to treatments.  Henreitta Leber, PharmD

## 2019-11-19 NOTE — Progress Notes (Signed)
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 [facility])   08/27/2019 -  Chemotherapy   The patient had palonosetron (ALOXI) injection 0.25 mg, 0.25 mg, Intravenous,  Once, 5 of 6 cycles Administration: 0.25 mg (08/27/2019), 0.25 mg (09/17/2019), 0.25 mg (10/08/2019), 0.25 mg (10/29/2019) pegfilgrastim-jmdb (FULPHILA) injection 6 mg, 6 mg, Subcutaneous,  Once, 5 of 6 cycles Administration: 6 mg (08/29/2019), 6 mg (09/19/2019), 6 mg (10/10/2019), 6 mg (10/31/2019) trastuzumab (HERCEPTIN) 600 mg in sodium chloride 0.9 % 250 mL chemo infusion, 567 mg (100 % of original dose 8 mg/kg), Intravenous,  Once, 1 of 1 cycle Dose modification: 8 mg/kg (original dose 8 mg/kg, Cycle 1, Reason: Other (see comments), Comment: using up last of our Herceptin at [facility]) Administration: 600 mg (08/27/2019) CARBOplatin (PARAPLATIN) 780 mg in sodium chloride 0.9 % 250 mL chemo infusion, 780 mg (100 % of original dose 784.8 mg), Intravenous,  Once, 5 of 6 cycles Dose modification:   (original dose 784.8 mg, Cycle 1),   (original dose 784.8 mg, Cycle 2),   (original dose 784.8 mg, Cycle 5), 784.8 mg (original dose 784.8 mg, Cycle 5, Reason: Other (see comments), Comment: scr 0.8 entered),   (original dose 784.8 mg, Cycle 3),   (original dose 784.8 mg, Cycle 4) Administration: 780 mg (08/27/2019), 780 mg (09/17/2019), 780 mg (10/08/2019), 780 mg (10/29/2019) DOCEtaxel (TAXOTERE) 130 mg in sodium chloride 0.9 % 250 mL chemo infusion, 75 mg/m2 = 130 mg, Intravenous,  Once, 5  of 6 cycles Administration: 130 mg (08/27/2019), 130 mg (09/17/2019), 130 mg (10/08/2019), 130 mg (10/29/2019) pertuzumab (PERJETA) 840 mg in sodium chloride 0.9 % 250 mL chemo infusion, 840 mg, Intravenous, Once, 5 of 6 cycles Administration: 840 mg (08/27/2019), 420 mg (09/17/2019), 420 mg (10/08/2019), 420 mg (10/29/2019) fosaprepitant (EMEND) 150 mg, dexamethasone (DECADRON) 12 mg in sodium chloride 0.9 % 145 mL IVPB, , Intravenous,  Once, 5 of 6 cycles Administration:  (08/27/2019),  (09/17/2019),  (10/08/2019),  (10/29/2019) trastuzumab-dkst (OGIVRI) 450 mg in sodium chloride 0.9 % 250 mL chemo infusion, 420 mg, Intravenous,  Once, 4 of 5 cycles Administration: 450 mg (09/17/2019), 450 mg (10/08/2019), 450 mg (10/29/2019)  for chemotherapy treatment.       INTERVAL HISTORY:  Monica Hancock 38 y.o. female returns for routine follow-up for breast cancer.  Patient reports she is doing well since her last treatment.  She does report some mild rash on her face after treatment.  She reports it only last a week.  She reports occasional diarrhea that is resolved with Imodium. Denies any nausea, vomiting, or diarrhea. Denies any new pains. Had not noticed any recent bleeding such as epistaxis, hematuria or hematochezia. Denies recent chest pain on exertion, shortness of breath on minimal exertion, pre-syncopal episodes, or palpitations. Denies any numbness or tingling in hands or feet. Denies any recent fevers, infections, or recent hospitalizations. Patient reports appetite at 100% and energy level at 100%.    REVIEW OF SYSTEMS:  Review of Systems  Skin: Positive for rash.  All other systems reviewed and are negative.  PAST MEDICAL/SURGICAL HISTORY:  Past Medical History:  Diagnosis Date  . BRCA1 positive   . Family history of BRCA1 gene positive   . Family history of breast cancer   . H/O Bell's palsy 2000   Left sided  . HSV infection    Past Surgical History:  Procedure Laterality Date    . IR IMAGING GUIDED PORT INSERTION  08/26/2019  . TUBAL LIGATION       SOCIAL HISTORY:  Social History   Socioeconomic History  . Marital status: Married    Spouse name: [name]  . Number of children: 5  . Years of education: Not on file  . Highest education level: 12th grade  Occupational History  . Not on file  Tobacco Use  . Smoking status: Never Smoker  . Smokeless tobacco: Never Used  Substance and Sexual Activity  . Alcohol use: No  . Drug use: No  . Sexual activity: Yes    Birth control/protection: Surgical  Other Topics Concern  . Not on file  Social History Narrative  . Not on file   Social Determinants of Health   Financial Resource Strain: Medium Risk  . Difficulty of Paying Living Expenses: Somewhat hard  Food Insecurity: Food Insecurity Present  . Worried About Charity fundraiser in the Last Year: Sometimes true  . Ran Out of Food in the Last Year: Sometimes true  Transportation Needs: No Transportation Needs  . Lack of Transportation (Medical): No  . Lack of Transportation (Non-Medical): No  Physical Activity: Inactive  . Days of Exercise per Week: 0 days  . Minutes of Exercise per Session: 0 min  Stress: No Stress Concern Present  . Feeling of Stress : Only a little  Social Connections: Not Isolated  . Frequency of Communication with Friends and Family: More than three times a week  . Frequency of Social Gatherings with Friends and Family: More than three times a week  . Attends Religious Services: More than 4 times per year  . Active Member of Clubs or Organizations: Yes  . Attends Archivist Meetings: More than 4 times per year  . Marital Status: Married  Human resources officer Violence: Not At Risk  . Fear of Current or Ex-Partner: No  . Emotionally Abused: No  . Physically Abused: No  . Sexually Abused: No    FAMILY HISTORY:  Family History  Problem Relation Age of Onset  . Diabetes Mother   . Breast cancer Mother 79  . Cancer  Mother   . Diabetes Maternal Grandmother   . Stomach cancer Maternal Grandfather   . Breast cancer Maternal Aunt        dx in her 73s  . Diabetes Maternal Aunt     CURRENT MEDICATIONS:  Outpatient Encounter Medications as of 11/19/2019  Medication Sig  . CARBOPLATIN IV Inject into the vein every 21 ( twenty-one) days.  . DOCEtaxel (TAXOTERE IV) Inject into the vein every 21 ( twenty-one) days.  . magnesium oxide (MAG-OX) 400 (241.3 Mg) MG tablet Take 1 tablet (400 mg total) by mouth 2 (two) times daily.  . pertuzumab in sodium chloride 0.9 % 250 mL Inject into the vein every 21 ( twenty-one) days.  . potassium chloride SA (KLOR-CON) 10 MEQ tablet Take 2 tablets (20 mEq total) by mouth 2 (two) times daily.  . trastuzumab-dkst 2 mg/kg in sodium chloride 0.9 % 250 mL Inject into the vein every 21 ( twenty-one) days.  . [DISCONTINUED] phenazopyridine (PYRIDIUM) 200 MG tablet  Take 1 tablet (200 mg total) by mouth 3 (three) times daily.  . diphenhydrAMINE (BENADRYL) 50 MG tablet Take 50 mg by mouth daily.  Marland Kitchen loratadine (CLARITIN) 10 MG tablet Take 10 mg by mouth daily.  . [DISCONTINUED] prochlorperazine (COMPAZINE) 10 MG tablet Take 1 tablet (10 mg total) by mouth every 6 (six) hours as needed for nausea or vomiting. (Patient not taking: Reported on 10/29/2019)   No facility-administered encounter medications on file as of 11/19/2019.    ALLERGIES:  No Known Allergies   PHYSICAL EXAM:  ECOG Performance status: 1  Vitals:   11/19/19 0812 11/19/19 0823  BP: (!) 139/91 (!) 139/91  Pulse: 97 97  Resp: 18 18  Temp: 97.9 F (36.6 C) 97.9 F (36.6 C)  SpO2: 100% 100%   Filed Weights   11/19/19 0812 11/19/19 0823  Weight: 149 lb 12.8 oz (67.9 kg) 149 lb 12.8 oz (67.9 kg)    Physical Exam Constitutional:      Appearance: Normal appearance. She is normal weight.  Cardiovascular:     Rate and Rhythm: Normal rate and regular rhythm.     Heart sounds: Normal heart sounds.  Pulmonary:      Effort: Pulmonary effort is normal.     Breath sounds: Normal breath sounds.  Abdominal:     General: Bowel sounds are normal.     Palpations: Abdomen is soft.  Musculoskeletal:        General: Normal range of motion.  Skin:    General: Skin is warm.  Neurological:     Mental Status: She is alert and oriented to person, place, and time. Mental status is at baseline.  Psychiatric:        Mood and Affect: Mood normal.        Behavior: Behavior normal.        Thought Content: Thought content normal.        Judgment: Judgment normal.      LABORATORY DATA:  I have reviewed the labs as listed.  CBC    Component Value Date/Time   WBC 5.3 11/19/2019 0822   RBC 3.23 (L) 11/19/2019 0822   HGB 9.6 (L) 11/19/2019 0822   HCT 30.5 (L) 11/19/2019 0822   PLT 141 (L) 11/19/2019 0822   MCV 94.4 11/19/2019 0822   MCH 29.7 11/19/2019 0822   MCHC 31.5 11/19/2019 0822   RDW 23.6 (H) 11/19/2019 0822   LYMPHSABS 2.0 11/19/2019 0822   MONOABS 0.5 11/19/2019 0822   EOSABS 0.0 11/19/2019 0822   BASOSABS 0.0 11/19/2019 0822   CMP Latest Ref Rng & Units 11/19/2019 10/29/2019 10/08/2019  Glucose 70 - 99 mg/dL 115(H) 142(H) 130(H)  BUN 6 - 20 mg/dL _0 Creatinine 0.44 - 1.00 mg/dL 0.59 0.65 0.64  Sodium 135 - 145 mmol/L 141 140 140  Potassium 3.5 - 5.1 mmol/L 3.2(L) 3.5 3.4(L)  Chloride 98 - 111 mmol/L 106 103 107  CO2 22 - 32 mmol/L _1 Calcium 8.9 - 10.3 mg/dL 9.6 9.5 9.7  Total Protein 6.5 - 8.1 g/dL 7.1 7.2 7.4  Total Bilirubin 0.3 - 1.2 mg/dL 0.4 0.7 0.7  Alkaline Phos 38 - 126 U/L 62 74 79  AST 15 - 41 U/L 28 33 26  ALT 0 - 44 U/L 34 35 31     I personally performed a face-to-face visit.  All questions were answered to patient's stated satisfaction. Encouraged patient to call with any new concerns or questions before his next  visit to the cancer center and we can certain see him sooner, if needed.     ASSESSMENT & PLAN:   Infiltrating ductal carcinoma of upper-outer  quadrant of right breast in female ([employer]) 1.  Right breast IDC, ER positive, HER-2 positive by FISH: -Mammogram on 07/23/2019 showed lobulated hypodense mass. -Ultrasound-guided biopsy on 08/02/2019 shows invasive mammary carcinoma, E-cadherin, HER-2 negative by IHC, ER 70% positive, PR 0%, Ki-67 80%, HER-2 positive by FISH. -MRI of the breast on 08/12/2019 shows 2.6 x 2.4 x 2.8 cm right breast enhancing mass in the upper outer quadrant of the right breast with no abnormal lymph nodes. -PET scan on 08/15/2019 shows mild metabolic activity associated with right axillary node favored to be reactive.  Intense activity associated with soft tissue posterior to the uterus measuring approximately 3.2 x 2.9 cm, SUV 12.8. -4 cycles of neoadjuvant TCHP from 08/27/2019 through 10/29/2019. -Physical examination today did not reveal any palpable masses in the right breast upper outer quadrant.  No palpable adenopathy. -Labs on 11/19/2019 showed potassium 3.2, hemoglobin 9.6, WBC 5.3, platelets 141 -We will proceed with cycle 5 without any dose changes today. -We will see her back in 3 weeks with repeat labs and treatment.  2.  Hypokalemia: -Potassium today on 11/19/2019 was 3.2. -I have ordered 40 mEq of p.o. potassium once. -She will continue taking potassium 20 mEq daily at home.  3.  BRCA1 positivity: -She had my risk myriad panel in 2017 which showed BRCA1 heterozygous mutation positivity. -Increased risk of breast cancer, ovarian and pancreatic cancer was discussed.  I have recommended bilateral mastectomies and oophorectomies. -PET scan showed increased uptake behind the uterus with SUV of 12.8, likely ovarian origin.  We talked with Dr. Denman George about the ovarian findings.  It was recommended to follow-up with ultrasound in 12 to 6 weeks.  4.  Hypomagnesemia: -Patient is taking magnesium 400 mg twice daily.      Orders placed this encounter:  Orders Placed This Encounter  Procedures  . Magnesium  .  CBC with Differential/Platelet  . Comprehensive metabolic panel  . Lactate dehydrogenase     Francene Finders, FNP-C [place] [phone]

## 2019-11-19 NOTE — Assessment & Plan Note (Signed)
1.  Right breast IDC, ER positive, HER-2 positive by FISH: -Mammogram on 07/23/2019 showed lobulated hypodense mass. -Ultrasound guided biopsy on 08/02/2019 showed invasive mamillary carcinoma, E-cadherin positive, HER-2 negative by IHC, ER 70% positive, PR 0%, Ki-67 80%, HER-2 positive by FISH. -MRI of the breast on 08/12/2019 showed 2.6 x 2.4 x 2.8 cm right breast enhancing mass in the upper outer quadrant of the right breast with no abnormal lymph nodes. -PET scan on 08/15/2019 showed mild metabolic activity associated with right axillary node favored to be reactive.  Intense activity associated with the soft tissue posterior of the uterus measuring approximately 3.2 x 2.9 cm, SUV 12.8. -3 cycles of neoadjuvant TCHP from 08/27/2019 through 10/08/2019. -

## 2019-11-19 NOTE — Assessment & Plan Note (Signed)
1.  Right breast IDC, ER positive, HER-2 positive by FISH: -Mammogram on 07/23/2019 showed lobulated hypodense mass. -Ultrasound-guided biopsy on 08/02/2019 shows invasive mammary carcinoma, E-cadherin, HER-2 negative by IHC, ER 70% positive, PR 0%, Ki-67 80%, HER-2 positive by FISH. -MRI of the breast on 08/12/2019 shows 2.6 x 2.4 x 2.8 cm right breast enhancing mass in the upper outer quadrant of the right breast with no abnormal lymph nodes. -PET scan on 08/15/2019 shows mild metabolic activity associated with right axillary node favored to be reactive.  Intense activity associated with soft tissue posterior to the uterus measuring approximately 3.2 x 2.9 cm, SUV 12.8. -4 cycles of neoadjuvant TCHP from 08/27/2019 through 10/29/2019. -Physical examination today did not reveal any palpable masses in the right breast upper outer quadrant.  No palpable adenopathy. -Labs on 11/19/2019 showed potassium 3.2, hemoglobin 9.6, WBC 5.3, platelets 141 -We will proceed with cycle 5 without any dose changes today. -We will see her back in 3 weeks with repeat labs and treatment.  2.  Hypokalemia: -Potassium today on 11/19/2019 was 3.2. -I have ordered 40 mEq of p.o. potassium once. -She will continue taking potassium 20 mEq daily at home.  3.  BRCA1 positivity: -She had my risk myriad panel in 2017 which showed BRCA1 heterozygous mutation positivity. -Increased risk of breast cancer, ovarian and pancreatic cancer was discussed.  I have recommended bilateral mastectomies and oophorectomies. -PET scan showed increased uptake behind the uterus with SUV of 12.8, likely ovarian origin.  We talked with Dr. Denman George about the ovarian findings.  It was recommended to follow-up with ultrasound in 12 to 6 weeks.  4.  Hypomagnesemia: -Patient is taking magnesium 400 mg twice daily.

## 2019-11-19 NOTE — Patient Instructions (Signed)
[facility] Discharge Instructions for Patients Receiving Chemotherapy  Today you received the following chemotherapy agents   To help prevent nausea and vomiting after your treatment, we encourage you to take your nausea medication   If you develop nausea and vomiting that is not controlled by your nausea medication, call the clinic.   BELOW ARE SYMPTOMS THAT SHOULD BE REPORTED IMMEDIATELY:  *FEVER GREATER THAN 100.5 F  *CHILLS WITH OR WITHOUT FEVER  NAUSEA AND VOMITING THAT IS NOT CONTROLLED WITH YOUR NAUSEA MEDICATION  *UNUSUAL SHORTNESS OF BREATH  *UNUSUAL BRUISING OR BLEEDING  TENDERNESS IN MOUTH AND THROAT WITH OR WITHOUT PRESENCE OF ULCERS  *URINARY PROBLEMS  *BOWEL PROBLEMS  UNUSUAL RASH Items with * indicate a potential emergency and should be followed up as soon as possible.  Feel free to call the clinic should you have any questions or concerns. The clinic phone number is [phone].  Please show the CHEMO ALERT CARD at check-in to the Emergency Department and triage nurse.   

## 2019-11-19 NOTE — Progress Notes (Signed)
Patient presents today for treatment and follow up visit with RLockamy NP. BP diastolic elevated upon arrival. [facility] reviewed. Patient has no complaints of any pain or changes since the last visit. Patient states a slight rash developed after her last two treatments and she didn't tell anyone. Patient denies any rash today. Patient states her last treatment she developed a rash on her face and body that she took Benadryl for and it went away. Labs pending.   Verbal orders received to proceed with treatment. Labs reviewed by NP. K+ 3.2 today. NP aware and orders received.   Treatment given today per MD orders. Tolerated infusion without adverse affects. Vital signs stable. No complaints at this time. Discharged from clinic ambulatory. F/U with [facility] as scheduled.

## 2019-11-21 ENCOUNTER — Inpatient Hospital Stay (HOSPITAL_COMMUNITY): Payer: Medicaid Other

## 2019-11-21 ENCOUNTER — Other Ambulatory Visit: Payer: Self-pay

## 2019-11-21 ENCOUNTER — Encounter (HOSPITAL_COMMUNITY): Payer: Self-pay

## 2019-11-21 VITALS — BP 103/68 | HR 81 | Temp 97.6°F | Resp 18

## 2019-11-21 DIAGNOSIS — Z5111 Encounter for antineoplastic chemotherapy: Secondary | ICD-10-CM | POA: Diagnosis not present

## 2019-11-21 DIAGNOSIS — C50411 Malignant neoplasm of upper-outer quadrant of right female breast: Secondary | ICD-10-CM

## 2019-11-21 MED ORDER — PEGFILGRASTIM-JMDB 6 MG/0.6ML ~~LOC~~ SOSY
6.0000 mg | PREFILLED_SYRINGE | Freq: Once | SUBCUTANEOUS | Status: AC
  Administered 2019-11-21: 6 mg via SUBCUTANEOUS
  Filled 2019-11-21: qty 0.6

## 2019-11-21 NOTE — Progress Notes (Signed)
Monica Hancock presents today for injection per MD orders. Fulphila 6 mg administered SQ in left Abdomen. Administration without incident. Patient tolerated well. Vitals stable.  Patient discharged ambulatory.  Follow up as scheduled.

## 2019-11-21 NOTE — Patient Instructions (Signed)
[place] at [facility]  Discharge Instructions:  Fulphila given today. _______________________________________________________________  Thank you for choosing [place] at [facility] to provide your oncology and hematology care.  To afford each patient quality time with our providers, please arrive at least 15 minutes before your scheduled appointment.  You need to re-schedule your appointment if you arrive 10 or more minutes late.  We strive to give you quality time with our providers, and arriving late affects you and other patients whose appointments are after yours.  Also, if you no show three or more times for appointments you may be dismissed from the clinic.  Again, thank you for choosing [place] at [place] hope is that these requests will allow you access to exceptional care and in a timely manner. _______________________________________________________________  If you have questions after your visit, please contact our office at [phone] between the hours of 8:30 a.m. and 5:00 p.m. Voicemails left after 4:30 p.m. will not be returned until the following business day. _______________________________________________________________  For prescription refill requests, have your pharmacy contact our office. _______________________________________________________________  Recommendations made by the consultant and any test results will be sent to your referring physician. _______________________________________________________________

## 2019-11-26 ENCOUNTER — Encounter (HOSPITAL_COMMUNITY): Payer: Self-pay | Admitting: General Practice

## 2019-11-26 NOTE — Progress Notes (Signed)
[facility] CSW Progress Notes  Enrolled in [employer] Program.  Will receive 7 meals/week delivered by [employer] for next 6 months.  Kinte Trim, LCSW Clinical Social Worker Phone:  [phone] Cell:  [phone] 

## 2019-12-10 ENCOUNTER — Inpatient Hospital Stay (HOSPITAL_COMMUNITY): Payer: Medicaid Other | Attending: Hematology

## 2019-12-10 ENCOUNTER — Other Ambulatory Visit: Payer: Self-pay

## 2019-12-10 ENCOUNTER — Inpatient Hospital Stay (HOSPITAL_BASED_OUTPATIENT_CLINIC_OR_DEPARTMENT_OTHER): Payer: Medicaid Other | Admitting: Hematology

## 2019-12-10 ENCOUNTER — Inpatient Hospital Stay (HOSPITAL_COMMUNITY): Payer: Medicaid Other

## 2019-12-10 VITALS — BP 125/75 | HR 95 | Resp 16

## 2019-12-10 VITALS — BP 114/75 | HR 88 | Temp 97.1°F | Resp 18 | Wt 148.8 lb

## 2019-12-10 DIAGNOSIS — Z5112 Encounter for antineoplastic immunotherapy: Secondary | ICD-10-CM | POA: Insufficient documentation

## 2019-12-10 DIAGNOSIS — R252 Cramp and spasm: Secondary | ICD-10-CM | POA: Diagnosis not present

## 2019-12-10 DIAGNOSIS — Z170 Estrogen receptor positive status [ER+]: Secondary | ICD-10-CM | POA: Insufficient documentation

## 2019-12-10 DIAGNOSIS — Z79899 Other long term (current) drug therapy: Secondary | ICD-10-CM | POA: Insufficient documentation

## 2019-12-10 DIAGNOSIS — R350 Frequency of micturition: Secondary | ICD-10-CM | POA: Diagnosis not present

## 2019-12-10 DIAGNOSIS — R3915 Urgency of urination: Secondary | ICD-10-CM

## 2019-12-10 DIAGNOSIS — E876 Hypokalemia: Secondary | ICD-10-CM | POA: Insufficient documentation

## 2019-12-10 DIAGNOSIS — C50411 Malignant neoplasm of upper-outer quadrant of right female breast: Secondary | ICD-10-CM | POA: Diagnosis not present

## 2019-12-10 DIAGNOSIS — Z5111 Encounter for antineoplastic chemotherapy: Secondary | ICD-10-CM | POA: Insufficient documentation

## 2019-12-10 LAB — COMPREHENSIVE METABOLIC PANEL
ALT: 22 U/L (ref 0–44)
AST: 22 U/L (ref 15–41)
Albumin: 3.7 g/dL (ref 3.5–5.0)
Alkaline Phosphatase: 67 U/L (ref 38–126)
Anion gap: 8 (ref 5–15)
BUN: 16 mg/dL (ref 6–20)
CO2: 25 mmol/L (ref 22–32)
Calcium: 9.3 mg/dL (ref 8.9–10.3)
Chloride: 108 mmol/L (ref 98–111)
Creatinine, Ser: 0.54 mg/dL (ref 0.44–1.00)
GFR calc Af Amer: >60 mL/min (ref >60)
GFR calc non Af Amer: >60 mL/min (ref >60)
Glucose, Bld: 104 mg/dL — ABNORMAL HIGH (ref 70–99)
Potassium: 3.4 mmol/L — ABNORMAL LOW (ref 3.5–5.1)
Sodium: 141 mmol/L (ref 135–145)
Total Bilirubin: 0.5 mg/dL (ref 0.3–1.2)
Total Protein: 7.2 g/dL (ref 6.5–8.1)

## 2019-12-10 LAB — CBC WITH DIFFERENTIAL/PLATELET
Abs Immature Granulocytes: 0.02 10*3/uL (ref 0.00–0.07)
Basophils Absolute: 0 10*3/uL (ref 0.0–0.1)
Basophils Relative: 0 %
Eosinophils Absolute: 0 10*3/uL (ref 0.0–0.5)
Eosinophils Relative: 0 %
HCT: 28.2 % — ABNORMAL LOW (ref 36.0–46.0)
Hemoglobin: 8.9 g/dL — ABNORMAL LOW (ref 12.0–15.0)
Immature Granulocytes: 0 %
Lymphocytes Relative: 39 %
Lymphs Abs: 1.8 10*3/uL (ref 0.7–4.0)
MCH: 32.4 pg (ref 26.0–34.0)
MCHC: 31.6 g/dL (ref 30.0–36.0)
MCV: 102.5 fL — ABNORMAL HIGH (ref 80.0–100.0)
Monocytes Absolute: 0.6 10*3/uL (ref 0.1–1.0)
Monocytes Relative: 12 %
Neutro Abs: 2.3 10*3/uL (ref 1.7–7.7)
Neutrophils Relative %: 49 %
Platelets: 176 10*3/uL (ref 150–400)
RBC: 2.75 MIL/uL — ABNORMAL LOW (ref 3.87–5.11)
RDW: 19.9 % — ABNORMAL HIGH (ref 11.5–15.5)
WBC: 4.8 10*3/uL (ref 4.0–10.5)
nRBC: 0 % (ref 0.0–0.2)

## 2019-12-10 LAB — URINALYSIS, ROUTINE W REFLEX MICROSCOPIC
Bilirubin Urine: NEGATIVE
Glucose, UA: NEGATIVE mg/dL
Hgb urine dipstick: NEGATIVE
Ketones, ur: NEGATIVE mg/dL
Leukocytes,Ua: NEGATIVE
Nitrite: NEGATIVE
Protein, ur: NEGATIVE mg/dL
Specific Gravity, Urine: 1.021 (ref 1.005–1.030)
pH: 7 (ref 5.0–8.0)

## 2019-12-10 LAB — MAGNESIUM: Magnesium: 1.2 mg/dL — ABNORMAL LOW (ref 1.7–2.4)

## 2019-12-10 LAB — LACTATE DEHYDROGENASE: LDH: 144 U/L (ref 98–192)

## 2019-12-10 MED ORDER — HEPARIN SOD (PORK) LOCK FLUSH 100 UNIT/ML IV SOLN
500.0000 [IU] | Freq: Once | INTRAVENOUS | Status: AC | PRN
  Administered 2019-12-10: 500 [IU]

## 2019-12-10 MED ORDER — MAGNESIUM SULFATE 2 GM/50ML IV SOLN
2.0000 g | Freq: Once | INTRAVENOUS | Status: AC
  Administered 2019-12-10: 13:00:00 2 g via INTRAVENOUS
  Filled 2019-12-10: qty 50

## 2019-12-10 MED ORDER — SODIUM CHLORIDE 0.9 % IV SOLN
75.0000 mg/m2 | Freq: Once | INTRAVENOUS | Status: AC
  Administered 2019-12-10: 13:00:00 130 mg via INTRAVENOUS
  Filled 2019-12-10: qty 13

## 2019-12-10 MED ORDER — SODIUM CHLORIDE 0.9% FLUSH
10.0000 mL | INTRAVENOUS | Status: DC | PRN
  Administered 2019-12-10: 10 mL

## 2019-12-10 MED ORDER — DIPHENHYDRAMINE HCL 25 MG PO CAPS
25.0000 mg | ORAL_CAPSULE | Freq: Once | ORAL | Status: AC
  Administered 2019-12-10: 10:00:00 25 mg via ORAL
  Filled 2019-12-10: qty 1

## 2019-12-10 MED ORDER — SODIUM CHLORIDE 0.9 % IV SOLN
784.8000 mg | Freq: Once | INTRAVENOUS | Status: AC
  Administered 2019-12-10: 14:00:00 780 mg via INTRAVENOUS
  Filled 2019-12-10: qty 78

## 2019-12-10 MED ORDER — ACETAMINOPHEN 325 MG PO TABS
650.0000 mg | ORAL_TABLET | Freq: Once | ORAL | Status: AC
  Administered 2019-12-10: 10:00:00 650 mg via ORAL
  Filled 2019-12-10: qty 2

## 2019-12-10 MED ORDER — SODIUM CHLORIDE 0.9 % IV SOLN: Freq: Once | INTRAVENOUS | Status: AC

## 2019-12-10 MED ORDER — PALONOSETRON HCL INJECTION 0.25 MG/5ML
0.2500 mg | Freq: Once | INTRAVENOUS | Status: AC
  Administered 2019-12-10: 0.25 mg via INTRAVENOUS
  Filled 2019-12-10: qty 5

## 2019-12-10 MED ORDER — MAGNESIUM OXIDE 400 (241.3 MG) MG PO TABS: 400.0000 mg | ORAL_TABLET | Freq: Three times a day (TID) | ORAL | 1 refills | Status: DC

## 2019-12-10 MED ORDER — TRASTUZUMAB-DKST CHEMO 150 MG IV SOLR
6.0000 mg/kg | Freq: Once | INTRAVENOUS | Status: AC
  Administered 2019-12-10: 11:00:00 420 mg via INTRAVENOUS
  Filled 2019-12-10: qty 20

## 2019-12-10 MED ORDER — SODIUM CHLORIDE 0.9 % IV SOLN
420.0000 mg | Freq: Once | INTRAVENOUS | Status: AC
  Administered 2019-12-10: 12:00:00 420 mg via INTRAVENOUS
  Filled 2019-12-10: qty 14

## 2019-12-10 MED ORDER — SODIUM CHLORIDE 0.9 % IV SOLN
Freq: Once | INTRAVENOUS | Status: AC
  Filled 2019-12-10: qty 5

## 2019-12-10 NOTE — Patient Instructions (Addendum)
[place] at [facility] Discharge Instructions  You were seen today by Dr. Delton Coombes. He went over your recent lab results. You will get your last Chemo treatment today. He will refer you to Dr. Arnoldo Morale for bilateral mastectomy. He will also refer you to [name] for complete hysterectomy. He will repeat an echocardiogram prior to your next visit. Continue taking your potassium as directed, he will send in a new prescription for magnesium you will need to start taking this three times a day. He will see you back in 3 months for labs, treatment and follow up.   Thank you for choosing [place] at [facility] to provide your oncology and hematology care.  To afford each patient quality time with our provider, please arrive at least 15 minutes before your scheduled appointment time.   If you have a lab appointment with the [employer] please come in thru the  Main Entrance and check in at the main information desk  You need to re-schedule your appointment should you arrive 10 or more minutes late.  We strive to give you quality time with our providers, and arriving late affects you and other patients whose appointments are after yours.  Also, if you no show three or more times for appointments you may be dismissed from the clinic at the providers discretion.     Again, thank you for choosing [facility].  Our hope is that these requests will decrease the amount of time that you wait before being seen by our physicians.       _____________________________________________________________  Should you have questions after your visit to [facility], please contact our office at [phone] between the hours of 8:00 a.m. and 4:30 p.m.  Voicemails left after 4:00 p.m. will not be returned until the following business day.  For prescription refill requests, have your pharmacy contact our office and allow 72 hours.    Cancer  Center Support Programs:   > Cancer Support Group  2nd Tuesday of the month 1pm-2pm, Journey Room

## 2019-12-10 NOTE — Progress Notes (Signed)
Labs reviewed today. Per MD proceed with treatment today. Magnesium noted 1.2, will give additional Mag per orders.   Treatment given per orders. Patient tolerated it well without problems. Vitals stable and discharged home from clinic ambulatory. Follow up as scheduled.

## 2019-12-10 NOTE — Progress Notes (Signed)
[place], [place] [address]   CLINIC:  Medical Oncology/Hematology  PCP:  [name], [place] [address] [phone]   REASON FOR VISIT:  HER-2 positive right breast cancer  CURRENT THERAPY: Neoadjuvant TCHP    INTERVAL HISTORY:  [name] 39 y.o. female seen for follow-up prior to cycle 6 of chemotherapy and toxicity assessment.  Reported urinary frequency for the past 2 days and denies any dysuria.  Reported sleepiness with Benadryl.  Appetite and energy levels are 100%.  She reportedly gets a rash few days after each chemotherapy mainly on the hands.  She showed me pictures of the rash.  She does not have any rash today.  She reported feeling of cramps in her jaw when she is eating.   REVIEW OF SYSTEMS:  Review of Systems  Gastrointestinal: Positive for diarrhea.  All other systems reviewed and are negative.    PAST MEDICAL/SURGICAL HISTORY:  Past Medical History:  Diagnosis Date  . BRCA1 positive   . Family history of BRCA1 gene positive   . Family history of breast cancer   . H/O Bell's palsy 2000   Left sided  . HSV infection    Past Surgical History:  Procedure Laterality Date  . IR IMAGING GUIDED PORT INSERTION  08/26/2019  . TUBAL LIGATION       SOCIAL HISTORY:  Social History   Socioeconomic History  . Marital status: Married    Spouse name: [name]  . Number of children: 5  . Years of education: Not on file  . Highest education level: 12th grade  Occupational History  . Not on file  Tobacco Use  . Smoking status: Never Smoker  . Smokeless tobacco: Never Used  Substance and Sexual Activity  . Alcohol use: No  . Drug use: No  . Sexual activity: Yes    Birth control/protection: Surgical  Other Topics Concern  . Not on file  Social History Narrative  . Not on file   Social Determinants of Health   Financial Resource Strain: Medium Risk  . Difficulty of Paying Living  Expenses: Somewhat hard  Food Insecurity: Food Insecurity Present  . Worried About Charity fundraiser in the Last Year: Sometimes true  . Ran Out of Food in the Last Year: Sometimes true  Transportation Needs: No Transportation Needs  . Lack of Transportation (Medical): No  . Lack of Transportation (Non-Medical): No  Physical Activity: Inactive  . Days of Exercise per Week: 0 days  . Minutes of Exercise per Session: 0 min  Stress: No Stress Concern Present  . Feeling of Stress : Only a little  Social Connections: Not Isolated  . Frequency of Communication with Friends and Family: More than three times a week  . Frequency of Social Gatherings with Friends and Family: More than three times a week  . Attends Religious Services: More than 4 times per year  . Active Member of Clubs or Organizations: Yes  . Attends Archivist Meetings: More than 4 times per year  . Marital Status: Married  Human resources officer Violence: Not At Risk  . Fear of Current or Ex-Partner: No  . Emotionally Abused: No  . Physically Abused: No  . Sexually Abused: No    FAMILY HISTORY:  Family History  Problem Relation Age of Onset  . Diabetes Mother   . Breast cancer Mother 59  . Cancer Mother   . Diabetes Maternal Grandmother   .  Stomach cancer Maternal Grandfather   . Breast cancer Maternal Aunt        dx in her 6s  . Diabetes Maternal Aunt     CURRENT MEDICATIONS:  Outpatient Encounter Medications as of 12/10/2019  Medication Sig  . CARBOPLATIN IV Inject into the vein every 21 ( twenty-one) days.  . diphenhydrAMINE (BENADRYL) 50 MG tablet Take 50 mg by mouth daily.  . DOCEtaxel (TAXOTERE IV) Inject into the vein every 21 ( twenty-one) days.  Marland Kitchen loratadine (CLARITIN) 10 MG tablet Take 10 mg by mouth daily.  . pertuzumab in sodium chloride 0.9 % 250 mL Inject into the vein every 21 ( twenty-one) days.  . potassium chloride SA (KLOR-CON) 10 MEQ tablet Take 2 tablets (20 mEq total) by mouth 2  (two) times daily.  . trastuzumab-dkst 2 mg/kg in sodium chloride 0.9 % 250 mL Inject into the vein every 21 ( twenty-one) days.  . [DISCONTINUED] magnesium oxide (MAG-OX) 400 (241.3 Mg) MG tablet Take 1 tablet (400 mg total) by mouth 2 (two) times daily.  . magnesium oxide (MAG-OX) 400 (241.3 Mg) MG tablet Take 1 tablet (400 mg total) by mouth 3 (three) times daily.  . prochlorperazine (COMPAZINE) 10 MG tablet Take 1 tablet (10 mg total) by mouth every 6 (six) hours as needed for nausea or vomiting.   No facility-administered encounter medications on file as of 12/10/2019.    ALLERGIES:  No Known Allergies   PHYSICAL EXAM:  ECOG Performance status: 0  Vitals:   12/10/19 0830  BP: 114/75  Pulse: 88  Resp: 18  Temp: (!) 97.1 F (36.2 C)  SpO2: 100%   Filed Weights   12/10/19 0830  Weight: 148 lb 12.8 oz (67.5 kg)    Physical Exam Vitals reviewed.  Constitutional:      Appearance: Normal appearance.  Cardiovascular:     Rate and Rhythm: Normal rate and regular rhythm.     Heart sounds: Normal heart sounds.  Pulmonary:     Effort: Pulmonary effort is normal.     Breath sounds: Normal breath sounds.  Abdominal:     General: There is no distension.     Palpations: Abdomen is soft. There is no mass.  Musculoskeletal:        General: No swelling.  Lymphadenopathy:     Cervical: No cervical adenopathy.  Skin:    General: Skin is warm.  Neurological:     General: No focal deficit present.     Mental Status: She is alert and oriented to person, place, and time.  Psychiatric:        Mood and Affect: Mood normal.        Behavior: Behavior normal.   Right breast has no palpable mass or adenopathy.   LABORATORY DATA:  I have reviewed the labs as listed.  CBC    Component Value Date/Time   WBC 4.8 12/10/2019 0823   RBC 2.75 (L) 12/10/2019 0823   HGB 8.9 (L) 12/10/2019 0823   HCT 28.2 (L) 12/10/2019 0823   PLT 176 12/10/2019 0823   MCV 102.5 (H) 12/10/2019 0823    MCH 32.4 12/10/2019 0823   MCHC 31.6 12/10/2019 0823   RDW 19.9 (H) 12/10/2019 0823   LYMPHSABS 1.8 12/10/2019 0823   MONOABS 0.6 12/10/2019 0823   EOSABS 0.0 12/10/2019 0823   BASOSABS 0.0 12/10/2019 0823   CMP Latest Ref Rng & Units 12/10/2019 11/19/2019 10/29/2019  Glucose 70 - 99 mg/dL 104(H) 115(H) 142(H)  BUN 6 -  20 mg/dL 16 11 9   Creatinine 0.44 - 1.00 mg/dL 0.54 0.59 0.65  Sodium 135 - 145 mmol/L 141 141 140  Potassium 3.5 - 5.1 mmol/L 3.4(L) 3.2(L) 3.5  Chloride 98 - 111 mmol/L 108 106 103  CO2 22 - 32 mmol/L 25 24 25   Calcium 8.9 - 10.3 mg/dL 9.3 9.6 9.5  Total Protein 6.5 - 8.1 g/dL 7.2 7.1 7.2  Total Bilirubin 0.3 - 1.2 mg/dL 0.5 0.4 0.7  Alkaline Phos 38 - 126 U/L 67 62 74  AST 15 - 41 U/L 22 28 33  ALT 0 - 44 U/L 22 34 35       DIAGNOSTIC IMAGING:  I have independently reviewed the scans and discussed with the patient.    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  Right breast IDC, ER positive, HER-2 positive by FISH: -Mammogram on 07/23/2019 showed lobulated hypodense mass. -Ultrasound-guided biopsy on 08/02/2019 shows invasive mammary carcinoma, E-cadherin, HER-2 negative by IHC, ER 70% positive, PR 0%, Ki-67 80%, HER-2 positive by FISH. -MRI of the breast on 08/12/2019 shows 2.6 x 2.4 x 2.8 cm right breast enhancing mass in the upper outer quadrant of the right breast with no abnormal lymph nodes. -PET scan on 08/15/2019 shows mild metabolic activity associated with right axillary node favored to be reactive.  Intense activity associated with soft tissue posterior to the uterus measuring approximately 3.2 x 2.9 cm, SUV 12.8. -5 cycles of neoadjuvant TCHP from 08/27/2019 through 11/19/2019. -I have reviewed her labs today.  She complained of increased urinary frequency for the past 2 days.  No dysuria. -We checked a UA in our office.  It was negative for infection. -She will proceed with cycle 6  today.  This is her last cycle.  She will come back in 3 weeks for Herceptin and Perjeta. -She will be referred back to Dr. Arnoldo Morale for bilateral mastectomies.  2.  Hypokalemia: -She is taking potassium 20 mEq twice daily. -Her potassium today is 3.4.  She will continue it twice daily.  3.  BRCA1 positivity: -She had my risk myriad panel in 2017 which showed BRCA1 heterozygous mutation positivity. -Increased risk of breast cancer, ovarian and pancreatic cancer was discussed.  I have recommended bilateral mastectomies and oophorectomies. -PET scan showed increased uptake behind the uterus with SUV 12.8, likely ovarian origin. -I will make a referral to [name] for bilateral nephrectomies.  4.  Severe hypomagnesemia: -She is taking magnesium twice daily at home.  Her magnesium today is 1.2. -She will be given 2 g of IV magnesium.  We will increase her magnesium to 3 times a day.  5.  High risk drug monitoring: -Last echo on 08/21/2019 shows ejection fraction 60 to 65%. -I will obtain another echocardiogram prior to next visit.  We will continue to monitor echocardiograms every 3 months while she is on Herceptin and Pertuzumab.  Total time spent is 30 minutes with more than 50% of the time spent face-to-face discussing treatment plan, counseling and coordination of care. Orders placed this encounter:  Orders Placed This Encounter  Procedures  . CBC with Differential/Platelet  . Comprehensive metabolic panel  . CBC with Differential/Platelet  . Comprehensive metabolic panel  . Urinalysis, Routine w reflex microscopic  . CBC with Differential/Platelet  . Comprehensive metabolic panel  . ECHOCARDIOGRAM COMPLETE      Derek Jack, [place] [phone]

## 2019-12-10 NOTE — Progress Notes (Signed)
Patient has been assessed, vital signs and labs have been reviewed by Dr. Delton Coombes. ANC, Creatinine, LFTs, and Platelets are within treatment parameters per Dr. Delton Coombes. Magnesium is low today at 1.2, please give 2 grams of magnesium IV today. Please give 1 Benadryl instead of 2 today. Also please do a urinalysis for urinary symptoms.The patient is good to proceed with treatment at this time.

## 2019-12-12 ENCOUNTER — Ambulatory Visit (INDEPENDENT_AMBULATORY_CARE_PROVIDER_SITE_OTHER): Payer: Medicaid Other | Admitting: General Surgery

## 2019-12-12 ENCOUNTER — Encounter: Payer: Self-pay | Admitting: General Surgery

## 2019-12-12 ENCOUNTER — Inpatient Hospital Stay (HOSPITAL_COMMUNITY): Payer: Medicaid Other

## 2019-12-12 ENCOUNTER — Other Ambulatory Visit: Payer: Self-pay

## 2019-12-12 VITALS — BP 103/73 | HR 88 | Temp 97.3°F | Resp 16

## 2019-12-12 VITALS — BP 104/72 | HR 91 | Temp 98.3°F | Resp 16 | Ht <= 58 in | Wt 147.0 lb

## 2019-12-12 DIAGNOSIS — Z1501 Genetic susceptibility to malignant neoplasm of breast: Secondary | ICD-10-CM

## 2019-12-12 DIAGNOSIS — Z1509 Genetic susceptibility to other malignant neoplasm: Secondary | ICD-10-CM

## 2019-12-12 DIAGNOSIS — C50411 Malignant neoplasm of upper-outer quadrant of right female breast: Secondary | ICD-10-CM

## 2019-12-12 DIAGNOSIS — Z5111 Encounter for antineoplastic chemotherapy: Secondary | ICD-10-CM | POA: Diagnosis not present

## 2019-12-12 MED ORDER — PEGFILGRASTIM-JMDB 6 MG/0.6ML ~~LOC~~ SOSY
6.0000 mg | PREFILLED_SYRINGE | Freq: Once | SUBCUTANEOUS | Status: AC
  Administered 2019-12-12: 6 mg via SUBCUTANEOUS
  Filled 2019-12-12: qty 0.6

## 2019-12-12 NOTE — Progress Notes (Signed)
Injection given per orders. Patient tolerated it well without problems. Vitals stable and discharged home from clinic ambulatory. Follow up as scheduled.  

## 2019-12-12 NOTE — Progress Notes (Signed)
Monica Hancock; [id number]; 15-Jun-1981   HPI Patient is a 39 year old Hispanic female who was referred to my care by Dr. Delton Coombes of oncology for evaluation treatment of bilateral mastectomies.  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  She also has BRCA1 gene positivity.  She has a strong family history of breast cancer in her mother who was diagnosed at the age of 19.  She is finishing up her neoadjuvant therapy and would like to undergo bilateral mastectomy.  She is also going to be evaluated by gynecology for TAH-BSO.  She currently has no pain. Past Medical History:  Diagnosis Date  . BRCA1 positive   . Family history of BRCA1 gene positive   . Family history of breast cancer   . H/O Bell's palsy 2000   Left sided  . HSV infection     Past Surgical History:  Procedure Laterality Date  . IR IMAGING GUIDED PORT INSERTION  08/26/2019  . TUBAL LIGATION      Family History  Problem Relation Age of Onset  . Diabetes Mother   . Breast cancer Mother 47  . Cancer Mother   . Diabetes Maternal Grandmother   . Stomach cancer Maternal Grandfather   . Breast cancer Maternal Aunt        dx in her 76s  . Diabetes Maternal Aunt     Current Outpatient Medications on File Prior to Visit  Medication Sig Dispense Refill  . CARBOPLATIN IV Inject into the vein every 21 ( twenty-one) days.    . diphenhydrAMINE (BENADRYL) 50 MG tablet Take 50 mg by mouth daily.    . DOCEtaxel (TAXOTERE IV) Inject into the vein every 21 ( twenty-one) days.    Marland Kitchen loratadine (CLARITIN) 10 MG tablet Take 10 mg by mouth daily.    . magnesium oxide (MAG-OX) 400 (241.3 Mg) MG tablet Take 1 tablet (400 mg total) by mouth 3 (three) times daily. 90 tablet 1  . pertuzumab in sodium chloride 0.9 % 250 mL Inject into the vein every 21 ( twenty-one) days.    . potassium chloride SA (KLOR-CON) 10 MEQ tablet Take 2 tablets (20 mEq total) by  mouth 2 (two) times daily. 120 tablet 3  . prochlorperazine (COMPAZINE) 10 MG tablet Take 1 tablet (10 mg total) by mouth every 6 (six) hours as needed for nausea or vomiting. 30 tablet 3  . trastuzumab-dkst 2 mg/kg in sodium chloride 0.9 % 250 mL Inject into the vein every 21 ( twenty-one) days.     No current facility-administered medications on file prior to visit.    No Known Allergies  Social History   Substance and Sexual Activity  Alcohol Use No    Social History   Tobacco Use  Smoking Status Never Smoker  Smokeless Tobacco Never Used    Review of Systems  Constitutional: Positive for malaise/fatigue.  HENT: Negative.   Eyes: Positive for blurred vision.  Respiratory: Negative.   Cardiovascular: Negative.   Gastrointestinal: Positive for nausea.  Genitourinary: Positive for frequency.  Musculoskeletal: Negative.   Skin: Negative.   Neurological: Negative.   Endo/Heme/Allergies: Negative.   Psychiatric/Behavioral: Negative.     Objective   Vitals:   12/12/19 0858  BP: 104/72  Pulse: 91  Resp: 16  Temp: 98.3 F (36.8 C)  SpO2: 98%    Physical Exam Vitals reviewed.  Constitutional:      Appearance: Normal appearance. She is not ill-appearing.  HENT:     Head: Normocephalic and atraumatic.  Cardiovascular:     Rate and Rhythm: Normal rate and regular rhythm.     Heart sounds: Normal heart sounds. No murmur. No friction rub. No gallop.   Pulmonary:     Effort: Pulmonary effort is normal. No respiratory distress.     Breath sounds: Normal breath sounds. No stridor. No wheezing, rhonchi or rales.  Skin:    General: Skin is warm and dry.  Neurological:     Mental Status: She is alert and oriented to person, place, and time.   Breast: No dominant mass, nipple discharge, or dimpling in either breast.  The Port-A-Cath is in place in the right upper chest.  Axillas are negative for palpable nodes.  Oncology notes reviewed  Assessment  Invasive ductal  carcinoma of right breast, ER +, PR-, HER2 + BRCA 1 + Finishing neoadjuvant chemotherapy Plan   I discussed all the surgical options with her concerning bilateral mastectomy including reconstructive surgery.  She is a candidate for bilateral mastectomy.  She is very interested in undergoing immediate reconstructive surgery.  Have arranged appointment with Dr. Marla Roe of Plastic Surgery for further evaluation and treatment.  She will be seeing [name] of GYN in February for possible TAH-BSO.  I told her to call me should she have any questions.

## 2019-12-12 NOTE — Patient Instructions (Signed)
[facility]  Discharge Instructions:   _______________________________________________________________  Thank you for choosing [facility] to provide your oncology and hematology care.  To afford each patient quality time with our providers, please arrive at least 15 minutes before your scheduled appointment.  You need to re-schedule your appointment if you arrive 10 or more minutes late.  We strive to give you quality time with our providers, and arriving late affects you and other patients whose appointments are after yours.  Also, if you no show three or more times for appointments you may be dismissed from the clinic.  Again, thank you for choosing [facility]. Our hope is that these requests will allow you access to exceptional care and in a timely manner. _______________________________________________________________  If you have questions after your visit, please contact our office at [phone] between the hours of 8:30 a.m. and 5:00 p.m. Voicemails left after 4:30 p.m. will not be returned until the following business day. _______________________________________________________________  For prescription refill requests, have your pharmacy contact our office. _______________________________________________________________  Recommendations made by the consultant and any test results will be sent to your referring physician. _______________________________________________________________ 

## 2019-12-14 NOTE — Assessment & Plan Note (Signed)
1.  Right breast IDC, ER positive, HER-2 positive by FISH: -Mammogram on 07/23/2019 showed lobulated hypodense mass. -Ultrasound-guided biopsy on 08/02/2019 shows invasive mammary carcinoma, E-cadherin, HER-2 negative by IHC, ER 70% positive, PR 0%, Ki-67 80%, HER-2 positive by FISH. -MRI of the breast on 08/12/2019 shows 2.6 x 2.4 x 2.8 cm right breast enhancing mass in the upper outer quadrant of the right breast with no abnormal lymph nodes. -PET scan on 08/15/2019 shows mild metabolic activity associated with right axillary node favored to be reactive.  Intense activity associated with soft tissue posterior to the uterus measuring approximately 3.2 x 2.9 cm, SUV 12.8. -5 cycles of neoadjuvant TCHP from 08/27/2019 through 11/19/2019. -I have reviewed her labs today.  She complained of increased urinary frequency for the past 2 days.  No dysuria. -We checked a UA in our office.  It was negative for infection. -She will proceed with cycle 6 today.  This is her last cycle.  She will come back in 3 weeks for Herceptin and Perjeta. -She will be referred back to Dr. Arnoldo Morale for bilateral mastectomies.  2.  Hypokalemia: -She is taking potassium 20 mEq twice daily. -Her potassium today is 3.4.  She will continue it twice daily.  3.  BRCA1 positivity: -She had my risk myriad panel in 2017 which showed BRCA1 heterozygous mutation positivity. -Increased risk of breast cancer, ovarian and pancreatic cancer was discussed.  I have recommended bilateral mastectomies and oophorectomies. -PET scan showed increased uptake behind the uterus with SUV 12.8, likely ovarian origin. -I will make a referral to [name] for bilateral nephrectomies.  4.  Severe hypomagnesemia: -She is taking magnesium twice daily at home.  Her magnesium today is 1.2. -She will be given 2 g of IV magnesium.  We will increase her magnesium to 3 times a day.  5.  High risk drug monitoring: -Last echo on 08/21/2019 shows ejection  fraction 60 to 65%. -I will obtain another echocardiogram prior to next visit.  We will continue to monitor echocardiograms every 3 months while she is on Herceptin and Pertuzumab.

## 2019-12-27 ENCOUNTER — Other Ambulatory Visit: Payer: Self-pay

## 2019-12-27 ENCOUNTER — Ambulatory Visit (INDEPENDENT_AMBULATORY_CARE_PROVIDER_SITE_OTHER): Payer: Medicaid Other | Admitting: Plastic Surgery

## 2019-12-27 ENCOUNTER — Institutional Professional Consult (permissible substitution): Payer: Medicaid Other | Admitting: Plastic Surgery

## 2019-12-27 ENCOUNTER — Encounter: Payer: Self-pay | Admitting: Plastic Surgery

## 2019-12-27 VITALS — BP 115/82 | HR 78 | Temp 98.2°F | Ht <= 58 in | Wt 138.0 lb

## 2019-12-27 DIAGNOSIS — Z1509 Genetic susceptibility to other malignant neoplasm: Secondary | ICD-10-CM | POA: Diagnosis not present

## 2019-12-27 DIAGNOSIS — Z1501 Genetic susceptibility to malignant neoplasm of breast: Secondary | ICD-10-CM | POA: Diagnosis not present

## 2019-12-27 NOTE — Progress Notes (Signed)
Patient ID: Monica Hancock, female    DOB: [birth year], 39 y.o.   MRN: [id number]   Chief Complaint  Patient presents with  . Breast Cancer    The patient is a 39 year old female here with her sister for a consultation for breast reconstruction.  She was concerned about her breasts and her positive family history of breast cancer.  She underwent a work-up which revealed right breast cancer.  Her genetics came back positive for BRCA.  Her lymph nodes appear to be negative at this point in time.  She has completed her chemotherapy as of December 10, 1999.  She is planning on bilateral mastectomies and was referred by Dr. Arnoldo Morale.  She is 4 feet 8 inches tall and weighs 138 pounds.  Her preoperative bra size is a 36B.  She is not a smoker.  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  She has 4 children.  The chart indicates she has had a tubal ligation.   Review of Systems  Constitutional: Positive for activity change and appetite change.  HENT: Negative.   Eyes: Negative.   Respiratory: Negative.   Cardiovascular: Negative for leg swelling.  Gastrointestinal: Negative for abdominal distention and abdominal pain.  Endocrine: Negative.   Genitourinary: Negative.   Musculoskeletal: Negative.   Neurological: Negative.   Psychiatric/Behavioral: Negative.     Past Medical History:  Diagnosis Date  . BRCA1 positive   . Family history of BRCA1 gene positive   . Family history of breast cancer   . H/O Bell's palsy 2000   Left sided  . HSV infection     Past Surgical History:  Procedure Laterality Date  . IR IMAGING GUIDED PORT INSERTION  08/26/2019  . TUBAL LIGATION        Current Outpatient Medications:  .  CARBOPLATIN IV, Inject into the vein every 21 ( twenty-one) days., Disp: , Rfl:  .  diphenhydrAMINE (BENADRYL) 50 MG tablet, Take 50 mg by mouth daily., Disp: , Rfl:  .  DOCEtaxel (TAXOTERE IV), Inject into  the vein every 21 ( twenty-one) days., Disp: , Rfl:  .  loratadine (CLARITIN) 10 MG tablet, Take 10 mg by mouth daily., Disp: , Rfl:  .  magnesium oxide (MAG-OX) 400 (241.3 Mg) MG tablet, Take 1 tablet (400 mg total) by mouth 3 (three) times daily., Disp: 90 tablet, Rfl: 1 .  pertuzumab in sodium chloride 0.9 % 250 mL, Inject into the vein every 21 ( twenty-one) days., Disp: , Rfl:  .  potassium chloride SA (KLOR-CON) 10 MEQ tablet, Take 2 tablets (20 mEq total) by mouth 2 (two) times daily., Disp: 120 tablet, Rfl: 3 .  prochlorperazine (COMPAZINE) 10 MG tablet, Take 1 tablet (10 mg total) by mouth every 6 (six) hours as needed for nausea or vomiting., Disp: 30 tablet, Rfl: 3 .  trastuzumab-dkst 2 mg/kg in sodium chloride 0.9 % 250 mL, Inject into the vein every 21 ( twenty-one) days., Disp: , Rfl:    Objective:   Vitals:   12/27/19 1051  BP: 115/82  Pulse: 78  Temp: 98.2 F (36.8 C)  SpO2: 100%    Physical Exam Vitals and nursing note reviewed.  Constitutional:      Appearance: Normal appearance.  HENT:     Head: Normocephalic and atraumatic.  Eyes:     Extraocular Movements: Extraocular movements intact.  Cardiovascular:     Rate and  Rhythm: Normal rate.  Pulmonary:     Effort: Pulmonary effort is normal.  Abdominal:     General: Abdomen is flat. There is no distension.     Tenderness: There is no abdominal tenderness.  Skin:    General: Skin is warm.     Capillary Refill: Capillary refill takes less than 2 seconds.  Neurological:     General: No focal deficit present.     Mental Status: She is alert and oriented to person, place, and time.  Psychiatric:        Mood and Affect: Mood normal.        Behavior: Behavior normal.        Thought Content: Thought content normal.     Assessment & Plan:  BRCA1 positive  We had a detailed conversation about the patient's options for breast reconstruction. Several reconstruction options were explained to the patient.  It is  important to remember that breast reconstruction is an optional procedure. Reconstruction often requires several stages of surgery and this means more than one operation.  The surgeries are often done several months apart.  The entire process from start to finish can take a year or more. The major goal of breast reconstruction is to look normal in clothing. There will always be scars and a difference noticeable without clothes.  This is true for asymmetries where both breasts will not be identical.  Surgery may be needed or desired to the non-cancerous breast in order to achieve better symmetry and satisfactory results.  Regardless of the reconstructive method, there is always risks and the possibility that the procedure will fail or have complications.  This couls required additional surgeries.    We discussed the available methods of breast reconstruction and included:  1. Tissue expander with Acellular dermal matrix followed by implant based reconstruction. This can be done as one surgery or multiple surgeries.  2. Autologous reconstruction can include using a muscle or tissue from another area of the body for the reconstruction.  3. Combined procedures like the latissismus dorsi flaps that often uses the muscle with an expander or implant.  For each of the method discussed the risks, benefits, scars and recovery time were discussed in detail. Specific risks included bleeding, infection, hematoma, seroma, scarring, pain, wound healing complications, flap loss, fat necrosis, capsular contracture, need for implant removal, donor site complications, bulge, hernia, umbilical necrosis, need for urgent reoperation, and need for dressing changes.   After the options were discussed we focused on the patient's desires and the procedure that was best for her based on all the information.  A total of 50 minutes of face-to-face time was spent in this encounter, of which >50% was spent in counseling.    The  patient's original idea was for autologous reconstruction.  After hearing all of the options she has decided on breast reconstruction with expander and Flex HD placement followed by implant-based reconstruction.  We discussed all the above information.  Due to the logistics of the location of Dr. Arnoldo Morale I will refer her to [facility] for the mastectomy.   Pictures were obtained of the patient and placed in the chart with the patient's or guardian's permission.  Wallace Going, DO   The 21st Century Cures Act was signed into law in 2016 which includes the topic of electronic health records.  This provides immediate access to information in MyChart.  This includes consultation notes, operative notes, office notes, lab results and pathology reports.  If you  have any questions about what you read please let us know at your next visit or call us at the office.  We are right here with you.

## 2019-12-30 ENCOUNTER — Ambulatory Visit (HOSPITAL_COMMUNITY)
Admission: RE | Admit: 2019-12-30 | Discharge: 2019-12-30 | Disposition: A | Discharge status: Home / Self Care | Payer: Medicaid Other | Source: Ambulatory Visit | Attending: Hematology | Admitting: Hematology

## 2019-12-30 ENCOUNTER — Other Ambulatory Visit: Payer: Self-pay

## 2019-12-30 DIAGNOSIS — C50411 Malignant neoplasm of upper-outer quadrant of right female breast: Secondary | ICD-10-CM

## 2019-12-30 NOTE — Progress Notes (Signed)
*  PRELIMINARY RESULTS* Echocardiogram 2D Echocardiogram has been performed.  Monica Hancock 12/30/2019, 9:38 AM

## 2019-12-31 ENCOUNTER — Encounter: Payer: Self-pay | Admitting: Obstetrics and Gynecology

## 2019-12-31 ENCOUNTER — Ambulatory Visit (INDEPENDENT_AMBULATORY_CARE_PROVIDER_SITE_OTHER): Payer: Medicaid Other | Admitting: Obstetrics and Gynecology

## 2019-12-31 ENCOUNTER — Other Ambulatory Visit: Payer: Self-pay

## 2019-12-31 VITALS — BP 107/73 | HR 89 | Ht <= 58 in | Wt 144.6 lb

## 2019-12-31 DIAGNOSIS — Z1509 Genetic susceptibility to other malignant neoplasm: Secondary | ICD-10-CM | POA: Diagnosis not present

## 2019-12-31 DIAGNOSIS — Z1501 Genetic susceptibility to malignant neoplasm of breast: Secondary | ICD-10-CM

## 2019-12-31 NOTE — Progress Notes (Signed)
Patient ID: Monica Hancock, female   DOB: 14-Jul-1981, 39 y.o.   MRN: [id number]   [facility] Visit  @DATE @            Patient name: Monica Hancock MRN [id number]  Date of birth: Apr 21, 1981  CC & HPI:  Monica Hancock is a 39 y.o. female presenting today for discussion of salpingo-oophorectomy. 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 When she was diagnosed with breast cancer she decided to leave. She tested positive for BRCA1 gene mutation. Her youngest in 41 years old. She was speaking with Audelia Hives for breast reconstruction. She plans for double mastectomy.   She is still have periods, but hasn't had a period in the past 2 months.   ROS:  ROS   Pertinent History Reviewed:   Reviewed: Significant for  Medical         Past Medical History:  Diagnosis Date  . BRCA1 positive   . Family history of BRCA1 gene positive   . Family history of breast cancer   . H/O Bell's palsy 2000   Left sided  . HSV infection                               Surgical Hx:    Past Surgical History:  Procedure Laterality Date  . IR IMAGING GUIDED PORT INSERTION  08/26/2019  . TUBAL LIGATION     Medications: Reviewed & Updated - see associated section                       Current Outpatient Medications:  .  CARBOPLATIN IV, Inject into the vein every 21 ( twenty-one) days., Disp: , Rfl:  .  diphenhydrAMINE (BENADRYL) 50 MG tablet, Take 50 mg by mouth daily., Disp: , Rfl:  .  DOCEtaxel (TAXOTERE IV), Inject into the vein every 21 ( twenty-one) days., Disp: , Rfl:  .  loratadine (CLARITIN) 10 MG tablet, Take 10 mg by mouth daily., Disp: , Rfl:  .  magnesium oxide (MAG-OX) 400 (241.3 Mg) MG tablet, Take 1 tablet (400 mg total) by mouth 3 (three) times daily., Disp: 90 tablet, Rfl: 1 .  pertuzumab in sodium chloride 0.9 % 250 mL, Inject into the vein every 21 ( twenty-one) days., Disp: , Rfl:  .  potassium chloride SA (KLOR-CON) 10 MEQ tablet, Take 2  tablets (20 mEq total) by mouth 2 (two) times daily., Disp: 120 tablet, Rfl: 3 .  prochlorperazine (COMPAZINE) 10 MG tablet, Take 1 tablet (10 mg total) by mouth every 6 (six) hours as needed for nausea or vomiting., Disp: 30 tablet, Rfl: 3 .  trastuzumab-dkst 2 mg/kg in sodium chloride 0.9 % 250 mL, Inject into the vein every 21 ( twenty-one) days., Disp: , Rfl:    Social History: Reviewed -  reports that she has never smoked. She has never used smokeless tobacco.  Objective Findings:  Vitals: currently breastfeeding.  PHYSICAL EXAMINATION General appearance - alert, well appearing, and in no distress Mental status - normal mood, behavior, speech, dress, motor activity, and thought processes, affect appropriate to mood  PELVIC Discussion only   Assessment & Plan:   A:  1.  Discussion of salpingo-oophorectomy  P:  1. Coordinate with Dr. Marla Roe for future plan of care 2. Discuss w/ Dr. Marla Roe TLH (refer) vs LAVHBSO. 3. 1 week exam and follow up discussion  By signing my name below, I, Samul Dada, attest that this documentation has been prepared under the direction and in the presence of [name], MD. Electronically Signed: [place]. 12/31/19. 3:44 PM.  I personally performed the services described in this documentation, which was SCRIBED in my presence. The recorded information has been reviewed and considered accurate. It has been edited as necessary during review. [name], MD

## 2020-01-01 ENCOUNTER — Other Ambulatory Visit: Payer: Self-pay

## 2020-01-01 ENCOUNTER — Ambulatory Visit: Payer: Medicaid Other | Admitting: Family Medicine

## 2020-01-01 ENCOUNTER — Encounter: Payer: Self-pay | Admitting: Family Medicine

## 2020-01-01 VITALS — BP 115/70 | HR 81 | Temp 98.5°F | Ht <= 58 in | Wt 146.0 lb

## 2020-01-01 DIAGNOSIS — R0789 Other chest pain: Secondary | ICD-10-CM | POA: Diagnosis not present

## 2020-01-01 DIAGNOSIS — M25512 Pain in left shoulder: Secondary | ICD-10-CM | POA: Diagnosis not present

## 2020-01-01 MED ORDER — CYCLOBENZAPRINE HCL 10 MG PO TABS: ORAL_TABLET | ORAL | 0 refills | Status: DC

## 2020-01-01 NOTE — Patient Instructions (Signed)
astraglide-trial Chest xray, left shoulder xray Flexeril-take 1/2 to 1 at night

## 2020-01-01 NOTE — Progress Notes (Signed)
Established Patient Office Visit  Subjective:  Patient ID: Monica Hancock, female    DOB: 1981/09/30  Age: 39 y.o. MRN: [id number]  CC:  Chief Complaint  Patient presents with  . Left arm pain    pain radiates in to her chest /had echo [place]     HPI [name] presents for left arm pain-radiated into the left chest. No injury. Infiltrating ductal CA. [name] and recommendation for salpingo- oophorectomy. Pt will have double mastectomy. Right breast-ductal CA-oncology following  Past Medical History:  Diagnosis Date  . BRCA1 positive   . Family history of BRCA1 gene positive   . Family history of breast cancer   . H/O Bell's palsy 2000   Left sided  . HSV infection     Past Surgical History:  Procedure Laterality Date  . IR IMAGING GUIDED PORT INSERTION  08/26/2019  . TUBAL LIGATION      Family History  Problem Relation Age of Onset  . Diabetes Mother   . Breast cancer Mother 5  . Cancer Mother   . Diabetes Maternal Grandmother   . Stomach cancer Maternal Grandfather   . Breast cancer Maternal Aunt        dx in her 22s  . Diabetes Maternal Aunt     Social History   Socioeconomic History  . Marital status: Married    Spouse name: [name]  . Number of children: 5  . Years of education: Not on file  . Highest education level: 12th grade  Occupational History  . Not on file  Tobacco Use  . Smoking status: Never Smoker  . Smokeless tobacco: Never Used  Substance and Sexual Activity  . Alcohol use: No  . Drug use: No  . Sexual activity: Yes    Birth control/protection: Surgical    Comment: tubal  Other Topics Concern  . Not on file  Social History Narrative  . Not on file   Social Determinants of Health   Financial Resource Strain: Medium Risk  . Difficulty of Paying Living Expenses: Somewhat hard  Food Insecurity: Food Insecurity Present  . Worried About Charity fundraiser in the Last Year: Sometimes true  . Ran Out of Food in  the Last Year: Sometimes true  Transportation Needs: No Transportation Needs  . Lack of Transportation (Medical): No  . Lack of Transportation (Non-Medical): No  Physical Activity: Inactive  . Days of Exercise per Week: 0 days  . Minutes of Exercise per Session: 0 min  Stress: No Stress Concern Present  . Feeling of Stress : Only a little  Social Connections: Not Isolated  . Frequency of Communication with Friends and Family: More than three times a week  . Frequency of Social Gatherings with Friends and Family: More than three times a week  . Attends Religious Services: More than 4 times per year  . Active Member of Clubs or Organizations: Yes  . Attends Archivist Meetings: More than 4 times per year  . Marital Status: Married  Human resources officer Violence: Not At Risk  . Fear of Current or Ex-Partner: No  . Emotionally Abused: No  . Physically Abused: No  . Sexually Abused: No    Outpatient Medications Prior to Visit  Medication Sig Dispense Refill  . CARBOPLATIN IV Inject into the vein every 21 ( twenty-one) days.    . diphenhydrAMINE (BENADRYL) 50 MG tablet Take 50 mg by mouth daily.    . DOCEtaxel (TAXOTERE IV)  Inject into the vein every 21 ( twenty-one) days.    Marland Kitchen loratadine (CLARITIN) 10 MG tablet Take 10 mg by mouth daily.    . magnesium oxide (MAG-OX) 400 (241.3 Mg) MG tablet Take 1 tablet (400 mg total) by mouth 3 (three) times daily. 90 tablet 1  . pertuzumab in sodium chloride 0.9 % 250 mL Inject into the vein every 21 ( twenty-one) days.    . potassium chloride SA (KLOR-CON) 10 MEQ tablet Take 2 tablets (20 mEq total) by mouth 2 (two) times daily. 120 tablet 3  . prochlorperazine (COMPAZINE) 10 MG tablet Take 1 tablet (10 mg total) by mouth every 6 (six) hours as needed for nausea or vomiting. 30 tablet 3  . trastuzumab-dkst 2 mg/kg in sodium chloride 0.9 % 250 mL Inject into the vein every 21 ( twenty-one) days.     No facility-administered medications prior  to visit.    No Known Allergies  ROS Review of Systems  Genitourinary:       Vaginal dryness   Musculoskeletal: Positive for arthralgias and myalgias.       Left shoulder pain radiating into the lett neck and chest wall      Objective:    Physical Exam  Constitutional: She is oriented to person, place, and time. She appears well-developed and well-nourished. She appears distressed.  Cardiovascular: Normal rate, regular rhythm and normal heart sounds.  Pulmonary/Chest: Effort normal and breath sounds normal.  Musculoskeletal:        General: Tenderness present. Normal range of motion.  Neurological: She is oriented to person, place, and time.  Psychiatric:  Crying with discussion    BP 115/70 (BP Location: Right Arm, Patient Position: Sitting, Cuff Size: Normal)   Pulse 81   Temp 98.5 F (36.9 C) (Oral)   Ht 4' 8" (1.422 m)   Wt 146 lb (66.2 kg)   SpO2 99%   BMI 32.73 kg/m  Wt Readings from Last 3 Encounters:  01/01/20 146 lb (66.2 kg)  12/31/19 144 lb 9.6 oz (65.6 kg)  12/27/19 138 lb (62.6 kg)    Lab Results  Component Value Date   TSH 1.120 08/06/2014   Lab Results  Component Value Date   WBC 4.8 12/10/2019   HGB 8.9 (L) 12/10/2019   HCT 28.2 (L) 12/10/2019   MCV 102.5 (H) 12/10/2019   PLT 176 12/10/2019   Lab Results  Component Value Date   NA 141 12/10/2019   K 3.4 (L) 12/10/2019   CO2 25 12/10/2019   GLUCOSE 104 (H) 12/10/2019   BUN 16 12/10/2019   CREATININE 0.54 12/10/2019   BILITOT 0.5 12/10/2019   ALKPHOS 67 12/10/2019   AST 22 12/10/2019   ALT 22 12/10/2019   PROT 7.2 12/10/2019   ALBUMIN 3.7 12/10/2019   CALCIUM 9.3 12/10/2019   ANIONGAP 8 12/10/2019   Lab Results  Component Value Date   CHOL 183 09/17/2018   Lab Results  Component Value Date   HDL 52 09/17/2018   Lab Results  Component Value Date   LDLCALC 117 (H) 09/17/2018   Lab Results  Component Value Date   TRIG 70 09/17/2018   Lab Results  Component Value Date    CHOLHDL 3.5 09/17/2018   Lab Results  Component Value Date   HGBA1C 5.3 11/13/2019      Assessment & Plan:  1. Acute pain of left shoulder Pt states no injury-pt had echo evaluation.  - DG Chest 2 View; Future - DG Shoulder  Left; Future  2. Chest wall pain - DG Chest 2 View; Future Flexeril-risk/benefit/side effect d/w pt Follow-up:   astraglide trial for vaginal dryness 45 minute discussion about diagnosis, surgical recommendations, pre-mature menopausal symptoms Jaison Petraglia Hannah Beat, MD

## 2020-01-02 ENCOUNTER — Encounter (HOSPITAL_COMMUNITY): Payer: Self-pay

## 2020-01-02 ENCOUNTER — Other Ambulatory Visit (HOSPITAL_COMMUNITY): Payer: Self-pay | Admitting: *Deleted

## 2020-01-02 ENCOUNTER — Inpatient Hospital Stay (HOSPITAL_BASED_OUTPATIENT_CLINIC_OR_DEPARTMENT_OTHER): Payer: Medicaid Other | Admitting: Hematology

## 2020-01-02 ENCOUNTER — Ambulatory Visit (HOSPITAL_COMMUNITY)
Admission: RE | Admit: 2020-01-02 | Discharge: 2020-01-02 | Disposition: A | Discharge status: Home / Self Care | Payer: Medicaid Other | Source: Ambulatory Visit | Attending: Hematology | Admitting: Hematology

## 2020-01-02 ENCOUNTER — Inpatient Hospital Stay (HOSPITAL_COMMUNITY): Payer: Medicaid Other | Attending: Hematology

## 2020-01-02 ENCOUNTER — Encounter (HOSPITAL_COMMUNITY): Payer: Self-pay | Admitting: Hematology

## 2020-01-02 ENCOUNTER — Inpatient Hospital Stay (HOSPITAL_COMMUNITY): Payer: Medicaid Other

## 2020-01-02 VITALS — BP 97/56 | HR 74 | Temp 97.3°F | Resp 18

## 2020-01-02 VITALS — BP 96/63 | HR 92 | Temp 97.1°F | Resp 18 | Wt 147.2 lb

## 2020-01-02 DIAGNOSIS — Z5112 Encounter for antineoplastic immunotherapy: Secondary | ICD-10-CM | POA: Insufficient documentation

## 2020-01-02 DIAGNOSIS — C50411 Malignant neoplasm of upper-outer quadrant of right female breast: Secondary | ICD-10-CM | POA: Insufficient documentation

## 2020-01-02 DIAGNOSIS — M79602 Pain in left arm: Secondary | ICD-10-CM

## 2020-01-02 DIAGNOSIS — R197 Diarrhea, unspecified: Secondary | ICD-10-CM | POA: Diagnosis not present

## 2020-01-02 DIAGNOSIS — Z79899 Other long term (current) drug therapy: Secondary | ICD-10-CM | POA: Insufficient documentation

## 2020-01-02 LAB — CBC WITH DIFFERENTIAL/PLATELET
Abs Immature Granulocytes: 0.01 10*3/uL (ref 0.00–0.07)
Basophils Absolute: 0 10*3/uL (ref 0.0–0.1)
Basophils Relative: 0 %
Eosinophils Absolute: 0 10*3/uL (ref 0.0–0.5)
Eosinophils Relative: 0 %
HCT: 27.7 % — ABNORMAL LOW (ref 36.0–46.0)
Hemoglobin: 8.7 g/dL — ABNORMAL LOW (ref 12.0–15.0)
Immature Granulocytes: 0 %
Lymphocytes Relative: 38 %
Lymphs Abs: 1.7 10*3/uL (ref 0.7–4.0)
MCH: 33.2 pg (ref 26.0–34.0)
MCHC: 31.4 g/dL (ref 30.0–36.0)
MCV: 105.7 fL — ABNORMAL HIGH (ref 80.0–100.0)
Monocytes Absolute: 0.4 10*3/uL (ref 0.1–1.0)
Monocytes Relative: 8 %
Neutro Abs: 2.4 10*3/uL (ref 1.7–7.7)
Neutrophils Relative %: 54 %
Platelets: 144 10*3/uL — ABNORMAL LOW (ref 150–400)
RBC: 2.62 MIL/uL — ABNORMAL LOW (ref 3.87–5.11)
RDW: 16.6 % — ABNORMAL HIGH (ref 11.5–15.5)
WBC: 4.5 10*3/uL (ref 4.0–10.5)
nRBC: 0 % (ref 0.0–0.2)

## 2020-01-02 LAB — COMPREHENSIVE METABOLIC PANEL
ALT: 22 U/L (ref 0–44)
AST: 25 U/L (ref 15–41)
Albumin: 3.5 g/dL (ref 3.5–5.0)
Alkaline Phosphatase: 59 U/L (ref 38–126)
Anion gap: 5 (ref 5–15)
BUN: 11 mg/dL (ref 6–20)
CO2: 26 mmol/L (ref 22–32)
Calcium: 9.3 mg/dL (ref 8.9–10.3)
Chloride: 109 mmol/L (ref 98–111)
Creatinine, Ser: 0.64 mg/dL (ref 0.44–1.00)
GFR calc Af Amer: >60 mL/min (ref >60)
GFR calc non Af Amer: >60 mL/min (ref >60)
Glucose, Bld: 116 mg/dL — ABNORMAL HIGH (ref 70–99)
Potassium: 3.3 mmol/L — ABNORMAL LOW (ref 3.5–5.1)
Sodium: 140 mmol/L (ref 135–145)
Total Bilirubin: 0.6 mg/dL (ref 0.3–1.2)
Total Protein: 6.4 g/dL — ABNORMAL LOW (ref 6.5–8.1)

## 2020-01-02 LAB — MAGNESIUM: Magnesium: 1.3 mg/dL — ABNORMAL LOW (ref 1.7–2.4)

## 2020-01-02 MED ORDER — DIPHENHYDRAMINE HCL 25 MG PO CAPS
ORAL_CAPSULE | ORAL | Status: AC
  Filled 2020-01-02: qty 1

## 2020-01-02 MED ORDER — HEPARIN SOD (PORK) LOCK FLUSH 100 UNIT/ML IV SOLN
500.0000 [IU] | Freq: Once | INTRAVENOUS | Status: AC | PRN
  Administered 2020-01-02: 500 [IU]

## 2020-01-02 MED ORDER — SODIUM CHLORIDE 0.9 % IV SOLN: Freq: Once | INTRAVENOUS | Status: AC

## 2020-01-02 MED ORDER — ACETAMINOPHEN 325 MG PO TABS
ORAL_TABLET | ORAL | Status: AC
  Filled 2020-01-02: qty 2

## 2020-01-02 MED ORDER — SODIUM CHLORIDE 0.9 % IV SOLN
420.0000 mg | Freq: Once | INTRAVENOUS | Status: AC
  Administered 2020-01-02: 420 mg via INTRAVENOUS
  Filled 2020-01-02: qty 14

## 2020-01-02 MED ORDER — ACETAMINOPHEN 325 MG PO TABS
650.0000 mg | ORAL_TABLET | Freq: Once | ORAL | Status: AC
  Administered 2020-01-02: 650 mg via ORAL

## 2020-01-02 MED ORDER — DIPHENHYDRAMINE HCL 25 MG PO CAPS
50.0000 mg | ORAL_CAPSULE | Freq: Once | ORAL | Status: AC
  Administered 2020-01-02: 11:00:00 25 mg via ORAL

## 2020-01-02 MED ORDER — SODIUM CHLORIDE 0.9% FLUSH
10.0000 mL | INTRAVENOUS | Status: DC | PRN
  Administered 2020-01-02: 10 mL

## 2020-01-02 MED ORDER — TRASTUZUMAB-DKST CHEMO 150 MG IV SOLR
450.0000 mg | Freq: Once | INTRAVENOUS | Status: AC
  Administered 2020-01-02: 450 mg via INTRAVENOUS
  Filled 2020-01-02: qty 21.43

## 2020-01-02 MED ORDER — MAGNESIUM SULFATE 2 GM/50ML IV SOLN
2.0000 g | Freq: Once | INTRAVENOUS | Status: AC
  Administered 2020-01-02: 2 g via INTRAVENOUS
  Filled 2020-01-02: qty 50

## 2020-01-02 NOTE — Patient Instructions (Signed)
[facility] °Discharge Instructions for Patients Receiving Chemotherapy ° °Today you received the following chemotherapy agents  ° °To help prevent nausea and vomiting after your treatment, we encourage you to take your nausea medication  °  °If you develop nausea and vomiting that is not controlled by your nausea medication, call the clinic.  ° °BELOW ARE SYMPTOMS THAT SHOULD BE REPORTED IMMEDIATELY: °· *FEVER GREATER THAN 100.5 F °· *CHILLS WITH OR WITHOUT FEVER °· NAUSEA AND VOMITING THAT IS NOT CONTROLLED WITH YOUR NAUSEA MEDICATION °· *UNUSUAL SHORTNESS OF BREATH °· *UNUSUAL BRUISING OR BLEEDING °· TENDERNESS IN MOUTH AND THROAT WITH OR WITHOUT PRESENCE OF ULCERS °· *URINARY PROBLEMS °· *BOWEL PROBLEMS °· UNUSUAL RASH °Items with * indicate a potential emergency and should be followed up as soon as possible. ° °Feel free to call the clinic should you have any questions or concerns. The clinic phone number is [phone]. ° °Please show the CHEMO ALERT CARD at check-in to the Emergency Department and triage nurse. ° ° °

## 2020-01-02 NOTE — Progress Notes (Signed)
Patient has been assessed, vital signs and labs have been reviewed by Dr. Delton Coombes. ANC, Creatinine, LFTs, and Platelets are within treatment parameters per Dr. Delton Coombes. The patient is good to proceed with treatment at this time. Please also check a magnesium level, orders are in and released.

## 2020-01-02 NOTE — Progress Notes (Signed)
01/02/20  Magnesium 1.3 - give magnesium sulfate 2 gm IVPB x 1 dose today  Decrease diphenhydramine to 25 mg po as premedication per patient request.  T.O. Dr Rhys Martini, PharmD

## 2020-01-02 NOTE — Progress Notes (Signed)
Monica Hancock, [place] [address]   CLINIC:  Medical Oncology/Hematology  PCP:  Monica Hancock, MD Monica Hancock [phone]   REASON FOR VISIT:  HER-2 positive right breast cancer  CURRENT THERAPY: Maintenance Herceptin and Pertuzumab.    INTERVAL HISTORY:  [name] 39 y.o. female seen for follow-up of right breast cancer.  She finished her cycle 6 on 12/10/2019.  She had episodes of diarrhea.  She also reported left upper extremity pain rated as 5 out of 10, dull aching pain.  She also had some pain in the left posterior chest wall.  Appetite and energy levels are 100%.  Denies any fevers or infections.  No signs or symptoms of PND or orthopnea.   REVIEW OF SYSTEMS:  Review of Systems  Gastrointestinal: Positive for diarrhea.  All other systems reviewed and are negative.    PAST MEDICAL/SURGICAL HISTORY:  Past Medical History:  Diagnosis Date  . BRCA1 positive   . Family history of BRCA1 gene positive   . Family history of breast cancer   . H/O Bell's palsy 2000   Left sided  . HSV infection    Past Surgical History:  Procedure Laterality Date  . IR IMAGING GUIDED PORT INSERTION  08/26/2019  . TUBAL LIGATION       SOCIAL HISTORY:  Social History   Socioeconomic History  . Marital status: Married    Spouse name: [name]  . Number of children: 5  . Years of education: Not on file  . Highest education level: 12th grade  Occupational History  . Not on file  Tobacco Use  . Smoking status: Never Smoker  . Smokeless tobacco: Never Used  Substance and Sexual Activity  . Alcohol use: No  . Drug use: No  . Sexual activity: Yes    Birth control/protection: Surgical    Comment: tubal  Other Topics Concern  . Not on file  Social History Narrative  . Not on file   Social Determinants of Health   Financial Resource Strain: Medium Risk  . Difficulty of Paying Living Expenses: Somewhat hard  Food  Insecurity: Food Insecurity Present  . Worried About Charity fundraiser in the Last Year: Sometimes true  . Ran Out of Food in the Last Year: Sometimes true  Transportation Needs: No Transportation Needs  . Lack of Transportation (Medical): No  . Lack of Transportation (Non-Medical): No  Physical Activity: Inactive  . Days of Exercise per Week: 0 days  . Minutes of Exercise per Session: 0 min  Stress: No Stress Concern Present  . Feeling of Stress : Only a little  Social Connections: Not Isolated  . Frequency of Communication with Friends and Family: More than three times a week  . Frequency of Social Gatherings with Friends and Family: More than three times a week  . Attends Religious Services: More than 4 times per year  . Active Member of Clubs or Organizations: Yes  . Attends Archivist Meetings: More than 4 times per year  . Marital Status: Married  Human resources officer Violence: Not At Risk  . Fear of Current or Ex-Partner: No  . Emotionally Abused: No  . Physically Abused: No  . Sexually Abused: No    FAMILY HISTORY:  Family History  Problem Relation Age of Onset  . Diabetes Mother   . Breast cancer Mother 28  . Cancer Mother   . Diabetes Maternal Grandmother   .  Stomach cancer Maternal Grandfather   . Breast cancer Maternal Aunt        dx in her 69s  . Diabetes Maternal Aunt     CURRENT MEDICATIONS:  Outpatient Encounter Medications as of 01/02/2020  Medication Sig  . CARBOPLATIN IV Inject into the vein every 21 ( twenty-one) days.  . cyclobenzaprine (FLEXERIL) 10 MG tablet 1/2 po qhs  . diphenhydrAMINE (BENADRYL) 50 MG tablet Take 50 mg by mouth daily.  . DOCEtaxel (TAXOTERE IV) Inject into the vein every 21 ( twenty-one) days.  Marland Kitchen loratadine (CLARITIN) 10 MG tablet Take 10 mg by mouth daily.  . magnesium oxide (MAG-OX) 400 (241.3 Mg) MG tablet Take 1 tablet (400 mg total) by mouth 3 (three) times daily.  . pertuzumab in sodium chloride 0.9 % 250 mL Inject  into the vein every 21 ( twenty-one) days.  . potassium chloride SA (KLOR-CON) 10 MEQ tablet Take 2 tablets (20 mEq total) by mouth 2 (two) times daily.  . prochlorperazine (COMPAZINE) 10 MG tablet Take 1 tablet (10 mg total) by mouth every 6 (six) hours as needed for nausea or vomiting.  . trastuzumab-dkst 2 mg/kg in sodium chloride 0.9 % 250 mL Inject into the vein every 21 ( twenty-one) days.   No facility-administered encounter medications on file as of 01/02/2020.    ALLERGIES:  No Known Allergies   PHYSICAL EXAM:  ECOG Performance status: 0  Vitals:   01/02/20 0906  BP: 96/63  Pulse: 92  Resp: 18  Temp: (!) 97.1 F (36.2 C)  SpO2: 100%   There were no vitals filed for this visit.  Physical Exam Vitals reviewed.  Constitutional:      Appearance: Normal appearance.  Cardiovascular:     Rate and Rhythm: Normal rate and regular rhythm.     Heart sounds: Normal heart sounds.  Pulmonary:     Effort: Pulmonary effort is normal.     Breath sounds: Normal breath sounds.  Abdominal:     General: There is no distension.     Palpations: Abdomen is soft. There is no mass.  Musculoskeletal:        General: No swelling.  Lymphadenopathy:     Cervical: No cervical adenopathy.  Skin:    General: Skin is warm.  Neurological:     General: No focal deficit present.     Mental Status: She is alert and oriented to person, place, and time.  Psychiatric:        Mood and Affect: Mood normal.        Behavior: Behavior normal.    LABORATORY DATA:  I have reviewed the labs as listed.  CBC    Component Value Date/Time   WBC 4.8 12/10/2019 0823   RBC 2.75 (L) 12/10/2019 0823   HGB 8.9 (L) 12/10/2019 0823   HCT 28.2 (L) 12/10/2019 0823   PLT 176 12/10/2019 0823   MCV 102.5 (H) 12/10/2019 0823   MCH 32.4 12/10/2019 0823   MCHC 31.6 12/10/2019 0823   RDW 19.9 (H) 12/10/2019 0823   LYMPHSABS 1.8 12/10/2019 0823   MONOABS 0.6 12/10/2019 0823   EOSABS 0.0 12/10/2019 0823    BASOSABS 0.0 12/10/2019 0823   CMP Latest Ref Rng & Units 12/10/2019 11/19/2019 10/29/2019  Glucose 70 - 99 mg/dL 104(H) 115(H) 142(H)  BUN 6 - 20 mg/dL 16 11 9   Creatinine 0.44 - 1.00 mg/dL 0.54 0.59 0.65  Sodium 135 - 145 mmol/L 141 141 140  Potassium 3.5 - 5.1 mmol/L 3.4(L)  3.2(L) 3.5  Chloride 98 - 111 mmol/L 108 106 103  CO2 22 - 32 mmol/L 25 24 25   Calcium 8.9 - 10.3 mg/dL 9.3 9.6 9.5  Total Protein 6.5 - 8.1 g/dL 7.2 7.1 7.2  Total Bilirubin 0.3 - 1.2 mg/dL 0.5 0.4 0.7  Alkaline Phos 38 - 126 U/L 67 62 74  AST 15 - 41 U/L 22 28 33  ALT 0 - 44 U/L 22 34 35       DIAGNOSTIC IMAGING:  I have independently reviewed the scans and discussed with the patient.    ASSESSMENT & PLAN:   No problem-specific Assessment & Plan notes found for this encounter.   Orders placed this encounter:  No orders of the defined types were placed in this encounter.     Derek Jack, MD [place] [phone]

## 2020-01-02 NOTE — Progress Notes (Signed)
Labs reviewed with MD today. Proceed with treatment as planned.  Magnesium infusion given today per orders.  Treatment given per orders. Patient tolerated it well without problems. Vitals stable and discharged home from clinic ambulatory. Follow up as scheduled.

## 2020-01-02 NOTE — Patient Instructions (Addendum)
[place] at [facility] Discharge Instructions  You were seen today by Dr. Delton Coombes. He went over your recent lab results. He will schedule you for a bone scan. Continue treatment every 3 weeks. He will follow up by phone with results of bone scan. He will see you back in 6 weeks for labs treatment and follow up.   Thank you for choosing [place] at [facility] to provide your oncology and hematology care.  To afford each patient quality time with our provider, please arrive at least 15 minutes before your scheduled appointment time.   If you have a lab appointment with the [facility] please come in thru the  Main Entrance and check in at the main information desk  You need to re-schedule your appointment should you arrive 10 or more minutes late.  We strive to give you quality time with our providers, and arriving late affects you and other patients whose appointments are after yours.  Also, if you no show three or more times for appointments you may be dismissed from the clinic at the providers discretion.     Again, thank you for choosing [facility].  Our hope is that these requests will decrease the amount of time that you wait before being seen by our physicians.       _____________________________________________________________  Should you have questions after your visit to [facility], please contact our office at [phone] between the hours of 8:00 a.m. and 4:30 p.m.  Voicemails left after 4:00 p.m. will not be returned until the following business day.  For prescription refill requests, have your pharmacy contact our office and allow 72 hours.    Cancer Center Support Programs:   > Cancer Support Group  2nd Tuesday of the month 1pm-2pm, Journey Room

## 2020-01-03 ENCOUNTER — Other Ambulatory Visit (HOSPITAL_COMMUNITY): Payer: Self-pay | Admitting: *Deleted

## 2020-01-03 DIAGNOSIS — C50411 Malignant neoplasm of upper-outer quadrant of right female breast: Secondary | ICD-10-CM

## 2020-01-03 NOTE — Progress Notes (Signed)
Orders placed for bone scan per Dr. Delton Coombes

## 2020-01-03 NOTE — Assessment & Plan Note (Signed)
1.  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 -She was evaluated by Dr. Marla Roe and was also referred to Dr. Marlou Starks.  She was recommended tissue expander followed by implant.  Patient tells me that she decided not to go with breast reconstruction. -I will make a referral back to Dr. Arnoldo Morale for bilateral mastectomies.  I have reached out and informed him. -She reported left upper extremity and left posterior chest wall pain for the last few weeks. -She initially had right upper extremity pain when she started chemotherapy which completely resolved.  I have recommended a bone scan to work this up. -We have reviewed her CBC.  White count is adequate.  Hemoglobin is 8.7 with MCV of 105.  This will likely recover as she is off chemotherapy at this time. -LFTs and kidney function is within normal limits reviewed by me.  She will proceed with her maintenance Herceptin and Pertuzumab today.  2.  BRCA1 positivity: -She is undergoing bilateral mastectomies.  She already saw [name] for bilateral oophorectomy.  3.  Hypokalemia: -She is taking potassium 20 mEq twice daily.  Potassium today is 3.3.  She has not taken her morning dose today.  4.  Severe hypomagnesemia: -Magnesium today is 1.3.  She is taking magnesium 3 times daily. -She will receive 2 g of IV magnesium today.  This will likely improve as she is off of chemotherapy now.  5.  High risk drug monitoring: -2D echocardiogram reviewed by me from 12/30/2019 shows preserved ejection fraction 60-65%.

## 2020-01-07 ENCOUNTER — Inpatient Hospital Stay (HOSPITAL_COMMUNITY): Payer: Medicaid Other | Admitting: Hematology

## 2020-01-07 ENCOUNTER — Other Ambulatory Visit: Payer: Self-pay

## 2020-01-08 ENCOUNTER — Encounter: Payer: Self-pay | Admitting: Obstetrics and Gynecology

## 2020-01-08 ENCOUNTER — Other Ambulatory Visit: Payer: Self-pay

## 2020-01-08 ENCOUNTER — Telehealth: Payer: Self-pay | Admitting: Plastic Surgery

## 2020-01-08 ENCOUNTER — Ambulatory Visit: Payer: Medicaid Other | Admitting: Obstetrics and Gynecology

## 2020-01-08 ENCOUNTER — Other Ambulatory Visit (HOSPITAL_COMMUNITY)
Admission: RE | Admit: 2020-01-08 | Discharge: 2020-01-08 | Disposition: A | Discharge status: Home / Self Care | Payer: Medicaid Other | Source: Ambulatory Visit | Attending: Obstetrics and Gynecology | Admitting: Obstetrics and Gynecology

## 2020-01-08 DIAGNOSIS — C50411 Malignant neoplasm of upper-outer quadrant of right female breast: Secondary | ICD-10-CM | POA: Diagnosis not present

## 2020-01-08 DIAGNOSIS — Z170 Estrogen receptor positive status [ER+]: Secondary | ICD-10-CM | POA: Diagnosis not present

## 2020-01-08 DIAGNOSIS — Z1272 Encounter for screening for malignant neoplasm of vagina: Secondary | ICD-10-CM | POA: Diagnosis not present

## 2020-01-08 DIAGNOSIS — Z01419 Encounter for gynecological examination (general) (routine) without abnormal findings: Secondary | ICD-10-CM

## 2020-01-08 DIAGNOSIS — Z124 Encounter for screening for malignant neoplasm of cervix: Secondary | ICD-10-CM | POA: Insufficient documentation

## 2020-01-08 NOTE — Progress Notes (Signed)
Patient ID: Monica Hancock, female   DOB: Oct 12, 1981, 39 y.o.   MRN: [id number]    [facility] Visit  @DATE @            Patient name: Monica Hancock MRN [id number]  Date of birth: 03/28/1981  CC & HPI:  [name] is a 39 y.o. female presenting today for gyn follow up.  She no longer wants reconstruction of her breasts and only wasn't doubles mastectomy. She would prefer Dr. Arnoldo Morale to do her surgeries. She plans to have bilateral salpingo-oophorectomy.   ROS:  ROS +rectocele  Pertinent History Reviewed:   Reviewed: Significant for breast cancer Medical         Past Medical History:  Diagnosis Date  . BRCA1 positive   . Family history of BRCA1 gene positive   . Family history of breast cancer   . H/O Bell's palsy 2000   Left sided  . HSV infection                               Surgical Hx:    Past Surgical History:  Procedure Laterality Date  . IR IMAGING GUIDED PORT INSERTION  08/26/2019  . TUBAL LIGATION     Medications: Reviewed & Updated - see associated section                       Current Outpatient Medications:  .  CARBOPLATIN IV, Inject into the vein every 21 ( twenty-one) days., Disp: , Rfl:  .  cyclobenzaprine (FLEXERIL) 10 MG tablet, 1/2 po qhs, Disp: 30 tablet, Rfl: 0 .  diphenhydrAMINE (BENADRYL) 50 MG tablet, Take 50 mg by mouth daily., Disp: , Rfl:  .  DOCEtaxel (TAXOTERE IV), Inject into the vein every 21 ( twenty-one) days., Disp: , Rfl:  .  loratadine (CLARITIN) 10 MG tablet, Take 10 mg by mouth daily., Disp: , Rfl:  .  magnesium oxide (MAG-OX) 400 (241.3 Mg) MG tablet, Take 1 tablet (400 mg total) by mouth 3 (three) times daily., Disp: 90 tablet, Rfl: 1 .  pertuzumab in sodium chloride 0.9 % 250 mL, Inject into the vein every 21 ( twenty-one) days., Disp: , Rfl:  .  potassium chloride SA (KLOR-CON) 10 MEQ tablet, Take 2 tablets (20 mEq total) by mouth 2 (two) times daily., Disp: 120 tablet, Rfl: 3 .  prochlorperazine (COMPAZINE) 10  MG tablet, Take 1 tablet (10 mg total) by mouth every 6 (six) hours as needed for nausea or vomiting., Disp: 30 tablet, Rfl: 3 .  trastuzumab-dkst 2 mg/kg in sodium chloride 0.9 % 250 mL, Inject into the vein every 21 ( twenty-one) days., Disp: , Rfl:    Social History: Reviewed -  reports that she has never smoked. She has never used smokeless tobacco.  Objective Findings:  Vitals: Blood pressure 116/79, pulse (!) 101, height 4' 8"  (1.422 m), weight 144 lb 12.8 oz (65.7 kg).  PHYSICAL EXAMINATION General appearance - alert, well appearing, and in no distress Mental status - normal mood, behavior, speech, dress, motor activity, and thought processes, affect appropriate to mood Chest - clear to auscultation, no wheezes, rales or rhonchi, symmetric air entry port-a-cath on right side.  Heart - normal rate, regular rhythm, normal S1, S2, no murmurs, rubs, clicks or gallops  PELVIC Vagina - normal appearing Cervix - normal  Uterus - small normal  Rectal- small rectocele, stool guaiac negative.  PAP: Pap smear done today.  Assessment & Plan:   A:  1. HER2+ Right breast cancer 2. Discussion of salpingo-oophorectomy vs supracervical hysterectomy with removal of tubes and ovaries. 3. PAP  4. Minimal rectocele  P:   I personally performed the services described in this documentation, which was SCRIBED in my presence. The recorded information has been reviewed and considered accurate. It has been edited as necessary during review. [name], MD 1.  2.    By signing my name below, I, Samul Dada, attest that this documentation has been prepared under the direction and in the presence of [name], MD. Electronically Signed: [place]. 01/08/20. 10:39 AM.  I personally performed the services described in this documentation, which was SCRIBED in my presence. The recorded information has been reviewed and considered accurate. It has been edited as necessary  during review. [name], MD

## 2020-01-08 NOTE — Telephone Encounter (Signed)
[name], Pt's Obgyn, wanted to speak to Dr. Marla Roe in regards to when we may be scheduling the mastectomy and reconstruction so that he can plan for the hysterectomy. He didn't want to interfere with her getting the other surgeries done. Please call him on his cell [phone] to advise.

## 2020-01-13 ENCOUNTER — Other Ambulatory Visit: Payer: Self-pay | Admitting: Obstetrics and Gynecology

## 2020-01-13 ENCOUNTER — Telehealth: Payer: Self-pay | Admitting: Plastic Surgery

## 2020-01-13 DIAGNOSIS — Z1501 Genetic susceptibility to malignant neoplasm of breast: Secondary | ICD-10-CM

## 2020-01-13 DIAGNOSIS — Z1502 Genetic susceptibility to malignant neoplasm of ovary: Secondary | ICD-10-CM

## 2020-01-13 NOTE — Telephone Encounter (Signed)
Frances Furbish called and spoke with the patient.//AB/CMA

## 2020-01-13 NOTE — Telephone Encounter (Signed)
I contacted [facility] to inquire about the possible surgery coordination with Dr. Lonia Blood for breast reconstruction. Debbie at [facility] informed me that Dr. Ethlyn Gallery note indicated the patient is going to proceed with the mastectomy, but will be with her original general surgeon, Dr. Aviva Signs.   I contacted the patient to confirm the status update and ensure that I was aware of the patient's request. The patient has advised that she does not want any reconstruction at this time. She will be meeting with Dr. Arnoldo Morale this week to discuss the plan for surgery with him. I advised the patient that I would pass this information along to Dr. Marla Roe so that she is aware of the patient's decision. I also made the patient aware that if she needs [place] or decides to have reconstruction at a later time, to please contact the office. Monica Hancock verbalized understanding.

## 2020-01-14 ENCOUNTER — Ambulatory Visit: Payer: Medicaid Other | Admitting: Plastic Surgery

## 2020-01-14 ENCOUNTER — Other Ambulatory Visit: Payer: Self-pay

## 2020-01-14 ENCOUNTER — Ambulatory Visit (INDEPENDENT_AMBULATORY_CARE_PROVIDER_SITE_OTHER): Payer: Medicaid Other

## 2020-01-14 DIAGNOSIS — Z1501 Genetic susceptibility to malignant neoplasm of breast: Secondary | ICD-10-CM | POA: Diagnosis not present

## 2020-01-14 DIAGNOSIS — Z1502 Genetic susceptibility to malignant neoplasm of ovary: Secondary | ICD-10-CM | POA: Diagnosis not present

## 2020-01-14 DIAGNOSIS — Z1509 Genetic susceptibility to other malignant neoplasm: Secondary | ICD-10-CM

## 2020-01-14 LAB — CYTOLOGY - PAP
Chlamydia: NEGATIVE
Comment: NEGATIVE
Comment: NEGATIVE
Comment: NORMAL
Diagnosis: UNDETERMINED — AB
High risk HPV: NEGATIVE
Neisseria Gonorrhea: NEGATIVE

## 2020-01-14 NOTE — Progress Notes (Signed)
PELVIC US TA/TV: homogeneous anteverted uterus,wnl,mult simple small nabothian cysts,EEC 3.2 mm,normal ovaries,ovaries appear mobile,no free fluid,no pain during ultrasound

## 2020-01-16 ENCOUNTER — Encounter: Payer: Self-pay | Admitting: General Surgery

## 2020-01-16 ENCOUNTER — Ambulatory Visit (INDEPENDENT_AMBULATORY_CARE_PROVIDER_SITE_OTHER): Payer: Medicaid Other | Admitting: General Surgery

## 2020-01-16 ENCOUNTER — Other Ambulatory Visit: Payer: Self-pay

## 2020-01-16 VITALS — BP 109/76 | HR 69 | Temp 97.7°F | Resp 14 | Ht <= 58 in | Wt 147.0 lb

## 2020-01-16 DIAGNOSIS — C50411 Malignant neoplasm of upper-outer quadrant of right female breast: Secondary | ICD-10-CM | POA: Diagnosis not present

## 2020-01-16 NOTE — Progress Notes (Signed)
Subjective:     Monica Hancock  Patient returns to schedule bilateral mastectomy.  She was seen by plastic surgery and general surgery in [facility] and is elected just to proceed with bilateral mastectomy.  She is also being seen by Dr. Glo Hancock of GYN for evaluation of possible bilateral salpingo-oophorectomy.  She is also still undergoing active chemotherapy. Objective:    BP 109/76   Pulse 69   Temp 97.7 F (36.5 C) (Oral)   Resp 14   Ht 4' 8"  (1.422 m)   Wt 147 lb (66.7 kg)   SpO2 98%   BMI 32.96 kg/m   General:  alert, cooperative and no distress       Assessment:    Right breast cancer, BRCA1, desire for prophylactic mastectomy    Plan:   We will coordinate surgery with oncology as she is undergoing chemotherapy next week.  We will also contact Dr. Glo Hancock concerning GYN surgery.  We will proceed with right modified radical mastectomy and left simple mastectomy.  The risks and benefits of the procedures including bleeding, infection, arm swelling, the possibility of needing a blood transfusion were fully explained to the patient, who gave informed consent.

## 2020-01-16 NOTE — Patient Instructions (Signed)
Total or Modified Radical Mastectomy A total mastectomy and a modified radical mastectomy are surgeries that are done as part of treatment for breast cancer. You will have one of those types of surgery. Both types involve removing a breast.  In a total mastectomy (simple mastectomy), all breast tissue including the nipple will be removed.  In a modified radical mastectomy, lymph nodes under the arm will be removed along with the breast and nipple. Some of the lining over the muscle tissues under the breast may also be removed. These procedures may also be used to help prevent breast cancer. A preventive (prophylactic) mastectomy may be done if you are at an increased risk of breast cancer due to harmful changes (mutations) in certain genes (BRCA genes). In that case, the procedure involves removing both of your breasts. This can reduce your risk of developing breast cancer in the future. For a transgender person, a total mastectomy may be done as part of a surgical transition from female to female. Let your health care provider know about:  Any allergies you have.  All medicines you are taking, including vitamins, herbs, eye drops, creams, and over-the-counter medicines.  Any problems you or family members have had with anesthetic medicines.  Any blood disorders you have.  Any surgeries you have had.  Any medical conditions you have.  Whether you are pregnant or may be pregnant. What are the risks? Generally, this is a safe procedure. However, problems may occur, including:  Pain.  Infection.  Bleeding.  Allergic reactions to medicines.  Scar tissue.  Chest numbness on the side of the surgery.  Fluid buildup under the skin flaps where your breast was removed (seroma).  Sensation of throbbing or tingling.  Stress or sadness from losing your breast. If you have the lymph nodes under your arm removed, you may have arm swelling, weakness, or numbness on the same side of your body  as your surgery. What happens before the procedure? Medicines  Ask your health care provider about: ? Changing or stopping your regular medicines. This is especially important if you are taking diabetes medicines or blood thinners. ? Taking medicines such as aspirin and ibuprofen. These medicines can thin your blood. Do not take these medicines unless your health care provider tells you to take them. ? Taking over-the-counter medicines, vitamins, herbs, and supplements.  Your health care team may give you antibiotic medicine to help prevent infection. General instructions  You may be checked for extra fluid around your lymph nodes (lymphedema).  Plan to have someone take you home from the hospital or clinic.  Plan to have a responsible adult care for you for at least 24 hours after you leave the hospital or clinic. This is important.  Ask your health care provider how your surgical site will be marked or identified.  You may be asked to shower with a germ-killing soap. What happens during the procedure?   To lower your risk of infection: ? Your health care team will wash or sanitize their hands. ? Your skin will be washed with soap.  An IV will be inserted into one of your veins.  You will be given a medicine to make you fall asleep (general anesthetic).  A wide incision will be made around your nipple. The skin and nipple inside the incision will be removed along with all breast tissue.  If you are having a modified radical mastectomy: ? The lining over your chest muscles will be removed. ? The incision may  be extended to reach the lymph nodes under your arm, or a second incision may be made. ? Lymph nodes will be removed.  Breast tissue and lymph nodes that are removed will be sent to the lab for testing.  You may have a drainage tube inserted into your incision to collect fluid that builds up after surgery. This tube will be connected to a suction bulb on the outside of  your body to remove the fluid.  Your incision or incisions will be closed with stitches (sutures).  A bandage (dressing) will be placed over your breast area. If lymph nodes were removed, a dressing will also be placed under your arm. The procedure may vary among health care providers and hospitals. What happens after the procedure?  Your blood pressure, heart rate, breathing rate, and blood oxygen level will be monitored until the medicines you were given have worn off.  You will be given pain medicine as needed.  You will be encouraged to get up and walk as soon as you can.  Your IV can be removed when you are able to eat and drink.  You may have a drainage tube in place for 2-3 days to prevent a collection of blood (hematoma) from developing in the breast area. You will be given instructions about caring for the drain before you go home.  A pressure bandage may be applied for 1-2 days to prevent bleeding or swelling. Ask your health care provider how to care for your pressure bandage at home. Summary  In a total mastectomy (simple mastectomy), all breast tissue including the nipple will be removed. In a modified radical mastectomy, the lymph nodes under the arm will be removed along with the breast and nipple.  Before the procedure, follow instructions from your health care provider about eating and drinking, and ask about changing or stopping your regular medicines.  You will be given a medicine to make you fall asleep (general anesthetic) during the procedure. This information is not intended to replace advice given to you by your health care provider. Make sure you discuss any questions you have with your health care provider. Document Revised: 01/11/2019 Document Reviewed: 08/11/2017 Elsevier Patient Education  2020 [employer] Surgical drains are used to remove extra fluid that normally builds up in a surgical wound after surgery. A surgical drain  helps to heal a surgical wound. Different kinds of surgical drains include:  Active drains. These drains use suction to pull drainage away from the surgical wound. Drainage flows through a tube to a container outside of the body. With these drains, you need to keep the bulb or the drainage container flat (compressed) at all times, except while you empty it. Flattening the bulb or container creates suction.  Passive drains. These drains allow fluid to drain naturally, by gravity. Drainage flows through a tube to a bandage (dressing) or a container outside of the body. Passive drains do not need to be emptied. A drain is placed during surgery. Right after surgery, drainage is usually bright red and a little thicker than water. The drainage may gradually turn yellow or pink and become thinner. It is likely that your health care provider will remove the drain when the drainage stops or when the amount decreases to 1-2 Tbsp (15-30 mL) during a 24-hour period. Supplies needed:  Tape.  Germ-free cleaning solution (sterile saline).  Cotton swabs.  Split gauze drain sponge: 4 x 4 inches (10 x 10 cm).  Gauze  square: 4 x 4 inches (10 x 10 cm). How to care for your surgical drain Care for your drain as told by your health care provider. This is important to help prevent infection. If your drain is placed at your back, or any other hard-to-reach area, ask another person to assist you in performing the following tasks: General care  Keep the skin around the drain dry and covered with a dressing at all times.  Check your drain area every day for signs of infection. Check for: ? Redness, swelling, or pain. ? Pus or a bad smell. ? Cloudy drainage. ? Tenderness or pressure at the drain exit site. Changing the dressing Follow instructions from your health care provider about how to change your dressing. Change your dressing at least once a day. Change it more often if needed to keep the dressing dry. Make  sure you: 1. Gather your supplies. 2. Wash your hands with soap and water before you change your dressing. If soap and water are not available, use hand sanitizer. 3. Remove the old dressing. Avoid using scissors to do that. 4. Wash your hands with soap and water again after removing the old dressing. 5. Use sterile saline to clean your skin around the drain. You may need to use a cotton swab to clean the skin. 6. Place the tube through the slit in a drain sponge. Place the drain sponge so that it covers your wound. 7. Place the gauze square or another drain sponge on top of the drain sponge that is on the wound. Make sure the tube is between those layers. 8. Tape the dressing to your skin. 9. Tape the drainage tube to your skin 1-2 inches (2.5-5 cm) below the place where the tube enters your body. Taping keeps the tube from pulling on any stitches (sutures) that you have. 10. Wash your hands with soap and water. 11. Write down the color of your drainage and how often you change your dressing. How to empty your active drain  1. Make sure that you have a measuring cup that you can empty your drainage into. 2. Wash your hands with soap and water. If soap and water are not available, use hand sanitizer. 3. Loosen any pins or clips that hold the tube in place. 4. If your health care provider tells you to strip the tube to prevent clots and tube blockages: ? Hold the tube at the skin with one hand. Use your other hand to pinch the tubing with your thumb and first finger. ? Gently move your fingers down the tube while squeezing very lightly. This clears any drainage, clots, or tissue from the tube. ? You may need to do this several times each day to keep the tube clear. Do not pull on the tube. 5. Open the bulb cap or the drain plug. Do not touch the inside of the cap or the bottom of the plug. 6. Turn the device upside down and gently squeeze. 7. Empty all of the drainage into the measuring  cup. 8. Compress the bulb or the container and replace the cap or the plug. To compress the bulb or the container, squeeze it firmly in the middle while you close the cap or plug the container. 9. Write down the amount of drainage that you have in each 24-hour period. If you have less than 2 Tbsp (30 mL) of drainage during 24 hours, contact your health care provider. 10. Flush the drainage down the toilet. 11. Wash your  hands with soap and water. Contact a health care provider if:  You have redness, swelling, or pain around your drain area.  You have pus or a bad smell coming from your drain area.  You have a fever or chills.  The skin around your drain is warm to the touch.  The amount of drainage that you have is increasing instead of decreasing.  You have drainage that is cloudy.  There is a sudden stop or a sudden decrease in the amount of drainage that you have.  Your drain tube falls out.  Your active drain does not stay compressed after you empty it. Summary  Surgical drains are used to remove extra fluid that normally builds up in a surgical wound after surgery.  Different kinds of surgical drains include active drains and passive drains. Active drains use suction to pull drainage away from the surgical wound, and passive drains allow fluid to drain naturally.  It is important to care for your drain to prevent infection. If your drain is placed at your back, or any other hard-to-reach area, ask another person to assist you.  Contact your health care provider if you have redness, swelling, or pain around your drain area. This information is not intended to replace advice given to you by your health care provider. Make sure you discuss any questions you have with your health care provider. Document Revised: 12/12/2018 Document Reviewed: 12/12/2018 Elsevier Patient Education  2020 [employer].

## 2020-01-21 NOTE — Patient Instructions (Signed)
Monica Hancock  01/21/2020     @PREFPERIOPPHARMACY @   Your procedure is scheduled on  01/24/2020 .  Report to [facility] at  [phone]  A.M.  Call this number if you have problems the morning of surgery:  [phone]   Remember:  Do not eat or drink after midnight.                        Take these medicines the morning of surgery with A SIP OF WATER  Flexeril(if needed), compazine (if needed).    Do not wear jewelry, make-up or nail polish.  Do not wear lotions, powders, or perfumes or deodorant. Please bush your teeth.  Do not shave 48 hours prior to surgery.  Men may shave face and neck.  Do not bring valuables to the hospital.  [facility] is not responsible for any belongings or valuables.  Contacts, dentures or bridgework may not be worn into surgery.  Leave your suitcase in the car.  After surgery it may be brought to your room.  For patients admitted to the hospital, discharge time will be determined by your treatment team.  Patients discharged the day of surgery will not be allowed to drive home.   Name and phone number of your driver:   family Special instructions:  DO NOT smoke the morning of your surgery.  Please read over the following fact sheets that you were given. Pain Booklet, Coughing and Deep Breathing, Blood Transfusion Information, Lab Information, MRSA Information, Surgical Site Infection Prevention, Anesthesia Post-op Instructions and Care and Recovery After Surgery       Total or Modified Radical Mastectomy, Care After This sheet gives you information about how to care for yourself after your procedure. Your health care provider may also give you more specific instructions. If you have problems or questions, contact your health care provider. What can I expect after the procedure? After the procedure, it is common to have:  Pain.  Numbness.  Stiffness in the arm or shoulder.  Feelings of stress, sadness, or depression. If the  lymph nodes under your arm were removed, you may have arm swelling, weakness, or numbness on the same side of your body as your surgery. Follow these instructions at home: Incision care   Follow instructions from your health care provider about how to take care of your incision. Make sure you: ? Wash your hands with soap and water before you change your bandage (dressing). If soap and water are not available, use hand sanitizer. ? Change your dressing as told by your health care provider. ? Leave stitches (sutures), skin glue, or adhesive strips in place. These skin closures may need to stay in place for 2 weeks or longer. If adhesive strip edges start to loosen and curl up, you may trim the loose edges. Do not remove adhesive strips completely unless your health care provider tells you to do that.  Check your incision area every day for signs of infection. Check for: ? Redness, swelling, or more pain. ? Fluid or blood. ? Warmth. ? Pus or a bad smell.  If you were sent home with a surgical drain in place, follow instructions from your health care provider about emptying it. Bathing  Do not take baths, swim, or use a hot tub until your health care provider approves. Ask your health care provider if you may take showers. You may only be allowed to take  sponge baths. Activity  Return to your normal activities as told by your health care provider. Ask your health care provider what activities are safe for you.  Avoid activities that take a lot of effort.  Be careful to avoid any activities that could cause an injury to your arm on the side of your surgery.  Do not lift anything that is heavier than 10 lb (4.5 kg), or the limit that you are told, until your health care provider says that it is safe.  Avoid lifting with the arm on the side of your surgery.  Do not carry heavy objects on your shoulder.  After your drain is removed, do exercises to prevent stiffness and swelling in your  arm. Talk with your health care provider about which exercises are safe for you. General instructions  Take over-the-counter and prescription medicines only as told by your health care provider.  You may eat what you usually do.  Keep your arm raised (elevated) above the level of your heart when you are sitting or lying down.  Do not wear tight jewelry on your arm, wrist, or fingers on the side of your surgery.  You may be given a tight sleeve (compression bandage) to wear over your arm on the side of your surgery. Wear this sleeve as told by your health care provider.  Ask your health care provider when you can start wearing a bra or using a breast prosthesis.  Before you are involved in certain procedures such as giving blood or having your blood pressure checked, tell all your health care providers if lymph nodes under your arm were removed. This is important information. Follow-up  Keep all follow-up visits as told by your health care provider. This is important.  Get checked for extra fluid around your lymph nodes (lymphedema) as often as told by your health care provider. Contact a health care provider if:  You have a fever.  Your pain medicine is not working.  Your arm swelling, weakness, or numbness has not improved after a few weeks.  You have new swelling in your breast area or arm.  You have redness, swelling, or more pain in your incision area.  You have fluid or blood coming from your incision.  Your incision feels warm to the touch.  You have pus or a bad smell coming from your incision. Get help right away if:  You have very bad pain in your breast area or arm.  You have chest pain.  You have difficulty breathing. Summary  Follow instructions from your health care provider about how to take care of your incision. Check your incision area every day for signs of infection.  Ask your health care provider what activities are safe for you.  Keep all  follow-up visits as told by your health care provider. This is important.  Make sure you know which symptoms should cause you to contact your health care provider or to get help right away. This information is not intended to replace advice given to you by your health care provider. Make sure you discuss any questions you have with your health care provider. Document Revised: 01/11/2019 Document Reviewed: 08/11/2017 Elsevier Patient Education  2020 [place] Anesthesia, Adult, Care After This sheet gives you information about how to care for yourself after your procedure. Your health care provider may also give you more specific instructions. If you have problems or questions, contact your health care provider. What can I expect after the procedure?  After the procedure, the following side effects are common:  Pain or discomfort at the IV site.  Nausea.  Vomiting.  Sore throat.  Trouble concentrating.  Feeling cold or chills.  Weak or tired.  Sleepiness and fatigue.  Soreness and body aches. These side effects can affect parts of the body that were not involved in surgery. Follow these instructions at home:  For at least 24 hours after the procedure:  Have a responsible adult stay with you. It is important to have someone help care for you until you are awake and alert.  Rest as needed.  Do not: ? Participate in activities in which you could fall or become injured. ? Drive. ? Use heavy machinery. ? Drink alcohol. ? Take sleeping pills or medicines that cause drowsiness. ? Make important decisions or sign legal documents. ? Take care of children on your own. Eating and drinking  Follow any instructions from your health care provider about eating or drinking restrictions.  When you feel hungry, start by eating small amounts of foods that are soft and easy to digest (bland), such as toast. Gradually return to your regular diet.  Drink enough fluid to keep  your urine pale yellow.  If you vomit, rehydrate by drinking water, juice, or clear broth. General instructions  If you have sleep apnea, surgery and certain medicines can increase your risk for breathing problems. Follow instructions from your health care provider about wearing your sleep device: ? Anytime you are sleeping, including during daytime naps. ? While taking prescription pain medicines, sleeping medicines, or medicines that make you drowsy.  Return to your normal activities as told by your health care provider. Ask your health care provider what activities are safe for you.  Take over-the-counter and prescription medicines only as told by your health care provider.  If you smoke, do not smoke without supervision.  Keep all follow-up visits as told by your health care provider. This is important. Contact a health care provider if:  You have nausea or vomiting that does not get better with medicine.  You cannot eat or drink without vomiting.  You have pain that does not get better with medicine.  You are unable to pass urine.  You develop a skin rash.  You have a fever.  You have redness around your IV site that gets worse. Get help right away if:  You have difficulty breathing.  You have chest pain.  You have blood in your urine or stool, or you vomit blood. Summary  After the procedure, it is common to have a sore throat or nausea. It is also common to feel tired.  Have a responsible adult stay with you for the first 24 hours after general anesthesia. It is important to have someone help care for you until you are awake and alert.  When you feel hungry, start by eating small amounts of foods that are soft and easy to digest (bland), such as toast. Gradually return to your regular diet.  Drink enough fluid to keep your urine pale yellow.  Return to your normal activities as told by your health care provider. Ask your health care provider what activities are  safe for you. This information is not intended to replace advice given to you by your health care provider. Make sure you discuss any questions you have with your health care provider. Document Revised: 11/10/2017 Document Reviewed: 06/23/2017 Elsevier Patient Education  [place]. How to Use Chlorhexidine for Bathing Chlorhexidine gluconate (CHG)  is a germ-killing (antiseptic) solution that is used to clean the skin. It can get rid of the bacteria that normally live on the skin and can keep them away for about 24 hours. To clean your skin with CHG, you may be given:  A CHG solution to use in the shower or as part of a sponge bath.  A prepackaged cloth that contains CHG. Cleaning your skin with CHG may help lower the risk for infection:  While you are staying in the intensive care unit of the hospital.  If you have a vascular access, such as a central line, to provide short-term or long-term access to your veins.  If you have a catheter to drain urine from your bladder.  If you are on a ventilator. A ventilator is a machine that helps you breathe by moving air in and out of your lungs.  After surgery. What are the risks? Risks of using CHG include:  A skin reaction.  Hearing loss, if CHG gets in your ears.  Eye injury, if CHG gets in your eyes and is not rinsed out.  The CHG product catching fire. Make sure that you avoid smoking and flames after applying CHG to your skin. Do not use CHG:  If you have a chlorhexidine allergy or have previously reacted to chlorhexidine.  On babies younger than 58 months of age. How to use CHG solution  Use CHG only as told by your health care provider, and follow the instructions on the label.  Use the full amount of CHG as directed. Usually, this is one bottle. During a shower Follow these steps when using CHG solution during a shower (unless your health care provider gives you different instructions): 1. Start the shower. 2. Use  your normal soap and shampoo to wash your face and hair. 3. Turn off the shower or move out of the shower stream. 4. Pour the CHG onto a clean washcloth. Do not use any type of brush or rough-edged sponge. 5. Starting at your neck, lather your body down to your toes. Make sure you follow these instructions: ? If you will be having surgery, pay special attention to the part of your body where you will be having surgery. Scrub this area for at least 1 minute. ? Do not use CHG on your head or face. If the solution gets into your ears or eyes, rinse them well with water. ? Avoid your genital area. ? Avoid any areas of skin that have broken skin, cuts, or scrapes. ? Scrub your back and under your arms. Make sure to wash skin folds. 6. Let the lather sit on your skin for 1-2 minutes or as long as told by your health care provider. 7. Thoroughly rinse your entire body in the shower. Make sure that all body creases and crevices are rinsed well. 8. Dry off with a clean towel. Do not put any substances on your body afterward--such as powder, lotion, or perfume--unless you are told to do so by your health care provider. Only use lotions that are recommended by the manufacturer. 9. Put on clean clothes or pajamas. 10. If it is the night before your surgery, sleep in clean sheets.  During a sponge bath Follow these steps when using CHG solution during a sponge bath (unless your health care provider gives you different instructions): 1. Use your normal soap and shampoo to wash your face and hair. 2. Pour the CHG onto a clean washcloth. 3. Starting at your neck, lather  your body down to your toes. Make sure you follow these instructions: ? If you will be having surgery, pay special attention to the part of your body where you will be having surgery. Scrub this area for at least 1 minute. ? Do not use CHG on your head or face. If the solution gets into your ears or eyes, rinse them well with water. ? Avoid  your genital area. ? Avoid any areas of skin that have broken skin, cuts, or scrapes. ? Scrub your back and under your arms. Make sure to wash skin folds. 4. Let the lather sit on your skin for 1-2 minutes or as long as told by your health care provider. 5. Using a different clean, wet washcloth, thoroughly rinse your entire body. Make sure that all body creases and crevices are rinsed well. 6. Dry off with a clean towel. Do not put any substances on your body afterward--such as powder, lotion, or perfume--unless you are told to do so by your health care provider. Only use lotions that are recommended by the manufacturer. 7. Put on clean clothes or pajamas. 8. If it is the night before your surgery, sleep in clean sheets. How to use CHG prepackaged cloths  Only use CHG cloths as told by your health care provider, and follow the instructions on the label.  Use the CHG cloth on clean, dry skin.  Do not use the CHG cloth on your head or face unless your health care provider tells you to.  When washing with the CHG cloth: ? Avoid your genital area. ? Avoid any areas of skin that have broken skin, cuts, or scrapes. Before surgery Follow these steps when using a CHG cloth to clean before surgery (unless your health care provider gives you different instructions): 1. Using the CHG cloth, vigorously scrub the part of your body where you will be having surgery. Scrub using a back-and-forth motion for 3 minutes. The area on your body should be completely wet with CHG when you are done scrubbing. 2. Do not rinse. Discard the cloth and let the area air-dry. Do not put any substances on the area afterward, such as powder, lotion, or perfume. 3. Put on clean clothes or pajamas. 4. If it is the night before your surgery, sleep in clean sheets.  For general bathing Follow these steps when using CHG cloths for general bathing (unless your health care provider gives you different instructions). 1. Use a  separate CHG cloth for each area of your body. Make sure you wash between any folds of skin and between your fingers and toes. Wash your body in the following order, switching to a new cloth after each step: ? The front of your neck, shoulders, and chest. ? Both of your arms, under your arms, and your hands. ? Your stomach and groin area, avoiding the genitals. ? Your right leg and foot. ? Your left leg and foot. ? The back of your neck, your back, and your buttocks. 2. Do not rinse. Discard the cloth and let the area air-dry. Do not put any substances on your body afterward--such as powder, lotion, or perfume--unless you are told to do so by your health care provider. Only use lotions that are recommended by the manufacturer. 3. Put on clean clothes or pajamas. Contact a health care provider if:  Your skin gets irritated after scrubbing.  You have questions about using your solution or cloth. Get help right away if:  Your eyes become very red  or swollen.  Your eyes itch badly.  Your skin itches badly and is red or swollen.  Your hearing changes.  You have trouble seeing.  You have swelling or tingling in your mouth or throat.  You have trouble breathing.  You swallow any chlorhexidine. Summary  Chlorhexidine gluconate (CHG) is a germ-killing (antiseptic) solution that is used to clean the skin. Cleaning your skin with CHG may help to lower your risk for infection.  You may be given CHG to use for bathing. It may be in a bottle or in a prepackaged cloth to use on your skin. Carefully follow your health care provider's instructions and the instructions on the product label.  Do not use CHG if you have a chlorhexidine allergy.  Contact your health care provider if your skin gets irritated after scrubbing. This information is not intended to replace advice given to you by your health care provider. Make sure you discuss any questions you have with your health care  provider. Document Revised: 01/24/2019 Document Reviewed: 10/05/2017 Elsevier Patient Education  [place].

## 2020-01-22 ENCOUNTER — Encounter (HOSPITAL_COMMUNITY): Payer: Self-pay

## 2020-01-22 ENCOUNTER — Other Ambulatory Visit: Payer: Self-pay

## 2020-01-22 ENCOUNTER — Encounter (HOSPITAL_COMMUNITY)
Admission: RE | Admit: 2020-01-22 | Discharge: 2020-01-22 | Disposition: A | Discharge status: Home / Self Care | Payer: Medicaid Other | Source: Ambulatory Visit | Attending: Hematology | Admitting: Hematology

## 2020-01-22 ENCOUNTER — Encounter (HOSPITAL_COMMUNITY)
Admission: RE | Admit: 2020-01-22 | Discharge: 2020-01-22 | Disposition: A | Discharge status: Home / Self Care | Payer: Medicaid Other | Source: Ambulatory Visit | Attending: General Surgery | Admitting: General Surgery

## 2020-01-22 ENCOUNTER — Other Ambulatory Visit (HOSPITAL_COMMUNITY)
Admission: RE | Admit: 2020-01-22 | Discharge: 2020-01-22 | Disposition: A | Discharge status: Home / Self Care | Payer: Medicaid Other | Source: Ambulatory Visit | Attending: General Surgery | Admitting: General Surgery

## 2020-01-22 DIAGNOSIS — Z20822 Contact with and (suspected) exposure to covid-19: Secondary | ICD-10-CM | POA: Insufficient documentation

## 2020-01-22 DIAGNOSIS — Z01812 Encounter for preprocedural laboratory examination: Secondary | ICD-10-CM | POA: Diagnosis present

## 2020-01-22 DIAGNOSIS — C50411 Malignant neoplasm of upper-outer quadrant of right female breast: Secondary | ICD-10-CM

## 2020-01-22 HISTORY — DX: Malignant (primary) neoplasm, unspecified: C80.1

## 2020-01-22 HISTORY — DX: Family history of other specified conditions: Z84.89

## 2020-01-22 LAB — CBC WITH DIFFERENTIAL/PLATELET
Abs Immature Granulocytes: 0.02 10*3/uL (ref 0.00–0.07)
Basophils Absolute: 0 10*3/uL (ref 0.0–0.1)
Basophils Relative: 0 %
Eosinophils Absolute: 0.1 10*3/uL (ref 0.0–0.5)
Eosinophils Relative: 2 %
HCT: 34.5 % — ABNORMAL LOW (ref 36.0–46.0)
Hemoglobin: 11.1 g/dL — ABNORMAL LOW (ref 12.0–15.0)
Immature Granulocytes: 0 %
Lymphocytes Relative: 32 %
Lymphs Abs: 2.2 10*3/uL (ref 0.7–4.0)
MCH: 30.7 pg (ref 26.0–34.0)
MCHC: 32.2 g/dL (ref 30.0–36.0)
MCV: 95.6 fL (ref 80.0–100.0)
Monocytes Absolute: 0.5 10*3/uL (ref 0.1–1.0)
Monocytes Relative: 7 %
Neutro Abs: 4.1 10*3/uL (ref 1.7–7.7)
Neutrophils Relative %: 59 %
Platelets: 325 10*3/uL (ref 150–400)
RBC: 3.61 MIL/uL — ABNORMAL LOW (ref 3.87–5.11)
RDW: 13.4 % (ref 11.5–15.5)
WBC: 6.9 10*3/uL (ref 4.0–10.5)
nRBC: 0 % (ref 0.0–0.2)

## 2020-01-22 LAB — HCG, SERUM, QUALITATIVE: Preg, Serum: NEGATIVE

## 2020-01-22 LAB — SARS CORONAVIRUS 2 (TAT 6-24 HRS): SARS Coronavirus 2: NEGATIVE

## 2020-01-22 LAB — PREPARE RBC (CROSSMATCH)

## 2020-01-22 MED ORDER — TECHNETIUM TC 99M MEDRONATE IV KIT
20.0000 | PACK | Freq: Once | INTRAVENOUS | Status: AC | PRN
  Administered 2020-01-22: 20 via INTRAVENOUS

## 2020-01-22 NOTE — H&P (Signed)
Monica Hancock; [id number]; 15-Jun-1981   HPI Patient is a 39 year old Hispanic female who was referred to my care by Dr. Delton Coombes of oncology for evaluation treatment of bilateral mastectomies.  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  She also has BRCA1 gene positivity.  She has a strong family history of breast cancer in her mother who was diagnosed at the age of 19.  She is finishing up her neoadjuvant therapy and would like to undergo bilateral mastectomy.  She is also going to be evaluated by gynecology for TAH-BSO.  She currently has no pain. Past Medical History:  Diagnosis Date  . BRCA1 positive   . Family history of BRCA1 gene positive   . Family history of breast cancer   . H/O Bell's palsy 2000   Left sided  . HSV infection     Past Surgical History:  Procedure Laterality Date  . IR IMAGING GUIDED PORT INSERTION  08/26/2019  . TUBAL LIGATION      Family History  Problem Relation Age of Onset  . Diabetes Mother   . Breast cancer Mother 47  . Cancer Mother   . Diabetes Maternal Grandmother   . Stomach cancer Maternal Grandfather   . Breast cancer Maternal Aunt        dx in her 76s  . Diabetes Maternal Aunt     Current Outpatient Medications on File Prior to Visit  Medication Sig Dispense Refill  . CARBOPLATIN IV Inject into the vein every 21 ( twenty-one) days.    . diphenhydrAMINE (BENADRYL) 50 MG tablet Take 50 mg by mouth daily.    . DOCEtaxel (TAXOTERE IV) Inject into the vein every 21 ( twenty-one) days.    Marland Kitchen loratadine (CLARITIN) 10 MG tablet Take 10 mg by mouth daily.    . magnesium oxide (MAG-OX) 400 (241.3 Mg) MG tablet Take 1 tablet (400 mg total) by mouth 3 (three) times daily. 90 tablet 1  . pertuzumab in sodium chloride 0.9 % 250 mL Inject into the vein every 21 ( twenty-one) days.    . potassium chloride SA (KLOR-CON) 10 MEQ tablet Take 2 tablets (20 mEq total) by  mouth 2 (two) times daily. 120 tablet 3  . prochlorperazine (COMPAZINE) 10 MG tablet Take 1 tablet (10 mg total) by mouth every 6 (six) hours as needed for nausea or vomiting. 30 tablet 3  . trastuzumab-dkst 2 mg/kg in sodium chloride 0.9 % 250 mL Inject into the vein every 21 ( twenty-one) days.     No current facility-administered medications on file prior to visit.    No Known Allergies  Social History   Substance and Sexual Activity  Alcohol Use No    Social History   Tobacco Use  Smoking Status Never Smoker  Smokeless Tobacco Never Used    Review of Systems  Constitutional: Positive for malaise/fatigue.  HENT: Negative.   Eyes: Positive for blurred vision.  Respiratory: Negative.   Cardiovascular: Negative.   Gastrointestinal: Positive for nausea.  Genitourinary: Positive for frequency.  Musculoskeletal: Negative.   Skin: Negative.   Neurological: Negative.   Endo/Heme/Allergies: Negative.   Psychiatric/Behavioral: Negative.     Objective   Vitals:   12/12/19 0858  BP: 104/72  Pulse: 91  Resp: 16  Temp: 98.3 F (36.8 C)  SpO2: 98%    Physical Exam Vitals reviewed.  Constitutional:      Appearance: Normal appearance. She is not ill-appearing.  HENT:     Head: Normocephalic and atraumatic.  Cardiovascular:     Rate and Rhythm: Normal rate and regular rhythm.     Heart sounds: Normal heart sounds. No murmur. No friction rub. No gallop.   Pulmonary:     Effort: Pulmonary effort is normal. No respiratory distress.     Breath sounds: Normal breath sounds. No stridor. No wheezing, rhonchi or rales.  Skin:    General: Skin is warm and dry.  Neurological:     Mental Status: She is alert and oriented to person, place, and time.   Breast: No dominant mass, nipple discharge, or dimpling in either breast.  The Port-A-Cath is in place in the right upper chest.  Axillas are negative for palpable nodes.  Oncology notes reviewed  Assessment  Invasive ductal  carcinoma of right breast, ER +, PR-, HER2 + BRCA 1 + Finishing neoadjuvant chemotherapy Plan   I discussed all the surgical options with her concerning bilateral mastectomy including reconstructive surgery.  She is a candidate for bilateral mastectomy.  Patient has been seen by plastic surgery but has elected to proceed with bilateral mastectomy.  Patient is scheduled for right modified radical mastectomy with left simple mastectomy on 01/24/2020.  The risks and benefits of the procedure including bleeding, infection, and the possible need for blood transfusion, and the possibility of arm swelling were fully explained to the patient, who gave informed consent.

## 2020-01-23 ENCOUNTER — Inpatient Hospital Stay (HOSPITAL_COMMUNITY): Payer: Medicaid Other

## 2020-01-23 ENCOUNTER — Inpatient Hospital Stay (HOSPITAL_BASED_OUTPATIENT_CLINIC_OR_DEPARTMENT_OTHER): Payer: Medicaid Other | Admitting: Hematology

## 2020-01-23 ENCOUNTER — Inpatient Hospital Stay (HOSPITAL_COMMUNITY): Payer: Medicaid Other | Attending: Hematology

## 2020-01-23 VITALS — BP 108/62 | HR 73 | Temp 97.5°F | Resp 18

## 2020-01-23 DIAGNOSIS — Z803 Family history of malignant neoplasm of breast: Secondary | ICD-10-CM | POA: Insufficient documentation

## 2020-01-23 DIAGNOSIS — C50411 Malignant neoplasm of upper-outer quadrant of right female breast: Secondary | ICD-10-CM

## 2020-01-23 DIAGNOSIS — R197 Diarrhea, unspecified: Secondary | ICD-10-CM | POA: Insufficient documentation

## 2020-01-23 DIAGNOSIS — Z5112 Encounter for antineoplastic immunotherapy: Secondary | ICD-10-CM | POA: Insufficient documentation

## 2020-01-23 DIAGNOSIS — Z79899 Other long term (current) drug therapy: Secondary | ICD-10-CM | POA: Insufficient documentation

## 2020-01-23 DIAGNOSIS — E876 Hypokalemia: Secondary | ICD-10-CM | POA: Insufficient documentation

## 2020-01-23 DIAGNOSIS — Z9013 Acquired absence of bilateral breasts and nipples: Secondary | ICD-10-CM | POA: Insufficient documentation

## 2020-01-23 DIAGNOSIS — Z170 Estrogen receptor positive status [ER+]: Secondary | ICD-10-CM | POA: Insufficient documentation

## 2020-01-23 DIAGNOSIS — M79602 Pain in left arm: Secondary | ICD-10-CM | POA: Insufficient documentation

## 2020-01-23 MED ORDER — SODIUM CHLORIDE 0.9 % IV SOLN
420.0000 mg | Freq: Once | INTRAVENOUS | Status: AC
  Administered 2020-01-23: 420 mg via INTRAVENOUS
  Filled 2020-01-23: qty 14

## 2020-01-23 MED ORDER — SODIUM CHLORIDE 0.9% FLUSH
10.0000 mL | INTRAVENOUS | Status: DC | PRN
  Administered 2020-01-23: 10 mL

## 2020-01-23 MED ORDER — HEPARIN SOD (PORK) LOCK FLUSH 100 UNIT/ML IV SOLN
500.0000 [IU] | Freq: Once | INTRAVENOUS | Status: AC | PRN
  Administered 2020-01-23: 500 [IU]

## 2020-01-23 MED ORDER — ACETAMINOPHEN 325 MG PO TABS
ORAL_TABLET | ORAL | Status: AC
  Filled 2020-01-23: qty 2

## 2020-01-23 MED ORDER — DIPHENHYDRAMINE HCL 25 MG PO CAPS
ORAL_CAPSULE | ORAL | Status: AC
  Filled 2020-01-23: qty 1

## 2020-01-23 MED ORDER — DIPHENHYDRAMINE HCL 25 MG PO CAPS
25.0000 mg | ORAL_CAPSULE | Freq: Once | ORAL | Status: AC
  Administered 2020-01-23: 25 mg via ORAL

## 2020-01-23 MED ORDER — TRASTUZUMAB-DKST CHEMO 150 MG IV SOLR
6.0000 mg/kg | Freq: Once | INTRAVENOUS | Status: AC
  Administered 2020-01-23: 420 mg via INTRAVENOUS
  Filled 2020-01-23: qty 20

## 2020-01-23 MED ORDER — ACETAMINOPHEN 325 MG PO TABS
650.0000 mg | ORAL_TABLET | Freq: Once | ORAL | Status: AC
  Administered 2020-01-23: 650 mg via ORAL

## 2020-01-23 MED ORDER — SODIUM CHLORIDE 0.9 % IV SOLN: Freq: Once | INTRAVENOUS | Status: AC

## 2020-01-23 NOTE — Progress Notes (Signed)
[place], [place] [id number]   CLINIC:  Medical Oncology/Hematology  PCP:  [name], MD [place] [id number] [phone]   REASON FOR VISIT:  HER-2 positive right breast cancer  CURRENT THERAPY: Maintenance Herceptin and Pertuzumab.    INTERVAL HISTORY:  Monica Hancock 39 y.o. female seen for follow-up of right breast cancer.  She reported pain in the left mid arm region for the past few weeks.  Pain also goes to the left scapular region.  Pain is slightly worse on motion but it is always there.  She also reported pain in the tailbone region if she sits too long.  Denies any tingling or numbness in the extremities.  Some nail changes were reported.  Appetite and energy levels are 100%.  Diarrhea improved.   REVIEW OF SYSTEMS:  Review of Systems  Musculoskeletal:       Pain in the left arm and tailbone region.  All other systems reviewed and are negative.    PAST MEDICAL/SURGICAL HISTORY:  Past Medical History:  Diagnosis Date  . BRCA1 positive   . Cancer [facility])    Right Breast  . Family history of adverse reaction to anesthesia    PONV-children  . Family history of BRCA1 gene positive   . Family history of breast cancer   . H/O Bell's palsy 2000   Left sided  . HSV infection    Past Surgical History:  Procedure Laterality Date  . BREAST BIOPSY Right   . IR IMAGING GUIDED PORT INSERTION  08/26/2019  . TUBAL LIGATION       SOCIAL HISTORY:  Social History   Socioeconomic History  . Marital status: Married    Spouse name: [name]  . Number of children: 5  . Years of education: Not on file  . Highest education level: 12th grade  Occupational History  . Not on file  Tobacco Use  . Smoking status: Never Smoker  . Smokeless tobacco: Never Used  Substance and Sexual Activity  . Alcohol use: No  . Drug use: No  . Sexual activity: Yes    Birth control/protection: Surgical    Comment: tubal  Other Topics  Concern  . Not on file  Social History Narrative  . Not on file   Social Determinants of Health   Financial Resource Strain: Medium Risk  . Difficulty of Paying Living Expenses: Somewhat hard  Food Insecurity: Food Insecurity Present  . Worried About Charity fundraiser in the Last Year: Sometimes true  . Ran Out of Food in the Last Year: Sometimes true  Transportation Needs: No Transportation Needs  . Lack of Transportation (Medical): No  . Lack of Transportation (Non-Medical): No  Physical Activity: Inactive  . Days of Exercise per Week: 0 days  . Minutes of Exercise per Session: 0 min  Stress: No Stress Concern Present  . Feeling of Stress : Only a little  Social Connections: Not Isolated  . Frequency of Communication with Friends and Family: More than three times a week  . Frequency of Social Gatherings with Friends and Family: More than three times a week  . Attends Religious Services: More than 4 times per year  . Active Member of Clubs or Organizations: Yes  . Attends Archivist Meetings: More than 4 times per year  . Marital Status: Married  Human resources officer Violence: Not At Risk  . Fear of Current or Ex-Partner: No  . Emotionally  Abused: No  . Physically Abused: No  . Sexually Abused: No    FAMILY HISTORY:  Family History  Problem Relation Age of Onset  . Diabetes Mother   . Breast cancer Mother 59  . Cancer Mother   . Diabetes Maternal Grandmother   . Stomach cancer Maternal Grandfather   . Breast cancer Maternal Aunt        dx in her 2s  . Diabetes Maternal Aunt     CURRENT MEDICATIONS:  Outpatient Encounter Medications as of 01/23/2020  Medication Sig Note  . diphenhydrAMINE (BENADRYL) 50 MG tablet Take 50 mg by mouth daily.   Marland Kitchen loratadine (CLARITIN) 10 MG tablet Take 10 mg by mouth daily. 01/23/2020: Last dose 12/10/19  . magnesium oxide (MAG-OX) 400 (241.3 Mg) MG tablet Take 1 tablet (400 mg total) by mouth 3 (three) times daily.   .  pertuzumab in sodium chloride 0.9 % 250 mL Inject into the vein every 21 ( twenty-one) days. 01/23/2020: Last dose 12/10/19  . potassium chloride SA (KLOR-CON) 10 MEQ tablet Take 2 tablets (20 mEq total) by mouth 2 (two) times daily.   . trastuzumab-dkst 2 mg/kg in sodium chloride 0.9 % 250 mL Inject into the vein every 21 ( twenty-one) days. 01/23/2020: Last dose 12/10/19  . [DISCONTINUED] CARBOPLATIN IV Inject into the vein every 21 ( twenty-one) days. 01/23/2020: Last dose 12/10/19  . [DISCONTINUED] DOCEtaxel (TAXOTERE IV) Inject into the vein every 21 ( twenty-one) days. 01/23/2020: Last dose 12/10/19  . cyclobenzaprine (FLEXERIL) 10 MG tablet 1/2 po qhs (Patient not taking: Reported on 01/23/2020)   . prochlorperazine (COMPAZINE) 10 MG tablet Take 1 tablet (10 mg total) by mouth every 6 (six) hours as needed for nausea or vomiting. (Patient not taking: Reported on 01/23/2020)    Facility-Administered Encounter Medications as of 01/23/2020  Medication  . [COMPLETED] heparin lock flush 100 unit/mL  . [COMPLETED] pertuzumab (PERJETA) 420 mg in sodium chloride 0.9 % 250 mL chemo infusion  . sodium chloride flush (NS) 0.9 % injection 10 mL  . [COMPLETED] trastuzumab-dkst (OGIVRI) 420 mg in sodium chloride 0.9 % 250 mL chemo infusion    ALLERGIES:  No Known Allergies   PHYSICAL EXAM:  ECOG Performance status: 0  There were no vitals filed for this visit. There were no vitals filed for this visit.  Physical Exam Vitals reviewed.  Constitutional:      Appearance: Normal appearance.  Cardiovascular:     Rate and Rhythm: Normal rate and regular rhythm.     Heart sounds: Normal heart sounds.  Pulmonary:     Effort: Pulmonary effort is normal.     Breath sounds: Normal breath sounds.  Abdominal:     General: There is no distension.     Palpations: Abdomen is soft. There is no mass.  Musculoskeletal:        General: No swelling.  Lymphadenopathy:     Cervical: No cervical adenopathy.  Skin:     General: Skin is warm.  Neurological:     General: No focal deficit present.     Mental Status: She is alert and oriented to person, place, and time.  Psychiatric:        Mood and Affect: Mood normal.        Behavior: Behavior normal.    LABORATORY DATA:  I have reviewed the labs as listed.  CBC    Component Value Date/Time   WBC 6.9 01/22/2020 1347   RBC 3.61 (L) 01/22/2020 1347  HGB 11.1 (L) 01/22/2020 1347   HCT 34.5 (L) 01/22/2020 1347   PLT 325 01/22/2020 1347   MCV 95.6 01/22/2020 1347   MCH 30.7 01/22/2020 1347   MCHC 32.2 01/22/2020 1347   RDW 13.4 01/22/2020 1347   LYMPHSABS 2.2 01/22/2020 1347   MONOABS 0.5 01/22/2020 1347   EOSABS 0.1 01/22/2020 1347   BASOSABS 0.0 01/22/2020 1347   CMP Latest Ref Rng & Units 01/02/2020 12/10/2019 11/19/2019  Glucose 70 - 99 mg/dL 116(H) 104(H) 115(H)  BUN 6 - 20 mg/dL 11 16 11   Creatinine 0.44 - 1.00 mg/dL 0.64 0.54 0.59  Sodium 135 - 145 mmol/L 140 141 141  Potassium 3.5 - 5.1 mmol/L 3.3(L) 3.4(L) 3.2(L)  Chloride 98 - 111 mmol/L 109 108 106  CO2 22 - 32 mmol/L 26 25 24   Calcium 8.9 - 10.3 mg/dL 9.3 9.3 9.6  Total Protein 6.5 - 8.1 g/dL 6.4(L) 7.2 7.1  Total Bilirubin 0.3 - 1.2 mg/dL 0.6 0.5 0.4  Alkaline Phos 38 - 126 U/L 59 67 62  AST 15 - 41 U/L 25 22 28   ALT 0 - 44 U/L 22 22 34       DIAGNOSTIC IMAGING:  I have independently reviewed scans and discussed with the patient.    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 -Herceptin and Pertuzumab started on 01/02/2020. -She reported pain in her left arm for the past few weeks.  Pain also goes to the left scapula.  It is constantly present. -She also reported pain in the tailbone region upon prolonged sitting. -We have done a bone scan and reviewed results.  It did  not show any evidence of osseous metastatic disease.  Uptake at shoulders, sternoclavicular joints typically degenerative. -I have reviewed her recent CBC.  Hemoglobin improved to 11.1 and normal white count and platelets. -She will proceed with Herceptin and Pertuzumab today. -She will proceed with bilateral mastectomies tomorrow.  I will see her back in 3 weeks for follow-up to discuss further treatment. -If she has complete pathological response, she will be started on antiestrogen therapy.  If she has residual disease, she will be switched to Kadcyla.  2.  BRCA1 positivity: -She is undergoing bilateral mastectomies tomorrow.  She has already seen [name] who will schedule her for bilateral nephrectomy.  3.  Hypokalemia: -She will continue potassium 20 mEq twice daily.  4.  Hypomagnesemia: -She will continue magnesium 3 times a day.  5.  High risk drug monitoring: -2D echocardiogram on 12/30/2019 shows EF 60-65%.   Orders placed this encounter:  No orders of the defined types were placed in this encounter.     Derek Jack, MD [place] [phone]

## 2020-01-23 NOTE — Progress Notes (Signed)
Labs not needed today, drawn 3 weeks ago.

## 2020-01-23 NOTE — Progress Notes (Signed)
Treatment given per orders. Patient tolerated it well without problems. Vitals stable and discharged home from clinic ambulatory. Follow up as scheduled.  

## 2020-01-23 NOTE — Assessment & Plan Note (Signed)
1.  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 -Herceptin and Pertuzumab started on 01/02/2020. -She reported pain in her left arm for the past few weeks.  Pain also goes to the left scapula.  It is constantly present. -She also reported pain in the tailbone region upon prolonged sitting. -We have done a bone scan and reviewed results.  It did not show any evidence of osseous metastatic disease.  Uptake at shoulders, sternoclavicular joints typically degenerative. -I have reviewed her recent CBC.  Hemoglobin improved to 11.1 and normal white count and platelets. -She will proceed with Herceptin and Pertuzumab today. -She will proceed with bilateral mastectomies tomorrow.  I will see her back in 3 weeks for follow-up to discuss further treatment. -If she has complete pathological response, she will be started on antiestrogen therapy.  If she has residual disease, she will be switched to Kadcyla.  2.  BRCA1 positivity: -She is undergoing bilateral mastectomies tomorrow.  She has already seen [name] who will schedule her for bilateral nephrectomy.  3.  Hypokalemia: -She will continue potassium 20 mEq twice daily.  4.  Hypomagnesemia: -She will continue magnesium 3 times a day.  5.  High risk drug monitoring: -2D echocardiogram on 12/30/2019 shows EF 60-65%.

## 2020-01-23 NOTE — Patient Instructions (Signed)
[facility] Discharge Instructions for Patients Receiving Chemotherapy  Today you received the following chemotherapy agents   To help prevent nausea and vomiting after your treatment, we encourage you to take your nausea medication   If you develop nausea and vomiting that is not controlled by your nausea medication, call the clinic.   BELOW ARE SYMPTOMS THAT SHOULD BE REPORTED IMMEDIATELY:  *FEVER GREATER THAN 100.5 F  *CHILLS WITH OR WITHOUT FEVER  NAUSEA AND VOMITING THAT IS NOT CONTROLLED WITH YOUR NAUSEA MEDICATION  *UNUSUAL SHORTNESS OF BREATH  *UNUSUAL BRUISING OR BLEEDING  TENDERNESS IN MOUTH AND THROAT WITH OR WITHOUT PRESENCE OF ULCERS  *URINARY PROBLEMS  *BOWEL PROBLEMS  UNUSUAL RASH Items with * indicate a potential emergency and should be followed up as soon as possible.  Feel free to call the clinic should you have any questions or concerns. The clinic phone number is [phone].  Please show the CHEMO ALERT CARD at check-in to the Emergency Department and triage nurse.   

## 2020-01-23 NOTE — Progress Notes (Signed)
Patient has been assessed, vital signs and labs have been reviewed by Dr. [name]. ANC, Creatinine, LFTs, and Platelets are within treatment parameters per Dr. [name]. The patient is good to proceed with treatment at this time.  

## 2020-01-23 NOTE — Patient Instructions (Addendum)
[place] at [facility] Discharge Instructions  You were seen today by Dr. Delton Coombes. He went over your recent lab results. Try taking Aleve for the left arm pain. He will see you back in 3 weeks for labs, treatment and follow up.   Thank you for choosing [place] at [facility] to provide your oncology and hematology care.  To afford each patient quality time with our provider, please arrive at least 15 minutes before your scheduled appointment time.   If you have a lab appointment with the [facility] please come in thru the  Main Entrance and check in at the main information desk  You need to re-schedule your appointment should you arrive 10 or more minutes late.  We strive to give you quality time with our providers, and arriving late affects you and other patients whose appointments are after yours.  Also, if you no show three or more times for appointments you may be dismissed from the clinic at the providers discretion.     Again, thank you for choosing [facility].  Our hope is that these requests will decrease the amount of time that you wait before being seen by our physicians.       _____________________________________________________________  Should you have questions after your visit to [facility], please contact our office at [phone] between the hours of 8:00 a.m. and 4:30 p.m.  Voicemails left after 4:00 p.m. will not be returned until the following business day.  For prescription refill requests, have your pharmacy contact our office and allow 72 hours.    Cancer Center Support Programs:   > Cancer Support Group  2nd Tuesday of the month 1pm-2pm, Journey Room

## 2020-01-24 ENCOUNTER — Inpatient Hospital Stay (HOSPITAL_COMMUNITY): Payer: Medicaid Other | Admitting: Anesthesiology

## 2020-01-24 ENCOUNTER — Other Ambulatory Visit: Payer: Self-pay

## 2020-01-24 ENCOUNTER — Encounter (HOSPITAL_COMMUNITY): Payer: Self-pay | Admitting: General Surgery

## 2020-01-24 ENCOUNTER — Encounter (HOSPITAL_COMMUNITY)
Admission: RE | Disposition: A | Discharge status: Home / Self Care | Payer: Self-pay | Source: Home / Self Care | Attending: General Surgery

## 2020-01-24 ENCOUNTER — Inpatient Hospital Stay (HOSPITAL_COMMUNITY)
Admission: RE | Admit: 2020-01-24 | Discharge: 2020-01-26 | DRG: 580 | Disposition: A | Discharge status: Home / Self Care | Payer: Medicaid Other | Attending: General Surgery | Admitting: General Surgery

## 2020-01-24 DIAGNOSIS — Z9013 Acquired absence of bilateral breasts and nipples: Secondary | ICD-10-CM

## 2020-01-24 DIAGNOSIS — D649 Anemia, unspecified: Secondary | ICD-10-CM | POA: Diagnosis present

## 2020-01-24 DIAGNOSIS — Z4001 Encounter for prophylactic removal of breast: Secondary | ICD-10-CM

## 2020-01-24 DIAGNOSIS — Z1501 Genetic susceptibility to malignant neoplasm of breast: Secondary | ICD-10-CM | POA: Diagnosis not present

## 2020-01-24 DIAGNOSIS — Z803 Family history of malignant neoplasm of breast: Secondary | ICD-10-CM | POA: Diagnosis not present

## 2020-01-24 DIAGNOSIS — Z1502 Genetic susceptibility to malignant neoplasm of ovary: Secondary | ICD-10-CM

## 2020-01-24 DIAGNOSIS — Z79899 Other long term (current) drug therapy: Secondary | ICD-10-CM | POA: Diagnosis not present

## 2020-01-24 DIAGNOSIS — Z1509 Genetic susceptibility to other malignant neoplasm: Secondary | ICD-10-CM | POA: Diagnosis not present

## 2020-01-24 DIAGNOSIS — C50411 Malignant neoplasm of upper-outer quadrant of right female breast: Principal | ICD-10-CM

## 2020-01-24 DIAGNOSIS — D62 Acute posthemorrhagic anemia: Secondary | ICD-10-CM | POA: Diagnosis not present

## 2020-01-24 DIAGNOSIS — D0511 Intraductal carcinoma in situ of right breast: Secondary | ICD-10-CM

## 2020-01-24 HISTORY — PX: SIMPLE MASTECTOMY WITH AXILLARY SENTINEL NODE BIOPSY: SHX6098

## 2020-01-24 HISTORY — PX: MASTECTOMY MODIFIED RADICAL: SHX5962

## 2020-01-24 SURGERY — SIMPLE MASTECTOMY
Anesthesia: General | Laterality: Right

## 2020-01-24 MED ORDER — PROPOFOL 10 MG/ML IV BOLUS
INTRAVENOUS | Status: DC | PRN
  Administered 2020-01-24: 50 mg via INTRAVENOUS
  Administered 2020-01-24: 150 mg via INTRAVENOUS

## 2020-01-24 MED ORDER — PROPOFOL 10 MG/ML IV BOLUS
INTRAVENOUS | Status: AC
  Filled 2020-01-24: qty 40

## 2020-01-24 MED ORDER — MEPERIDINE HCL 50 MG/ML IJ SOLN
INTRAMUSCULAR | Status: AC
  Filled 2020-01-24: qty 1

## 2020-01-24 MED ORDER — POVIDONE-IODINE 10 % EX OINT
TOPICAL_OINTMENT | CUTANEOUS | Status: AC
  Filled 2020-01-24: qty 2

## 2020-01-24 MED ORDER — SODIUM CHLORIDE 0.9 % IV SOLN
INTRAVENOUS | Status: DC | PRN
  Administered 2020-01-24: 20 mL

## 2020-01-24 MED ORDER — HYDROMORPHONE HCL 1 MG/ML IJ SOLN: 1.0000 mg | INTRAMUSCULAR | Status: DC | PRN

## 2020-01-24 MED ORDER — GLYCOPYRROLATE 0.2 MG/ML IJ SOLN
INTRAMUSCULAR | Status: DC | PRN
  Administered 2020-01-24: .1 mg via INTRAVENOUS

## 2020-01-24 MED ORDER — SUGAMMADEX SODIUM 200 MG/2ML IV SOLN
INTRAVENOUS | Status: DC | PRN
  Administered 2020-01-24: 130 mg via INTRAVENOUS

## 2020-01-24 MED ORDER — KETOROLAC TROMETHAMINE 30 MG/ML IJ SOLN
30.0000 mg | Freq: Four times a day (QID) | INTRAMUSCULAR | Status: DC | PRN
  Administered 2020-01-24: 16:00:00 30 mg via INTRAVENOUS
  Filled 2020-01-24: qty 1

## 2020-01-24 MED ORDER — PROMETHAZINE HCL 25 MG/ML IJ SOLN: 6.2500 mg | INTRAMUSCULAR | Status: DC | PRN

## 2020-01-24 MED ORDER — ENOXAPARIN SODIUM 40 MG/0.4ML ~~LOC~~ SOLN
40.0000 mg | SUBCUTANEOUS | Status: DC
  Administered 2020-01-25 – 2020-01-26 (×2): 40 mg via SUBCUTANEOUS
  Filled 2020-01-24 (×3): qty 0.4

## 2020-01-24 MED ORDER — ONDANSETRON HCL 4 MG/2ML IJ SOLN
INTRAMUSCULAR | Status: AC
  Filled 2020-01-24: qty 2

## 2020-01-24 MED ORDER — DIPHENHYDRAMINE HCL 50 MG/ML IJ SOLN: 25.0000 mg | Freq: Four times a day (QID) | INTRAMUSCULAR | Status: DC | PRN

## 2020-01-24 MED ORDER — MIDAZOLAM HCL 2 MG/2ML IJ SOLN
INTRAMUSCULAR | Status: AC
  Filled 2020-01-24: qty 2

## 2020-01-24 MED ORDER — MEPERIDINE HCL 50 MG/ML IJ SOLN
6.2500 mg | INTRAMUSCULAR | Status: DC | PRN
  Administered 2020-01-24 (×2): 12.5 mg via INTRAVENOUS

## 2020-01-24 MED ORDER — SIMETHICONE 80 MG PO CHEW: 40.0000 mg | CHEWABLE_TABLET | Freq: Four times a day (QID) | ORAL | Status: DC | PRN

## 2020-01-24 MED ORDER — BUPIVACAINE LIPOSOME 1.3 % IJ SUSP
INTRAMUSCULAR | Status: AC
  Filled 2020-01-24: qty 20

## 2020-01-24 MED ORDER — ACETAMINOPHEN 650 MG RE SUPP: 650.0000 mg | Freq: Four times a day (QID) | RECTAL | Status: DC | PRN

## 2020-01-24 MED ORDER — CHLORHEXIDINE GLUCONATE CLOTH 2 % EX PADS: 6.0000 | MEDICATED_PAD | Freq: Once | CUTANEOUS | Status: DC

## 2020-01-24 MED ORDER — LACTATED RINGERS IV SOLN: INTRAVENOUS | Status: DC | PRN

## 2020-01-24 MED ORDER — ENOXAPARIN SODIUM 40 MG/0.4ML ~~LOC~~ SOLN
40.0000 mg | Freq: Once | SUBCUTANEOUS | Status: AC
  Administered 2020-01-24: 07:00:00 40 mg via SUBCUTANEOUS

## 2020-01-24 MED ORDER — 0.9 % SODIUM CHLORIDE (POUR BTL) OPTIME
TOPICAL | Status: DC | PRN
  Administered 2020-01-24: 1000 mL

## 2020-01-24 MED ORDER — ACETAMINOPHEN 325 MG PO TABS: 650.0000 mg | ORAL_TABLET | Freq: Four times a day (QID) | ORAL | Status: DC | PRN

## 2020-01-24 MED ORDER — LORAZEPAM 2 MG/ML IJ SOLN: 1.0000 mg | INTRAMUSCULAR | Status: DC | PRN

## 2020-01-24 MED ORDER — SODIUM CHLORIDE (PF) 0.9 % IJ SOLN
INTRAMUSCULAR | Status: AC
  Filled 2020-01-24: qty 20

## 2020-01-24 MED ORDER — BUPIVACAINE LIPOSOME 1.3 % IJ SUSP: INTRAMUSCULAR | Status: DC | PRN

## 2020-01-24 MED ORDER — KETOROLAC TROMETHAMINE 30 MG/ML IJ SOLN: 30.0000 mg | Freq: Four times a day (QID) | INTRAMUSCULAR | Status: AC

## 2020-01-24 MED ORDER — SODIUM CHLORIDE 0.9 % IV SOLN: INTRAVENOUS | Status: DC

## 2020-01-24 MED ORDER — SCOPOLAMINE 1 MG/3DAYS TD PT72
1.0000 | MEDICATED_PATCH | Freq: Once | TRANSDERMAL | Status: DC
  Administered 2020-01-24: 07:00:00 1.5 mg via TRANSDERMAL
  Filled 2020-01-24: qty 1

## 2020-01-24 MED ORDER — HYDROMORPHONE HCL 1 MG/ML IJ SOLN
0.2500 mg | INTRAMUSCULAR | Status: DC | PRN
  Administered 2020-01-24 (×2): 0.5 mg via INTRAVENOUS
  Filled 2020-01-24 (×2): qty 0.5

## 2020-01-24 MED ORDER — HYDROMORPHONE HCL 1 MG/ML IJ SOLN: 0.2500 mg | INTRAMUSCULAR | Status: DC | PRN

## 2020-01-24 MED ORDER — ONDANSETRON HCL 4 MG/2ML IJ SOLN: 4.0000 mg | Freq: Four times a day (QID) | INTRAMUSCULAR | Status: DC | PRN

## 2020-01-24 MED ORDER — OXYCODONE-ACETAMINOPHEN 5-325 MG PO TABS
1.0000 | ORAL_TABLET | ORAL | Status: DC | PRN
  Administered 2020-01-24: 17:00:00 2 via ORAL
  Administered 2020-01-24: 13:00:00 1 via ORAL
  Administered 2020-01-24 – 2020-01-26 (×7): 2 via ORAL
  Filled 2020-01-24 (×10): qty 2

## 2020-01-24 MED ORDER — DIPHENHYDRAMINE HCL 25 MG PO CAPS: 25.0000 mg | ORAL_CAPSULE | Freq: Four times a day (QID) | ORAL | Status: DC | PRN

## 2020-01-24 MED ORDER — SODIUM CHLORIDE (PF) 0.9 % IJ SOLN
INTRAMUSCULAR | Status: AC
  Filled 2020-01-24: qty 10

## 2020-01-24 MED ORDER — CEFAZOLIN SODIUM-DEXTROSE 2-4 GM/100ML-% IV SOLN
2.0000 g | INTRAVENOUS | Status: AC
  Administered 2020-01-24: 2 g via INTRAVENOUS

## 2020-01-24 MED ORDER — FENTANYL CITRATE (PF) 250 MCG/5ML IJ SOLN
INTRAMUSCULAR | Status: AC
  Filled 2020-01-24: qty 5

## 2020-01-24 MED ORDER — MIDAZOLAM HCL 2 MG/2ML IJ SOLN
2.0000 mg | Freq: Once | INTRAMUSCULAR | Status: AC
  Administered 2020-01-24: 07:00:00 2 mg via INTRAVENOUS

## 2020-01-24 MED ORDER — LIDOCAINE HCL (CARDIAC) PF 100 MG/5ML IV SOSY
PREFILLED_SYRINGE | INTRAVENOUS | Status: DC | PRN
  Administered 2020-01-24: 100 mg via INTRAVENOUS

## 2020-01-24 MED ORDER — DEXAMETHASONE SODIUM PHOSPHATE 10 MG/ML IJ SOLN
INTRAMUSCULAR | Status: AC
  Filled 2020-01-24: qty 1

## 2020-01-24 MED ORDER — CEFAZOLIN SODIUM-DEXTROSE 2-4 GM/100ML-% IV SOLN
INTRAVENOUS | Status: AC
  Filled 2020-01-24: qty 100

## 2020-01-24 MED ORDER — ONDANSETRON 4 MG PO TBDP: 4.0000 mg | ORAL_TABLET | Freq: Four times a day (QID) | ORAL | Status: DC | PRN

## 2020-01-24 MED ORDER — POVIDONE-IODINE 10 % OINT PACKET
TOPICAL_OINTMENT | CUTANEOUS | Status: DC | PRN
  Administered 2020-01-24: 2 via TOPICAL

## 2020-01-24 MED ORDER — ROCURONIUM BROMIDE 100 MG/10ML IV SOLN
INTRAVENOUS | Status: DC | PRN
  Administered 2020-01-24: 50 mg via INTRAVENOUS
  Administered 2020-01-24: 10 mg via INTRAVENOUS

## 2020-01-24 MED ORDER — DEXAMETHASONE SODIUM PHOSPHATE 4 MG/ML IJ SOLN
INTRAMUSCULAR | Status: DC | PRN
  Administered 2020-01-24: 10 mg via INTRAVENOUS

## 2020-01-24 MED ORDER — FENTANYL CITRATE (PF) 100 MCG/2ML IJ SOLN
INTRAMUSCULAR | Status: DC | PRN
  Administered 2020-01-24 (×3): 50 ug via INTRAVENOUS

## 2020-01-24 MED ORDER — MEPERIDINE HCL 50 MG/ML IJ SOLN: 6.2500 mg | INTRAMUSCULAR | Status: DC | PRN

## 2020-01-24 MED ORDER — ENOXAPARIN SODIUM 40 MG/0.4ML ~~LOC~~ SOLN
SUBCUTANEOUS | Status: AC
  Filled 2020-01-24: qty 0.4

## 2020-01-24 MED ORDER — LACTATED RINGERS IV SOLN: Freq: Once | INTRAVENOUS | Status: AC

## 2020-01-24 MED ORDER — CHLORHEXIDINE GLUCONATE CLOTH 2 % EX PADS
6.0000 | MEDICATED_PAD | Freq: Every day | CUTANEOUS | Status: DC
  Administered 2020-01-24 – 2020-01-26 (×2): 6 via TOPICAL

## 2020-01-24 MED ORDER — ONDANSETRON HCL 4 MG/2ML IJ SOLN
INTRAMUSCULAR | Status: DC | PRN
  Administered 2020-01-24: 4 mg via INTRAVENOUS

## 2020-01-24 SURGICAL SUPPLY — 50 items
APL PRP STRL LF DISP 70% ISPRP (MISCELLANEOUS) ×2
APPLIER CLIP 11 MED OPEN (CLIP) ×8
APR CLP MED 11 20 MLT OPN (CLIP) ×4
BINDER BREAST LRG (GAUZE/BANDAGES/DRESSINGS) ×2 IMPLANT
CHLORAPREP W/TINT 26 (MISCELLANEOUS) ×4 IMPLANT
CLIP APPLIE 11 MED OPEN (CLIP) IMPLANT
CLOTH BEACON ORANGE TIMEOUT ST (SAFETY) ×4 IMPLANT
COVER LIGHT HANDLE STERIS (MISCELLANEOUS) ×8 IMPLANT
COVER WAND RF STERILE (DRAPES) ×4 IMPLANT
DRAPE HALF SHEET 40X57 (DRAPES) ×6 IMPLANT
ELECT REM PT RETURN 9FT ADLT (ELECTROSURGICAL) ×4
ELECTRODE REM PT RTRN 9FT ADLT (ELECTROSURGICAL) ×2 IMPLANT
EVACUATOR DRAINAGE 10X20 100CC (DRAIN) ×2 IMPLANT
EVACUATOR SILICONE 100CC (DRAIN) ×8
GLOVE BIOGEL PI IND STRL 6.5 (GLOVE) IMPLANT
GLOVE BIOGEL PI IND STRL 7.0 (GLOVE) ×6 IMPLANT
GLOVE BIOGEL PI INDICATOR 6.5 (GLOVE) ×2
GLOVE BIOGEL PI INDICATOR 7.0 (GLOVE) ×6
GLOVE ECLIPSE 6.5 STRL STRAW (GLOVE) ×6 IMPLANT
GLOVE SURG SS PI 7.5 STRL IVOR (GLOVE) ×4 IMPLANT
GOWN STRL REUS W/ TWL LRG LVL3 (GOWN DISPOSABLE) IMPLANT
GOWN STRL REUS W/TWL LRG LVL3 (GOWN DISPOSABLE) ×16 IMPLANT
INST SET MINOR GENERAL (KITS) ×4 IMPLANT
KIT TURNOVER KIT A (KITS) ×4 IMPLANT
MANIFOLD NEPTUNE II (INSTRUMENTS) ×4 IMPLANT
NDL HYPO 18GX1.5 BLUNT FILL (NEEDLE) ×2 IMPLANT
NEEDLE HYPO 18GX1.5 BLUNT FILL (NEEDLE) ×4 IMPLANT
NEEDLE HYPO 22GX1.5 SAFETY (NEEDLE) ×4 IMPLANT
NS IRRIG 1000ML POUR BTL (IV SOLUTION) ×4 IMPLANT
PACK MINOR (CUSTOM PROCEDURE TRAY) ×4 IMPLANT
PAD ABD 8X10 STRL (GAUZE/BANDAGES/DRESSINGS) ×6 IMPLANT
PAD ARMBOARD 7.5X6 YLW CONV (MISCELLANEOUS) ×4 IMPLANT
PENCIL SMOKE EVACUATOR COATED (MISCELLANEOUS) ×2 IMPLANT
SET BASIN LINEN APH (SET/KITS/TRAYS/PACK) ×4 IMPLANT
SPONGE DRAIN TRACH 4X4 STRL 2S (GAUZE/BANDAGES/DRESSINGS) ×6 IMPLANT
SPONGE INTESTINAL PEANUT (DISPOSABLE) ×4 IMPLANT
SPONGE LAP 18X18 RF (DISPOSABLE) ×8 IMPLANT
STAPLER VISISTAT (STAPLE) ×6 IMPLANT
SUT ETHILON 3 0 FSL (SUTURE) ×6 IMPLANT
SUT MNCRL AB 4-0 PS2 18 (SUTURE) ×4 IMPLANT
SUT SILK 2 0 (SUTURE) ×4
SUT SILK 2 0 SH (SUTURE) ×6 IMPLANT
SUT SILK 2-0 18XBRD TIE 12 (SUTURE) ×2 IMPLANT
SUT VIC AB 2-0 CT1 27 (SUTURE) ×32
SUT VIC AB 2-0 CT1 TAPERPNT 27 (SUTURE) ×8 IMPLANT
SUT VIC AB 3-0 SH 27 (SUTURE) ×4
SUT VIC AB 3-0 SH 27X BRD (SUTURE) ×2 IMPLANT
SUT VICRYL AB 2 0 TIES (SUTURE) IMPLANT
SYR 20ML LL LF (SYRINGE) ×8 IMPLANT
SYR BULB IRRIGATION 50ML (SYRINGE) ×4 IMPLANT

## 2020-01-24 NOTE — Op Note (Signed)
Patient:  Monica Hancock  DOB:  1981-10-31  MRN:  [id number]   Preop Diagnosis: Right breast cancer, BRCA1  Postop Diagnosis: Same  Procedure: Right modified radical mastectomy, left simple mastectomy  Surgeon: Aviva Signs, MD  Anes: General endotracheal  Indications: Patient is a 39 year old Hispanic female who has undergone neoadjuvant therapy for invasive ductal carcinoma of the right breast who was also found to be BRCA1 on genetic testing.  She now presents for a right modified radical mastectomy with left simple mastectomy.  The risks and benefits of the procedures including bleeding, infection, arm pain, and the possibility of a blood transfusion were fully explained to the patient, who gave informed consent.  Procedure note: The patient was placed in supine position.  After induction of general endotracheal anesthesia, both breasts and axillas were prepped and draped using usual sterile technique with ChloraPrep.  Surgical site confirmation was performed.  Elliptical incision was made around the right nipple.  The superior flap was formed up to the clavicle, avoiding the Port-A-Cath that was in place in the right upper chest.  An inferior flap was formed to the chest wall.  The right breast was then removed medial to lateral from the pectoralis major muscle along the line of the fascia using Bovie electrocautery.  A suture was placed superiorly for orientation purposes.  The right breast was removed and sent to pathology for examination.  A level 2 axillary dissection was performed.  Care was taken to avoid the thoracodorsal artery and vein and nerve, axillary vein, and long thoracic nerves.  I did think I felt 1 palpable lymph node.  The specimen was removed and sent to pathology for further examination.  And bleeding was controlled using medium clips.  A #10 flat Jackson-Pratt drain was placed along the flap and brought out through separate stab wound inferior to the incision line.   It was secured to the skin level using a 3-0 nylon suture.  The wound was irrigated with normal saline.  The wound was injected with Exparel.  The subcutaneous layer was reapproximated using 2-0 Vicryl interrupted sutures.  The skin was closed using staples.  Next, we turned our attention to the left breast.  A left simple mastectomy was performed.  An elliptical incision was made around the left nipple.  A superior flap was formed up to the clavicle and an inferior flap formed to the chest wall.  The left breast was then removed medial to lateral from the pectoralis major muscle and chest wall along the fascial line.  A suture was placed superiorly for orientation purposes.  The specimen was sent to pathology further examination.  A bleeding was controlled using Bovie electrocautery.  The wound was irrigated with normal saline.  Exparel was instilled into the surrounding wound.  The subcutaneous layer was reapproximated using 2-0 Vicryl interrupted sutures.  The skin was closed using staples.  Betadine ointment and dry sterile dressings were applied.  All tape needle counts were correct at the end of the procedure.  The patient was extubated in the operating room and transferred to PACU in stable condition.  Complications: None  EBL: 250 cc  Specimen: Right breast and left axillary contents, left breast  Drains: Jackson-Pratt drains x2 to breast flaps

## 2020-01-24 NOTE — Progress Notes (Signed)
During assessment, patient questioned her medication. She stated that she takes Mag & K+ daily. She stated she had not been given any of her home medications while she has been admitted. I noticed they were not ordered to be given. I paged MD regarding patient's concern over her home medication regimen. Awaiting orders.

## 2020-01-24 NOTE — Interval H&P Note (Signed)
History and Physical Interval Note:  01/24/2020 6:53 AM  Monica Hancock  has presented today for surgery, with the diagnosis of Right breast cancer, [place] cancer gene 1 positive.  The various methods of treatment have been discussed with the patient and family. After consideration of risks, benefits and other options for treatment, the patient has consented to  Procedure(s): SIMPLE MASTECTOMY (Left) MASTECTOMY MODIFIED RADICAL (Right) as a surgical intervention.  The patient's history has been reviewed, patient examined, no change in status, stable for surgery.  I have reviewed the patient's chart and labs.  Questions were answered to the patient's satisfaction.     Aviva Signs

## 2020-01-24 NOTE — Anesthesia Procedure Notes (Signed)
Procedure Name: Intubation Date/Time: 01/24/2020 7:43 AM Performed by: Georgeanne Nim, CRNA Pre-anesthesia Checklist: Patient identified, Emergency Drugs available, Suction available, Patient being monitored and Timeout performed Patient Re-evaluated:Patient Re-evaluated prior to induction Oxygen Delivery Method: Circle system utilized Preoxygenation: Pre-oxygenation with 100% oxygen Induction Type: IV induction Ventilation: Mask ventilation without difficulty Grade View: Grade II Tube size: 7.0 mm Number of attempts: 1 Airway Equipment and Method: Stylet Placement Confirmation: ETT inserted through vocal cords under direct vision,  positive ETCO2,  CO2 detector and breath sounds checked- equal and bilateral Secured at: 20 cm Tube secured with: Tape Dental Injury: Teeth and Oropharynx as per pre-operative assessment

## 2020-01-24 NOTE — Transfer of Care (Signed)
Immediate Anesthesia Transfer of Care Note  Patient: Monica Hancock  Procedure(s) Performed: LEFT SIMPLE MASTECTOMY (Left ) RIGHT MASTECTOMY MODIFIED RADICAL (Right )  Patient Location: PACU  Anesthesia Type:General  Level of Consciousness: awake and patient cooperative  Airway & Oxygen Therapy: Patient Spontanous Breathing  Post-op Assessment: Report given to RN and Post -op Vital signs reviewed and stable  Post vital signs: Reviewed and stable  Last Vitals:  Vitals Value Taken Time  BP    Temp    Pulse 99 01/24/20 1008  Resp 0 01/24/20 1006  SpO2 100 % 01/24/20 1008  Vitals shown include unvalidated device data.  Last Pain:  Vitals:   01/24/20 0709  TempSrc: Oral  PainSc: 0-No pain         Complications: No apparent anesthesia complications

## 2020-01-24 NOTE — Anesthesia Preprocedure Evaluation (Addendum)
Anesthesia Evaluation  Patient identified by MRN, date of birth, ID band Patient awake    Reviewed: Allergy & Precautions, NPO status , Patient's Chart, lab work & pertinent test results  History of Anesthesia Complications (+) Family history of anesthesia reaction and history of anesthetic complications  Airway Mallampati: II  TM Distance: >3 FB Neck ROM: Full    Dental no notable dental hx. (+) Teeth Intact   Pulmonary neg pulmonary ROS,    Pulmonary exam normal breath sounds clear to auscultation       Cardiovascular Exercise Tolerance: Good negative cardio ROS   Rhythm:Regular Rate:Normal  Left ventricular ejection fraction, by estimation, is 60 to 65%. The  left ventricle has normal function. Left ventricular diastolic parameters  are indeterminate.  2. Right ventricular systolic function is normal. There is normal  pulmonary artery systolic pressure. The estimated right ventricular  systolic pressure is 123XX123 mmHg.  3. The aortic valve is tricuspid.  4. No significant change in LVEF compared with prior study from September  2020.    Neuro/Psych negative neurological ROS  negative psych ROS   GI/Hepatic negative GI ROS, Neg liver ROS,   Endo/Other  negative endocrine ROS  Renal/GU negative Renal ROS     Musculoskeletal negative musculoskeletal ROS (+)   Abdominal   Peds  Hematology negative hematology ROS (+)   Anesthesia Other Findings Right breast cancer  Reproductive/Obstetrics negative OB ROS                            Anesthesia Physical Anesthesia Plan  ASA: III  Anesthesia Plan: General   Post-op Pain Management:    Induction: Intravenous  PONV Risk Score and Plan: 4 or greater and Ondansetron, Dexamethasone, Midazolam and Scopolamine patch - Pre-op  Airway Management Planned: Oral ETT  Additional Equipment:   Intra-op Plan:   Post-operative Plan:  Extubation in OR  Informed Consent: I have reviewed the patients History and Physical, chart, labs and discussed the procedure including the risks, benefits and alternatives for the proposed anesthesia with the patient or authorized representative who has indicated his/her understanding and acceptance.       Plan Discussed with: CRNA and Surgeon  Anesthesia Plan Comments:        Anesthesia Quick Evaluation

## 2020-01-25 LAB — BASIC METABOLIC PANEL
Anion gap: 6 (ref 5–15)
BUN: 9 mg/dL (ref 6–20)
CO2: 26 mmol/L (ref 22–32)
Calcium: 9.2 mg/dL (ref 8.9–10.3)
Chloride: 105 mmol/L (ref 98–111)
Creatinine, Ser: 0.7 mg/dL (ref 0.44–1.00)
GFR calc Af Amer: >60 mL/min (ref >60)
GFR calc non Af Amer: >60 mL/min (ref >60)
Glucose, Bld: 135 mg/dL — ABNORMAL HIGH (ref 70–99)
Potassium: 3.6 mmol/L (ref 3.5–5.1)
Sodium: 137 mmol/L (ref 135–145)

## 2020-01-25 LAB — CBC
HCT: 27.4 % — ABNORMAL LOW (ref 36.0–46.0)
Hemoglobin: 8.5 g/dL — ABNORMAL LOW (ref 12.0–15.0)
MCH: 30.4 pg (ref 26.0–34.0)
MCHC: 31 g/dL (ref 30.0–36.0)
MCV: 97.9 fL (ref 80.0–100.0)
Platelets: 245 10*3/uL (ref 150–400)
RBC: 2.8 MIL/uL — ABNORMAL LOW (ref 3.87–5.11)
RDW: 13.3 % (ref 11.5–15.5)
WBC: 8.6 10*3/uL (ref 4.0–10.5)
nRBC: 0 % (ref 0.0–0.2)

## 2020-01-25 MED ORDER — MAGNESIUM OXIDE 400 (241.3 MG) MG PO TABS
400.0000 mg | ORAL_TABLET | Freq: Three times a day (TID) | ORAL | Status: DC
  Administered 2020-01-25 – 2020-01-26 (×4): 400 mg via ORAL
  Filled 2020-01-25 (×4): qty 1

## 2020-01-25 MED ORDER — POTASSIUM CHLORIDE 20 MEQ PO PACK
20.0000 meq | PACK | Freq: Two times a day (BID) | ORAL | Status: DC
  Administered 2020-01-25 – 2020-01-26 (×3): 20 meq via ORAL
  Filled 2020-01-25 (×3): qty 1

## 2020-01-25 NOTE — Progress Notes (Signed)
1 Day Post-Op  Subjective: Mild incisional pain, well controlled with IV Dilaudid.  Objective: Vital signs in last 24 hours: Temp:  [97.6 F (36.4 C)-98.3 F (36.8 C)] 98.3 F (36.8 C) (03/06 0448) Pulse Rate:  [54-96] 64 (03/06 0448) Resp:  [13-20] 18 (03/06 0401) BP: (102-131)/(44-66) 112/54 (03/06 0448) SpO2:  [96 %-100 %] 100 % (03/06 0448) Last BM Date: 01/24/20  Intake/Output from previous day: 03/05 0701 - 03/06 0700 In: 2210 [P.O.:640; I.V.:1525] Out: 470 [Drains:220; Blood:250] Intake/Output this shift: No intake/output data recorded.  General appearance: alert, cooperative and no distress Resp: clear to auscultation bilaterally Breasts: Incisions healing well, no hematoma present.  JP drainage bilaterally is serosanguineous in nature. Cardio: regular rate and rhythm, S1, S2 normal, no murmur, click, rub or gallop  Lab Results:  Recent Labs    01/22/20 1347 01/25/20 0858  WBC 6.9 8.6  HGB 11.1* 8.5*  HCT 34.5* 27.4*  PLT 325 245   BMET Recent Labs    01/25/20 0858  NA 137  K 3.6  CL 105  CO2 26  GLUCOSE 135*  BUN 9  CREATININE 0.70  CALCIUM 9.2   PT/INR No results for input(s): LABPROT, INR in the last 72 hours.  Studies/Results: No results found.  Anti-infectives: Anti-infectives (From admission, onward)   Start     Dose/Rate Route Frequency Ordered Stop   01/24/20 0645  ceFAZolin (ANCEF) IVPB 2g/100 mL premix     2 g 200 mL/hr over 30 Minutes Intravenous On call to O.R. 01/24/20 [id number] 01/24/20 0745      Assessment/Plan: s/p Procedure(s): LEFT SIMPLE MASTECTOMY RIGHT MASTECTOMY MODIFIED RADICAL Impression: Stable on postoperative day 1,  JP drainage appropriate.  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  No need for transfusion.  Anticipate discharge in next 24-48 hours.  Magnesium and potassium restarted.  LOS: 1 day    Aviva Signs 01/25/2020

## 2020-01-25 NOTE — Anesthesia Postprocedure Evaluation (Signed)
Anesthesia Post Note  Patient: Monica Hancock  Procedure(s) Performed: LEFT SIMPLE MASTECTOMY (Left ) RIGHT MASTECTOMY MODIFIED RADICAL (Right )  Anesthesia Post Evaluation   Last Vitals:  Vitals:   01/25/20 0401 01/25/20 0448  BP: (!) 102/44 (!) 112/54  Pulse: 84 64  Resp: 18   Temp: 36.4 C 36.8 C  SpO2: 98% 100%    Last Pain:  Vitals:   01/25/20 0817  TempSrc:   PainSc: 6                  Sharron Simpson C Selen Smucker

## 2020-01-26 LAB — CBC
HCT: 25.5 % — ABNORMAL LOW (ref 36.0–46.0)
Hemoglobin: 7.8 g/dL — ABNORMAL LOW (ref 12.0–15.0)
MCH: 30.2 pg (ref 26.0–34.0)
MCHC: 30.6 g/dL (ref 30.0–36.0)
MCV: 98.8 fL (ref 80.0–100.0)
Platelets: 229 10*3/uL (ref 150–400)
RBC: 2.58 MIL/uL — ABNORMAL LOW (ref 3.87–5.11)
RDW: 13.8 % (ref 11.5–15.5)
WBC: 5.9 10*3/uL (ref 4.0–10.5)
nRBC: 0 % (ref 0.0–0.2)

## 2020-01-26 LAB — BASIC METABOLIC PANEL
Anion gap: 4 — ABNORMAL LOW (ref 5–15)
BUN: 10 mg/dL (ref 6–20)
CO2: 28 mmol/L (ref 22–32)
Calcium: 9.3 mg/dL (ref 8.9–10.3)
Chloride: 106 mmol/L (ref 98–111)
Creatinine, Ser: 0.56 mg/dL (ref 0.44–1.00)
GFR calc Af Amer: >60 mL/min (ref >60)
GFR calc non Af Amer: >60 mL/min (ref >60)
Glucose, Bld: 105 mg/dL — ABNORMAL HIGH (ref 70–99)
Potassium: 3.8 mmol/L (ref 3.5–5.1)
Sodium: 138 mmol/L (ref 135–145)

## 2020-01-26 LAB — MAGNESIUM: Magnesium: 1.5 mg/dL — ABNORMAL LOW (ref 1.7–2.4)

## 2020-01-26 MED ORDER — FERROUS SULFATE 325 (65 FE) MG PO TABS: 325.0000 mg | ORAL_TABLET | Freq: Three times a day (TID) | ORAL | 1 refills | Status: DC

## 2020-01-26 MED ORDER — OXYCODONE-ACETAMINOPHEN 7.5-325 MG PO TABS: 1.0000 | ORAL_TABLET | Freq: Four times a day (QID) | ORAL | 0 refills | Status: DC | PRN

## 2020-01-26 NOTE — Progress Notes (Signed)
Nsg Discharge Note  Admit Date:  01/24/2020 Discharge date: 01/26/2020   ETOSHA HRBEK to be D/C'd Home per MD order.  AVS completed. Patient able to verbalize understanding.  Discharge Medication: Allergies as of 01/26/2020   No Known Allergies     Medication List    TAKE these medications   cyclobenzaprine 10 MG tablet Commonly known as: FLEXERIL 1/2 po qhs   diphenhydrAMINE 50 MG tablet Commonly known as: BENADRYL Take 50 mg by mouth daily.   ferrous sulfate 325 (65 FE) MG tablet Commonly known as: FerrouSul Take 1 tablet (325 mg total) by mouth 3 (three) times daily with meals.   loratadine 10 MG tablet Commonly known as: CLARITIN Take 10 mg by mouth daily.   magnesium oxide 400 (241.3 Mg) MG tablet Commonly known as: MAG-OX Take 1 tablet (400 mg total) by mouth 3 (three) times daily.   oxyCODONE-acetaminophen 7.5-325 MG tablet Commonly known as: Percocet Take 1 tablet by mouth every 6 (six) hours as needed for severe pain.   pertuzumab in sodium chloride 0.9 % 250 mL Inject into the vein every 21 ( twenty-one) days.   potassium chloride 10 MEQ tablet Commonly known as: KLOR-CON Take 2 tablets (20 mEq total) by mouth 2 (two) times daily.   prochlorperazine 10 MG tablet Commonly known as: COMPAZINE Take 1 tablet (10 mg total) by mouth every 6 (six) hours as needed for nausea or vomiting.   trastuzumab-dkst 2 mg/kg in sodium chloride 0.9 % 250 mL Inject into the vein every 21 ( twenty-one) days.       Discharge Assessment: Vitals:   01/25/20 2310 01/26/20 0427  BP:  109/60  Pulse:  60  Resp:    Temp: 98.3 F (36.8 C) 98 F (36.7 C)  SpO2:  99%   Skin clean, dry and intact without evidence of skin break down, no evidence of skin tears noted. IV catheter discontinued intact. Site without signs and symptoms of complications - no redness or edema noted at insertion site, patient denies c/o pain - only slight tenderness at site.  Dressing with slight  pressure applied.  D/c Instructions-Education: Discharge instructions given to patient with verbalized understanding. D/c education completed with patient including follow up instructions, medication list, d/c activities limitations if indicated, with other d/c instructions as indicated by MD - patient able to verbalize understanding, all questions fully answered. Patient instructed to return to ED, call 911, or call MD for any changes in condition.  Patient escorted via [place], and D/C home via private auto.  Callan Yontz Loletha Grayer, RN 01/26/2020 10:22 AM

## 2020-01-26 NOTE — Discharge Summary (Signed)
Physician Discharge Summary  Patient ID: Monica Hancock MRN: [id number] DOB/AGE: 41/02/1981 41 y.o.  Admit date: 01/24/2020 Discharge date: 01/26/2020  Admission Diagnoses: Invasive ductal carcinoma of right breast, BRCA1  Discharge Diagnoses: Same Active Problems:   BRCA1-associated protein-1 tumor predisposition syndrome   Malignant neoplasm of upper-outer quadrant of right female breast ([place])   S/P bilateral mastectomy Acute on chronic anemia  Discharged Condition: good  Hospital Course: Patient is a 39 year old Hispanic female recently diagnosed with invasive ductal carcinoma of the right breast who has undergone neoadjuvant chemotherapy who presented for a right modified radical mastectomy as well as a left simple mastectomy on 01/24/2020.  She is BRCA1 positive.  Her postoperative course was remarkable for acute on chronic anemia secondary to surgical blood loss.  She did not require blood transfusion.  Her pain is well controlled with p.o. pain medications.  She is being discharged home on 01/26/2020 in good and improving condition.  Treatments: surgery: Right modified radical mastectomy, left simple mastectomy on 01/24/2020  Discharge Exam: Blood pressure 109/60, pulse 60, temperature 98 F (36.7 C), temperature source Oral, resp. rate 18, height 4' 8"  (1.422 m), weight 66 kg, SpO2 99 %. General appearance: alert, cooperative and no distress Resp: clear to auscultation bilaterally Breasts: Bilateral incisions healing well.  No hematomas present.  JP drainage serosanguineous in nature bilaterally.  Both drains were stripped. Cardio: regular rate and rhythm, S1, S2 normal, no murmur, click, rub or gallop  Disposition: Discharge disposition: 01-Home or Self Care       Discharge Instructions    Diet - low sodium heart healthy   Complete by: As directed    Increase activity slowly   Complete by: As directed      Allergies as of 01/26/2020   No Known Allergies     Medication  List    TAKE these medications   cyclobenzaprine 10 MG tablet Commonly known as: FLEXERIL 1/2 po qhs   diphenhydrAMINE 50 MG tablet Commonly known as: BENADRYL Take 50 mg by mouth daily.   ferrous sulfate 325 (65 FE) MG tablet Commonly known as: FerrouSul Take 1 tablet (325 mg total) by mouth 3 (three) times daily with meals.   loratadine 10 MG tablet Commonly known as: CLARITIN Take 10 mg by mouth daily.   magnesium oxide 400 (241.3 Mg) MG tablet Commonly known as: MAG-OX Take 1 tablet (400 mg total) by mouth 3 (three) times daily.   oxyCODONE-acetaminophen 7.5-325 MG tablet Commonly known as: Percocet Take 1 tablet by mouth every 6 (six) hours as needed for severe pain.   pertuzumab in sodium chloride 0.9 % 250 mL Inject into the vein every 21 ( twenty-one) days.   potassium chloride 10 MEQ tablet Commonly known as: KLOR-CON Take 2 tablets (20 mEq total) by mouth 2 (two) times daily.   prochlorperazine 10 MG tablet Commonly known as: COMPAZINE Take 1 tablet (10 mg total) by mouth every 6 (six) hours as needed for nausea or vomiting.   trastuzumab-dkst 2 mg/kg in sodium chloride 0.9 % 250 mL Inject into the vein every 21 ( twenty-one) days.      Follow-up Information    Aviva Signs, MD. Schedule an appointment as soon as possible for a visit on 01/30/2020.   Specialty: General Surgery Contact information: [id number] [address] [phone]           Signed: Aviva Signs 01/26/2020, 10:14 AM

## 2020-01-26 NOTE — Discharge Instructions (Signed)
Surgical [facility] Surgical drains are used to remove extra fluid that normally builds up in a surgical wound after surgery. A surgical drain helps to heal a surgical wound. Different kinds of surgical drains include:  Active drains. These drains use suction to pull drainage away from the surgical wound. Drainage flows through a tube to a container outside of the body. With these drains, you need to keep the bulb or the drainage container flat (compressed) at all times, except while you empty it. Flattening the bulb or container creates suction.  Passive drains. These drains allow fluid to drain naturally, by gravity. Drainage flows through a tube to a bandage (dressing) or a container outside of the body. Passive drains do not need to be emptied. A drain is placed during surgery. Right after surgery, drainage is usually bright red and a little thicker than water. The drainage may gradually turn yellow or pink and become thinner. It is likely that your health care provider will remove the drain when the drainage stops or when the amount decreases to 1-2 Tbsp (15-30 mL) during a 24-hour period. Supplies needed:  Tape.  Germ-free cleaning solution (sterile saline).  Cotton swabs.  Split gauze drain sponge: 4 x 4 inches (10 x 10 cm).  Gauze square: 4 x 4 inches (10 x 10 cm). How to care for your surgical drain Care for your drain as told by your health care provider. This is important to help prevent infection. If your drain is placed at your back, or any other hard-to-reach area, ask another person to assist you in performing the following tasks: General care  Keep the skin around the drain dry and covered with a dressing at all times.  Check your drain area every day for signs of infection. Check for: ? Redness, swelling, or pain. ? Pus or a bad smell. ? Cloudy drainage. ? Tenderness or pressure at the drain exit site. Changing the dressing Follow instructions from your health care  provider about how to change your dressing. Change your dressing at least once a day. Change it more often if needed to keep the dressing dry. Make sure you: 1. Gather your supplies. 2. Wash your hands with soap and water before you change your dressing. If soap and water are not available, use hand sanitizer. 3. Remove the old dressing. Avoid using scissors to do that. 4. Wash your hands with soap and water again after removing the old dressing. 5. Use sterile saline to clean your skin around the drain. You may need to use a cotton swab to clean the skin. 6. Place the tube through the slit in a drain sponge. Place the drain sponge so that it covers your wound. 7. Place the gauze square or another drain sponge on top of the drain sponge that is on the wound. Make sure the tube is between those layers. 8. Tape the dressing to your skin. 9. Tape the drainage tube to your skin 1-2 inches (2.5-5 cm) below the place where the tube enters your body. Taping keeps the tube from pulling on any stitches (sutures) that you have. 10. Wash your hands with soap and water. 11. Write down the color of your drainage and how often you change your dressing. How to empty your active drain  1. Make sure that you have a measuring cup that you can empty your drainage into. 2. Wash your hands with soap and water. If soap and water are not available, use hand sanitizer. 3.  Loosen any pins or clips that hold the tube in place. 4. If your health care provider tells you to strip the tube to prevent clots and tube blockages: ? Hold the tube at the skin with one hand. Use your other hand to pinch the tubing with your thumb and first finger. ? Gently move your fingers down the tube while squeezing very lightly. This clears any drainage, clots, or tissue from the tube. ? You may need to do this several times each day to keep the tube clear. Do not pull on the tube. 5. Open the bulb cap or the drain plug. Do not touch the  inside of the cap or the bottom of the plug. 6. Turn the device upside down and gently squeeze. 7. Empty all of the drainage into the measuring cup. 8. Compress the bulb or the container and replace the cap or the plug. To compress the bulb or the container, squeeze it firmly in the middle while you close the cap or plug the container. 9. Write down the amount of drainage that you have in each 24-hour period. If you have less than 2 Tbsp (30 mL) of drainage during 24 hours, contact your health care provider. 10. Flush the drainage down the toilet. 11. Wash your hands with soap and water. Contact a health care provider if:  You have redness, swelling, or pain around your drain area.  You have pus or a bad smell coming from your drain area.  You have a fever or chills.  The skin around your drain is warm to the touch.  The amount of drainage that you have is increasing instead of decreasing.  You have drainage that is cloudy.  There is a sudden stop or a sudden decrease in the amount of drainage that you have.  Your drain tube falls out.  Your active drain does not stay compressed after you empty it. Summary  Surgical drains are used to remove extra fluid that normally builds up in a surgical wound after surgery.  Different kinds of surgical drains include active drains and passive drains. Active drains use suction to pull drainage away from the surgical wound, and passive drains allow fluid to drain naturally.  It is important to care for your drain to prevent infection. If your drain is placed at your back, or any other hard-to-reach area, ask another person to assist you.  Contact your health care provider if you have redness, swelling, or pain around your drain area. This information is not intended to replace advice given to you by your health care provider. Make sure you discuss any questions you have with your health care provider. Document Revised: 12/12/2018 Document  Reviewed: 12/12/2018 Elsevier Patient Education  2020 [employer]  

## 2020-01-28 LAB — TYPE AND SCREEN
ABO/RH(D): O NEG
Antibody Screen: NEGATIVE
Unit division: 0
Unit division: 0

## 2020-01-28 LAB — BPAM RBC
Blood Product Expiration Date: 202103292359
Blood Product Expiration Date: 202104012359
Unit Type and Rh: 9500
Unit Type and Rh: 9500

## 2020-01-29 LAB — SURGICAL PATHOLOGY

## 2020-01-30 ENCOUNTER — Other Ambulatory Visit: Payer: Self-pay

## 2020-01-30 ENCOUNTER — Encounter: Payer: Self-pay | Admitting: General Surgery

## 2020-01-30 ENCOUNTER — Ambulatory Visit (INDEPENDENT_AMBULATORY_CARE_PROVIDER_SITE_OTHER): Payer: Self-pay | Admitting: General Surgery

## 2020-01-30 VITALS — BP 130/83 | HR 80 | Temp 97.6°F | Resp 12 | Ht <= 58 in | Wt 144.0 lb

## 2020-01-30 DIAGNOSIS — Z09 Encounter for follow-up examination after completed treatment for conditions other than malignant neoplasm: Secondary | ICD-10-CM

## 2020-01-30 NOTE — Progress Notes (Signed)
Subjective:     Monica Hancock  Patient is here for postoperative visit, status post right modified radical mastectomy and left simple mastectomy.  She states she is doing well.  She is still having bloody drainage from both drains.  Her pain is well controlled. Objective:    BP 130/83   Pulse 80   Temp 97.6 F (36.4 C) (Oral)   Resp 12   Ht 4\' 8"  (1.422 m)   Wt 144 lb (65.3 kg)   SpO2 98%   BMI 32.28 kg/m   General:  alert, cooperative and no distress  Bilateral breast examination reveals incisions that are healing well without drainage.  No hematomas are present.  She does have ecchymosis along the inferior aspect of the right mastectomy.  No arm swelling present. Final pathology revealed no residual tumor in the right breast.  6 lymph nodes were removed, without evidence of metastatic disease.  No cancer seen in the left breast.  Patient is aware.     Assessment:    Doing well postoperatively.    Plan:   We will see patient again next week for follow-up.  Anticipate removing drains and staples at that time.

## 2020-02-04 ENCOUNTER — Other Ambulatory Visit: Payer: Self-pay

## 2020-02-04 ENCOUNTER — Encounter: Payer: Self-pay | Admitting: General Surgery

## 2020-02-04 ENCOUNTER — Ambulatory Visit (INDEPENDENT_AMBULATORY_CARE_PROVIDER_SITE_OTHER): Payer: Self-pay | Admitting: General Surgery

## 2020-02-04 VITALS — BP 113/82 | HR 82 | Temp 98.8°F | Resp 12 | Ht <= 58 in | Wt 141.0 lb

## 2020-02-04 DIAGNOSIS — Z09 Encounter for follow-up examination after completed treatment for conditions other than malignant neoplasm: Secondary | ICD-10-CM

## 2020-02-05 NOTE — Progress Notes (Addendum)
Subjective:     Monica Hancock  Here for postoperative visit, status post bilateral mastectomies.  Patient is doing well.  She is aware of the final pathology results.  Denies any drainage from the wounds.  Both JP drains have minimal serosanguineous drainage present.  Still with some mild incisional pain, but she is increasing her range of motion in both arms.  No significant arm pain or swelling noted. Objective:    BP 113/82   Pulse 82   Temp 98.8 F (37.1 C) (Oral)   Resp 12   Ht 4\' 8"  (1.422 m)   Wt 141 lb (64 kg)   SpO2 99%   BMI 31.61 kg/m   General:  alert, cooperative and no distress  Both mastectomy incisions are healing well.  Both drains were removed.  Staples removed, Steri-Strips applied.     Assessment:    Doing well postoperatively.    Plan:   Patient to follow-up in several weeks for wound check as needed.  Patient already has an appointment to follow-up with Dr. Delton Coombes next week to continue chemotherapy.  Patient given prescription for bilateral breast prostheses.

## 2020-02-13 ENCOUNTER — Other Ambulatory Visit: Payer: Self-pay

## 2020-02-13 ENCOUNTER — Inpatient Hospital Stay (HOSPITAL_COMMUNITY): Payer: Medicaid Other | Attending: Hematology

## 2020-02-13 ENCOUNTER — Inpatient Hospital Stay (HOSPITAL_BASED_OUTPATIENT_CLINIC_OR_DEPARTMENT_OTHER): Payer: Medicaid Other | Admitting: Hematology

## 2020-02-13 ENCOUNTER — Inpatient Hospital Stay (HOSPITAL_COMMUNITY): Payer: Medicaid Other

## 2020-02-13 ENCOUNTER — Encounter (HOSPITAL_COMMUNITY): Payer: Self-pay | Admitting: Hematology

## 2020-02-13 VITALS — BP 113/67 | HR 71 | Temp 97.7°F | Resp 18

## 2020-02-13 DIAGNOSIS — Z79899 Other long term (current) drug therapy: Secondary | ICD-10-CM | POA: Diagnosis not present

## 2020-02-13 DIAGNOSIS — M79602 Pain in left arm: Secondary | ICD-10-CM | POA: Diagnosis not present

## 2020-02-13 DIAGNOSIS — C50411 Malignant neoplasm of upper-outer quadrant of right female breast: Secondary | ICD-10-CM | POA: Insufficient documentation

## 2020-02-13 DIAGNOSIS — Z170 Estrogen receptor positive status [ER+]: Secondary | ICD-10-CM

## 2020-02-13 DIAGNOSIS — R197 Diarrhea, unspecified: Secondary | ICD-10-CM | POA: Diagnosis not present

## 2020-02-13 DIAGNOSIS — Z9013 Acquired absence of bilateral breasts and nipples: Secondary | ICD-10-CM | POA: Diagnosis not present

## 2020-02-13 DIAGNOSIS — E876 Hypokalemia: Secondary | ICD-10-CM | POA: Diagnosis not present

## 2020-02-13 DIAGNOSIS — Z803 Family history of malignant neoplasm of breast: Secondary | ICD-10-CM | POA: Diagnosis not present

## 2020-02-13 DIAGNOSIS — Z5112 Encounter for antineoplastic immunotherapy: Secondary | ICD-10-CM | POA: Diagnosis not present

## 2020-02-13 LAB — COMPREHENSIVE METABOLIC PANEL
ALT: 25 U/L (ref 0–44)
AST: 20 U/L (ref 15–41)
Albumin: 3.6 g/dL (ref 3.5–5.0)
Alkaline Phosphatase: 68 U/L (ref 38–126)
Anion gap: 10 (ref 5–15)
BUN: 14 mg/dL (ref 6–20)
CO2: 26 mmol/L (ref 22–32)
Calcium: 9.9 mg/dL (ref 8.9–10.3)
Chloride: 104 mmol/L (ref 98–111)
Creatinine, Ser: 0.58 mg/dL (ref 0.44–1.00)
GFR calc Af Amer: >60 mL/min (ref >60)
GFR calc non Af Amer: >60 mL/min (ref >60)
Glucose, Bld: 110 mg/dL — ABNORMAL HIGH (ref 70–99)
Potassium: 3.9 mmol/L (ref 3.5–5.1)
Sodium: 140 mmol/L (ref 135–145)
Total Bilirubin: 0.5 mg/dL (ref 0.3–1.2)
Total Protein: 7 g/dL (ref 6.5–8.1)

## 2020-02-13 LAB — CBC WITH DIFFERENTIAL/PLATELET
Abs Immature Granulocytes: 0 10*3/uL (ref 0.00–0.07)
Basophils Absolute: 0 10*3/uL (ref 0.0–0.1)
Basophils Relative: 0 %
Eosinophils Absolute: 0.1 10*3/uL (ref 0.0–0.5)
Eosinophils Relative: 1 %
HCT: 34.7 % — ABNORMAL LOW (ref 36.0–46.0)
Hemoglobin: 10.8 g/dL — ABNORMAL LOW (ref 12.0–15.0)
Immature Granulocytes: 0 %
Lymphocytes Relative: 44 %
Lymphs Abs: 1.9 10*3/uL (ref 0.7–4.0)
MCH: 29.8 pg (ref 26.0–34.0)
MCHC: 31.1 g/dL (ref 30.0–36.0)
MCV: 95.6 fL (ref 80.0–100.0)
Monocytes Absolute: 0.3 10*3/uL (ref 0.1–1.0)
Monocytes Relative: 8 %
Neutro Abs: 2 10*3/uL (ref 1.7–7.7)
Neutrophils Relative %: 47 %
Platelets: 271 10*3/uL (ref 150–400)
RBC: 3.63 MIL/uL — ABNORMAL LOW (ref 3.87–5.11)
RDW: 14.9 % (ref 11.5–15.5)
WBC: 4.2 10*3/uL (ref 4.0–10.5)
nRBC: 0 % (ref 0.0–0.2)

## 2020-02-13 LAB — MAGNESIUM: Magnesium: 1.6 mg/dL — ABNORMAL LOW (ref 1.7–2.4)

## 2020-02-13 MED ORDER — SODIUM CHLORIDE 0.9 % IV SOLN
420.0000 mg | Freq: Once | INTRAVENOUS | Status: AC
  Administered 2020-02-13: 420 mg via INTRAVENOUS
  Filled 2020-02-13: qty 14

## 2020-02-13 MED ORDER — TRASTUZUMAB-DKST CHEMO 150 MG IV SOLR
6.0000 mg/kg | Freq: Once | INTRAVENOUS | Status: AC
  Administered 2020-02-13: 420 mg via INTRAVENOUS
  Filled 2020-02-13: qty 20

## 2020-02-13 MED ORDER — ACETAMINOPHEN 325 MG PO TABS
650.0000 mg | ORAL_TABLET | Freq: Once | ORAL | Status: AC
  Administered 2020-02-13: 650 mg via ORAL
  Filled 2020-02-13: qty 2

## 2020-02-13 MED ORDER — HEPARIN SOD (PORK) LOCK FLUSH 100 UNIT/ML IV SOLN
500.0000 [IU] | Freq: Once | INTRAVENOUS | Status: AC | PRN
  Administered 2020-02-13: 500 [IU]

## 2020-02-13 MED ORDER — SODIUM CHLORIDE 0.9 % IV SOLN: Freq: Once | INTRAVENOUS | Status: AC

## 2020-02-13 MED ORDER — DIPHENHYDRAMINE HCL 25 MG PO CAPS
25.0000 mg | ORAL_CAPSULE | Freq: Once | ORAL | Status: AC
  Administered 2020-02-13: 25 mg via ORAL
  Filled 2020-02-13: qty 1

## 2020-02-13 MED ORDER — SODIUM CHLORIDE 0.9% FLUSH
10.0000 mL | INTRAVENOUS | Status: DC | PRN
  Administered 2020-02-13: 10 mL

## 2020-02-13 NOTE — Patient Instructions (Signed)
[facility] at [facility] Discharge Instructions  Labs drawn from portacath today   Thank you for choosing [facility] at [facility] to provide your oncology and hematology care.  To afford each patient quality time with our provider, please arrive at least 15 minutes before your scheduled appointment time.   If you have a lab appointment with the Cancer Center please come in thru the Main Entrance and check in at the main information desk.  You need to re-schedule your appointment should you arrive 10 or more minutes late.  We strive to give you quality time with our providers, and arriving late affects you and other patients whose appointments are after yours.  Also, if you no show three or more times for appointments you may be dismissed from the clinic at the providers discretion.     Again, thank you for choosing [facility].  Our hope is that these requests will decrease the amount of time that you wait before being seen by our physicians.       _____________________________________________________________  Should you have questions after your visit to [facility], please contact our office at [phone] between the hours of 8:00 a.m. and 4:30 p.m.  Voicemails left after 4:00 p.m. will not be returned until the following business day.  For prescription refill requests, have your pharmacy contact our office and allow 72 hours.    Due to Covid, you will need to wear a mask upon entering the hospital. If you do not have a mask, a mask will be given to you at the Main Entrance upon arrival. For doctor visits, patients may have 1 support person with them. For treatment visits, patients can not have anyone with them due to social distancing guidelines and our immunocompromised population.     

## 2020-02-13 NOTE — Assessment & Plan Note (Signed)
1.  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 -Herceptin and Pertuzumab maintenance started on 01/02/2020. -Bilateral mastectomies on 01/24/2020.  Pathology showed YPT0Y PN 0, 0/6 lymph nodes positive. -I have discussed the pathology report with the patient in detail.  She has achieved a complete pathological response indicating a very good prognosis. -Hands she will continue on the current regimen of Herceptin and Pertuzumab. -I have reviewed her labs.  White count and platelet count is normal.  LFTs are normal. -I talked to her about initiating her on antiestrogen therapy.  However she is planning to have oophorectomy.  I will start her on anastrozole after the surgery.  I have recommended consultation with radiation oncology.  I plan to see her back in 6 weeks for follow-up.  2.  BRCA1 positive to: -She she underwent bilateral mastectomies. -We will contact [name] to schedule bilateral oophorectomy.  3.  Hypokalemia: -She will continue potassium 20 mEq twice daily.  Potassium today is 3.9.  4.  Hypomagnesemia: -Magnesium today is 1.6.  She will continue magnesium 3 times a day.  I will not increase the dose as she is having diarrhea.  5.  High risk drug monitoring: -2D echo on 12/30/2019 shows EF 60-65%.  We will plan to repeat in 3 months.  6.  Diarrhea: -She is having intermittent diarrhea and is currently taking Imodium 2 tablets/day.

## 2020-02-13 NOTE — Progress Notes (Signed)
1105- Lab results reviewed and patient seen by Dr. Delton Coombes who approved patient for treatment today.   MICHIYE LISBOA tolerated Ogivri and Perjeta infusions without incident or complaint. VSS upon completion of treatment. Port flushed and deaccessed. Site clean and dry. Discharged self ambulatory in satisfactory condition with follow up instructions.

## 2020-02-13 NOTE — Patient Instructions (Signed)
[place] [facility] at [facility] Discharge Instructions  You were seen today by Dr. Delton Coombes. He went over your recent lab results and how you've been since your surgery. He will refer to [facility] for radiation consult. Continue treatment every 3 weeks. He will see you back in 6 weeks for labs, treatment and follow up.   Thank you for choosing [place] at [facility] to provide your oncology and hematology care.  To afford each patient quality time with our provider, please arrive at least 15 minutes before your scheduled appointment time.   If you have a lab appointment with the [facility] please come in thru the  Main Entrance and check in at the main information desk  You need to re-schedule your appointment should you arrive 10 or more minutes late.  We strive to give you quality time with our providers, and arriving late affects you and other patients whose appointments are after yours.  Also, if you no show three or more times for appointments you may be dismissed from the clinic at the providers discretion.     Again, thank you for choosing [facility].  Our hope is that these requests will decrease the amount of time that you wait before being seen by our physicians.       _____________________________________________________________  Should you have questions after your visit to [facility], please contact our office at [phone] between the hours of 8:00 a.m. and 4:30 p.m.  Voicemails left after 4:00 p.m. will not be returned until the following business day.  For prescription refill requests, have your pharmacy contact our office and allow 72 hours.    Cancer Center Support Programs:   > Cancer Support Group  2nd Tuesday of the month 1pm-2pm, Journey Room

## 2020-02-13 NOTE — Progress Notes (Signed)
Patient has been assessed, vital signs and labs have been reviewed by Dr. [name]. ANC, Creatinine, LFTs, and Platelets are within treatment parameters per Dr. [name]. The patient is good to proceed with treatment at this time.  

## 2020-02-13 NOTE — Progress Notes (Signed)
[place] [place], [place] [address]   CLINIC:  Medical Oncology/Hematology  PCP:  [name], MD [place] [id number] [phone]   REASON FOR VISIT:  HER-2 positive right breast cancer  CURRENT THERAPY: Maintenance Herceptin and Pertuzumab.    INTERVAL HISTORY:  Ms. Chowning 39 y.o. female seen for follow-up of right breast cancer.  She underwent bilateral mastectomies.  Wounds are healing very well.  Appetite and energy levels are 100%.  Reports having intermittent diarrhea.  On average requiring Imodium 2 tablets/day.  Some days she does not require any.  She was started on iron tablets by Dr. Arnoldo Morale.  REVIEW OF SYSTEMS:  Review of Systems  All other systems reviewed and are negative.    PAST MEDICAL/SURGICAL HISTORY:  Past Medical History:  Diagnosis Date  . BRCA1 positive   . Cancer [facility])    Right Breast  . Family history of adverse reaction to anesthesia    PONV-children  . Family history of BRCA1 gene positive   . Family history of breast cancer   . H/O Bell's palsy 2000   Left sided  . HSV infection    Past Surgical History:  Procedure Laterality Date  . BREAST BIOPSY Right   . IR IMAGING GUIDED PORT INSERTION  08/26/2019  . MASTECTOMY MODIFIED RADICAL Right 01/24/2020   Procedure: RIGHT MASTECTOMY MODIFIED RADICAL;  Surgeon: Aviva Signs, MD;  Location: [facility];  Service: General;  Laterality: Right;  . SIMPLE MASTECTOMY WITH AXILLARY SENTINEL NODE BIOPSY Left 01/24/2020   Procedure: LEFT SIMPLE MASTECTOMY;  Surgeon: Aviva Signs, MD;  Location: [facility];  Service: General;  Laterality: Left;  . TUBAL LIGATION       SOCIAL HISTORY:  Social History   Socioeconomic History  . Marital status: Married    Spouse name: [name]  . Number of children: 5  . Years of education: Not on file  . Highest education level: 12th grade  Occupational History  . Not on file  Tobacco Use  . Smoking status: Never Smoker    . Smokeless tobacco: Never Used  Substance and Sexual Activity  . Alcohol use: No  . Drug use: No  . Sexual activity: Yes    Birth control/protection: Surgical    Comment: tubal  Other Topics Concern  . Not on file  Social History Narrative  . Not on file   Social Determinants of Health   Financial Resource Strain: Medium Risk  . Difficulty of Paying Living Expenses: Somewhat hard  Food Insecurity: Food Insecurity Present  . Worried About Charity fundraiser in the Last Year: Sometimes true  . Ran Out of Food in the Last Year: Sometimes true  Transportation Needs: No Transportation Needs  . Lack of Transportation (Medical): No  . Lack of Transportation (Non-Medical): No  Physical Activity: Inactive  . Days of Exercise per Week: 0 days  . Minutes of Exercise per Session: 0 min  Stress: No Stress Concern Present  . Feeling of Stress : Only a little  Social Connections: Not Isolated  . Frequency of Communication with Friends and Family: More than three times a week  . Frequency of Social Gatherings with Friends and Family: More than three times a week  . Attends Religious Services: More than 4 times per year  . Active Member of Clubs or Organizations: Yes  . Attends Archivist Meetings: More than 4 times per year  . Marital Status: Married  Intimate Partner Violence: Not At Risk  . Fear of Current or Ex-Partner: No  . Emotionally Abused: No  . Physically Abused: No  . Sexually Abused: No    FAMILY HISTORY:  Family History  Problem Relation Age of Onset  . Diabetes Mother   . Breast cancer Mother 83  . Cancer Mother   . Diabetes Maternal Grandmother   . Stomach cancer Maternal Grandfather   . Breast cancer Maternal Aunt        dx in her 19s  . Diabetes Maternal Aunt     CURRENT MEDICATIONS:  Outpatient Encounter Medications as of 02/13/2020  Medication Sig Note  . cyclobenzaprine (FLEXERIL) 10 MG tablet 1/2 po qhs   . diphenhydrAMINE (BENADRYL) 50 MG  tablet Take 50 mg by mouth daily.   . ferrous sulfate (FERROUSUL) 325 (65 FE) MG tablet Take 1 tablet (325 mg total) by mouth 3 (three) times daily with meals.   Marland Kitchen loratadine (CLARITIN) 10 MG tablet Take 10 mg by mouth daily. 01/23/2020: Last dose 12/10/19  . magnesium oxide (MAG-OX) 400 (241.3 Mg) MG tablet Take 1 tablet (400 mg total) by mouth 3 (three) times daily.   Marland Kitchen oxyCODONE-acetaminophen (PERCOCET) 7.5-325 MG tablet Take 1 tablet by mouth every 6 (six) hours as needed for severe pain.   . pertuzumab in sodium chloride 0.9 % 250 mL Inject into the vein every 21 ( twenty-one) days. 01/23/2020: Last dose 12/10/19  . potassium chloride SA (KLOR-CON) 10 MEQ tablet Take 2 tablets (20 mEq total) by mouth 2 (two) times daily.   . prochlorperazine (COMPAZINE) 10 MG tablet Take 1 tablet (10 mg total) by mouth every 6 (six) hours as needed for nausea or vomiting.   . trastuzumab-dkst 2 mg/kg in sodium chloride 0.9 % 250 mL Inject into the vein every 21 ( twenty-one) days. 01/23/2020: Last dose 12/10/19   No facility-administered encounter medications on file as of 02/13/2020.    ALLERGIES:  No Known Allergies   PHYSICAL EXAM:  ECOG Performance status: 0  Vitals:   02/13/20 0937 02/13/20 0940  BP: 106/70 106/70  Pulse: 79 79  Resp: 18 18  Temp: (!) 97.3 F (36.3 C) (!) 97.3 F (36.3 C)  SpO2: 100% 100%   Filed Weights   02/13/20 0937 02/13/20 0940  Weight: 143 lb 12.8 oz (65.2 kg) 143 lb 12.8 oz (65.2 kg)    Physical Exam Vitals reviewed.  Constitutional:      Appearance: Normal appearance.  Cardiovascular:     Rate and Rhythm: Normal rate and regular rhythm.     Heart sounds: Normal heart sounds.  Pulmonary:     Effort: Pulmonary effort is normal.     Breath sounds: Normal breath sounds.  Abdominal:     General: There is no distension.     Palpations: Abdomen is soft. There is no mass.  Musculoskeletal:        General: No swelling.  Lymphadenopathy:     Cervical: No cervical  adenopathy.  Skin:    General: Skin is warm.  Neurological:     General: No focal deficit present.     Mental Status: She is alert and oriented to person, place, and time.  Psychiatric:        Mood and Affect: Mood normal.        Behavior: Behavior normal.   Bilateral mastectomy sites are well-healed. LABORATORY DATA:  I have reviewed the labs as listed.  CBC    Component Value Date/Time  WBC 4.2 02/13/2020 0951   RBC 3.63 (L) 02/13/2020 0951   HGB 10.8 (L) 02/13/2020 0951   HCT 34.7 (L) 02/13/2020 0951   PLT 271 02/13/2020 0951   MCV 95.6 02/13/2020 0951   MCH 29.8 02/13/2020 0951   MCHC 31.1 02/13/2020 0951   RDW 14.9 02/13/2020 0951   LYMPHSABS 1.9 02/13/2020 0951   MONOABS 0.3 02/13/2020 0951   EOSABS 0.1 02/13/2020 0951   BASOSABS 0.0 02/13/2020 0951   CMP Latest Ref Rng & Units 02/13/2020 01/26/2020 01/25/2020  Glucose 70 - 99 mg/dL 110(H) 105(H) 135(H)  BUN 6 - 20 mg/dL 14 10 9   Creatinine 0.44 - 1.00 mg/dL 0.58 0.56 0.70  Sodium 135 - 145 mmol/L 140 138 137  Potassium 3.5 - 5.1 mmol/L 3.9 3.8 3.6  Chloride 98 - 111 mmol/L 104 106 105  CO2 22 - 32 mmol/L 26 28 26   Calcium 8.9 - 10.3 mg/dL 9.9 9.3 9.2  Total Protein 6.5 - 8.1 g/dL 7.0 - -  Total Bilirubin 0.3 - 1.2 mg/dL 0.5 - -  Alkaline Phos 38 - 126 U/L 68 - -  AST 15 - 41 U/L 20 - -  ALT 0 - 44 U/L 25 - -       DIAGNOSTIC IMAGING:  I have independently reviewed scans.    ASSESSMENT & PLAN:   Malignant neoplasm of upper-outer quadrant of right female breast ([place]) 1.  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 -Herceptin and Pertuzumab maintenance started on 01/02/2020. -Bilateral mastectomies on 01/24/2020.  Pathology showed YPT0Y PN 0, 0/6 lymph nodes positive. -I have discussed the pathology report with the patient in detail.  She has achieved a complete pathological response indicating a very good  prognosis. -Hands she will continue on the current regimen of Herceptin and Pertuzumab. -I have reviewed her labs.  White count and platelet count is normal.  LFTs are normal. -I talked to her about initiating her on antiestrogen therapy.  However she is planning to have oophorectomy.  I will start her on anastrozole after the surgery.  I have recommended consultation with radiation oncology.  I plan to see her back in 6 weeks for follow-up.  2.  BRCA1 positive to: -She she underwent bilateral mastectomies. -We will contact [name] to schedule bilateral oophorectomy.  3.  Hypokalemia: -She will continue potassium 20 mEq twice daily.  Potassium today is 3.9.  4.  Hypomagnesemia: -Magnesium today is 1.6.  She will continue magnesium 3 times a day.  I will not increase the dose as she is having diarrhea.  5.  High risk drug monitoring: -2D echo on 12/30/2019 shows EF 60-65%.  We will plan to repeat in 3 months.  6.  Diarrhea: -She is having intermittent diarrhea and is currently taking Imodium 2 tablets/day.   Orders placed this encounter:  No orders of the defined types were placed in this encounter.     Derek Jack, MD [place] [phone]

## 2020-02-13 NOTE — Patient Instructions (Signed)
[facility] Discharge Instructions for Patients Receiving Chemotherapy   Beginning January 23rd 2017 lab work for the [facility] will be done in the  Main lab at [facility] on 1st floor. If you have a lab appointment with the [facility] please come in thru the  Main Entrance and check in at the main information desk   Today you received the following chemotherapy agents Ogivri and Perjeta.  To help prevent nausea and vomiting after your treatment, we encourage you to take your nausea medication If you develop nausea and vomiting, or diarrhea that is not controlled by your medication, call the clinic.  The clinic phone number is [phone]. Office hours are Monday-Friday 8:30am-5:00pm.  BELOW ARE SYMPTOMS THAT SHOULD BE REPORTED IMMEDIATELY:  *FEVER GREATER THAN 101.0 F  *CHILLS WITH OR WITHOUT FEVER  NAUSEA AND VOMITING THAT IS NOT CONTROLLED WITH YOUR NAUSEA MEDICATION  *UNUSUAL SHORTNESS OF BREATH  *UNUSUAL BRUISING OR BLEEDING  TENDERNESS IN MOUTH AND THROAT WITH OR WITHOUT PRESENCE OF ULCERS  *URINARY PROBLEMS  *BOWEL PROBLEMS  UNUSUAL RASH Items with * indicate a potential emergency and should be followed up as soon as possible. If you have an emergency after office hours please contact your primary care physician or go to the nearest emergency department.  Please call the clinic during office hours if you have any questions or concerns.   You may also contact the Patient Navigator at [phone] should you have any questions or need assistance in obtaining follow up care.      Resources For Cancer Patients and their Caregivers ? American Cancer Society: Can assist with transportation, wigs, general needs, runs Look Good Feel Better.        [phone] ? Cancer Care: Provides financial assistance, online support groups, medication/co-pay assistance.  1-800-813-HOPE 4176321807) ? [facility] Assists [place] cancer patients and their families through emotional , educational and financial support.  321-646-2927 ? [place] DSS Where to apply for food stamps, Medicaid and utility assistance. 848-478-1790 ? [facility]: Transportation to medical appointments. 276-479-6078 ? Social Security Administration: May apply for disability if have a Stage IV cancer. ([phone] 828-240-6041 ? [employer], Disability and Transit Services: Assists with nutrition, care and transit needs. 701-734-0974

## 2020-02-18 ENCOUNTER — Telehealth: Payer: Self-pay | Admitting: Obstetrics and Gynecology

## 2020-02-18 ENCOUNTER — Ambulatory Visit (INDEPENDENT_AMBULATORY_CARE_PROVIDER_SITE_OTHER): Payer: Self-pay | Admitting: General Surgery

## 2020-02-18 ENCOUNTER — Encounter: Payer: Self-pay | Admitting: General Surgery

## 2020-02-18 ENCOUNTER — Other Ambulatory Visit: Payer: Self-pay

## 2020-02-18 VITALS — BP 109/73 | HR 83 | Temp 98.3°F | Resp 12 | Ht <= 58 in | Wt 145.0 lb

## 2020-02-18 DIAGNOSIS — Z09 Encounter for follow-up examination after completed treatment for conditions other than malignant neoplasm: Secondary | ICD-10-CM

## 2020-02-18 NOTE — Progress Notes (Signed)
Subjective:     Monica Hancock  Here for wound check.  Patient is pleased with the results.  Some numbness is noted at the right mastectomy site. Objective:    BP 109/73   Pulse 83   Temp 98.3 F (36.8 C) (Temporal)   Resp 12   Ht 4\' 8"  (1.422 m)   Wt 145 lb (65.8 kg)   SpO2 100%   BMI 32.51 kg/m   General:  alert, cooperative and no distress  Both mastectomy incision sites have healed well.  Significantly less edema and swelling is noted in the right mastectomy site.  Steri-Strips removed.  No drainage is noted.  No hematoma or seroma are present.     Assessment:    Doing well postoperatively.    Plan:   I told her that the numbness in the right mastectomy site would slowly resolve with time, but may be not completely.  She understands.  Follow-up as needed.

## 2020-02-18 NOTE — Telephone Encounter (Addendum)
Spoke to [name] by phone today. She has just had her final postop visit from Dr Arnoldo Morale for her mastectomy. She wishes to proceed with bilateral salpingoophorectomy as soon as possible. She is currently undergoing Chemotherapy every 3 weeks til October.  She understands from Dr Jamey Reas that surgery could be done prior to completion of Chemotherapy. She desires to proceed with Bilateral Salpingoophorectomy only as that is lesser recovery than hysterectomy.  GYNECOLOGIC SONOGRAM  01/14/2020   Monica Hancock is a 39 y.o. [id number] LMP 11/05/2019,she is here for a pelvic sonogram for HX of breast CA,positive BRACA1 GENE.  Uterus                      6.3 x 3 x 4.6 cm, Total uterine volume 46 cc, homogeneous anteverted uterus,wnl  Endometrium          3.2 mm, symmetrical, WNL  Right ovary             2.4 x 2.6 x 1.3 cm, WNL  Left ovary                2.5 x 2 x 2.2 cm, WNL  No free fluid   Technician Comments:  PELVIC US TA/TV: homogeneous anteverted uterus,wnl,mult simple small nabothian cysts,EEC 3.2 mm,normal ovaries,ovaries appear mobile,no free fluid,no pain during ultrasound     [employer] 01/14/2020 10:08 AM  Spoke to [name] by phone today. She has just had her final postop visit from Dr Arnoldo Morale for her mastectomy. She wishes to proceed with bilateral salpingoophorectomy as soon as possible. She is currently undergoing Chemotherapy every 3 weeks til October.  She understands from Dr Jamey Reas that surgery could be done prior to completion of Chemotherapy. She desires to proceed with Bilateral Salpingoophorectomy only as that is lesser recovery than hysterectomy.   I will discuss with Dr Jamey Reas and confirm his opinions, then schedule surgery.

## 2020-03-02 NOTE — Progress Notes (Signed)
Pharmacist Chemotherapy Monitoring - Follow Up Assessment    I verify that I have reviewed each item in the below checklist:  . Regimen for the patient is scheduled for the appropriate day and plan matches scheduled date. Marland Kitchen Appropriate non-routine labs are ordered dependent on drug ordered. . If applicable, additional medications reviewed and ordered per protocol based on lifetime cumulative doses and/or treatment regimen.   Plan for follow-up and/or issues identified: Yes . I-vent associated with next due treatment: Yes . MD and/or nursing notified: Velta Addison  [place] Levo [facility] 03/02/2020 12:34 PM

## 2020-03-03 ENCOUNTER — Telehealth: Payer: Self-pay | Admitting: Obstetrics and Gynecology

## 2020-03-03 NOTE — Progress Notes (Signed)
Location of Breast Cancer: Malignant neoplasm of upper-outer quadrant of right female breast [facility])  Histology per Pathology Report: 08/02/19: Diagnosis Breast, right, needle core biopsy, upper outer at 10 o'clock, 5cmfn - INVASIVE MAMMARY CARCINOMA  01/24/20: SURGICAL PATHOLOGY  CASE: [id number]  PATIENT: Monica Hancock  Surgical Pathology Report   Clinical History: right breast cancer, breast cancer gene 1 positive    FINAL MICROSCOPIC DIAGNOSIS:   A. BREAST, RIGHT, MASTECTOMY, WITH AXILLARY CONTENTS:  - Intraductal papilloma with focal atypia  - Usual ductal hyperplasia and fibrocystic changes  - Duct ectasia  - Calcifications  - No carcinoma identified in six lymph nodes (0/6)  - Previous biopsy site changes with fibrosis  - No residual carcinoma identified status post neoadjuvant therapy  - See comment   B. BREAST, LEFT, SIMPLE MASTECTOMY:  - Duct ectasia and periductular chronic inflammation  - Intraductal papilloma  - No carcinoma identified   COMMENT:   A. Cytokeratin 5/6 supports the diagnosis of focal atypia within a  intraductal papilloma. There is no evidence of residual carcinoma  status post neoadjuvant therapy (ypT0, ypN0).   Receptor Status: ER(70%), PR (0%), Her2-neu (POSITIVE), Ki-(80%)  Did patient present with symptoms (if so, please note symptoms) or was this found on screening mammography?: 07/08/19: Pt called c/o R breast lump that appeared yesterday, 07-08-19. Pt states she is past due for her mammogram and also mentions that her mother has hx of breast cancer.  Pt's last mammo was a R dx mammo and R breast US on 01-31-17 which recommended screening mammo in one year (01/2018).  LPN explained to patient that someone will call her for an appointment for a dx mammo at [facility]. Pt verbalized understanding  Past/Anticipated interventions by surgeon, if any: 01/24/20: Procedure: Right modified radical mastectomy, left simple mastectomy  Surgeon:  Aviva Signs, MD  Past/Anticipated interventions by medical oncology, if any: Chemotherapy Per Dr. Delton Coombes 02/13/20: 1.  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 -Herceptin and Pertuzumab maintenance started on 01/02/2020. -Bilateral mastectomies on 01/24/2020.  Pathology showed YPT0Y PN 0, 0/6 lymph nodes positive. -I have discussed the pathology report with the patient in detail.  She has achieved a complete pathological response indicating a very good prognosis. -Hands she will continue on the current regimen of Herceptin and Pertuzumab. -I have reviewed her labs.  White count and platelet count is normal.  LFTs are normal. -I talked to her about initiating her on antiestrogen therapy.  However she is planning to have oophorectomy.  I will start her on anastrozole after the surgery.  I have recommended consultation with radiation oncology.  I plan to see her back in 6 weeks for follow-up.  2.  BRCA1 positive to: -She she underwent bilateral mastectomies. -We will contact [name] to schedule bilateral oophorectomy.  3.  Hypokalemia: -She will continue potassium 20 mEq twice daily.  Potassium today is 3.9.  4.  Hypomagnesemia: -Magnesium today is 1.6.  She will continue magnesium 3 times a day.  I will not increase the dose as she is having diarrhea.  5.  High risk drug monitoring: -2D echo on 12/30/2019 shows EF 60-65%.  We will plan to repeat in 3 months.  6.  Diarrhea: -She is having intermittent diarrhea and is currently taking Imodium 2 tablets/day.  Lymphedema issues, if any:  Pt denies s/s lymphedema. Pt has good ROM RUE.  Pain issues, if any:  Pt denies  c/o pain. Pt does report some discomfort in LEFT shoulder/upper arm. Pt reports tailbone hurts and attributes it to sitting "all day" in chair for chemotherapy infusion.   SAFETY ISSUES:  Prior radiation? no  Pacemaker/ICD?  no  Possible current pregnancy?no  Is the patient on methotrexate? no  Current Complaints / other details:  Pt presents today for initial consult with Dr. Sondra Come for Radiation Oncology. Pt is unaccompanied.   BP 121/83 (BP Location: Left Arm, Patient Position: Sitting, Cuff Size: Normal)   Pulse 61   Temp 98.9 F (37.2 C)   Resp 18   Ht 4' 8"  (1.422 m)   Wt 149 lb 3.2 oz (67.7 kg)   SpO2 100%   BMI 33.45 kg/m   Wt Readings from Last 3 Encounters:  03/04/20 149 lb 3.2 oz (67.7 kg)  02/18/20 145 lb (65.8 kg)  02/13/20 143 lb 12.8 oz (65.2 kg)       Loma Sousa, RN 03/04/2020,1:58 PM

## 2020-03-03 NOTE — Telephone Encounter (Signed)
I spoke with Dr. Denman George regarding Monica Hancock's case.  She is on neoadjuvant therapy.  The patient seems urgent the interested in getting the ovaries removed.  Ultrasound we did shows the ovaries are tiny 2.4 cm and show no evidence of cystic areas, no suspicion for abnormality.  Uterus is tiny 46 g, with a thin 2.5 mm endometrial stripe, very reassuring  Dr. Serita Grit recommendations was that she complete her chemotherapy and proceed with removal of ovaries and likely removal of the uterus, such as a supracervical hysterectomy and bilateral salpingo-oophorectomy approximately 4 to 6 weeks after completion of chemotherapy I have informed Monica Hancock of this recommendation. We will speak again next week

## 2020-03-04 ENCOUNTER — Encounter: Payer: Self-pay | Admitting: Radiation Oncology

## 2020-03-04 ENCOUNTER — Other Ambulatory Visit: Payer: Self-pay

## 2020-03-04 ENCOUNTER — Ambulatory Visit
Admission: RE | Admit: 2020-03-04 | Discharge: 2020-03-04 | Disposition: A | Discharge status: Home / Self Care | Payer: Medicaid Other | Source: Ambulatory Visit | Attending: Radiation Oncology | Admitting: Radiation Oncology

## 2020-03-04 VITALS — BP 121/83 | HR 61 | Temp 98.9°F | Resp 18 | Ht <= 58 in | Wt 149.2 lb

## 2020-03-04 DIAGNOSIS — Z1501 Genetic susceptibility to malignant neoplasm of breast: Secondary | ICD-10-CM | POA: Diagnosis not present

## 2020-03-04 DIAGNOSIS — Z803 Family history of malignant neoplasm of breast: Secondary | ICD-10-CM | POA: Insufficient documentation

## 2020-03-04 DIAGNOSIS — Z170 Estrogen receptor positive status [ER+]: Secondary | ICD-10-CM | POA: Insufficient documentation

## 2020-03-04 DIAGNOSIS — Z9013 Acquired absence of bilateral breasts and nipples: Secondary | ICD-10-CM | POA: Insufficient documentation

## 2020-03-04 DIAGNOSIS — C50411 Malignant neoplasm of upper-outer quadrant of right female breast: Secondary | ICD-10-CM | POA: Diagnosis present

## 2020-03-04 DIAGNOSIS — Z79899 Other long term (current) drug therapy: Secondary | ICD-10-CM | POA: Insufficient documentation

## 2020-03-04 NOTE — Patient Instructions (Signed)
Coronavirus (COVID-19) Are you at risk?  Are you at risk for the Coronavirus (COVID-19)?  To be considered HIGH RISK for Coronavirus (COVID-19), you have to meet the following criteria:  . Traveled to [place], [place], [place], [place] or [place]; or in the [place] to [place], [place], [place], or [place]; and have fever, cough, and shortness of breath within the last 2 weeks of travel OR . Been in close contact with a person diagnosed with COVID-19 within the last 2 weeks and have fever, cough, and shortness of breath . IF YOU DO NOT MEET THESE CRITERIA, YOU ARE CONSIDERED LOW RISK FOR COVID-19.  What to do if you are HIGH RISK for COVID-19?  . If you are having a medical emergency, call 911. . Seek medical care right away. Before you go to a doctor's office, urgent care or emergency department, call ahead and tell them about your recent travel, contact with someone diagnosed with COVID-19, and your symptoms. You should receive instructions from your physician's office regarding next steps of care.  . When you arrive at healthcare provider, tell the healthcare staff immediately you have returned from visiting [place], [place], [place], [place] or [place]; or traveled in the [place] to [place], [place], [place], or [place]; in the last two weeks or you have been in close contact with a person diagnosed with COVID-19 in the last 2 weeks.   . Tell the health care staff about your symptoms: fever, cough and shortness of breath. . After you have been seen by a medical provider, you will be either: o Tested for (COVID-19) and discharged home on quarantine except to seek medical care if symptoms worsen, and asked to  - Stay home and avoid contact with others until you get your results (4-5 days)  - Avoid travel on public transportation if possible (such as bus, train, or airplane) or o Sent to the Emergency Department by EMS for evaluation, COVID-19 testing, and possible  admission depending on your condition and test results.  What to do if you are LOW RISK for COVID-19?  Reduce your risk of any infection by using the same precautions used for avoiding the common cold or flu:  . Wash your hands often with soap and warm water for at least 20 seconds.  If soap and water are not readily available, use an alcohol-based hand sanitizer with at least 60% alcohol.  . If coughing or sneezing, cover your mouth and nose by coughing or sneezing into the elbow areas of your shirt or coat, into a tissue or into your sleeve (not your hands). . Avoid shaking hands with others and consider head nods or verbal greetings only. . Avoid touching your eyes, nose, or mouth with unwashed hands.  . Avoid close contact with people who are sick. . Avoid places or events with large numbers of people in one location, like concerts or sporting events. . Carefully consider travel plans you have or are making. . If you are planning any travel outside or inside the [place], visit the CDC's Travelers' Health webpage for the latest health notices. . If you have some symptoms but not all symptoms, continue to monitor at home and seek medical attention if your symptoms worsen. . If you are having a medical emergency, call 911.   ADDITIONAL HEALTHCARE OPTIONS FOR PATIENTS  [facility] / e-Visit: [email or web address]         [facility]: [phone]  [facility]: [phone]                   [facility]: [phone]   

## 2020-03-04 NOTE — Progress Notes (Signed)
Radiation Oncology         [phone] ________________________________  Initial Outpatient Consultation  Name: Monica Hancock MRN: [id number]  Date: 03/04/2020  DOB: January 23, 1981  NO:IBBCW, Rex Kras, MD  Derek Jack, MD   REFERRING PHYSICIAN: Derek Jack, MD  DIAGNOSIS: The encounter diagnosis was Malignant neoplasm of upper-outer quadrant of right breast in female, estrogen receptor positive ([place]).  Stage ypT0, ypN0 Right Breast UOQ, Invasive Ductal Carcinoma, ER+ / PR- / Her2+, Grade 2 (Clinical T2, N0)  HISTORY OF PRESENT ILLNESS::Monica Darnell Level Hancock is a 39 y.o. female who is accompanied by no one due to COVID-19 restrictions. She underwent bilateral diagnostic mammography and right breast ultrasonography on 07/23/2019 for palpable mass in upper outer quadrant of right breast. Results showed a 2.3 cm, highly suspicious, palpable mass at the 10 o'clock position of the right breast. No evidence of right axillary lymphadenopathy or left breast malignancy.  Biopsy on 08/02/2019 showed grade 2 invasive mammary carcinoma with E-cadherin positivity, supporting a ductal origin. Prognostic indicators significant for estrogen receptor, 70% positive with moderate staining intensity and progesterone receptor, 0% negative. Proliferation marker Ki67 at 80%. HER2 positive.  Bilateral breast MRI on 08/12/2019 showed known malignancy in the upper-outer quadrant of the right breast that measured 2.8 cm. Left breast was negative. No abnormal lymph nodes.  PET scan on 08/15/2019 showed a hypermetabolic right breast mass consistent with primary breast carcinoma. There was also some mild metabolic activity associated with a right axial lymph node that was favored to be reactive. There was also a rounded metabolic lesion posterior to the uterus that was favored to be benign physiologic activity of the right ovary. No central thoracic nodal metastasis, pulmonary metastasis, or skeletal  metastasis.  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  The patient underwent six cycles of neoadjuvant TCHP from 08/27/2019 - 12/10/2019 under the care of Dr. Delton Coombes. She was started on Herceptin and Pertuzumab maintenance on 01/02/2020.   She underwent bilateral mastectomies on 01/24/2020 performed by Dr. Arnoldo Morale.  On the right side the patient underwent a right modified radical mastectomy.  Along the left side the patient underwent a simple mastectomy.  pathology from the procedure showed intraductal papilloma with focal atypia, usual ductal hyperplasia and fibrocystic changes, duct ectasia, and calcifications of the right breast with axillary contents. No carcinoma was identified in six lymph nodes and no residual carcinoma was identified status post neoadjuvant therapy. Left breast showed duct ectasia and peri-ductular chronic inflammation and intraductal papilloma without carcinoma.  The patient was last seen by Dr. Delton Coombes on 02/13/2020, during which time she was noted to have a complete pathological response indicating a very good prognosis. They discussed initiating antiestrogen therapy. However, the patient is planning to have a bilateral salpingo-oophorectomy for cancer risk reduction. Pelvic/transvaginal ultrasound on 01/14/2020 was normal. She is anticipated to start Anastrozole after her surgery.  [name], OB-GYN, consulted with Dr. Denman George on 03/03/2020 regarding the patient's anticipated surgery. Per Dr. Johnnye Sima note, Dr. Denman George recommended that the patient proceed with removal of ovaries and likely removal of the uterus, such as a supracervical hysterectomy and bilateral salpingo-oophorectomy approximately 4-6 weeks after completion of chemotherapy.  PREVIOUS RADIATION THERAPY: No  PAST MEDICAL HISTORY:  Past Medical History:  Diagnosis Date  . BRCA1 positive   . Cancer [facility])     Right Breast  . Family history of adverse reaction  to anesthesia    PONV-children  . Family history of BRCA1 gene positive   . Family history of breast cancer   . H/O Bell's palsy 2000   Left sided  . HSV infection     PAST SURGICAL HISTORY: Past Surgical History:  Procedure Laterality Date  . BREAST BIOPSY Right   . IR IMAGING GUIDED PORT INSERTION  08/26/2019  . MASTECTOMY MODIFIED RADICAL Right 01/24/2020   Procedure: RIGHT MASTECTOMY MODIFIED RADICAL;  Surgeon: Aviva Signs, MD;  Location: [facility];  Service: General;  Laterality: Right;  . SIMPLE MASTECTOMY WITH AXILLARY SENTINEL NODE BIOPSY Left 01/24/2020   Procedure: LEFT SIMPLE MASTECTOMY;  Surgeon: Aviva Signs, MD;  Location: [facility];  Service: General;  Laterality: Left;  . TUBAL LIGATION      FAMILY HISTORY:  Family History  Problem Relation Age of Onset  . Diabetes Mother   . Breast cancer Mother 68  . Cancer Mother   . Diabetes Maternal Grandmother   . Stomach cancer Maternal Grandfather   . Breast cancer Maternal Aunt        dx in her 73s  . Diabetes Maternal Aunt     SOCIAL HISTORY:  Social History   Tobacco Use  . Smoking status: Never Smoker  . Smokeless tobacco: Never Used  Substance Use Topics  . Alcohol use: No  . Drug use: No    ALLERGIES: No Known Allergies  MEDICATIONS:  Current Outpatient Medications  Medication Sig Dispense Refill  . cyclobenzaprine (FLEXERIL) 10 MG tablet 1/2 po qhs 30 tablet 0  . diphenhydrAMINE (BENADRYL) 50 MG tablet Take 50 mg by mouth daily.    . ferrous sulfate (FERROUSUL) 325 (65 FE) MG tablet Take 1 tablet (325 mg total) by mouth 3 (three) times daily with meals. 90 tablet 1  . magnesium oxide (MAG-OX) 400 (241.3 Mg) MG tablet Take 1 tablet (400 mg total) by mouth 3 (three) times daily. 90 tablet 1  . oxyCODONE-acetaminophen (PERCOCET) 7.5-325 MG tablet Take 1 tablet by mouth every 6 (six) hours as needed for severe pain. 25 tablet 0  . potassium chloride SA  (KLOR-CON) 10 MEQ tablet Take 2 tablets (20 mEq total) by mouth 2 (two) times daily. 120 tablet 3  . prochlorperazine (COMPAZINE) 10 MG tablet Take 1 tablet (10 mg total) by mouth every 6 (six) hours as needed for nausea or vomiting. 30 tablet 3  . loratadine (CLARITIN) 10 MG tablet Take 10 mg by mouth daily.    . pertuzumab in sodium chloride 0.9 % 250 mL Inject into the vein every 21 ( twenty-one) days.    . trastuzumab-dkst 2 mg/kg in sodium chloride 0.9 % 250 mL Inject into the vein every 21 ( twenty-one) days.     No current facility-administered medications for this encounter.    REVIEW OF SYSTEMS:  A 10+ POINT REVIEW OF SYSTEMS WAS OBTAINED including neurology, dermatology, psychiatry, cardiac, respiratory, lymph, extremities, GI, GU, musculoskeletal, constitutional, reproductive, HEENT.  Patient denies any pain along the right chest wall axillary area or upper arm.  She denies any numbness or tingling.  She also denies any swelling in her right arm or hand.   PHYSICAL EXAM:  height is 4' 8"  (1.422 m) and weight is 149 lb 3.2 oz (67.7 kg). Her temperature is 98.9 F (37.2 C). Her blood pressure is 121/83 and her pulse is 61. Her respiration is 18 and oxygen saturation is 100%.   General: Alert and oriented, in no acute distress HEENT:  Head is normocephalic. Extraocular movements are intact.  Neck: Neck is supple, no palpable cervical or supraclavicular lymphadenopathy. Heart: Regular in rate and rhythm with no murmurs, rubs, or gallops. Chest: Clear to auscultation bilaterally, with no rhonchi, wheezes, or rales. Abdomen: Soft, nontender, nondistended, with no rigidity or guarding. Extremities: No cyanosis or edema. Lymphatics: see Neck Exam Skin: No concerning lesions. Musculoskeletal: symmetric strength and muscle tone throughout. Neurologic: Cranial nerves II through XII are grossly intact. No obvious focalities. Speech is fluent. Coordination is intact. Psychiatric: Judgment and  insight are intact. Affect is appropriate. Left chest wall shows a mastectomy scar which is healed well without signs of drainage or infection. Right chest wall area shows a mastectomy scar which is healed well without signs of drainage or infection  ECOG = 1  0 - Asymptomatic (Fully active, able to carry on all predisease activities without restriction)  1 - Symptomatic but completely ambulatory (Restricted in physically strenuous activity but ambulatory and able to carry out work of a light or sedentary nature. For example, light housework, office work)  2 - Symptomatic, <50% in bed during the day (Ambulatory and capable of all self care but unable to carry out any work activities. Up and about more than 50% of waking hours)  3 - Symptomatic, >50% in bed, but not bedbound (Capable of only limited self-care, confined to bed or chair 50% or more of waking hours)  4 - Bedbound (Completely disabled. Cannot carry on any self-care. Totally confined to bed or chair)  5 - Death   Eustace Pen MM, Creech RH, Tormey DC, et al. [phone]). "Toxicity and response criteria of the [facility]". [place] Oncol. 5 (6): 649-55  LABORATORY DATA:  Lab Results  Component Value Date   WBC 4.2 02/13/2020   HGB 10.8 (L) 02/13/2020   HCT 34.7 (L) 02/13/2020   MCV 95.6 02/13/2020   PLT 271 02/13/2020   NEUTROABS 2.0 02/13/2020   Lab Results  Component Value Date   NA 140 02/13/2020   K 3.9 02/13/2020   CL 104 02/13/2020   CO2 26 02/13/2020   GLUCOSE 110 (H) 02/13/2020   CREATININE 0.58 02/13/2020   CALCIUM 9.9 02/13/2020      RADIOGRAPHY: No results found.    IMPRESSION: Stage ypT0, ypN0 Right Breast UOQ, Invasive Ductal Carcinoma, ER+ / PR- / Her2+, Grade 2  As above the patient had an excellent response to her neoadjuvant chemotherapy with complete pathologic response.  Today we discussed the general indications for postmastectomy radiation therapy that being lymph node  positivity and large tumors (T3 or T4).  We also discussed indications for radiation therapy when surgical margins are positive.  In reviewing the patient's surgical procedure and initial clinical stage she does not meet the criteria for postmastectomy radiation therapy.  NCCN guidelines do recommend consideration for postmastectomy radiation therapy for T2 lesions in women with triple negative, premenopausal situations but again pathology and clinical stage for her do not meet these criteria.  Prior to making final recommendations by however would like to review current literature as it relates to BRCA1 mutations and the risk for chest wall recurrence as in her situation.  Would also like to discuss this case further with radiation therapy colleagues.  Today I discussed the general course of postmastectomy radiation therapy anticipated side effects and potential long-term toxicities.  Patient has a good understanding of this course of treatment in the event that radiation therapy is recommended for her.  PLAN:  Final recommendations pending review of BRAC-1 mutation issues and further discussion with radiation therapy colleagues.    ------------------------------------------------  Blair Promise, PhD, MD  This document serves as a record of services personally performed by Gery Pray, MD. It was created on his behalf by Clerance Lav, a trained medical scribe. The creation of this record is based on the scribe's personal observations and the provider's statements to them. This document has been checked and approved by the attending provider.

## 2020-03-05 ENCOUNTER — Inpatient Hospital Stay (HOSPITAL_COMMUNITY): Payer: Medicaid Other | Attending: Hematology

## 2020-03-05 ENCOUNTER — Ambulatory Visit (HOSPITAL_COMMUNITY): Payer: Medicaid Other | Admitting: Hematology

## 2020-03-05 ENCOUNTER — Other Ambulatory Visit (HOSPITAL_COMMUNITY): Payer: Medicaid Other

## 2020-03-05 ENCOUNTER — Inpatient Hospital Stay (HOSPITAL_COMMUNITY): Payer: Medicaid Other

## 2020-03-05 DIAGNOSIS — Z170 Estrogen receptor positive status [ER+]: Secondary | ICD-10-CM | POA: Diagnosis not present

## 2020-03-05 DIAGNOSIS — C50411 Malignant neoplasm of upper-outer quadrant of right female breast: Secondary | ICD-10-CM | POA: Diagnosis present

## 2020-03-05 DIAGNOSIS — Z9013 Acquired absence of bilateral breasts and nipples: Secondary | ICD-10-CM | POA: Diagnosis not present

## 2020-03-05 DIAGNOSIS — Z79899 Other long term (current) drug therapy: Secondary | ICD-10-CM | POA: Insufficient documentation

## 2020-03-05 DIAGNOSIS — Z5112 Encounter for antineoplastic immunotherapy: Secondary | ICD-10-CM | POA: Insufficient documentation

## 2020-03-05 LAB — CBC WITH DIFFERENTIAL/PLATELET
Abs Immature Granulocytes: 0.01 10*3/uL (ref 0.00–0.07)
Basophils Absolute: 0 10*3/uL (ref 0.0–0.1)
Basophils Relative: 0 %
Eosinophils Absolute: 0 10*3/uL (ref 0.0–0.5)
Eosinophils Relative: 1 %
HCT: 39.7 % (ref 36.0–46.0)
Hemoglobin: 12.7 g/dL (ref 12.0–15.0)
Immature Granulocytes: 0 %
Lymphocytes Relative: 50 %
Lymphs Abs: 2.3 10*3/uL (ref 0.7–4.0)
MCH: 29.5 pg (ref 26.0–34.0)
MCHC: 32 g/dL (ref 30.0–36.0)
MCV: 92.1 fL (ref 80.0–100.0)
Monocytes Absolute: 0.3 10*3/uL (ref 0.1–1.0)
Monocytes Relative: 6 %
Neutro Abs: 2 10*3/uL (ref 1.7–7.7)
Neutrophils Relative %: 43 %
Platelets: 285 10*3/uL (ref 150–400)
RBC: 4.31 MIL/uL (ref 3.87–5.11)
RDW: 13.9 % (ref 11.5–15.5)
WBC: 4.7 10*3/uL (ref 4.0–10.5)
nRBC: 0 % (ref 0.0–0.2)

## 2020-03-05 LAB — COMPREHENSIVE METABOLIC PANEL
ALT: 26 U/L (ref 0–44)
AST: 20 U/L (ref 15–41)
Albumin: 3.9 g/dL (ref 3.5–5.0)
Alkaline Phosphatase: 76 U/L (ref 38–126)
Anion gap: 10 (ref 5–15)
BUN: 17 mg/dL (ref 6–20)
CO2: 25 mmol/L (ref 22–32)
Calcium: 10.2 mg/dL (ref 8.9–10.3)
Chloride: 103 mmol/L (ref 98–111)
Creatinine, Ser: 0.61 mg/dL (ref 0.44–1.00)
GFR calc Af Amer: >60 mL/min (ref >60)
GFR calc non Af Amer: >60 mL/min (ref >60)
Glucose, Bld: 122 mg/dL — ABNORMAL HIGH (ref 70–99)
Potassium: 3.8 mmol/L (ref 3.5–5.1)
Sodium: 138 mmol/L (ref 135–145)
Total Bilirubin: 0.6 mg/dL (ref 0.3–1.2)
Total Protein: 7.4 g/dL (ref 6.5–8.1)

## 2020-03-05 LAB — MAGNESIUM: Magnesium: 1.9 mg/dL (ref 1.7–2.4)

## 2020-03-05 MED ORDER — ACETAMINOPHEN 325 MG PO TABS
650.0000 mg | ORAL_TABLET | Freq: Once | ORAL | Status: AC
  Administered 2020-03-05: 650 mg via ORAL
  Filled 2020-03-05: qty 2

## 2020-03-05 MED ORDER — HEPARIN SOD (PORK) LOCK FLUSH 100 UNIT/ML IV SOLN
500.0000 [IU] | Freq: Once | INTRAVENOUS | Status: AC | PRN
  Administered 2020-03-05: 500 [IU]

## 2020-03-05 MED ORDER — TRASTUZUMAB-DKST CHEMO 150 MG IV SOLR
6.0000 mg/kg | Freq: Once | INTRAVENOUS | Status: AC
  Administered 2020-03-05: 420 mg via INTRAVENOUS
  Filled 2020-03-05: qty 20

## 2020-03-05 MED ORDER — SODIUM CHLORIDE 0.9% FLUSH
10.0000 mL | INTRAVENOUS | Status: DC | PRN
  Administered 2020-03-05: 10 mL

## 2020-03-05 MED ORDER — SODIUM CHLORIDE 0.9 % IV SOLN
420.0000 mg | Freq: Once | INTRAVENOUS | Status: AC
  Administered 2020-03-05: 420 mg via INTRAVENOUS
  Filled 2020-03-05: qty 14

## 2020-03-05 MED ORDER — DIPHENHYDRAMINE HCL 25 MG PO CAPS
25.0000 mg | ORAL_CAPSULE | Freq: Once | ORAL | Status: AC
  Administered 2020-03-05: 25 mg via ORAL
  Filled 2020-03-05: qty 1

## 2020-03-05 MED ORDER — SODIUM CHLORIDE 0.9 % IV SOLN: Freq: Once | INTRAVENOUS | Status: AC

## 2020-03-05 NOTE — Patient Instructions (Signed)
[facility] Discharge Instructions for Patients Receiving Chemotherapy   Beginning January 23rd 2017 lab work for the [facility] will be done in the  Main lab at [facility] on 1st floor. If you have a lab appointment with the [facility] please come in thru the  Main Entrance and check in at the main information desk   Today you received the following chemotherapy agents Ogivri and Perjeta  To help prevent nausea and vomiting after your treatment, we encourage you to take your nausea medication    If you develop nausea and vomiting, or diarrhea that is not controlled by your medication, call the clinic.  The clinic phone number is [phone]. Office hours are Monday-Friday 8:30am-5:00pm.  BELOW ARE SYMPTOMS THAT SHOULD BE REPORTED IMMEDIATELY:  *FEVER GREATER THAN 101.0 F  *CHILLS WITH OR WITHOUT FEVER  NAUSEA AND VOMITING THAT IS NOT CONTROLLED WITH YOUR NAUSEA MEDICATION  *UNUSUAL SHORTNESS OF BREATH  *UNUSUAL BRUISING OR BLEEDING  TENDERNESS IN MOUTH AND THROAT WITH OR WITHOUT PRESENCE OF ULCERS  *URINARY PROBLEMS  *BOWEL PROBLEMS  UNUSUAL RASH Items with * indicate a potential emergency and should be followed up as soon as possible. If you have an emergency after office hours please contact your primary care physician or go to the nearest emergency department.  Please call the clinic during office hours if you have any questions or concerns.   You may also contact the Patient Navigator at [phone] should you have any questions or need assistance in obtaining follow up care.      Resources For Cancer Patients and their Caregivers ? American Cancer Society: Can assist with transportation, wigs, general needs, runs Look Good Feel Better.        682-097-9353 ? Cancer Care: Provides financial assistance, online support groups, medication/co-pay assistance.  1-800-813-HOPE (928)841-0213) ? [place] Assists  [place] cancer patients and their families through emotional , educational and financial support.  917-146-0734 ? [place] DSS Where to apply for food stamps, Medicaid and utility assistance. 317-206-8155 ? [facility]: Transportation to medical appointments. 636-640-6102 ? Social Security Administration: May apply for disability if have a Stage IV cancer. 707-387-4064 682-021-1312 ? [employer], Disability and Transit Services: Assists with nutrition, care and transit needs. ([phone]

## 2020-03-05 NOTE — Progress Notes (Signed)
Monica Hancock tolerated treatment today without incident or complaint. VSS. Discharged in satisfactory condition with follow up instructions.

## 2020-03-10 NOTE — Progress Notes (Signed)
Pap was NEGATIVE for HPV types. The ASCUS interpretation, Atypical Squamous Cells of Uncertain Significance is less a concern. The current recommendations are repeat pap in 3 yrs, by testing for HPV.  We can proceed with surgery and leave the cervix with confidence , and follow with pap / HPV in 3 years.

## 2020-03-26 ENCOUNTER — Other Ambulatory Visit: Payer: Self-pay

## 2020-03-26 ENCOUNTER — Encounter (HOSPITAL_COMMUNITY): Payer: Self-pay | Admitting: Hematology

## 2020-03-26 ENCOUNTER — Inpatient Hospital Stay (HOSPITAL_COMMUNITY): Payer: Medicaid Other

## 2020-03-26 ENCOUNTER — Inpatient Hospital Stay (HOSPITAL_BASED_OUTPATIENT_CLINIC_OR_DEPARTMENT_OTHER): Payer: Medicaid Other | Admitting: Hematology

## 2020-03-26 ENCOUNTER — Inpatient Hospital Stay (HOSPITAL_COMMUNITY): Payer: Medicaid Other | Attending: Hematology

## 2020-03-26 VITALS — BP 114/76 | HR 59 | Temp 98.1°F | Resp 18

## 2020-03-26 DIAGNOSIS — Z170 Estrogen receptor positive status [ER+]: Secondary | ICD-10-CM

## 2020-03-26 DIAGNOSIS — Z79899 Other long term (current) drug therapy: Secondary | ICD-10-CM | POA: Insufficient documentation

## 2020-03-26 DIAGNOSIS — E876 Hypokalemia: Secondary | ICD-10-CM | POA: Insufficient documentation

## 2020-03-26 DIAGNOSIS — C50411 Malignant neoplasm of upper-outer quadrant of right female breast: Secondary | ICD-10-CM

## 2020-03-26 DIAGNOSIS — Z800 Family history of malignant neoplasm of digestive organs: Secondary | ICD-10-CM | POA: Diagnosis not present

## 2020-03-26 DIAGNOSIS — Z5112 Encounter for antineoplastic immunotherapy: Secondary | ICD-10-CM | POA: Insufficient documentation

## 2020-03-26 DIAGNOSIS — Z833 Family history of diabetes mellitus: Secondary | ICD-10-CM | POA: Insufficient documentation

## 2020-03-26 DIAGNOSIS — Z803 Family history of malignant neoplasm of breast: Secondary | ICD-10-CM | POA: Insufficient documentation

## 2020-03-26 DIAGNOSIS — Z5111 Encounter for antineoplastic chemotherapy: Secondary | ICD-10-CM | POA: Insufficient documentation

## 2020-03-26 DIAGNOSIS — Z9013 Acquired absence of bilateral breasts and nipples: Secondary | ICD-10-CM | POA: Insufficient documentation

## 2020-03-26 LAB — COMPREHENSIVE METABOLIC PANEL
ALT: 29 U/L (ref 0–44)
AST: 19 U/L (ref 15–41)
Albumin: 4.1 g/dL (ref 3.5–5.0)
Alkaline Phosphatase: 80 U/L (ref 38–126)
Anion gap: 9 (ref 5–15)
BUN: 13 mg/dL (ref 6–20)
CO2: 25 mmol/L (ref 22–32)
Calcium: 10 mg/dL (ref 8.9–10.3)
Chloride: 103 mmol/L (ref 98–111)
Creatinine, Ser: 0.5 mg/dL (ref 0.44–1.00)
GFR calc Af Amer: >60 mL/min (ref >60)
GFR calc non Af Amer: >60 mL/min (ref >60)
Glucose, Bld: 103 mg/dL — ABNORMAL HIGH (ref 70–99)
Potassium: 3.8 mmol/L (ref 3.5–5.1)
Sodium: 137 mmol/L (ref 135–145)
Total Bilirubin: 0.6 mg/dL (ref 0.3–1.2)
Total Protein: 7.7 g/dL (ref 6.5–8.1)

## 2020-03-26 LAB — CBC WITH DIFFERENTIAL/PLATELET
Abs Immature Granulocytes: 0.02 10*3/uL (ref 0.00–0.07)
Basophils Absolute: 0 10*3/uL (ref 0.0–0.1)
Basophils Relative: 0 %
Eosinophils Absolute: 0.1 10*3/uL (ref 0.0–0.5)
Eosinophils Relative: 2 %
HCT: 41 % (ref 36.0–46.0)
Hemoglobin: 13.4 g/dL (ref 12.0–15.0)
Immature Granulocytes: 0 %
Lymphocytes Relative: 48 %
Lymphs Abs: 2.6 10*3/uL (ref 0.7–4.0)
MCH: 28.9 pg (ref 26.0–34.0)
MCHC: 32.7 g/dL (ref 30.0–36.0)
MCV: 88.6 fL (ref 80.0–100.0)
Monocytes Absolute: 0.3 10*3/uL (ref 0.1–1.0)
Monocytes Relative: 6 %
Neutro Abs: 2.4 10*3/uL (ref 1.7–7.7)
Neutrophils Relative %: 44 %
Platelets: 288 10*3/uL (ref 150–400)
RBC: 4.63 MIL/uL (ref 3.87–5.11)
RDW: 13.2 % (ref 11.5–15.5)
WBC: 5.5 10*3/uL (ref 4.0–10.5)
nRBC: 0 % (ref 0.0–0.2)

## 2020-03-26 LAB — MAGNESIUM: Magnesium: 1.8 mg/dL (ref 1.7–2.4)

## 2020-03-26 MED ORDER — ACETAMINOPHEN 325 MG PO TABS
650.0000 mg | ORAL_TABLET | Freq: Once | ORAL | Status: AC
  Administered 2020-03-26: 650 mg via ORAL
  Filled 2020-03-26: qty 2

## 2020-03-26 MED ORDER — HEPARIN SOD (PORK) LOCK FLUSH 100 UNIT/ML IV SOLN
500.0000 [IU] | Freq: Once | INTRAVENOUS | Status: AC | PRN
  Administered 2020-03-26: 500 [IU]

## 2020-03-26 MED ORDER — DIPHENHYDRAMINE HCL 25 MG PO CAPS
25.0000 mg | ORAL_CAPSULE | Freq: Once | ORAL | Status: AC
  Administered 2020-03-26: 25 mg via ORAL
  Filled 2020-03-26: qty 1

## 2020-03-26 MED ORDER — TRASTUZUMAB-DKST CHEMO 150 MG IV SOLR
6.0000 mg/kg | Freq: Once | INTRAVENOUS | Status: AC
  Administered 2020-03-26: 420 mg via INTRAVENOUS
  Filled 2020-03-26: qty 20

## 2020-03-26 MED ORDER — ANASTROZOLE 1 MG PO TABS
1.0000 mg | ORAL_TABLET | Freq: Every day | ORAL | 6 refills | Status: DC
Start: 2020-03-26 — End: 2020-04-22

## 2020-03-26 MED ORDER — SODIUM CHLORIDE 0.9 % IV SOLN
420.0000 mg | Freq: Once | INTRAVENOUS | Status: AC
  Administered 2020-03-26: 420 mg via INTRAVENOUS
  Filled 2020-03-26: qty 14

## 2020-03-26 MED ORDER — SODIUM CHLORIDE 0.9 % IV SOLN: Freq: Once | INTRAVENOUS | Status: AC

## 2020-03-26 MED ORDER — SODIUM CHLORIDE 0.9% FLUSH
10.0000 mL | INTRAVENOUS | Status: DC | PRN
  Administered 2020-03-26: 10 mL

## 2020-03-26 NOTE — Patient Instructions (Signed)
[facility] Discharge Instructions  Labs drawn from portacath today   Thank you for choosing  Cancer Center at [facility] to provide your oncology and hematology care.  To afford each patient quality time with our provider, please arrive at least 15 minutes before your scheduled appointment time.   If you have a lab appointment with the Cancer Center please come in thru the Main Entrance and check in at the main information desk.  You need to re-schedule your appointment should you arrive 10 or more minutes late.  We strive to give you quality time with our providers, and arriving late affects you and other patients whose appointments are after yours.  Also, if you no show three or more times for appointments you may be dismissed from the clinic at the providers discretion.     Again, thank you for choosing [facility].  Our hope is that these requests will decrease the amount of time that you wait before being seen by our physicians.       _____________________________________________________________  Should you have questions after your visit to [facility], please contact our office at [phone] between the hours of 8:00 a.m. and 4:30 p.m.  Voicemails left after 4:00 p.m. will not be returned until the following business day.  For prescription refill requests, have your pharmacy contact our office and allow 72 hours.    Due to Covid, you will need to wear a mask upon entering the hospital. If you do not have a mask, a mask will be given to you at the Main Entrance upon arrival. For doctor visits, patients may have 1 support person with them. For treatment visits, patients can not have anyone with them due to social distancing guidelines and our immunocompromised population.     

## 2020-03-26 NOTE — Patient Instructions (Signed)
[facility] Discharge Instructions for Patients Receiving Chemotherapy   Beginning January 23rd 2017 lab work for the Cancer Center will be done in the  Main lab at [place] on 1st floor. If you have a lab appointment with the Cancer Center please come in thru the  Main Entrance and check in at the main information desk   Today you received the following chemotherapy agents Herceptin and Perjeta. Follow-up as scheduled. Call clinic for any questions or concerns  To help prevent nausea and vomiting after your treatment, we encourage you to take your nausea medication   If you develop nausea and vomiting, or diarrhea that is not controlled by your medication, call the clinic.  The clinic phone number is [phone]. Office hours are Monday-Friday 8:30am-5:00pm.  BELOW ARE SYMPTOMS THAT SHOULD BE REPORTED IMMEDIATELY:  *FEVER GREATER THAN 101.0 F  *CHILLS WITH OR WITHOUT FEVER  NAUSEA AND VOMITING THAT IS NOT CONTROLLED WITH YOUR NAUSEA MEDICATION  *UNUSUAL SHORTNESS OF BREATH  *UNUSUAL BRUISING OR BLEEDING  TENDERNESS IN MOUTH AND THROAT WITH OR WITHOUT PRESENCE OF ULCERS  *URINARY PROBLEMS  *BOWEL PROBLEMS  UNUSUAL RASH Items with * indicate a potential emergency and should be followed up as soon as possible. If you have an emergency after office hours please contact your primary care physician or go to the nearest emergency department.  Please call the clinic during office hours if you have any questions or concerns.   You may also contact the Patient Navigator at [phone] should you have any questions or need assistance in obtaining follow up care.      Resources For Cancer Patients and their Caregivers ? American Cancer Society: Can assist with transportation, wigs, general needs, runs Look Good Feel Better.        1-888-227-6333 ? Cancer Care: Provides financial assistance, online support groups, medication/co-pay assistance.   1-800-813-HOPE (4673) ? [facility] Assists [place] cancer patients and their families through emotional , educational and financial support.  336-427-4357 ? [place] DSS Where to apply for food stamps, Medicaid and utility assistance. 336-342-1394 ? [employer]: Transportation to medical appointments. 336-347-2287 ? Social Security Administration: May apply for disability if have a Stage IV cancer. 336-342-7796 1-800-772-1213 ? [place] Aging, Disability and Transit Services: Assists with nutrition, care and transit needs. 336-349-2343         

## 2020-03-26 NOTE — Progress Notes (Signed)
Monica Hancock, [place] [address]   CLINIC:  Medical Oncology/Hematology  PCP:  Monica Hancock, MD [place] [address] [phone]   REASON FOR VISIT:  HER-2 positive right breast cancer  CURRENT THERAPY: Maintenance Herceptin and Pertuzumab.    INTERVAL HISTORY:  Monica Hancock 39 y.o. female seen for follow-up of right breast cancer and toxicity assessment prior to Herceptin and Pertuzumab maintenance therapy.  She reports that her jaw locks up when she is upset.  She has to move her mouth for it to go away.  Usually it lasts about few seconds.  She is taking potassium and magnesium supplements.  Diarrhea has improved.  Denies any signs or symptoms of PND or orthopnea.  Appetite and energy levels are 100%.  REVIEW OF SYSTEMS:  Review of Systems  All other systems reviewed and are negative.    PAST MEDICAL/SURGICAL HISTORY:  Past Medical History:  Diagnosis Date  . BRCA1 positive   . Cancer [facility])    Right Breast  . Family history of adverse reaction to anesthesia    PONV-children  . Family history of BRCA1 gene positive   . Family history of breast cancer   . H/O Bell's palsy 2000   Left sided  . HSV infection    Past Surgical History:  Procedure Laterality Date  . BREAST BIOPSY Right   . IR IMAGING GUIDED PORT INSERTION  08/26/2019  . MASTECTOMY MODIFIED RADICAL Right 01/24/2020   Procedure: RIGHT MASTECTOMY MODIFIED RADICAL;  Surgeon: Aviva Signs, MD;  Location: [facility];  Service: General;  Laterality: Right;  . SIMPLE MASTECTOMY WITH AXILLARY SENTINEL NODE BIOPSY Left 01/24/2020   Procedure: LEFT SIMPLE MASTECTOMY;  Surgeon: Aviva Signs, MD;  Location: [facility];  Service: General;  Laterality: Left;  . TUBAL LIGATION       SOCIAL HISTORY:  Social History   Socioeconomic History  . Marital status: Married    Spouse name: [name]  . Number of children: 5  . Years of education: Not on file  . Highest education  level: 12th grade  Occupational History  . Not on file  Tobacco Use  . Smoking status: Never Smoker  . Smokeless tobacco: Never Used  Substance and Sexual Activity  . Alcohol use: No  . Drug use: No  . Sexual activity: Yes    Birth control/protection: Surgical    Comment: tubal  Other Topics Concern  . Not on file  Social History Narrative  . Not on file   Social Determinants of Health   Financial Resource Strain: Medium Risk  . Difficulty of Paying Living Expenses: Somewhat hard  Food Insecurity: Food Insecurity Present  . Worried About Charity fundraiser in the Last Year: Sometimes true  . Ran Out of Food in the Last Year: Sometimes true  Transportation Needs: No Transportation Needs  . Lack of Transportation (Medical): No  . Lack of Transportation (Non-Medical): No  Physical Activity: Inactive  . Days of Exercise per Week: 0 days  . Minutes of Exercise per Session: 0 min  Stress: No Stress Concern Present  . Feeling of Stress : Only a little  Social Connections: Not Isolated  . Frequency of Communication with Friends and Family: More than three times a week  . Frequency of Social Gatherings with Friends and Family: More than three times a week  . Attends Religious Services: More than 4 times per year  . Active Member of Clubs  or Organizations: Yes  . Attends Archivist Meetings: More than 4 times per year  . Marital Status: Married  Human resources officer Violence: Not At Risk  . Fear of Current or Ex-Partner: No  . Emotionally Abused: No  . Physically Abused: No  . Sexually Abused: No    FAMILY HISTORY:  Family History  Problem Relation Age of Onset  . Diabetes Mother   . Breast cancer Mother 78  . Cancer Mother   . Diabetes Maternal Grandmother   . Stomach cancer Maternal Grandfather   . Breast cancer Maternal Aunt        dx in her 65s  . Diabetes Maternal Aunt     CURRENT MEDICATIONS:  Outpatient Encounter Medications as of 03/26/2020  Medication  Sig Note  . diphenhydrAMINE (BENADRYL) 50 MG tablet Take 50 mg by mouth daily.   . ferrous sulfate (FERROUSUL) 325 (65 FE) MG tablet Take 1 tablet (325 mg total) by mouth 3 (three) times daily with meals.   Marland Kitchen loratadine (CLARITIN) 10 MG tablet Take 10 mg by mouth daily. 01/23/2020: Last dose 12/10/19  . magnesium oxide (MAG-OX) 400 (241.3 Mg) MG tablet Take 1 tablet (400 mg total) by mouth 3 (three) times daily.   . pertuzumab in sodium chloride 0.9 % 250 mL Inject into the vein every 21 ( twenty-one) days. 01/23/2020: Last dose 12/10/19  . potassium chloride SA (KLOR-CON) 10 MEQ tablet Take 2 tablets (20 mEq total) by mouth 2 (two) times daily.   . trastuzumab-dkst 2 mg/kg in sodium chloride 0.9 % 250 mL Inject into the vein every 21 ( twenty-one) days. 01/23/2020: Last dose 12/10/19  . anastrozole (ARIMIDEX) 1 MG tablet Take 1 tablet (1 mg total) by mouth daily.   . cyclobenzaprine (FLEXERIL) 10 MG tablet 1/2 po qhs (Patient not taking: Reported on 03/26/2020)   . oxyCODONE-acetaminophen (PERCOCET) 7.5-325 MG tablet Take 1 tablet by mouth every 6 (six) hours as needed for severe pain. (Patient not taking: Reported on 03/26/2020)   . prochlorperazine (COMPAZINE) 10 MG tablet Take 1 tablet (10 mg total) by mouth every 6 (six) hours as needed for nausea or vomiting. (Patient not taking: Reported on 03/26/2020)    No facility-administered encounter medications on file as of 03/26/2020.    ALLERGIES:  No Known Allergies   PHYSICAL EXAM:  ECOG Performance status: 0  Vitals:   03/26/20 0954  BP: 123/80  Pulse: 62  Resp: 18  Temp: (!) 96.9 F (36.1 C)  SpO2: 100%   Filed Weights   03/26/20 0954  Weight: 148 lb 12.8 oz (67.5 kg)    Physical Exam Vitals reviewed.  Constitutional:      Appearance: Normal appearance.  Cardiovascular:     Rate and Rhythm: Normal rate and regular rhythm.     Heart sounds: Normal heart sounds.  Pulmonary:     Effort: Pulmonary effort is normal.     Breath sounds:  Normal breath sounds.  Abdominal:     General: There is no distension.     Palpations: Abdomen is soft. There is no mass.  Musculoskeletal:        General: No swelling.  Lymphadenopathy:     Cervical: No cervical adenopathy.  Skin:    General: Skin is warm.  Neurological:     General: No focal deficit present.     Mental Status: She is alert and oriented to person, place, and time.  Psychiatric:        Mood and Affect:  Mood normal.        Behavior: Behavior normal.    LABORATORY DATA:  I have reviewed the labs as listed.  CBC    Component Value Date/Time   WBC 5.5 03/26/2020 1004   RBC 4.63 03/26/2020 1004   HGB 13.4 03/26/2020 1004   HCT 41.0 03/26/2020 1004   PLT 288 03/26/2020 1004   MCV 88.6 03/26/2020 1004   MCH 28.9 03/26/2020 1004   MCHC 32.7 03/26/2020 1004   RDW 13.2 03/26/2020 1004   LYMPHSABS 2.6 03/26/2020 1004   MONOABS 0.3 03/26/2020 1004   EOSABS 0.1 03/26/2020 1004   BASOSABS 0.0 03/26/2020 1004   CMP Latest Ref Rng & Units 03/26/2020 03/05/2020 02/13/2020  Glucose 70 - 99 mg/dL 103(H) 122(H) 110(H)  BUN 6 - 20 mg/dL _0 Creatinine 0.44 - 1.00 mg/dL 0.50 0.61 0.58  Sodium 135 - 145 mmol/L 137 138 140  Potassium 3.5 - 5.1 mmol/L 3.8 3.8 3.9  Chloride 98 - 111 mmol/L 103 103 104  CO2 22 - 32 mmol/L _1 Calcium 8.9 - 10.3 mg/dL 10.0 10.2 9.9  Total Protein 6.5 - 8.1 g/dL 7.7 7.4 7.0  Total Bilirubin 0.3 - 1.2 mg/dL 0.6 0.6 0.5  Alkaline Phos 38 - 126 U/L 80 76 68  AST 15 - 41 U/L _2 ALT 0 - 44 U/L _3 DIAGNOSTIC IMAGING:  I have independently reviewed scans.    ASSESSMENT & PLAN:   Malignant neoplasm of upper-outer quadrant of right female breast ([place]) 1.  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 -Herceptin and Pertuzumab maintenance started on 01/02/2020. -Bilateral mastectomies on 01/24/2020.  Pathology showed YPT0Y PN 0,  0/6 lymph nodes positive. -She will continue Herceptin and Pertuzumab.  She is tolerating it very well. -She met with Dr. Sondra Come had who did not recommend any adjuvant radiation. -I have reviewed her labs today.  Her electrolytes are normal.  LFTs are also normal.  She will proceed with her treatment today. -I talked to her about starting her on antiestrogen therapy with anastrozole as she will be having oophorectomies done. -We talked about the side effects of anastrozole including hot flashes, musculoskeletal symptoms, decreased bone mineral density and a chance of thromboembolic phenomena.  We plan to do a bone density test in 2 years. -We have sent a prescription which she will start taking shortly after surgery. -I will see her back in 6 weeks for follow-up.  2.  BRCA1 positive: -She is seeing [name] later this week to schedule bilateral oophorectomy on 04/14/2020.  3.  Hypokalemia: -Potassium is 3.8 today.  She will continue potassium 20 mEq twice daily.  4.  Hypomagnesemia: -Magnesium today is 1.8. -She may cut back on magnesium to twice daily.  5.  High risk drug monitoring: -2D echo on 12/30/2019 shows EF 60-65%.  We will plan to repeat echocardiogram in 3 weeks.  6.  She complains of feeling of jaw locking up occasionally.  She has to move her mouth for few seconds for it to go away. She was told to follow-up with her dentist.   Orders placed this encounter:  No orders of the defined types were placed in this encounter.    Derek Jack, MD [place] [phone]

## 2020-03-26 NOTE — Progress Notes (Signed)
Patient has been assessed, vital signs and labs have been reviewed by Dr. [name]. ANC, Creatinine, LFTs, and Platelets are within treatment parameters per Dr. [name]. The patient is good to proceed with treatment at this time.  

## 2020-03-26 NOTE — Assessment & Plan Note (Signed)
1.  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 -Herceptin and Pertuzumab maintenance started on 01/02/2020. -Bilateral mastectomies on 01/24/2020.  Pathology showed YPT0Y PN 0, 0/6 lymph nodes positive. -She will continue Herceptin and Pertuzumab.  She is tolerating it very well. -She met with Dr. Sondra Come had who did not recommend any adjuvant radiation. -I have reviewed her labs today.  Her electrolytes are normal.  LFTs are also normal.  She will proceed with her treatment today. -I talked to her about starting her on antiestrogen therapy with anastrozole as she will be having oophorectomies done. -We talked about the side effects of anastrozole including hot flashes, musculoskeletal symptoms, decreased bone mineral density and a chance of thromboembolic phenomena.  We plan to do a bone density test in 2 years. -We have sent a prescription which she will start taking shortly after surgery. -I will see her back in 6 weeks for follow-up.  2.  BRCA1 positive: -She is seeing [name] later this week to schedule bilateral oophorectomy on 04/14/2020.  3.  Hypokalemia: -Potassium is 3.8 today.  She will continue potassium 20 mEq twice daily.  4.  Hypomagnesemia: -Magnesium today is 1.8. -She may cut back on magnesium to twice daily.  5.  High risk drug monitoring: -2D echo on 12/30/2019 shows EF 60-65%.  We will plan to repeat echocardiogram in 3 weeks.  6.  She complains of feeling of jaw locking up occasionally.  She has to move her mouth for few seconds for it to go away. She was told to follow-up with her dentist.

## 2020-03-26 NOTE — Progress Notes (Signed)
[place] reviewed with and pt seen by Dr. Delton Coombes and pt approved for Herceptin and Perjeta infusions today per MD           Monica Hancock tolerated Herceptin and Perjeta infusions well without complaints or incident. VSS upon discharge. Pt discharged self ambulatory in satisfactory condition

## 2020-03-26 NOTE — Patient Instructions (Addendum)
[place] at [facility] Discharge Instructions  You were seen today by Dr. Delton Coombes. He went over your recent lab results. He will send in a new prescription of Anastrozole to your pharmacy to start after your surgery. He will see you back in 6 weeks for labs, treatment and follow up.   Thank you for choosing [place] at [facility] to provide your oncology and hematology care.  To afford each patient quality time with our provider, please arrive at least 15 minutes before your scheduled appointment time.   If you have a lab appointment with the [facility] please come in thru the  Main Entrance and check in at the main information desk  You need to re-schedule your appointment should you arrive 10 or more minutes late.  We strive to give you quality time with our providers, and arriving late affects you and other patients whose appointments are after yours.  Also, if you no show three or more times for appointments you may be dismissed from the clinic at the providers discretion.     Again, thank you for choosing [facility]  Our hope is that these requests will decrease the amount of time that you wait before being seen by our physicians.       _____________________________________________________________  Should you have questions after your visit to [facility], please contact our office at [phone] between the hours of 8:00 a.m. and 4:30 p.m.  Voicemails left after 4:00 p.m. will not be returned until the following business day.  For prescription refill requests, have your pharmacy contact our office and allow 72 hours.    Cancer Center Support Programs:   > Cancer Support Group  2nd Tuesday of the month 1pm-2pm, Journey Room

## 2020-03-26 NOTE — Progress Notes (Signed)

## 2020-03-30 ENCOUNTER — Other Ambulatory Visit (HOSPITAL_COMMUNITY): Payer: Self-pay | Admitting: *Deleted

## 2020-03-30 DIAGNOSIS — Z170 Estrogen receptor positive status [ER+]: Secondary | ICD-10-CM

## 2020-04-01 ENCOUNTER — Ambulatory Visit: Payer: Medicaid Other | Admitting: Obstetrics and Gynecology

## 2020-04-01 ENCOUNTER — Encounter: Payer: Self-pay | Admitting: Obstetrics and Gynecology

## 2020-04-01 ENCOUNTER — Other Ambulatory Visit: Payer: Self-pay

## 2020-04-01 DIAGNOSIS — Z1502 Genetic susceptibility to malignant neoplasm of ovary: Secondary | ICD-10-CM | POA: Diagnosis not present

## 2020-04-01 DIAGNOSIS — Z1501 Genetic susceptibility to malignant neoplasm of breast: Secondary | ICD-10-CM | POA: Diagnosis not present

## 2020-04-01 DIAGNOSIS — Z1509 Genetic susceptibility to other malignant neoplasm: Secondary | ICD-10-CM | POA: Diagnosis not present

## 2020-04-01 NOTE — Progress Notes (Signed)
PATIENT ID: Monica Hancock, female     DOB: [birth year], 39 y.o.     MRN: [id number]  Preoperative History and Physical  Monica Hancock is a 39 y.o. [id number] here for surgical management of BRCA 1 gene positivity. She has completed breast removal bilateral, and desires to proceed with abdominal supracervical hysterectomy, with bilateral salpingo oophorectomy   No significant preoperative concerns. Her last pap smear was 01/08/2020, and was normal.   She notes that she is currently working part time.   Proposed surgery: abdominal  supracervical hysterectomy with bilateral salpingectomy on 04/14/2020 plans are to remove an ellipse of redundant skin of the pannus as a part of pelvic access. The patient states she has no plans to ever proceed with TRAM flap , so desires this approach for improved personal care  Past Medical History:  Diagnosis Date  . BRCA1 positive   . Cancer [facility])    Right Breast  . Family history of adverse reaction to anesthesia    PONV-children  . Family history of BRCA1 gene positive   . Family history of breast cancer   . H/O Bell's palsy 2000   Left sided  . HSV infection    Past Surgical History:  Procedure Laterality Date  . BREAST BIOPSY Right   . IR IMAGING GUIDED PORT INSERTION  08/26/2019  . MASTECTOMY MODIFIED RADICAL Right 01/24/2020   Procedure: RIGHT MASTECTOMY MODIFIED RADICAL;  Surgeon: Aviva Signs, MD;  Location: [facility];  Service: General;  Laterality: Right;  . SIMPLE MASTECTOMY WITH AXILLARY SENTINEL NODE BIOPSY Left 01/24/2020   Procedure: LEFT SIMPLE MASTECTOMY;  Surgeon: Aviva Signs, MD;  Location: [facility];  Service: General;  Laterality: Left;  . TUBAL LIGATION     OB History  Gravida Para Term Preterm AB Living  5 5 4 1  0 5  SAB TAB Ectopic Multiple Live Births  0 0 0 0 5    # Outcome Date GA Lbr Len/2nd Weight Sex Delivery Anes PTL Lv  5 Term 03/10/15 [redacted]w[redacted]d 8 lb 6 oz (3.799 kg) M Vag-Spont EPI N LIV  4 Term 04/12/12 [id number] /  01:07 6 lb 13.6 oz (3.107 kg) F Vag-Spont EPI N LIV  3 Term 2009 [id number]:00 7 lb 8 oz (3.402 kg) M Vag-Spont  N LIV  2 Term 2007 [id number] 7 lb 6 oz (3.345 kg) F Vag-Spont  N LIV  1 Preterm 1993 [id number] 5 lb 4 oz (2.381 kg) M Vag-Spont  Y LIV  Patient denies any other pertinent gynecologic issues.   Current Outpatient Medications on File Prior to Visit  Medication Sig Dispense Refill  . anastrozole (ARIMIDEX) 1 MG tablet Take 1 tablet (1 mg total) by mouth daily. 30 tablet 6  . cyclobenzaprine (FLEXERIL) 10 MG tablet 1/2 po qhs (Patient not taking: Reported on 03/26/2020) 30 tablet 0  . diphenhydrAMINE (BENADRYL) 50 MG tablet Take 50 mg by mouth every 8 (eight) hours as needed for allergies.     . ferrous sulfate (FERROUSUL) 325 (65 FE) MG tablet Take 1 tablet (325 mg total) by mouth 3 (three) times daily with meals. 90 tablet 1  . lidocaine-prilocaine (EMLA) cream Apply 1 application topically See admin instructions. Apply small amount to port a cath and cover with plastic wrap 1 hour prior to chemotherapy    . loratadine (CLARITIN) 10 MG tablet Take 10 mg by mouth daily as needed for allergies.     . magnesium oxide (MAG-OX) 400 (241.3  Mg) MG tablet Take 1 tablet (400 mg total) by mouth 3 (three) times daily. (Patient taking differently: Take 400 mg by mouth 2 (two) times daily. ) 90 tablet 1  . oxyCODONE-acetaminophen (PERCOCET) 7.5-325 MG tablet Take 1 tablet by mouth every 6 (six) hours as needed for severe pain. (Patient not taking: Reported on 03/26/2020) 25 tablet 0  . pertuzumab in sodium chloride 0.9 % 250 mL Inject into the vein every 21 ( twenty-one) days.    . potassium chloride SA (KLOR-CON) 10 MEQ tablet Take 2 tablets (20 mEq total) by mouth 2 (two) times daily. 120 tablet 3  . prochlorperazine (COMPAZINE) 10 MG tablet Take 1 tablet (10 mg total) by mouth every 6 (six) hours as needed for nausea or vomiting. 30 tablet 3  . trastuzumab-dkst 2 mg/kg in sodium chloride 0.9 % 250 mL  Inject into the vein every 21 ( twenty-one) days.     No current facility-administered medications on file prior to visit.   No Known Allergies  Social History:   reports that she has never smoked. She has never used smokeless tobacco. She reports that she does not drink alcohol or use drugs.  Family History  Problem Relation Age of Onset  . Diabetes Mother   . Breast cancer Mother 54  . Cancer Mother   . Diabetes Maternal Grandmother   . Stomach cancer Maternal Grandfather   . Breast cancer Maternal Aunt        dx in her 72s  . Diabetes Maternal Aunt     Review of Systems: Noncontributory  PHYSICAL EXAM: There were no vitals taken for this visit. General appearance - alert, well appearing, and in no distress Chest - clear to auscultation, no wheezes, rales or rhonchi, symmetric air entry Heart - normal rate and regular rhythm Abdomen - soft, nontender, nondistended, no masses or organomegaly                     Lax abdominal wall with skin and fat redundancy.  Pelvic - examination deferred Extremities - peripheral pulses normal, no pedal edema, no clubbing or cyanosis  Labs: Results for orders placed or performed in visit on 03/26/20 (from the past 336 hour(s))  CBC with Differential/Platelet   Collection Time: 03/26/20 10:04 AM  Result Value Ref Range   WBC 5.5 4.0 - 10.5 K/uL   RBC 4.63 3.87 - 5.11 MIL/uL   Hemoglobin 13.4 12.0 - 15.0 g/dL   HCT 41.0 36.0 - 46.0 %   MCV 88.6 80.0 - 100.0 fL   MCH 28.9 26.0 - 34.0 pg   MCHC 32.7 30.0 - 36.0 g/dL   RDW 13.2 11.5 - 15.5 %   Platelets 288 150 - 400 K/uL   nRBC 0.0 0.0 - 0.2 %   Neutrophils Relative % 44 %   Neutro Abs 2.4 1.7 - 7.7 K/uL   Lymphocytes Relative 48 %   Lymphs Abs 2.6 0.7 - 4.0 K/uL   Monocytes Relative 6 %   Monocytes Absolute 0.3 0.1 - 1.0 K/uL   Eosinophils Relative 2 %   Eosinophils Absolute 0.1 0.0 - 0.5 K/uL   Basophils Relative 0 %   Basophils Absolute 0.0 0.0 - 0.1 K/uL   Immature  Granulocytes 0 %   Abs Immature Granulocytes 0.02 0.00 - 0.07 K/uL  Comprehensive metabolic panel   Collection Time: 03/26/20 10:04 AM  Result Value Ref Range   Sodium 137 135 - 145 mmol/L   Potassium 3.8 3.5 -  5.1 mmol/L   Chloride 103 98 - 111 mmol/L   CO2 25 22 - 32 mmol/L   Glucose, Bld 103 (H) 70 - 99 mg/dL   BUN 13 6 - 20 mg/dL   Creatinine, Ser 0.50 0.44 - 1.00 mg/dL   Calcium 10.0 8.9 - 10.3 mg/dL   Total Protein 7.7 6.5 - 8.1 g/dL   Albumin 4.1 3.5 - 5.0 g/dL   AST 19 15 - 41 U/L   ALT 29 0 - 44 U/L   Alkaline Phosphatase 80 38 - 126 U/L   Total Bilirubin 0.6 0.3 - 1.2 mg/dL   GFR calc non Af Amer >60 >60 mL/min   GFR calc Af Amer >60 >60 mL/min   Anion gap 9 5 - 15  Magnesium   Collection Time: 03/26/20 10:04 AM  Result Value Ref Range   Magnesium 1.8 1.7 - 2.4 mg/dL    Imaging Studies: No results found.  Assessment: Patient Active Problem List   Diagnosis Date Noted  . S/P bilateral mastectomy 01/24/2020  . Acute pain of left shoulder 01/01/2020  . Chest wall pain 01/01/2020  . Elevated alkaline phosphatase level 11/14/2019  . Elevated glucose 11/14/2019  . Dermatitis 11/13/2019  . Diarrhea 11/13/2019  . Nausea 11/13/2019  . Loss of appetite 11/13/2019  . Malignant neoplasm of upper-outer quadrant of right female breast ([place]) 08/07/2019  . Screening breast examination 08/01/2019  . Breast lump on right side at 10 o'clock position 08/01/2019  . Breast lump on left side at 3 o'clock position 08/01/2019  . 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 Genetic testing 06/07/2016  . Family history of breast cancer   . Family history of BRCA1 gene positive   . UTI (lower urinary tract infection) 04/21/2015    Plan: Patient will undergo surgical management with abdominal supracervical hysterectomy, bilateral salping oophorectomy, thru a transverse lower abdominal incision, with excsion of redundant skin to improve pelvic access for  surgery and improved wound care postop..   By signing my name below, I, [employer], attest that this documentation has been prepared under the direction and in the presence of [name], MD. Electronically Signed: [place]. 04/01/20. 12:01 AM.  I personally performed the services described in this documentation, which was SCRIBED in my presence. The recorded information has been reviewed and considered accurate. It has been edited as necessary during review. [name], MD

## 2020-04-06 NOTE — Patient Instructions (Addendum)
Monica Hancock  04/06/2020     @PREFPERIOPPHARMACY @   Your procedure is scheduled on  04/14/2020  Report to [facility] at  [email or web address].  Call this number if you have problems the morning of surgery:  [phone]   Remember:  Do not eat or drink after midnight.                      Take these medicines the morning of surgery with A SIP OF WATER  Compazine(if needed).    Do not wear jewelry, make-up or nail polish.  Do not wear lotions, powders, or perfume. Please wear deodorant and brush your teeth.  Do not shave 48 hours prior to surgery.  Men may shave face and neck.  Do not bring valuables to the hospital.  [facility] is not responsible for any belongings or valuables.  Contacts, dentures or bridgework may not be worn into surgery.  Leave your suitcase in the car.  After surgery it may be brought to your room.  For patients admitted to the hospital, discharge time will be determined by your treatment team.  Patients discharged the day of surgery will not be allowed to drive home.   Name and phone number of your driver:   family Special instructions:  DO NOT smoke the day of your procedure.  Please read over the following fact sheets that you were given. Anesthesia Post-op Instructions and Care and Recovery After Surgery       Bilateral Salpingo-Oophorectomy, Care After This sheet gives you information about how to care for yourself after your procedure. Your health care provider may also give you more specific instructions. If you have problems or questions, contact your health care provider. What can I expect after the procedure? After the procedure, it is common to have:  Abdominal pain.  Some occasional vaginal bleeding (spotting).  Tiredness.  Symptoms of menopause, such as hot flashes, night sweats, or mood swings. Follow these instructions at home: Incision care   Keep your incision area and your bandage (dressing) clean and  dry.  Follow instructions from your health care provider about how to take care of your incision. Make sure you: ? Wash your hands with soap and water before you change your dressing. If soap and water are not available, use hand sanitizer. ? Change your dressing as told by your health care provider. ? Leave stitches (sutures), staples, skin glue, or adhesive strips in place. These skin closures may need to stay in place for 2 weeks or longer. If adhesive strip edges start to loosen and curl up, you may trim the loose edges. Do not remove adhesive strips completely unless your health care provider tells you to do that.  Check your incision area every day for signs of infection. Check for: ? Redness, swelling, or pain. ? Fluid or blood. ? Warmth. ? Pus or a bad smell. Activity   Do not drive or use heavy machinery while taking prescription pain medicine.  Do not drive for 24 hours if you received a medicine to help you relax (sedative) during your procedure.  Take frequent, short walks throughout the day. Rest when you get tired. Ask your health care provider what activities are safe for you.  Avoid activity that requires great effort. Also, avoid heavy lifting. Do not lift anything that is heavier than 10 lbs. (4.5 kg), or the limit that your health care provider  tells you, until he or she says that it is safe to do so.  Do not douche, use tampons, or have sex until your health care provider approves. General instructions   To prevent or treat constipation while you are taking prescription pain medicine, your health care provider may recommend that you: ? Drink enough fluid to keep your urine clear or pale yellow. ? Take over-the-counter or prescription medicines. ? Eat foods that are high in fiber, such as fresh fruits and vegetables, whole grains, and beans. ? Limit foods that are high in fat and processed sugars, such as fried and sweet foods.  Take over-the-counter and  prescription medicines only as told by your health care provider.  Do not take baths, swim, or use a hot tub until your health care provider approves. Ask your health care provider if you can take showers. You may only be allowed to take sponge baths for bathing.  Wear compression stockings as told by your health care provider. These stockings help to prevent blood clots and reduce swelling in your legs.  Keep all follow-up visits as told by your health care provider. This is important. Contact a health care provider if:  You have pain when you urinate.  You have pus or a bad smelling discharge coming from your vagina.  You have redness, swelling, or pain around your incision.  You have fluid or blood coming from your incision.  Your incision feels warm to the touch.  You have pus or a bad smell coming from your incision.  You have a fever.  Your incision starts to break open.  You have pain in the abdomen, and it gets worse or does not get better when you take medicine.  You develop a rash.  You develop nausea and vomiting.  You feel lightheaded. Get help right away if:  You develop pain in your chest or leg.  You become short of breath.  You faint.  You have increased bleeding from your vagina. Summary  After the procedure, it is common to have pain, bleeding in the vagina, and symptoms of menopause.  Follow instructions from your health care provider about how to take care of your incision.  Follow instructions from your health care provider about activities and restrictions.  Check your incision every day for signs of infection and report any symptoms to your health care provider. This information is not intended to replace advice given to you by your health care provider. Make sure you discuss any questions you have with your health care provider. Document Revised: 01/11/2019 Document Reviewed: 12/12/2016 Elsevier Patient Education  [place].  Abdominal Hysterectomy, Care After This sheet gives you information about how to care for yourself after your procedure. Your doctor may also give you more specific instructions. If you have problems or questions, contact your doctor. Follow these instructions at home: Bathing  Do not take baths, swim, or use a hot tub until your doctor says it is okay. Ask your doctor if you can take showers. You may only be allowed to take sponge baths for bathing.  Keep the bandage (dressing) dry until your doctor says it can be taken off. Surgical cut (incision) care      Follow instructions from your doctor about how to take care of your cut from surgery. Make sure you: ? Wash your hands with soap and water before you change your bandage (dressing). If you cannot use soap and water, use hand sanitizer. ? Change your  bandage as told by your doctor. ? Leave stitches (sutures), skin glue, or skin tape (adhesive) strips in place. They may need to stay in place for 2 weeks or longer. If tape strips get loose and curl up, you may trim the loose edges. Do not remove tape strips completely unless your doctor says it is okay.  Check your surgical cut area every day for signs of infection. Check for: ? Redness, swelling, or pain. ? Fluid or blood. ? Warmth. ? Pus or a bad smell. Activity  Do gentle, daily exercise as told by your doctor. You may be told to take short walks every day and go farther each time.  Do not lift anything that is heavier than 10 lb (4.5 kg), or the limit that your doctor tells you, until he or she says that it is safe.  Do not drive or use heavy machinery while taking prescription pain medicine.  Do not drive for 24 hours if you were given a medicine to help you relax (sedative).  Follow your doctor's advice about exercise, driving, and general activities. Ask your doctor what activities are safe for you. Lifestyle   Do not douche, use tampons, or have sex for at least  6 weeks or as told by your doctor.  Do not drink alcohol until your doctor says it is okay.  Drink enough fluid to keep your pee (urine) clear or pale yellow.  Try to have someone at home with you for the first 1-2 weeks to help.  Do not use any products that contain nicotine or tobacco, such as cigarettes and e-cigarettes. These can slow down healing. If you need help quitting, ask your doctor. General instructions  Take over-the-counter and prescription medicines only as told by your doctor.  Do not take aspirin or ibuprofen. These medicines can cause bleeding.  To prevent or treat constipation while you are taking prescription pain medicine, your doctor may suggest that you: ? Drink enough fluid to keep your urine clear or pale yellow. ? Take over-the-counter or prescription medicines. ? Eat foods that are high in fiber, such as:  Fresh fruits and vegetables.  Whole grains.  Beans. ? Limit foods that are high in fat and processed sugars, such as fried and sweet foods.  Keep all follow-up visits as told by your doctor. This is important. Contact a doctor if:  You have chills or fever.  You have redness, swelling, or pain around your cut.  You have fluid or blood coming from your cut.  Your cut feels warm to the touch.  You have pus or a bad smell coming from your cut.  Your cut breaks open.  You feel dizzy or light-headed.  You have pain or bleeding when you pee.  You keep having watery poop (diarrhea).  You keep feeling sick to your stomach (nauseous) or keep throwing up (vomiting).  You have unusual fluid (discharge) coming from your vagina.  You have a rash.  You have a reaction to your medicine.  Your pain medicine does not help. Get help right away if:  You have a fever and your symptoms get worse all of a sudden.  You have very bad belly (abdominal) pain.  You are short of breath.  You pass out (faint).  You have pain, swelling, or redness of  your leg.  You bleed a lot from your vagina and notice clumps of blood (clots). Summary  Do not take baths, swim, or use a hot tub until your  doctor says it is okay. Ask your doctor if you can take showers. You may only be allowed to take sponge baths for bathing.  Follow your doctor's advice about exercise, driving, and general activities. Ask your doctor what activities are safe for you.  Do not lift anything that is heavier than 10 lb (4.5 kg), or the limit that your doctor tells you, until he or she says that it is safe.  Try to have someone at home with you for the first 1-2 weeks to help. This information is not intended to replace advice given to you by your health care provider. Make sure you discuss any questions you have with your health care provider. Document Revised: 01/10/2019 Document Reviewed: 10/26/2016 Elsevier Patient Education  2020 [place] Anesthesia, Adult, Care After This sheet gives you information about how to care for yourself after your procedure. Your health care provider may also give you more specific instructions. If you have problems or questions, contact your health care provider. What can I expect after the procedure? After the procedure, the following side effects are common:  Pain or discomfort at the IV site.  Nausea.  Vomiting.  Sore throat.  Trouble concentrating.  Feeling cold or chills.  Weak or tired.  Sleepiness and fatigue.  Soreness and body aches. These side effects can affect parts of the body that were not involved in surgery. Follow these instructions at home:  For at least 24 hours after the procedure:  Have a responsible adult stay with you. It is important to have someone help care for you until you are awake and alert.  Rest as needed.  Do not: ? Participate in activities in which you could fall or become injured. ? Drive. ? Use heavy machinery. ? Drink alcohol. ? Take sleeping pills or medicines  that cause drowsiness. ? Make important decisions or sign legal documents. ? Take care of children on your own. Eating and drinking  Follow any instructions from your health care provider about eating or drinking restrictions.  When you feel hungry, start by eating small amounts of foods that are soft and easy to digest (bland), such as toast. Gradually return to your regular diet.  Drink enough fluid to keep your urine pale yellow.  If you vomit, rehydrate by drinking water, juice, or clear broth. General instructions  If you have sleep apnea, surgery and certain medicines can increase your risk for breathing problems. Follow instructions from your health care provider about wearing your sleep device: ? Anytime you are sleeping, including during daytime naps. ? While taking prescription pain medicines, sleeping medicines, or medicines that make you drowsy.  Return to your normal activities as told by your health care provider. Ask your health care provider what activities are safe for you.  Take over-the-counter and prescription medicines only as told by your health care provider.  If you smoke, do not smoke without supervision.  Keep all follow-up visits as told by your health care provider. This is important. Contact a health care provider if:  You have nausea or vomiting that does not get better with medicine.  You cannot eat or drink without vomiting.  You have pain that does not get better with medicine.  You are unable to pass urine.  You develop a skin rash.  You have a fever.  You have redness around your IV site that gets worse. Get help right away if:  You have difficulty breathing.  You have chest pain.  You have blood in your urine or stool, or you vomit blood. Summary  After the procedure, it is common to have a sore throat or nausea. It is also common to feel tired.  Have a responsible adult stay with you for the first 24 hours after general  anesthesia. It is important to have someone help care for you until you are awake and alert.  When you feel hungry, start by eating small amounts of foods that are soft and easy to digest (bland), such as toast. Gradually return to your regular diet.  Drink enough fluid to keep your urine pale yellow.  Return to your normal activities as told by your health care provider. Ask your health care provider what activities are safe for you. This information is not intended to replace advice given to you by your health care provider. Make sure you discuss any questions you have with your health care provider. Document Revised: 11/10/2017 Document Reviewed: 06/23/2017 Elsevier Patient Education  [place]. How to Use Chlorhexidine for Bathing Chlorhexidine gluconate (CHG) is a germ-killing (antiseptic) solution that is used to clean the skin. It can get rid of the bacteria that normally live on the skin and can keep them away for about 24 hours. To clean your skin with CHG, you may be given:  A CHG solution to use in the shower or as part of a sponge bath.  A prepackaged cloth that contains CHG. Cleaning your skin with CHG may help lower the risk for infection:  While you are staying in the intensive care unit of the hospital.  If you have a vascular access, such as a central line, to provide short-term or long-term access to your veins.  If you have a catheter to drain urine from your bladder.  If you are on a ventilator. A ventilator is a machine that helps you breathe by moving air in and out of your lungs.  After surgery. What are the risks? Risks of using CHG include:  A skin reaction.  Hearing loss, if CHG gets in your ears.  Eye injury, if CHG gets in your eyes and is not rinsed out.  The CHG product catching fire. Make sure that you avoid smoking and flames after applying CHG to your skin. Do not use CHG:  If you have a chlorhexidine allergy or have previously reacted  to chlorhexidine.  On babies younger than 42 months of age. How to use CHG solution  Use CHG only as told by your health care provider, and follow the instructions on the label.  Use the full amount of CHG as directed. Usually, this is one bottle. During a shower Follow these steps when using CHG solution during a shower (unless your health care provider gives you different instructions): 1. Start the shower. 2. Use your normal soap and shampoo to wash your face and hair. 3. Turn off the shower or move out of the shower stream. 4. Pour the CHG onto a clean washcloth. Do not use any type of brush or rough-edged sponge. 5. Starting at your neck, lather your body down to your toes. Make sure you follow these instructions: ? If you will be having surgery, pay special attention to the part of your body where you will be having surgery. Scrub this area for at least 1 minute. ? Do not use CHG on your head or face. If the solution gets into your ears or eyes, rinse them well with water. ? Avoid your genital area. ?  Avoid any areas of skin that have broken skin, cuts, or scrapes. ? Scrub your back and under your arms. Make sure to wash skin folds. 6. Let the lather sit on your skin for 1-2 minutes or as long as told by your health care provider. 7. Thoroughly rinse your entire body in the shower. Make sure that all body creases and crevices are rinsed well. 8. Dry off with a clean towel. Do not put any substances on your body afterward--such as powder, lotion, or perfume--unless you are told to do so by your health care provider. Only use lotions that are recommended by the manufacturer. 9. Put on clean clothes or pajamas. 10. If it is the night before your surgery, sleep in clean sheets.  During a sponge bath Follow these steps when using CHG solution during a sponge bath (unless your health care provider gives you different instructions): 1. Use your normal soap and shampoo to wash your face and  hair. 2. Pour the CHG onto a clean washcloth. 3. Starting at your neck, lather your body down to your toes. Make sure you follow these instructions: ? If you will be having surgery, pay special attention to the part of your body where you will be having surgery. Scrub this area for at least 1 minute. ? Do not use CHG on your head or face. If the solution gets into your ears or eyes, rinse them well with water. ? Avoid your genital area. ? Avoid any areas of skin that have broken skin, cuts, or scrapes. ? Scrub your back and under your arms. Make sure to wash skin folds. 4. Let the lather sit on your skin for 1-2 minutes or as long as told by your health care provider. 5. Using a different clean, wet washcloth, thoroughly rinse your entire body. Make sure that all body creases and crevices are rinsed well. 6. Dry off with a clean towel. Do not put any substances on your body afterward--such as powder, lotion, or perfume--unless you are told to do so by your health care provider. Only use lotions that are recommended by the manufacturer. 7. Put on clean clothes or pajamas. 8. If it is the night before your surgery, sleep in clean sheets. How to use CHG prepackaged cloths  Only use CHG cloths as told by your health care provider, and follow the instructions on the label.  Use the CHG cloth on clean, dry skin.  Do not use the CHG cloth on your head or face unless your health care provider tells you to.  When washing with the CHG cloth: ? Avoid your genital area. ? Avoid any areas of skin that have broken skin, cuts, or scrapes. Before surgery Follow these steps when using a CHG cloth to clean before surgery (unless your health care provider gives you different instructions): 1. Using the CHG cloth, vigorously scrub the part of your body where you will be having surgery. Scrub using a back-and-forth motion for 3 minutes. The area on your body should be completely wet with CHG when you are done  scrubbing. 2. Do not rinse. Discard the cloth and let the area air-dry. Do not put any substances on the area afterward, such as powder, lotion, or perfume. 3. Put on clean clothes or pajamas. 4. If it is the night before your surgery, sleep in clean sheets.  For general bathing Follow these steps when using CHG cloths for general bathing (unless your health care provider gives you different instructions). 1.  Use a separate CHG cloth for each area of your body. Make sure you wash between any folds of skin and between your fingers and toes. Wash your body in the following order, switching to a new cloth after each step: ? The front of your neck, shoulders, and chest. ? Both of your arms, under your arms, and your hands. ? Your stomach and groin area, avoiding the genitals. ? Your right leg and foot. ? Your left leg and foot. ? The back of your neck, your back, and your buttocks. 2. Do not rinse. Discard the cloth and let the area air-dry. Do not put any substances on your body afterward--such as powder, lotion, or perfume--unless you are told to do so by your health care provider. Only use lotions that are recommended by the manufacturer. 3. Put on clean clothes or pajamas. Contact a health care provider if:  Your skin gets irritated after scrubbing.  You have questions about using your solution or cloth. Get help right away if:  Your eyes become very red or swollen.  Your eyes itch badly.  Your skin itches badly and is red or swollen.  Your hearing changes.  You have trouble seeing.  You have swelling or tingling in your mouth or throat.  You have trouble breathing.  You swallow any chlorhexidine. Summary  Chlorhexidine gluconate (CHG) is a germ-killing (antiseptic) solution that is used to clean the skin. Cleaning your skin with CHG may help to lower your risk for infection.  You may be given CHG to use for bathing. It may be in a bottle or in a prepackaged cloth to use on  your skin. Carefully follow your health care provider's instructions and the instructions on the product label.  Do not use CHG if you have a chlorhexidine allergy.  Contact your health care provider if your skin gets irritated after scrubbing. This information is not intended to replace advice given to you by your health care provider. Make sure you discuss any questions you have with your health care provider. Document Revised: 01/24/2019 Document Reviewed: 10/05/2017 Elsevier Patient Education  [place].

## 2020-04-08 ENCOUNTER — Encounter (HOSPITAL_COMMUNITY): Payer: Self-pay

## 2020-04-08 ENCOUNTER — Ambulatory Visit (HOSPITAL_COMMUNITY)
Admission: RE | Admit: 2020-04-08 | Discharge: 2020-04-08 | Disposition: A | Discharge status: Home / Self Care | Payer: Medicaid Other | Source: Ambulatory Visit | Attending: Hematology | Admitting: Hematology

## 2020-04-08 ENCOUNTER — Other Ambulatory Visit: Payer: Self-pay

## 2020-04-08 ENCOUNTER — Encounter (HOSPITAL_COMMUNITY)
Admission: RE | Admit: 2020-04-08 | Discharge: 2020-04-08 | Disposition: A | Discharge status: Home / Self Care | Payer: Medicaid Other | Source: Ambulatory Visit | Attending: Obstetrics and Gynecology | Admitting: Obstetrics and Gynecology

## 2020-04-08 DIAGNOSIS — Z170 Estrogen receptor positive status [ER+]: Secondary | ICD-10-CM

## 2020-04-08 DIAGNOSIS — C50411 Malignant neoplasm of upper-outer quadrant of right female breast: Secondary | ICD-10-CM | POA: Insufficient documentation

## 2020-04-08 DIAGNOSIS — Z0189 Encounter for other specified special examinations: Secondary | ICD-10-CM | POA: Diagnosis not present

## 2020-04-08 DIAGNOSIS — Z01812 Encounter for preprocedural laboratory examination: Secondary | ICD-10-CM | POA: Insufficient documentation

## 2020-04-08 LAB — URINALYSIS, ROUTINE W REFLEX MICROSCOPIC
Bilirubin Urine: NEGATIVE
Glucose, UA: NEGATIVE mg/dL
Hgb urine dipstick: NEGATIVE
Ketones, ur: NEGATIVE mg/dL
Leukocytes,Ua: NEGATIVE
Nitrite: NEGATIVE
Protein, ur: NEGATIVE mg/dL
Specific Gravity, Urine: 1.006 (ref 1.005–1.030)
pH: 6 (ref 5.0–8.0)

## 2020-04-08 LAB — COMPREHENSIVE METABOLIC PANEL
ALT: 29 U/L (ref 0–44)
AST: 18 U/L (ref 15–41)
Albumin: 4 g/dL (ref 3.5–5.0)
Alkaline Phosphatase: 86 U/L (ref 38–126)
Anion gap: 8 (ref 5–15)
BUN: 17 mg/dL (ref 6–20)
CO2: 25 mmol/L (ref 22–32)
Calcium: 9.7 mg/dL (ref 8.9–10.3)
Chloride: 104 mmol/L (ref 98–111)
Creatinine, Ser: 0.6 mg/dL (ref 0.44–1.00)
GFR calc Af Amer: >60 mL/min (ref >60)
GFR calc non Af Amer: >60 mL/min (ref >60)
Glucose, Bld: 95 mg/dL (ref 70–99)
Potassium: 3.5 mmol/L (ref 3.5–5.1)
Sodium: 137 mmol/L (ref 135–145)
Total Bilirubin: 0.8 mg/dL (ref 0.3–1.2)
Total Protein: 7.6 g/dL (ref 6.5–8.1)

## 2020-04-08 LAB — CBC
HCT: 43 % (ref 36.0–46.0)
Hemoglobin: 14.1 g/dL (ref 12.0–15.0)
MCH: 29 pg (ref 26.0–34.0)
MCHC: 32.8 g/dL (ref 30.0–36.0)
MCV: 88.5 fL (ref 80.0–100.0)
Platelets: 335 10*3/uL (ref 150–400)
RBC: 4.86 MIL/uL (ref 3.87–5.11)
RDW: 13.2 % (ref 11.5–15.5)
WBC: 7.9 10*3/uL (ref 4.0–10.5)
nRBC: 0 % (ref 0.0–0.2)

## 2020-04-08 LAB — TYPE AND SCREEN
ABO/RH(D): O NEG
Antibody Screen: NEGATIVE

## 2020-04-08 LAB — HCG, SERUM, QUALITATIVE: Preg, Serum: NEGATIVE

## 2020-04-08 LAB — ECHOCARDIOGRAM COMPLETE
Height: 56 in
Weight: 2368 oz

## 2020-04-08 NOTE — Progress Notes (Signed)
*  PRELIMINARY RESULTS* Echocardiogram 2D Echocardiogram has been performed.  Monica Hancock 04/08/2020, 3:56 PM

## 2020-04-08 NOTE — Progress Notes (Signed)
Pt is coming in for Covid test 04/13/20.

## 2020-04-13 ENCOUNTER — Other Ambulatory Visit (HOSPITAL_COMMUNITY)
Admission: RE | Admit: 2020-04-13 | Discharge: 2020-04-13 | Disposition: A | Discharge status: Home / Self Care | Payer: Medicaid Other | Source: Ambulatory Visit | Attending: Obstetrics and Gynecology | Admitting: Obstetrics and Gynecology

## 2020-04-13 ENCOUNTER — Other Ambulatory Visit: Payer: Self-pay

## 2020-04-13 LAB — SARS CORONAVIRUS 2 (TAT 6-24 HRS): SARS Coronavirus 2: NEGATIVE

## 2020-04-14 ENCOUNTER — Encounter (HOSPITAL_COMMUNITY): Payer: Self-pay | Admitting: Obstetrics and Gynecology

## 2020-04-14 ENCOUNTER — Inpatient Hospital Stay (HOSPITAL_COMMUNITY): Payer: Medicaid Other | Admitting: Anesthesiology

## 2020-04-14 ENCOUNTER — Other Ambulatory Visit: Payer: Self-pay

## 2020-04-14 ENCOUNTER — Encounter (HOSPITAL_COMMUNITY)
Admission: RE | Disposition: A | Discharge status: Home / Self Care | Payer: Self-pay | Source: Home / Self Care | Attending: Obstetrics and Gynecology

## 2020-04-14 ENCOUNTER — Inpatient Hospital Stay (HOSPITAL_COMMUNITY)
Admission: RE | Admit: 2020-04-14 | Discharge: 2020-04-16 | DRG: 743 | Disposition: A | Discharge status: Home / Self Care | Payer: Medicaid Other | Attending: Obstetrics and Gynecology | Admitting: Obstetrics and Gynecology

## 2020-04-14 DIAGNOSIS — Z800 Family history of malignant neoplasm of digestive organs: Secondary | ICD-10-CM | POA: Diagnosis not present

## 2020-04-14 DIAGNOSIS — Z1501 Genetic susceptibility to malignant neoplasm of breast: Secondary | ICD-10-CM | POA: Diagnosis present

## 2020-04-14 DIAGNOSIS — Z20822 Contact with and (suspected) exposure to covid-19: Secondary | ICD-10-CM | POA: Diagnosis present

## 2020-04-14 DIAGNOSIS — Z803 Family history of malignant neoplasm of breast: Secondary | ICD-10-CM | POA: Diagnosis not present

## 2020-04-14 DIAGNOSIS — Z9221 Personal history of antineoplastic chemotherapy: Secondary | ICD-10-CM

## 2020-04-14 DIAGNOSIS — Z90710 Acquired absence of both cervix and uterus: Secondary | ICD-10-CM | POA: Diagnosis present

## 2020-04-14 DIAGNOSIS — Z1509 Genetic susceptibility to other malignant neoplasm: Secondary | ICD-10-CM | POA: Diagnosis present

## 2020-04-14 DIAGNOSIS — Z833 Family history of diabetes mellitus: Secondary | ICD-10-CM | POA: Diagnosis not present

## 2020-04-14 DIAGNOSIS — Z4002 Encounter for prophylactic removal of ovary: Principal | ICD-10-CM | POA: Diagnosis present

## 2020-04-14 DIAGNOSIS — Z853 Personal history of malignant neoplasm of breast: Secondary | ICD-10-CM

## 2020-04-14 DIAGNOSIS — Z923 Personal history of irradiation: Secondary | ICD-10-CM | POA: Diagnosis not present

## 2020-04-14 HISTORY — PX: SALPINGOOPHORECTOMY: SHX82

## 2020-04-14 HISTORY — PX: SUPRACERVICAL ABDOMINAL HYSTERECTOMY: SHX5393

## 2020-04-14 SURGERY — HYSTERECTOMY, SUPRACERVICAL, ABDOMINAL
Anesthesia: General | Site: Abdomen

## 2020-04-14 MED ORDER — HYDROMORPHONE HCL 1 MG/ML IJ SOLN
0.2500 mg | INTRAMUSCULAR | Status: DC | PRN
  Administered 2020-04-14 (×3): 0.5 mg via INTRAVENOUS
  Filled 2020-04-14 (×3): qty 0.5

## 2020-04-14 MED ORDER — CEFAZOLIN SODIUM-DEXTROSE 2-4 GM/100ML-% IV SOLN
INTRAVENOUS | Status: AC
  Filled 2020-04-14: qty 100

## 2020-04-14 MED ORDER — PROPOFOL 10 MG/ML IV BOLUS
INTRAVENOUS | Status: DC | PRN
  Administered 2020-04-14: 200 mg via INTRAVENOUS

## 2020-04-14 MED ORDER — FENTANYL CITRATE (PF) 100 MCG/2ML IJ SOLN
INTRAMUSCULAR | Status: DC | PRN
  Administered 2020-04-14 (×2): 100 ug via INTRAVENOUS
  Administered 2020-04-14: 50 ug via INTRAVENOUS

## 2020-04-14 MED ORDER — FENTANYL CITRATE (PF) 250 MCG/5ML IJ SOLN
INTRAMUSCULAR | Status: AC
  Filled 2020-04-14: qty 5

## 2020-04-14 MED ORDER — ORAL CARE MOUTH RINSE
15.0000 mL | Freq: Two times a day (BID) | OROMUCOSAL | Status: DC
  Administered 2020-04-14 – 2020-04-15 (×3): 15 mL via OROMUCOSAL

## 2020-04-14 MED ORDER — CEFAZOLIN SODIUM-DEXTROSE 2-4 GM/100ML-% IV SOLN
2.0000 g | INTRAVENOUS | Status: AC
  Administered 2020-04-14: 2 g via INTRAVENOUS

## 2020-04-14 MED ORDER — PROMETHAZINE HCL 25 MG/ML IJ SOLN: 6.2500 mg | INTRAMUSCULAR | Status: DC | PRN

## 2020-04-14 MED ORDER — ONDANSETRON HCL 4 MG PO TABS: 4.0000 mg | ORAL_TABLET | Freq: Four times a day (QID) | ORAL | Status: DC | PRN

## 2020-04-14 MED ORDER — PROPOFOL 10 MG/ML IV BOLUS
INTRAVENOUS | Status: AC
  Filled 2020-04-14: qty 20

## 2020-04-14 MED ORDER — SCOPOLAMINE 1 MG/3DAYS TD PT72
1.0000 | MEDICATED_PATCH | Freq: Once | TRANSDERMAL | Status: DC
  Administered 2020-04-14: 1.5 mg via TRANSDERMAL

## 2020-04-14 MED ORDER — 0.9 % SODIUM CHLORIDE (POUR BTL) OPTIME
TOPICAL | Status: DC | PRN
  Administered 2020-04-14: 1000 mL

## 2020-04-14 MED ORDER — MIDAZOLAM HCL 2 MG/2ML IJ SOLN
INTRAMUSCULAR | Status: AC
  Filled 2020-04-14: qty 2

## 2020-04-14 MED ORDER — BUPIVACAINE LIPOSOME 1.3 % IJ SUSP
INTRAMUSCULAR | Status: DC | PRN
  Administered 2020-04-14: 20 mL

## 2020-04-14 MED ORDER — SODIUM CHLORIDE 0.9% FLUSH: 9.0000 mL | INTRAVENOUS | Status: DC | PRN

## 2020-04-14 MED ORDER — LIDOCAINE 2% (20 MG/ML) 5 ML SYRINGE
INTRAMUSCULAR | Status: AC
  Filled 2020-04-14: qty 5

## 2020-04-14 MED ORDER — ROCURONIUM BROMIDE 100 MG/10ML IV SOLN
INTRAVENOUS | Status: DC | PRN
  Administered 2020-04-14: 50 mg via INTRAVENOUS

## 2020-04-14 MED ORDER — GABAPENTIN 300 MG PO CAPS
ORAL_CAPSULE | ORAL | Status: AC
  Filled 2020-04-14: qty 1

## 2020-04-14 MED ORDER — ONDANSETRON HCL 4 MG/2ML IJ SOLN
INTRAMUSCULAR | Status: AC
  Filled 2020-04-14: qty 2

## 2020-04-14 MED ORDER — DEXTROSE-NACL 5-0.45 % IV SOLN: INTRAVENOUS | Status: DC

## 2020-04-14 MED ORDER — ORAL CARE MOUTH RINSE: 15.0000 mL | Freq: Once | OROMUCOSAL | Status: AC

## 2020-04-14 MED ORDER — MEPERIDINE HCL 50 MG/ML IJ SOLN: 6.2500 mg | INTRAMUSCULAR | Status: DC | PRN

## 2020-04-14 MED ORDER — LIDOCAINE HCL (CARDIAC) PF 100 MG/5ML IV SOSY
PREFILLED_SYRINGE | INTRAVENOUS | Status: DC | PRN
  Administered 2020-04-14: 100 mg via INTRAVENOUS

## 2020-04-14 MED ORDER — ACETAMINOPHEN 500 MG PO TABS
1000.0000 mg | ORAL_TABLET | Freq: Four times a day (QID) | ORAL | Status: DC
  Administered 2020-04-14 – 2020-04-16 (×7): 1000 mg via ORAL
  Filled 2020-04-14 (×7): qty 2

## 2020-04-14 MED ORDER — ROCURONIUM BROMIDE 10 MG/ML (PF) SYRINGE
PREFILLED_SYRINGE | INTRAVENOUS | Status: AC
  Filled 2020-04-14: qty 10

## 2020-04-14 MED ORDER — LACTATED RINGERS IV SOLN
Freq: Once | INTRAVENOUS | Status: AC
  Administered 2020-04-14: 1000 mL via INTRAVENOUS

## 2020-04-14 MED ORDER — NALOXONE HCL 0.4 MG/ML IJ SOLN: 0.4000 mg | INTRAMUSCULAR | Status: DC | PRN

## 2020-04-14 MED ORDER — ENOXAPARIN SODIUM 40 MG/0.4ML ~~LOC~~ SOLN
40.0000 mg | SUBCUTANEOUS | Status: DC
  Administered 2020-04-15 – 2020-04-16 (×2): 40 mg via SUBCUTANEOUS
  Filled 2020-04-14 (×2): qty 0.4

## 2020-04-14 MED ORDER — ONDANSETRON HCL 4 MG/2ML IJ SOLN
4.0000 mg | Freq: Four times a day (QID) | INTRAMUSCULAR | Status: DC | PRN
  Administered 2020-04-14: 4 mg via INTRAVENOUS

## 2020-04-14 MED ORDER — PROPOFOL 500 MG/50ML IV EMUL
INTRAVENOUS | Status: DC | PRN
Start: 2020-04-14 — End: 2020-04-14
  Administered 2020-04-14: 25 ug/kg/min via INTRAVENOUS

## 2020-04-14 MED ORDER — HYDROMORPHONE 1 MG/ML IV SOLN
INTRAVENOUS | Status: DC
  Administered 2020-04-14: 1 mg via INTRAVENOUS
  Administered 2020-04-15: 1.5 mg via INTRAVENOUS
  Filled 2020-04-14: qty 30

## 2020-04-14 MED ORDER — ONDANSETRON HCL 4 MG/2ML IJ SOLN
4.0000 mg | Freq: Four times a day (QID) | INTRAMUSCULAR | Status: DC | PRN
  Filled 2020-04-14: qty 2

## 2020-04-14 MED ORDER — DEXAMETHASONE SODIUM PHOSPHATE 10 MG/ML IJ SOLN
INTRAMUSCULAR | Status: DC | PRN
  Administered 2020-04-14: 10 mg via INTRAVENOUS

## 2020-04-14 MED ORDER — DIPHENHYDRAMINE HCL 50 MG/ML IJ SOLN: 12.5000 mg | Freq: Four times a day (QID) | INTRAMUSCULAR | Status: DC | PRN

## 2020-04-14 MED ORDER — DIPHENHYDRAMINE HCL 12.5 MG/5ML PO ELIX: 12.5000 mg | ORAL_SOLUTION | Freq: Four times a day (QID) | ORAL | Status: DC | PRN

## 2020-04-14 MED ORDER — LACTATED RINGERS IV SOLN: INTRAVENOUS | Status: DC | PRN

## 2020-04-14 MED ORDER — ACETAMINOPHEN 500 MG PO TABS
1000.0000 mg | ORAL_TABLET | Freq: Once | ORAL | Status: AC
  Administered 2020-04-14: 1000 mg via ORAL

## 2020-04-14 MED ORDER — DEXAMETHASONE SODIUM PHOSPHATE 10 MG/ML IJ SOLN
INTRAMUSCULAR | Status: AC
  Filled 2020-04-14: qty 1

## 2020-04-14 MED ORDER — MIDAZOLAM HCL 2 MG/2ML IJ SOLN
2.0000 mg | Freq: Once | INTRAMUSCULAR | Status: AC
  Administered 2020-04-14: 2 mg via INTRAVENOUS

## 2020-04-14 MED ORDER — BUPIVACAINE LIPOSOME 1.3 % IJ SUSP
INTRAMUSCULAR | Status: AC
  Filled 2020-04-14: qty 20

## 2020-04-14 MED ORDER — ONDANSETRON HCL 4 MG/2ML IJ SOLN
INTRAMUSCULAR | Status: DC | PRN
  Administered 2020-04-14: 4 mg via INTRAVENOUS

## 2020-04-14 MED ORDER — SUGAMMADEX SODIUM 500 MG/5ML IV SOLN
INTRAVENOUS | Status: DC | PRN
  Administered 2020-04-14: 200 mg via INTRAVENOUS

## 2020-04-14 MED ORDER — SCOPOLAMINE 1 MG/3DAYS TD PT72
MEDICATED_PATCH | TRANSDERMAL | Status: AC
  Filled 2020-04-14: qty 1

## 2020-04-14 MED ORDER — GABAPENTIN 400 MG PO CAPS
400.0000 mg | ORAL_CAPSULE | Freq: Once | ORAL | Status: AC | PRN
  Administered 2020-04-14: 400 mg via ORAL
  Filled 2020-04-14 (×2): qty 1

## 2020-04-14 MED ORDER — CHLORHEXIDINE GLUCONATE 0.12 % MT SOLN
15.0000 mL | Freq: Once | OROMUCOSAL | Status: AC
  Administered 2020-04-14: 15 mL via OROMUCOSAL

## 2020-04-14 MED ORDER — KETOROLAC TROMETHAMINE 30 MG/ML IJ SOLN
30.0000 mg | Freq: Once | INTRAMUSCULAR | Status: AC
  Administered 2020-04-14: 30 mg via INTRAVENOUS
  Filled 2020-04-14: qty 1

## 2020-04-14 MED ORDER — PANTOPRAZOLE SODIUM 40 MG PO TBEC
40.0000 mg | DELAYED_RELEASE_TABLET | Freq: Every day | ORAL | Status: DC
  Administered 2020-04-15: 40 mg via ORAL
  Filled 2020-04-14: qty 1

## 2020-04-14 MED ORDER — ACETAMINOPHEN 500 MG PO TABS
ORAL_TABLET | ORAL | Status: AC
  Filled 2020-04-14: qty 2

## 2020-04-14 MED ORDER — OXYCODONE HCL 5 MG PO TABS
5.0000 mg | ORAL_TABLET | ORAL | Status: DC | PRN
  Administered 2020-04-15 (×2): 10 mg via ORAL
  Filled 2020-04-14 (×2): qty 2

## 2020-04-14 SURGICAL SUPPLY — 54 items
APL SKNCLS STERI-STRIP NONHPOA (GAUZE/BANDAGES/DRESSINGS) ×2
BENZOIN TINCTURE PRP APPL 2/3 (GAUZE/BANDAGES/DRESSINGS) ×4 IMPLANT
BLADE SURG SZ10 CARB STEEL (BLADE) ×4 IMPLANT
CELLS DAT CNTRL 66122 CELL SVR (MISCELLANEOUS) ×2 IMPLANT
CLOSURE WOUND 1/2 X4 (GAUZE/BANDAGES/DRESSINGS) ×4
CLOTH BEACON ORANGE TIMEOUT ST (SAFETY) ×4 IMPLANT
COVER LIGHT HANDLE STERIS (MISCELLANEOUS) ×8 IMPLANT
COVER WAND RF STERILE (DRAPES) ×4 IMPLANT
DRAPE WARM FLUID 44X44 (DRAPES) ×4 IMPLANT
DRSG OPSITE POSTOP 4X10 (GAUZE/BANDAGES/DRESSINGS) ×4 IMPLANT
DRSG OPSITE POSTOP 4X8 (GAUZE/BANDAGES/DRESSINGS) ×2 IMPLANT
DURAPREP 26ML APPLICATOR (WOUND CARE) ×4 IMPLANT
ELECT REM PT RETURN 9FT ADLT (ELECTROSURGICAL) ×4
ELECTRODE REM PT RTRN 9FT ADLT (ELECTROSURGICAL) ×2 IMPLANT
GAUZE SPONGE 4X4 16PLY XRAY LF (GAUZE/BANDAGES/DRESSINGS) ×4 IMPLANT
GLOVE BIOGEL PI IND STRL 7.0 (GLOVE) ×6 IMPLANT
GLOVE BIOGEL PI IND STRL 9 (GLOVE) ×2 IMPLANT
GLOVE BIOGEL PI INDICATOR 7.0 (GLOVE) ×6
GLOVE BIOGEL PI INDICATOR 9 (GLOVE) ×2
GLOVE ECLIPSE 6.5 STRL STRAW (GLOVE) ×4 IMPLANT
GLOVE ECLIPSE 9.0 STRL (GLOVE) ×4 IMPLANT
GOWN SPEC L3 XXLG W/TWL (GOWN DISPOSABLE) ×8 IMPLANT
GOWN STRL REUS W/TWL LRG LVL3 (GOWN DISPOSABLE) ×8 IMPLANT
INST SET MAJOR GENERAL (KITS) ×4 IMPLANT
KIT TURNOVER KIT A (KITS) ×4 IMPLANT
MANIFOLD NEPTUNE II (INSTRUMENTS) ×4 IMPLANT
NDL HYPO 18GX1.5 BLUNT FILL (NEEDLE) ×2 IMPLANT
NEEDLE HYPO 18GX1.5 BLUNT FILL (NEEDLE) ×4 IMPLANT
NEEDLE HYPO 22GX1.5 SAFETY (NEEDLE) ×4 IMPLANT
NS IRRIG 1000ML POUR BTL (IV SOLUTION) ×8 IMPLANT
PACK MAJOR ABDOMINAL (CUSTOM PROCEDURE TRAY) ×4 IMPLANT
PAD ARMBOARD 7.5X6 YLW CONV (MISCELLANEOUS) ×4 IMPLANT
PENCIL SMOKE EVACUATOR (MISCELLANEOUS) ×6 IMPLANT
RETRACTOR WND ALEXIS 18 MED (MISCELLANEOUS) IMPLANT
RETRACTOR WND ALEXIS 25 LRG (MISCELLANEOUS) IMPLANT
RTRCTR WOUND ALEXIS 18CM MED (MISCELLANEOUS) ×4
RTRCTR WOUND ALEXIS 25CM LRG (MISCELLANEOUS)
SET BASIN LINEN APH (SET/KITS/TRAYS/PACK) ×4 IMPLANT
SPONGE LAP 18X18 RF (DISPOSABLE) ×2 IMPLANT
STRIP CLOSURE SKIN 1/2X4 (GAUZE/BANDAGES/DRESSINGS) ×8 IMPLANT
SUT CHROMIC 0 CT 1 (SUTURE) ×32 IMPLANT
SUT CHROMIC 2 0 CT 1 (SUTURE) ×10 IMPLANT
SUT ETHILON 3 0 FSL (SUTURE) IMPLANT
SUT PDS AB CT VIOLET #0 27IN (SUTURE) IMPLANT
SUT PLAIN CT 1/2CIR 2-0 27IN (SUTURE) ×8 IMPLANT
SUT PROLENE 0 CT 1 30 (SUTURE) IMPLANT
SUT VIC AB 0 CT1 27 (SUTURE) ×4
SUT VIC AB 0 CT1 27XBRD ANTBC (SUTURE) ×2 IMPLANT
SUT VIC AB 0 CT1 27XCR 8 STRN (SUTURE) IMPLANT
SUT VICRYL 4 0 KS 27 (SUTURE) ×4 IMPLANT
SYR 20ML LL LF (SYRINGE) ×8 IMPLANT
SYR BULB IRRIG 60ML STRL (SYRINGE) ×4 IMPLANT
TOWEL SURG RFD BLUE STRL DISP (DISPOSABLE) ×4 IMPLANT
TRAY FOLEY MTR SLVR 16FR STAT (SET/KITS/TRAYS/PACK) ×4 IMPLANT

## 2020-04-14 NOTE — Anesthesia Preprocedure Evaluation (Signed)
Anesthesia Evaluation  Patient identified by MRN, date of birth, ID band Patient awake    Reviewed: Allergy & Precautions, NPO status , Patient's Chart, lab work & pertinent test results  History of Anesthesia Complications (+) Family history of anesthesia reaction and history of anesthetic complications  Airway Mallampati: II  TM Distance: >3 FB Neck ROM: Full    Dental no notable dental hx. (+) Teeth Intact   Pulmonary neg pulmonary ROS,    Pulmonary exam normal breath sounds clear to auscultation       Cardiovascular Exercise Tolerance: Good negative cardio ROS   Rhythm:Regular Rate:Normal  1. Left ventricular ejection fraction, by estimation, is 60 to 65%. The  left ventricle has normal function. The left ventricle has no regional  wall motion abnormalities. Left ventricular diastolic parameters were  normal.  2. Right ventricular systolic function is normal. The right ventricular  size is normal. There is normal pulmonary artery systolic pressure.  3. The mitral valve is normal in structure. No evidence of mitral valve  regurgitation. No evidence of mitral stenosis.  4. The aortic valve is tricuspid. Aortic valve regurgitation is not  visualized. No aortic stenosis is present.  5. The inferior vena cava is normal in size with greater than 50%  respiratory variability, suggesting right atrial pressure of 3 mmHg.    Neuro/Psych negative neurological ROS  negative psych ROS   GI/Hepatic negative GI ROS, Neg liver ROS,   Endo/Other  negative endocrine ROS  Renal/GU negative Renal ROS  negative genitourinary   Musculoskeletal negative musculoskeletal ROS (+)   Abdominal   Peds  Hematology negative hematology ROS (+)   Anesthesia Other Findings Right breast cancer, s/p chemo/radiation  Reproductive/Obstetrics negative OB ROS                            Anesthesia  Physical  Anesthesia Plan  ASA: III  Anesthesia Plan: General   Post-op Pain Management:    Induction: Intravenous  PONV Risk Score and Plan: 4 or greater and Ondansetron, Dexamethasone, Midazolam and Scopolamine patch - Pre-op  Airway Management Planned: Oral ETT  Additional Equipment:   Intra-op Plan:   Post-operative Plan: Extubation in OR  Informed Consent: I have reviewed the patients History and Physical, chart, labs and discussed the procedure including the risks, benefits and alternatives for the proposed anesthesia with the patient or authorized representative who has indicated his/her understanding and acceptance.       Plan Discussed with: CRNA and Surgeon  Anesthesia Plan Comments:         Anesthesia Quick Evaluation

## 2020-04-14 NOTE — Brief Op Note (Signed)
04/14/2020  11:36 AM  PATIENT:  Monica Hancock  39 y.o. 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  PROCEDURE:  Procedure(s): HYSTERECTOMY SUPRACERVICAL ABDOMINAL (N/A) OPEN BILATERAL SALPINGO OOPHORECTOMY (Bilateral)  SURGEON:  Surgeon(s) and Role:    [name], MD - Primary  PHYSICIAN ASSISTANT:   ASSISTANTS: Corrie Dandy, RNFA  ANESTHESIA:   general and paracervical block,  EBL:  50 mL   BLOOD ADMINISTERED:none  DRAINS: Urinary Catheter (Foley)   LOCAL MEDICATIONS USED:  OTHER Exparel, 20 cc  SPECIMEN:  Source of Specimen:  Uterus tubes and ovaries  DISPOSITION OF SPECIMEN:  PATHOLOGY  COUNTS:  YES  TOURNIQUET:  * No tourniquets in log *  DICTATION: .Dragon Dictation  PLAN OF CARE: Admit to inpatient   PATIENT DISPOSITION:  PACU - hemodynamically stable.   Delay start of Pharmacological VTE agent (>24hrs) due to surgical blood loss or risk of bleeding: not applicable Details of procedure patient was taken the operating room prepped and draped with Foley catheter in place.  Timeout was conducted and confirmed by surgical team.  Ancef was administered.  The transverse lower abdominal incision was performed excising a 20 cm x 5 cm ellipse of skin and subcutaneous fat to allow for better positioning and care of the postoperative wound.  Transverse incision was made through the fascia and peritoneal cavity entered bluntly in the midline.  The omentum was inspected and was grossly normal on all surfaces.  Bowel was packed away.  The uterus was small mobile anteverted tubes and ovaries were grossly normal with evidence of prior tubal ligation.  There was a small omental adhesion to the right tubal ligation clip that was easily transected.  Pelvic peritoneal surfaces were smooth. Round ligament on the left was taken down, doubly ligated and  transected, and the left infundibulopelvic ligament isolated just lateral to the left ovary, being careful to confirm that the ureter was well out of the way.  Ureter could be visualized in the retroperitoneum.  Clamping across the IP ligament was performed, with transection and ligation performed with adequate hemostasis.  The bladder flap was skeletonized anterior and pushed away from the lower uterine segment.  The left uterine vessel complex could be then clamped with a curved Heaney clamp and a Kelly clamp for backbleeding transected and suture ligated with 0 chromic.  The right side was treated in similar fashion.  A small straight Heaney clamp was placed on each side taking the dissection down a little further leaving the cardinal ligaments intact on each side.  The bowel was then predicted by placing malleable retractor in position.  The lower uterine segment was scored off of the cervix leaving approximately a 2-1/2 cm stop of the cervix.  The intraperitoneal end of the stump was then oversewn closing the cervical cuff front to back with 3 interrupted sutures.  Hemostasis was satisfactory.  Exparel x5 cc was injected around the cervix to assist with postoperative pain.  Some injection into the uterosacrals and just anterior to the lower cardinal ligaments was performed.  Pelvis was irrigated and all pedicles inspected.  The bladder flap edge was reapproximated over the cuff leave a smooth pelvic floor hemostasis was good pelvis was irrigated and confirmed is satisfactorily hemostatic.  EBL was 50 cc.  Pelvis returned to its anatomic position the removal of all the packing.  Peritoneum was closed anteriorly.  The sponge  and needle counts were correct and confirmed with the radiographic wand.  Fascia had been closed with 0 Vicryl.  Subcutaneous tissues were irrigated, reapproximated with 2 layers of horizontal mattress sutures pulling the skin edges into good approximation whereupon Granger needle and a 4-0  Vicryl suture were used to reapproximate skin edges.  Steri-Strips with benzoin followed and then a honeycomb dressing.  Patient recovery room in stable condition.

## 2020-04-14 NOTE — Anesthesia Procedure Notes (Signed)
Procedure Name: Intubation Date/Time: 04/14/2020 10:00 AM Performed by: Jonna Munro, CRNA Pre-anesthesia Checklist: Patient identified, Emergency Drugs available, Suction available, Patient being monitored and Timeout performed Patient Re-evaluated:Patient Re-evaluated prior to induction Oxygen Delivery Method: Circle system utilized Preoxygenation: Pre-oxygenation with 100% oxygen Induction Type: IV induction Ventilation: Mask ventilation without difficulty Laryngoscope Size: Glidescope and 3 Grade View: Grade I Tube type: Oral Tube size: 7.0 mm Number of attempts: 2 Airway Equipment and Method: Stylet and Video-laryngoscopy Placement Confirmation: ETT inserted through vocal cords under direct vision,  positive ETCO2 and breath sounds checked- equal and bilateral Secured at: 22 cm Tube secured with: Tape Dental Injury: Teeth and Oropharynx as per pre-operative assessment  Comments: DL by CRNA with MAC 3, unable to see cords. DL by Dr. Charna Elizabeth with glidescope MAC 3, easy, atraumatic intubation.

## 2020-04-14 NOTE — Interval H&P Note (Signed)
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  The various methods of treatment have been discussed with the patient and family. After consideration of risks, benefits and other options for treatment, the patient has consented to  Procedure(s): HYSTERECTOMY SUPRACERVICAL ABDOMINAL (N/A) OPEN BILATERAL SALPINGO OOPHORECTOMY (Bilateral) as a surgical intervention.  The patient's history has been reviewed, patient examined, no change in status, stable for surgery.  I have reviewed the patient's chart and labs.  Questions were answered to the patient's satisfaction.     [name]

## 2020-04-14 NOTE — Op Note (Signed)
04/14/2020  11:36 AM  PATIENT:  Monica Hancock  39 y.o. 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  PROCEDURE:  Procedure(s): HYSTERECTOMY SUPRACERVICAL ABDOMINAL (N/A) OPEN BILATERAL SALPINGO OOPHORECTOMY (Bilateral)  SURGEON:  Surgeon(s) and Role:    [name], MD - Primary  PHYSICIAN ASSISTANT:   ASSISTANTS: Corrie Dandy, RNFA  ANESTHESIA:   general and paracervical block,  EBL:  50 mL   BLOOD ADMINISTERED:none  DRAINS: Urinary Catheter (Foley)   LOCAL MEDICATIONS USED:  OTHER Exparel, 20 cc  SPECIMEN:  Source of Specimen:  Uterus tubes and ovaries  DISPOSITION OF SPECIMEN:  PATHOLOGY  COUNTS:  YES  TOURNIQUET:  * No tourniquets in log *  DICTATION: .Dragon Dictation  PLAN OF CARE: Admit to inpatient   PATIENT DISPOSITION:  PACU - hemodynamically stable.   Delay start of Pharmacological VTE agent (>24hrs) due to surgical blood loss or risk of bleeding: not applicable Details of procedure patient was taken the operating room prepped and draped with Foley catheter in place.  Timeout was conducted and confirmed by surgical team.  Ancef was administered.  The transverse lower abdominal incision was performed excising a 20 cm x 5 cm ellipse of skin and subcutaneous fat to allow for better positioning and care of the postoperative wound.  Transverse incision was made through the fascia and peritoneal cavity entered bluntly in the midline.  The omentum was inspected and was grossly normal on all surfaces.  Bowel was packed away.  The uterus was small mobile anteverted tubes and ovaries were grossly normal with evidence of prior tubal ligation.  There was a small omental adhesion to the right tubal ligation clip that was easily transected.  Pelvic peritoneal surfaces were smooth. Round ligament on the left was taken down, doubly ligated and  transected, and the left infundibulopelvic ligament isolated just lateral to the left ovary, being careful to confirm that the ureter was well out of the way.  Ureter could be visualized in the retroperitoneum.  Clamping across the IP ligament was performed, with transection and ligation performed with adequate hemostasis.  The bladder flap was skeletonized anterior and pushed away from the lower uterine segment.  The left uterine vessel complex could be then clamped with a curved Heaney clamp and a Kelly clamp for backbleeding transected and suture ligated with 0 chromic.  The right side was treated in similar fashion.  A small straight Heaney clamp was placed on each side taking the dissection down a little further leaving the cardinal ligaments intact on each side.  The bowel was then predicted by placing malleable retractor in position.  The lower uterine segment was scored off of the cervix leaving approximately a 2-1/2 cm stop of the cervix.  The intraperitoneal end of the stump was then oversewn closing the cervical cuff front to back with 3 interrupted sutures.  Hemostasis was satisfactory.  Exparel x5 cc was injected around the cervix to assist with postoperative pain.  Some injection into the uterosacrals and just anterior to the lower cardinal ligaments was performed.  Pelvis was irrigated and all pedicles inspected.  The bladder flap edge was reapproximated over the cuff leave a smooth pelvic floor hemostasis was good pelvis was irrigated and confirmed is satisfactorily hemostatic.  EBL was 50 cc.  Pelvis returned to its anatomic position the removal of all the packing.  Peritoneum was closed anteriorly.  The sponge  and needle counts were correct and confirmed with the radiographic wand.  Fascia had been closed with 0 Vicryl.  Subcutaneous tissues were irrigated, reapproximated with 2 layers of horizontal mattress sutures pulling the skin edges into good approximation whereupon Mabie needle and a 4-0  Vicryl suture were used to reapproximate skin edges.  Steri-Strips with benzoin follow honeycomb dressing applied.

## 2020-04-14 NOTE — H&P (Signed)
Monica Hancock is a 39 y.o. [phone] here for surgical management of BRCA 1 gene positivity. She has completed breast removal bilateral, and desires to proceed with abdominal supracervical hysterectomy, with bilateral salpingo oophorectomy No significant preoperative concerns. Her last pap smear was 01/08/2020, and was normal.  She notes that she is currently working part time.  Proposed surgery: abdominal supracervical hysterectomy with bilateral salpingectomy on 04/14/2020 plans are to remove an ellipse of redundant skin of the pannus as a part of pelvic access. The patient states she has no plans to ever proceed with TRAM flap , so desires this approach for improved personal care      Past Medical History:  Diagnosis Date  . BRCA1 positive   . Cancer [facility])    Right Breast  . Family history of adverse reaction to anesthesia    PONV-children  . Family history of BRCA1 gene positive   . Family history of breast cancer   . H/O Bell's palsy 2000   Left sided  . HSV infection         Past Surgical History:  Procedure Laterality Date  . BREAST BIOPSY Right   . IR IMAGING GUIDED PORT INSERTION  08/26/2019  . MASTECTOMY MODIFIED RADICAL Right 01/24/2020   Procedure: RIGHT MASTECTOMY MODIFIED RADICAL; Surgeon: Aviva Signs, MD; Location: [facility]; Service: General; Laterality: Right;  . SIMPLE MASTECTOMY WITH AXILLARY SENTINEL NODE BIOPSY Left 01/24/2020   Procedure: LEFT SIMPLE MASTECTOMY; Surgeon: Aviva Signs, MD; Location: [facility]; Service: General; Laterality: Left;  . TUBAL LIGATION                     OB History  Gravida Para Term Preterm AB Living  5 5 4 1  0 5  SAB TAB Ectopic Multiple Live Births     0 0 0 0 5       # Outcome Date GA Lbr Len/2nd Weight Sex Delivery Anes PTL Lv  5 Term 03/10/15 [redacted]w[redacted]d 8 lb 6 oz (3.799 kg) M Vag-Spont EPI N LIV  4 Term 04/12/12 [id number] / 01:07 6 lb 13.6 oz (3.107 kg) F Vag-Spont EPI N LIV  3 Term 2009 [id number] 7 lb 8 oz (3.402 kg) M  Vag-Spont  N LIV  2 Term 2007 [id number] 7 lb 6 oz (3.345 kg) F Vag-Spont  N LIV  1 Preterm 1993 [id number] 5 lb 4 oz (2.381 kg) M Vag-Spont  Y LIV  Patient denies any other pertinent gynecologic issues.        Current Outpatient Medications on File Prior to Visit  Medication Sig Dispense Refill  . anastrozole (ARIMIDEX) 1 MG tablet Take 1 tablet (1 mg total) by mouth daily. 30 tablet 6  . cyclobenzaprine (FLEXERIL) 10 MG tablet 1/2 po qhs (Patient not taking: Reported on 03/26/2020) 30 tablet 0  . diphenhydrAMINE (BENADRYL) 50 MG tablet Take 50 mg by mouth every 8 (eight) hours as needed for allergies.     . ferrous sulfate (FERROUSUL) 325 (65 FE) MG tablet Take 1 tablet (325 mg total) by mouth 3 (three) times daily with meals. 90 tablet 1  . lidocaine-prilocaine (EMLA) cream Apply 1 application topically See admin instructions. Apply small amount to port a cath and cover with plastic wrap 1 hour prior to chemotherapy    . loratadine (CLARITIN) 10 MG tablet Take 10 mg by mouth daily as needed for allergies.     . magnesium oxide (MAG-OX) 400 (241.3 Mg) MG tablet  Take 1 tablet (400 mg total) by mouth 3 (three) times daily. (Patient taking differently: Take 400 mg by mouth 2 (two) times daily. ) 90 tablet 1  . oxyCODONE-acetaminophen (PERCOCET) 7.5-325 MG tablet Take 1 tablet by mouth every 6 (six) hours as needed for severe pain. (Patient not taking: Reported on 03/26/2020) 25 tablet 0  . pertuzumab in sodium chloride 0.9 % 250 mL Inject into the vein every 21 ( twenty-one) days.    . potassium chloride SA (KLOR-CON) 10 MEQ tablet Take 2 tablets (20 mEq total) by mouth 2 (two) times daily. 120 tablet 3  . prochlorperazine (COMPAZINE) 10 MG tablet Take 1 tablet (10 mg total) by mouth every 6 (six) hours as needed for nausea or vomiting. 30 tablet 3  . trastuzumab-dkst 2 mg/kg in sodium chloride 0.9 % 250 mL Inject into the vein every 21 ( twenty-one) days.     No current facility-administered  medications on file prior to visit.   No Known Allergies  Social History: reports that she has never smoked. She has never used smokeless tobacco. She reports that she does not drink alcohol or use drugs.       Family History  Problem Relation Age of Onset  . Diabetes Mother   . Breast cancer Mother 96  . Cancer Mother   . Diabetes Maternal Grandmother   . Stomach cancer Maternal Grandfather   . Breast cancer Maternal Aunt    dx in her 44s  . Diabetes Maternal Aunt    Review of Systems: Noncontributory  PHYSICAL EXAM:  There were no vitals taken for this visit.  General appearance - alert, well appearing, and in no distress  Chest - clear to auscultation, no wheezes, rales or rhonchi, symmetric air entry  Heart - normal rate and regular rhythm  Abdomen - soft, nontender, nondistended, no masses or organomegaly  Lax abdominal wall with skin and fat redundancy.  Pelvic - examination deferred  Extremities - peripheral pulses normal, no pedal edema, no clubbing or cyanosis  Labs:       Results for orders placed or performed in visit on 03/26/20 (from the past 336 hour(s))  CBC with Differential/Platelet   Collection Time: 03/26/20 10:04 AM  Result Value Ref Range   WBC 5.5 4.0 - 10.5 K/uL   RBC 4.63 3.87 - 5.11 MIL/uL   Hemoglobin 13.4 12.0 - 15.0 g/dL   HCT 41.0 36.0 - 46.0 %   MCV 88.6 80.0 - 100.0 fL   MCH 28.9 26.0 - 34.0 pg   MCHC 32.7 30.0 - 36.0 g/dL   RDW 13.2 11.5 - 15.5 %   Platelets 288 150 - 400 K/uL   nRBC 0.0 0.0 - 0.2 %   Neutrophils Relative % 44 %   Neutro Abs 2.4 1.7 - 7.7 K/uL   Lymphocytes Relative 48 %   Lymphs Abs 2.6 0.7 - 4.0 K/uL   Monocytes Relative 6 %   Monocytes Absolute 0.3 0.1 - 1.0 K/uL   Eosinophils Relative 2 %   Eosinophils Absolute 0.1 0.0 - 0.5 K/uL   Basophils Relative 0 %   Basophils Absolute 0.0 0.0 - 0.1 K/uL   Immature Granulocytes 0 %   Abs Immature Granulocytes 0.02 0.00 - 0.07 K/uL  Comprehensive metabolic panel    Collection Time: 03/26/20 10:04 AM  Result Value Ref Range   Sodium 137 135 - 145 mmol/L   Potassium 3.8 3.5 - 5.1 mmol/L   Chloride 103 98 - 111 mmol/L  CO2 25 22 - 32 mmol/L   Glucose, Bld 103 (H) 70 - 99 mg/dL   BUN 13 6 - 20 mg/dL   Creatinine, Ser 0.50 0.44 - 1.00 mg/dL   Calcium 10.0 8.9 - 10.3 mg/dL   Total Protein 7.7 6.5 - 8.1 g/dL   Albumin 4.1 3.5 - 5.0 g/dL   AST 19 15 - 41 U/L   ALT 29 0 - 44 U/L   Alkaline Phosphatase 80 38 - 126 U/L   Total Bilirubin 0.6 0.3 - 1.2 mg/dL   GFR calc non Af Amer >60 >60 mL/min   GFR calc Af Amer >60 >60 mL/min   Anion gap 9 5 - 15  Magnesium   Collection Time: 03/26/20 10:04 AM  Result Value Ref Range   Magnesium 1.8 1.7 - 2.4 mg/dL   CBC Latest Ref Rng & Units 04/08/2020 03/26/2020 03/05/2020  WBC 4.0 - 10.5 K/uL 7.9 5.5 4.7  Hemoglobin 12.0 - 15.0 g/dL 14.1 13.4 12.7  Hematocrit 36.0 - 46.0 % 43.0 41.0 39.7  Platelets 150 - 400 K/uL 335 288 285    CMP Latest Ref Rng & Units 04/08/2020 03/26/2020 03/05/2020  Glucose 70 - 99 mg/dL 95 103(H) 122(H)  BUN 6 - 20 mg/dL 17 13 17   Creatinine 0.44 - 1.00 mg/dL 0.60 0.50 0.61  Sodium 135 - 145 mmol/L 137 137 138  Potassium 3.5 - 5.1 mmol/L 3.5 3.8 3.8  Chloride 98 - 111 mmol/L 104 103 103  CO2 22 - 32 mmol/L 25 25 25   Calcium 8.9 - 10.3 mg/dL 9.7 10.0 10.2  Total Protein 6.5 - 8.1 g/dL 7.6 7.7 7.4  Total Bilirubin 0.3 - 1.2 mg/dL 0.8 0.6 0.6  Alkaline Phos 38 - 126 U/L 86 80 76  AST 15 - 41 U/L 18 19 20   ALT 0 - 44 U/L 29 29 26     Imaging Studies:  Imaging Results    Assessment:      Patient Active Problem List   Diagnosis Date Noted  . S/P bilateral mastectomy 01/24/2020  . Acute pain of left shoulder 01/01/2020  . Chest wall pain 01/01/2020  . Elevated alkaline phosphatase level 11/14/2019  . Elevated glucose 11/14/2019  . Dermatitis 11/13/2019  . Diarrhea 11/13/2019  . Nausea 11/13/2019  . Loss of appetite 11/13/2019  . Malignant neoplasm of upper-outer quadrant of right  female breast ([place]) 08/07/2019  . Screening breast examination 08/01/2019  . Breast lump on right side at 10 o'clock position 08/01/2019  . Breast lump on left side at 3 o'clock position 08/01/2019  . 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 Genetic testing 06/07/2016  . Family history of breast cancer   . Family history of BRCA1 gene positive   . UTI (lower urinary tract infection) 04/21/2015   Plan:  Patient will undergo surgical management with abdominal supracervical hysterectomy, bilateral salping oophorectomy, thru a transverse lower abdominal incision, with excsion of redundant skin to improve pelvic access for surgery and improved wound care postop..  By signing my name below, I, [employer], attest that this documentation has been prepared under the direction and in the presence of [name], MD.  Electronically Signed: [place]. 04/01/20. 12:01 AM.  I personally performed the services described in this documentation, which was SCRIBED in my presence. The recorded information has been reviewed and considered accurate. It has been edited as necessary during review.  [name], MD

## 2020-04-14 NOTE — Anesthesia Postprocedure Evaluation (Signed)
Anesthesia Post Note  Patient: Monica Hancock  Procedure(s) Performed: HYSTERECTOMY SUPRACERVICAL ABDOMINAL (N/A Abdomen) OPEN BILATERAL SALPINGO OOPHORECTOMY (Bilateral Abdomen)  Patient location during evaluation: PACU Anesthesia Type: General Level of consciousness: awake, oriented and patient cooperative Pain management: satisfactory to patient Vital Signs Assessment: post-procedure vital signs reviewed and stable Respiratory status: spontaneous breathing, respiratory function stable, nonlabored ventilation and patient connected to face mask oxygen Cardiovascular status: stable Postop Assessment: no apparent nausea or vomiting Anesthetic complications: no     Last Vitals:  Vitals:   04/14/20 0945 04/14/20 1130  BP:  118/74  Pulse: 73 78  Resp: 13 17  Temp:  36.5 C  SpO2: 98% 100%    Last Pain:  Vitals:   04/14/20 1130  TempSrc:   PainSc: Asleep                 GREGORY,SUZANNE

## 2020-04-14 NOTE — Transfer of Care (Signed)
Immediate Anesthesia Transfer of Care Note  Patient: Monica Hancock  Procedure(s) Performed: HYSTERECTOMY SUPRACERVICAL ABDOMINAL (N/A Abdomen) OPEN BILATERAL SALPINGO OOPHORECTOMY (Bilateral Abdomen)  Patient Location: PACU  Anesthesia Type:General  Level of Consciousness: awake, oriented, drowsy and patient cooperative  Airway & Oxygen Therapy: Patient Spontanous Breathing and Patient connected to face mask oxygen  Post-op Assessment: Report given to RN, Post -op Vital signs reviewed and stable and Patient moving all extremities X 4  Post vital signs: Reviewed and stable  Last Vitals:  Vitals Value Taken Time  BP 118/74 04/14/20 1130  Temp    Pulse 74 04/14/20 1135  Resp 16 04/14/20 1135  SpO2 100 % 04/14/20 1135  Vitals shown include unvalidated device data.  Last Pain:  Vitals:   04/14/20 0914  TempSrc: Oral  PainSc: 0-No pain      Patients Stated Pain Goal: 8 ([id number])  Complications: No apparent anesthesia complications

## 2020-04-15 LAB — BASIC METABOLIC PANEL
Anion gap: 10 (ref 5–15)
BUN: 9 mg/dL (ref 6–20)
CO2: 25 mmol/L (ref 22–32)
Calcium: 9.8 mg/dL (ref 8.9–10.3)
Chloride: 101 mmol/L (ref 98–111)
Creatinine, Ser: 0.57 mg/dL (ref 0.44–1.00)
GFR calc Af Amer: >60 mL/min (ref >60)
GFR calc non Af Amer: >60 mL/min (ref >60)
Glucose, Bld: 142 mg/dL — ABNORMAL HIGH (ref 70–99)
Potassium: 4.4 mmol/L (ref 3.5–5.1)
Sodium: 136 mmol/L (ref 135–145)

## 2020-04-15 LAB — CBC
HCT: 37.4 % (ref 36.0–46.0)
Hemoglobin: 12.1 g/dL (ref 12.0–15.0)
MCH: 29.1 pg (ref 26.0–34.0)
MCHC: 32.4 g/dL (ref 30.0–36.0)
MCV: 89.9 fL (ref 80.0–100.0)
Platelets: 263 10*3/uL (ref 150–400)
RBC: 4.16 MIL/uL (ref 3.87–5.11)
RDW: 13.2 % (ref 11.5–15.5)
WBC: 10.2 10*3/uL (ref 4.0–10.5)
nRBC: 0 % (ref 0.0–0.2)

## 2020-04-15 LAB — SURGICAL PATHOLOGY

## 2020-04-15 MED ORDER — IBUPROFEN 600 MG PO TABS
600.0000 mg | ORAL_TABLET | Freq: Four times a day (QID) | ORAL | Status: DC | PRN
  Administered 2020-04-15: 600 mg via ORAL
  Filled 2020-04-15: qty 1

## 2020-04-15 MED ORDER — HYDROMORPHONE HCL 2 MG PO TABS
2.0000 mg | ORAL_TABLET | Freq: Four times a day (QID) | ORAL | Status: DC | PRN
  Administered 2020-04-15 – 2020-04-16 (×4): 2 mg via ORAL
  Filled 2020-04-15 (×4): qty 1

## 2020-04-15 NOTE — Progress Notes (Signed)
1 Day Post-Op Procedure(s) (LRB): HYSTERECTOMY SUPRACERVICAL ABDOMINAL (N/A) OPEN BILATERAL SALPINGO OOPHORECTOMY (Bilateral)  Subjective: Patient reports nausea, incisional pain and no problems voiding.  Vomited x 1 at 5 pm yesterday. Pain similar to prior mastectomy pain. Willing to transition to PO today.  Objective: I have reviewed patient's vital signs, intake and output and labs. Labs still pending from 5 am. General: alert, cooperative, no distress and good color Resp: clear to auscultation bilaterally Cardio: normal apical impulse GI: soft, non-tender; bowel sounds normal; no masses,  no organomegaly and incision: clean, dry, intact and minimal dressing ooze. Vaginal Bleeding: none  Assessment: s/p Procedure(s): HYSTERECTOMY SUPRACERVICAL ABDOMINAL (N/A) OPEN BILATERAL SALPINGO OOPHORECTOMY (Bilateral): stable and transitioning to po analgesics. will reassess at noon for possible d/c today.  Plan: Advance diet Encourage ambulation Advance to PO medication Discontinue IV fluids  LOS: 1 day    [name] 04/15/2020, 7:16 AM

## 2020-04-16 ENCOUNTER — Other Ambulatory Visit (HOSPITAL_COMMUNITY): Payer: Medicaid Other

## 2020-04-16 ENCOUNTER — Ambulatory Visit (HOSPITAL_COMMUNITY): Payer: Medicaid Other

## 2020-04-16 MED ORDER — IBUPROFEN 600 MG PO TABS: 600.0000 mg | ORAL_TABLET | Freq: Four times a day (QID) | ORAL | 0 refills | Status: AC | PRN

## 2020-04-16 MED ORDER — FERROUS SULFATE 325 (65 FE) MG PO TABS: 325.0000 mg | ORAL_TABLET | Freq: Every day | ORAL | 1 refills | Status: DC

## 2020-04-16 MED ORDER — OXYCODONE HCL 5 MG PO TABS: 5.0000 mg | ORAL_TABLET | ORAL | 0 refills | Status: DC | PRN

## 2020-04-16 NOTE — Progress Notes (Signed)
2 Days Post-Op Procedure(s) (LRB): HYSTERECTOMY SUPRACERVICAL ABDOMINAL (N/A) OPEN BILATERAL SALPINGO OOPHORECTOMY (Bilateral)  Subjective: Patient reports incisional pain, tolerating PO, + flatus and no problems voiding.    Objective: I have reviewed patient's vital signs, medications and labs.  General: alert, cooperative, fatigued and no distress Resp: clear to auscultation bilaterally GI: soft, non-tender; bowel sounds normal; no masses,  no organomegaly and incision: clean, dry, intact and dressing with 2 tiny spots of old dried blood, unchanged x 24 hr Vaginal Bleeding: minimal  Assessment: s/p Procedure(s): HYSTERECTOMY SUPRACERVICAL ABDOMINAL (N/A) OPEN BILATERAL SALPINGO OOPHORECTOMY (Bilateral): stable and tolerating diet  Plan: Discontinue IV fluids Discharge home on oxycodone, miralax, and pt tolerating vasomotor sx.  LOS: 2 days    Monica Hancock 04/16/2020, 7:09 AM

## 2020-04-16 NOTE — Discharge Instructions (Signed)
Glo Herring available at [phone] cell over weekend   Abdominal Hysterectomy Abdominal hysterectomy is a surgical procedure to remove the womb (uterus). The uterus is the muscular organ that houses a developing baby. This surgery may be done if:  You have cancer.  You have growths (tumors or fibroids) in the uterus.  You have long-term (chronic) pain.  You are bleeding.  Your uterus has slipped down into your vagina (uterine prolapse).  You have a condition in which the tissue that lines the uterus grows outside of its normal location (endometriosis).  You have an infection in your uterus.  You are having problems with your menstrual cycle. Depending on why you are having this procedure, you may also have other reproductive organs removed. These could include:  The part of your vagina that connects with your uterus (cervix).  The organs that make eggs (ovaries).  The tubes that connect the ovaries to the uterus (fallopian tubes). Tell a health care provider about:  Any allergies you have.  All medicines you are taking, including vitamins, herbs, eye drops, creams, and over-the-counter medicines.  Any problems you or family members have had with anesthetic medicines.  Any blood disorders you have.  Any surgeries you have had.  Any medical conditions you have.  Whether you are pregnant or may be pregnant. What are the risks? Generally, this is a safe procedure. However, problems may occur, including:  Bleeding.  Infection.  Allergic reactions to medicines or dyes.  Damage to other structures or organs.  Nerve injury.  Decreased interest in sex or pain during sex.  Blood clots that can break free and travel to your lungs. What happens before the procedure? Staying hydrated Follow instructions from your health care provider about hydration, which may include:  Up to 2 hours before the procedure - you may continue to drink clear liquids, such as water, clear  fruit juice, black coffee, and plain tea Eating and drinking restrictions Follow instructions from your health care provider about eating and drinking, which may include:  8 hours before the procedure - stop eating heavy meals or foods such as meat, fried foods, or fatty foods.  6 hours before the procedure - stop eating light meals or foods, such as toast or cereal.  6 hours before the procedure - stop drinking milk or drinks that contain milk.  2 hours before the procedure - stop drinking clear liquids. Medicines  Ask your health care provider about: ? Changing or stopping your regular medicines. This is especially important if you are taking diabetes medicines or blood thinners. ? Taking medicines such as aspirin and ibuprofen. These medicines can thin your blood. Do not take these medicines before your procedure if your health care provider instructs you not to.  You may be given antibiotic medicine to help prevent infection. Take it as told by your health care provider.  You may be asked to take laxatives to prevent constipation. General instructions  Ask your health care provider how your surgical site will be marked or identified.  You may be asked to shower with a germ-killing soap.  Plan to have someone take you home from the hospital.  Do not use any products that contain nicotine or tobacco, such as cigarettes and e-cigarettes. If you need help quitting, ask your health care provider.  You may have an exam or testing.  You may have a blood or urine sample taken.  You may need to have an enema to clean out your rectum and  lower colon.  This procedure can affect the way you feel about yourself. Talk to your health care provider about the physical and emotional changes this procedure may cause. What happens during the procedure?  To lower your risk of infection: ? Your health care team will wash or sanitize their hands. ? Your skin will be washed with soap. ? Hair  may be removed from the surgical area.  An IV tube will be inserted into one of your veins.  You will be given one or more of the following: ? A medicine to help you relax (sedative). ? A medicine to make you fall asleep (general anesthetic).  Tight-fitting (compression) stockings will be placed on your legs to promote circulation.  A thin, flexible tube (catheter) will be inserted to help drain your urine.  The surgeon will make a cut (incision) through the skin in your lower belly. The incision may go side-to-side or up-and-down.  The surgeon will move aside the body tissue that covers your uterus. The surgeon will then carefully take out your uterus along with any of the other organs that need to be removed.  Bleeding will be controlled with clamps or sutures.  The surgeon will close your incision with stitches (sutures), skin glue, or adhesive strips.  A bandage (dressing) will be placed over the incision. The procedure may vary among health care providers and hospitals. What happens after the procedure?  You will be given pain medicine as needed.  Your blood pressure, heart rate, breathing rate, and blood oxygen level will be monitored until the medicines you were given have worn off.  You will need to stay in the hospital to recover for one to two days. Ask your health care provider how long you will need to stay in the hospital after your procedure.  You may have a liquid diet at first. You will most likely return to your usual diet the day after surgery.  You will still have the urinary catheter in place. It will likely be removed the day after surgery.  You may have to wear compression stockings. These stockings help to prevent blood clots and reduce swelling in your legs.  You will be encouraged to walk as soon as possible. You will also use a device or do breathing exercises to keep your lungs clear.  You may need to use a sanitary napkin for vaginal  discharge. Summary  Abdominal hysterectomy is a surgical procedure to remove the womb (uterus). The uterus is the muscular organ that houses a developing baby.  This procedure can affect the way you feel about yourself. Talk to your health care provider about the physical and emotional changes this procedure may cause.  You will be given medicines for pain after the procedure.  You will need to stay in the hospital to recover. Ask your health care provider how long you will need to stay in the hospital after your procedure. This information is not intended to replace advice given to you by your health care provider. Make sure you discuss any questions you have with your health care provider. Document Revised: 12/12/2018 Document Reviewed: 10/26/2016 Elsevier Patient Education  [employer].  Use of stool softener daily is recommended MiraLAX 1 capful every morning is good, or Colace

## 2020-04-16 NOTE — Progress Notes (Signed)
IV removed, 2x2 gauze and paper tape applied to site, patient tolerated well. Reviewed AVS with patient who verbalized understanding.  Patient awaiting arrival of her husband to transport home.  Patient to be taken to front lobby via wheelchair.

## 2020-04-16 NOTE — Discharge Summary (Signed)
Physician Discharge Summary  Patient ID: Monica Hancock MRN: [id number] DOB/AGE: 05/22/1981 39 y.o.  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  An ellipse of skin was removed from the lower abdomen and with underlying fatty tissue to improve access to pelvis.  Postoperative course was notable for increased pain discomfort requiring IV PCA for the first 24 hours, switching to oral Dilaudid, with discharge on oxycodone.  The patient was comfortable at time of discharge with return of bowel function, as evidenced by regular passage of gas.  Incision was clean dry intact.  The patient was afebrile. Temp:  [97.7 F (36.5 C)-98.5 F (36.9 C)] 98.5 F (36.9 C) (05/27 0550) Pulse Rate:  [53-60] 53 (05/27 0550) Resp:  [16-20] 20 (05/27 0550) BP: (101-119)/(58-79) 119/79 (05/27 0550) SpO2:  [96 %-100 %] 100 % (05/27 0550)  CBC Latest Ref Rng & Units 04/15/2020 04/08/2020 03/26/2020  WBC 4.0 - 10.5 K/uL 10.2 7.9 5.5  Hemoglobin 12.0 - 15.0 g/dL 12.1 14.1 13.4  Hematocrit 36.0 - 46.0 % 37.4 43.0 41.0  Platelets 150 - 400 K/uL 263 335 288   CMP Latest Ref Rng & Units 04/15/2020 04/08/2020 03/26/2020  Glucose 70 - 99 mg/dL 142(H) 95 103(H)  BUN 6 - 20 mg/dL 9 17 13   Creatinine 0.44 - 1.00 mg/dL 0.57 0.60 0.50  Sodium 135 - 145 mmol/L 136 137 137  Potassium 3.5 - 5.1 mmol/L 4.4 3.5 3.8  Chloride 98 - 111 mmol/L 101 104 103  CO2 22 - 32 mmol/L 25 25 25   Calcium 8.9 - 10.3 mg/dL 9.8 9.7 10.0  Total Protein 6.5 - 8.1 g/dL - 7.6 7.7  Total  Bilirubin 0.3 - 1.2 mg/dL - 0.8 0.6  Alkaline Phos 38 - 126 U/L - 86 80  AST 15 - 41 U/L - 18 19  ALT 0 - 44 U/L - 29 29    Consults: None  Significant Diagnostic Studies: labs: As above  Treatments: surgery: Abdominal supracervical hysterectomy bilateral salpingo-oophorectomy  Discharge Exam: Blood pressure 119/79, pulse (!) 53, temperature 98.5 F (36.9 C), temperature source Oral, resp. rate 20, last menstrual period 09/29/2019, SpO2 100 %. General appearance: alert, cooperative, fatigued and no distress Head: Normocephalic, without obvious abnormality, atraumatic GI: soft, non-tender; bowel sounds normal; no masses,  no organomegaly and Incision clean dry intact with honeycomb dressing in place Extremities: extremities normal, atraumatic, no cyanosis or edema and Homans sign is negative, no sign of DVT  Disposition: Discharge disposition: 01-Home or Self Care       Discharge Instructions    Call MD for:   Complete by: As directed    Dr Glo Herring may be reached on his cell phone at [phone].   Call MD for:  persistant nausea and vomiting   Complete by: As directed    Call MD for:  severe uncontrolled pain   Complete by: As directed    Call MD for:  temperature >100.4   Complete by: As directed    Diet - low sodium heart healthy   Complete by: As directed    Increase activity slowly   Complete by: As directed  Allergies as of 04/16/2020   No Known Allergies     Medication List    TAKE these medications   acetaminophen 325 MG tablet Commonly known as: TYLENOL Take 650 mg by mouth every 6 (six) hours as needed.   anastrozole 1 MG tablet Commonly known as: ARIMIDEX Take 1 tablet (1 mg total) by mouth daily.   diphenhydrAMINE 50 MG tablet Commonly known as: BENADRYL Take 50 mg by mouth every 8 (eight) hours as needed for allergies.   ferrous sulfate 325 (65 FE) MG tablet Commonly known as: FerrouSul Take 1 tablet (325 mg total) by mouth daily with  breakfast. What changed: when to take this   ibuprofen 600 MG tablet Commonly known as: ADVIL Take 1 tablet (600 mg total) by mouth every 6 (six) hours as needed for fever or headache.   lidocaine-prilocaine cream Commonly known as: EMLA Apply 1 application topically See admin instructions. Apply small amount to port a cath and cover with plastic wrap 1 hour prior to chemotherapy   loratadine 10 MG tablet Commonly known as: CLARITIN Take 10 mg by mouth daily as needed for allergies.   magnesium oxide 400 (241.3 Mg) MG tablet Commonly known as: MAG-OX Take 1 tablet (400 mg total) by mouth 3 (three) times daily. What changed: when to take this   oxyCODONE 5 MG immediate release tablet Commonly known as: Oxy IR/ROXICODONE Take 1-2 tablets (5-10 mg total) by mouth every 4 (four) hours as needed for moderate pain.   pertuzumab in sodium chloride 0.9 % 250 mL Inject into the vein every 21 ( twenty-one) days.   potassium chloride 10 MEQ tablet Commonly known as: KLOR-CON Take 2 tablets (20 mEq total) by mouth 2 (two) times daily.   prochlorperazine 10 MG tablet Commonly known as: COMPAZINE Take 1 tablet (10 mg total) by mouth every 6 (six) hours as needed for nausea or vomiting.   trastuzumab-dkst 2 mg/kg in sodium chloride 0.9 % 250 mL Inject into the vein every 21 ( twenty-one) days.      Follow-up Information    [name], MD Follow up on 04/22/2020.   Specialties: Obstetrics and Gynecology, Radiology Contact information: [place] [address] [phone]           Signed: [name] 04/16/2020, 7:17 AM

## 2020-04-22 ENCOUNTER — Encounter: Payer: Self-pay | Admitting: Obstetrics and Gynecology

## 2020-04-22 ENCOUNTER — Ambulatory Visit (INDEPENDENT_AMBULATORY_CARE_PROVIDER_SITE_OTHER): Payer: Medicaid Other | Admitting: Obstetrics and Gynecology

## 2020-04-22 VITALS — BP 112/80 | HR 87 | Ht <= 58 in | Wt 149.0 lb

## 2020-04-22 DIAGNOSIS — Z09 Encounter for follow-up examination after completed treatment for conditions other than malignant neoplasm: Secondary | ICD-10-CM

## 2020-04-22 MED ORDER — HYDROCHLOROTHIAZIDE 25 MG PO TABS
25.0000 mg | ORAL_TABLET | Freq: Every day | ORAL | 0 refills | Status: DC
Start: 2020-04-22 — End: 2020-05-15

## 2020-04-22 NOTE — Progress Notes (Signed)
Patient ID: Monica Hancock, female   DOB: 19-Aug-1981, 39 y.o.   MRN: [id number]  Subjective:  Monica Hancock is a 39 y.o. female now 8 days status post supracervical abdominal hysterectomy w/ open B/L salpingo oophorectomy. She reports that her pain has improved.   Review of Systems Negative   Diet: negative   Bowel movements: normal. Pain is improved with oxycodone 5 mg  Objective:  LMP 09/29/2019  General:Well developed, well nourished.  No acute distress. Abdomen: Bowel sounds normal, soft, non-tender. Pelvic Exam: DEFERRED  Incision(s): Healing well. Mild swelling above the incision site. Mild oozing.  Assessment:  Post-Op 8 days status post supracervical abdominal hysterectomy w/ open B/L salpingo oophorectomy.  Doing well postoperatively. Steri Strips were replaced today.   Plan:  1. Wound care discussed. Apply sanitary napkin to incision site.  2.   Current medications: Prescribed fluid pill to take for 1 week.HCTZ 25 mg x 10 d 3.   Activity restrictions: no bending, stooping, or squatting and no overhead lifting  4.   Return to work: not applicable. 5.   Follow up in 3 weeks.  By signing my name below, I, De Burrs, attest that this documentation has been prepared under the direction and in the presence of [name], MD. Electronically Signed: De Burrs, Medical Scribe. 04/22/20. 11:18 AM.  I personally performed the services described in this documentation, which was SCRIBED in my presence. The recorded information has been reviewed and considered accurate. It has been edited as necessary during review. [name], MD

## 2020-04-24 ENCOUNTER — Other Ambulatory Visit: Payer: Self-pay

## 2020-04-24 ENCOUNTER — Inpatient Hospital Stay (HOSPITAL_BASED_OUTPATIENT_CLINIC_OR_DEPARTMENT_OTHER): Payer: Medicaid Other | Admitting: Hematology

## 2020-04-24 ENCOUNTER — Inpatient Hospital Stay (HOSPITAL_COMMUNITY): Payer: Medicaid Other | Attending: Hematology

## 2020-04-24 ENCOUNTER — Inpatient Hospital Stay (HOSPITAL_COMMUNITY): Payer: Medicaid Other

## 2020-04-24 VITALS — BP 137/81 | HR 97 | Temp 97.7°F | Resp 18 | Wt 148.3 lb

## 2020-04-24 VITALS — BP 122/79 | HR 81 | Temp 97.3°F | Resp 18

## 2020-04-24 DIAGNOSIS — Z5112 Encounter for antineoplastic immunotherapy: Secondary | ICD-10-CM | POA: Diagnosis present

## 2020-04-24 DIAGNOSIS — C50411 Malignant neoplasm of upper-outer quadrant of right female breast: Secondary | ICD-10-CM | POA: Diagnosis not present

## 2020-04-24 DIAGNOSIS — Z79899 Other long term (current) drug therapy: Secondary | ICD-10-CM | POA: Diagnosis not present

## 2020-04-24 DIAGNOSIS — Z170 Estrogen receptor positive status [ER+]: Secondary | ICD-10-CM

## 2020-04-24 DIAGNOSIS — Z791 Long term (current) use of non-steroidal anti-inflammatories (NSAID): Secondary | ICD-10-CM | POA: Diagnosis not present

## 2020-04-24 DIAGNOSIS — Z9013 Acquired absence of bilateral breasts and nipples: Secondary | ICD-10-CM | POA: Diagnosis not present

## 2020-04-24 DIAGNOSIS — Z90710 Acquired absence of both cervix and uterus: Secondary | ICD-10-CM | POA: Insufficient documentation

## 2020-04-24 DIAGNOSIS — Z90722 Acquired absence of ovaries, bilateral: Secondary | ICD-10-CM | POA: Insufficient documentation

## 2020-04-24 DIAGNOSIS — Z9221 Personal history of antineoplastic chemotherapy: Secondary | ICD-10-CM | POA: Diagnosis not present

## 2020-04-24 DIAGNOSIS — E876 Hypokalemia: Secondary | ICD-10-CM | POA: Insufficient documentation

## 2020-04-24 LAB — CBC WITH DIFFERENTIAL/PLATELET
Abs Immature Granulocytes: 0.03 10*3/uL (ref 0.00–0.07)
Basophils Absolute: 0 10*3/uL (ref 0.0–0.1)
Basophils Relative: 1 %
Eosinophils Absolute: 0.1 10*3/uL (ref 0.0–0.5)
Eosinophils Relative: 1 %
HCT: 42 % (ref 36.0–46.0)
Hemoglobin: 13.9 g/dL (ref 12.0–15.0)
Immature Granulocytes: 0 %
Lymphocytes Relative: 40 %
Lymphs Abs: 3 10*3/uL (ref 0.7–4.0)
MCH: 28.9 pg (ref 26.0–34.0)
MCHC: 33.1 g/dL (ref 30.0–36.0)
MCV: 87.3 fL (ref 80.0–100.0)
Monocytes Absolute: 0.5 10*3/uL (ref 0.1–1.0)
Monocytes Relative: 6 %
Neutro Abs: 3.8 10*3/uL (ref 1.7–7.7)
Neutrophils Relative %: 52 %
Platelets: 386 10*3/uL (ref 150–400)
RBC: 4.81 MIL/uL (ref 3.87–5.11)
RDW: 12.9 % (ref 11.5–15.5)
WBC: 7.5 10*3/uL (ref 4.0–10.5)
nRBC: 0 % (ref 0.0–0.2)

## 2020-04-24 LAB — COMPREHENSIVE METABOLIC PANEL
ALT: 22 U/L (ref 0–44)
AST: 19 U/L (ref 15–41)
Albumin: 4.2 g/dL (ref 3.5–5.0)
Alkaline Phosphatase: 96 U/L (ref 38–126)
Anion gap: 16 — ABNORMAL HIGH (ref 5–15)
BUN: 15 mg/dL (ref 6–20)
CO2: 25 mmol/L (ref 22–32)
Calcium: 10.2 mg/dL (ref 8.9–10.3)
Chloride: 96 mmol/L — ABNORMAL LOW (ref 98–111)
Creatinine, Ser: 0.73 mg/dL (ref 0.44–1.00)
GFR calc Af Amer: >60 mL/min (ref >60)
GFR calc non Af Amer: >60 mL/min (ref >60)
Glucose, Bld: 135 mg/dL — ABNORMAL HIGH (ref 70–99)
Potassium: 3.2 mmol/L — ABNORMAL LOW (ref 3.5–5.1)
Sodium: 137 mmol/L (ref 135–145)
Total Bilirubin: 0.8 mg/dL (ref 0.3–1.2)
Total Protein: 8.2 g/dL — ABNORMAL HIGH (ref 6.5–8.1)

## 2020-04-24 LAB — MAGNESIUM: Magnesium: 1.9 mg/dL (ref 1.7–2.4)

## 2020-04-24 MED ORDER — DIPHENHYDRAMINE HCL 25 MG PO CAPS
25.0000 mg | ORAL_CAPSULE | Freq: Once | ORAL | Status: AC
  Administered 2020-04-24: 25 mg via ORAL
  Filled 2020-04-24: qty 1

## 2020-04-24 MED ORDER — SODIUM CHLORIDE 0.9 % IV SOLN: Freq: Once | INTRAVENOUS | Status: AC

## 2020-04-24 MED ORDER — HEPARIN SOD (PORK) LOCK FLUSH 100 UNIT/ML IV SOLN
500.0000 [IU] | Freq: Once | INTRAVENOUS | Status: AC | PRN
  Administered 2020-04-24: 500 [IU]

## 2020-04-24 MED ORDER — TRASTUZUMAB CHEMO 150 MG IV SOLR
8.0000 mg/kg | Freq: Once | INTRAVENOUS | Status: AC
  Administered 2020-04-24: 525 mg via INTRAVENOUS
  Filled 2020-04-24: qty 25

## 2020-04-24 MED ORDER — SODIUM CHLORIDE 0.9% FLUSH
10.0000 mL | INTRAVENOUS | Status: DC | PRN
  Administered 2020-04-24: 10 mL via INTRAVENOUS

## 2020-04-24 MED ORDER — SODIUM CHLORIDE 0.9% FLUSH
10.0000 mL | INTRAVENOUS | Status: DC | PRN
  Administered 2020-04-24: 10 mL

## 2020-04-24 MED ORDER — SODIUM CHLORIDE 0.9 % IV SOLN
420.0000 mg | Freq: Once | INTRAVENOUS | Status: AC
  Administered 2020-04-24: 420 mg via INTRAVENOUS
  Filled 2020-04-24: qty 14

## 2020-04-24 MED ORDER — TRASTUZUMAB-DKST CHEMO 150 MG IV SOLR: 6.0000 mg/kg | Freq: Once | INTRAVENOUS | Status: DC

## 2020-04-24 MED ORDER — ACETAMINOPHEN 325 MG PO TABS
650.0000 mg | ORAL_TABLET | Freq: Once | ORAL | Status: AC
  Administered 2020-04-24: 650 mg via ORAL
  Filled 2020-04-24: qty 2

## 2020-04-24 NOTE — Patient Instructions (Addendum)
[place] at [facility] Discharge Instructions  You were seen today by Dr. Delton Coombes. He went over your recent results. Dr. Delton Coombes recommends that you start taking over the counter calcium and vitamin D supplements. Please start taking the estrogen blocker tablet; you will most likely experience hot flashes. Your next treatment will be in 3 weeks. Dr. Delton Coombes will see you back in 6 weeks for labs and follow up.   Thank you for choosing [place] at [facility] to provide your oncology and hematology care.  To afford each patient quality time with our provider, please arrive at least 15 minutes before your scheduled appointment time.   If you have a lab appointment with the [facility] please come in thru the  Main Entrance and check in at the main information desk  You need to re-schedule your appointment should you arrive 10 or more minutes late.  We strive to give you quality time with our providers, and arriving late affects you and other patients whose appointments are after yours.  Also, if you no show three or more times for appointments you may be dismissed from the clinic at the providers discretion.     Again, thank you for choosing [facility].  Our hope is that these requests will decrease the amount of time that you wait before being seen by our physicians.       _____________________________________________________________  Should you have questions after your visit to [facility], please contact our office at [phone] between the hours of 8:00 a.m. and 4:30 p.m.  Voicemails left after 4:00 p.m. will not be returned until the following business day.  For prescription refill requests, have your pharmacy contact our office and allow 72 hours.    Cancer Center Support Programs:   > Cancer Support Group  2nd Tuesday of the month 1pm-2pm, Journey Room

## 2020-04-24 NOTE — Progress Notes (Signed)
04/24/20  Reload today's dose of trastuzumab due to week delay in dose.  Today's dose will be 8 mg/kg.  Will give Herceptin brand name due to stock in house.  Patient has had Herceptin previously with no unwanted events occurring.  Marland KitchenPharmacist Chemotherapy Monitoring - Follow Up Assessment    I verify that I have reviewed each item in the below checklist:  . Regimen for the patient is scheduled for the appropriate day and plan matches scheduled date. Marland Kitchen Appropriate non-routine labs are ordered dependent on drug ordered. . If applicable, additional medications reviewed and ordered per protocol based on lifetime cumulative doses and/or treatment regimen.   Plan for follow-up and/or issues identified: No . I-vent associated with next due treatment: Yes -- is reload wanted for today? . MD and/or nursing notified: Yes  Wynona Neat 04/24/2020 10:30 AM   T.O.Dr Rhys Martini, PharmD

## 2020-04-24 NOTE — Progress Notes (Signed)
Labs reviewed today with MD. Will proceed with treatment per MD orders. See Pharmacy note for detail about Herceptin today.   Treatment given per orders. Patient tolerated it well without problems. Vitals stable and discharged home from clinic ambulatory. Follow up as scheduled.

## 2020-04-24 NOTE — Progress Notes (Signed)
[place], [place] [address]   CLINIC:  Medical Oncology/Hematology  PCP:  Monica Hancock, MD [address] 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 [facility])   08/27/2019 - 12/30/2019 Chemotherapy   The patient had palonosetron (ALOXI) injection 0.25 mg, 0.25 mg, Intravenous,  Once, 6 of 6 cycles Administration: 0.25 mg (08/27/2019), 0.25 mg (09/17/2019), 0.25 mg (11/19/2019), 0.25 mg (12/10/2019), 0.25 mg (10/08/2019), 0.25 mg (10/29/2019) pegfilgrastim-jmdb (FULPHILA) injection 6 mg, 6 mg, Subcutaneous,  Once, 6 of 6 cycles Administration: 6 mg (08/29/2019), 6 mg (09/19/2019), 6 mg (11/21/2019), 6 mg (12/12/2019), 6 mg (10/10/2019), 6 mg (10/31/2019) trastuzumab (HERCEPTIN) 600 mg in sodium chloride 0.9 % 250 mL chemo infusion, 567 mg (100 % of original dose 8 mg/kg), Intravenous,  Once, 1 of 1 cycle Dose modification: 8 mg/kg (original dose 8 mg/kg, Cycle 1, Reason: Other (see comments), Comment: using up last of our Herceptin at [facility]) Administration: 600 mg (08/27/2019) CARBOplatin (PARAPLATIN) 780 mg in sodium chloride 0.9 % 250 mL chemo infusion, 780 mg (100 % of original dose 784.8 mg), Intravenous,  Once, 6 of 6 cycles Dose modification:   (original dose 784.8 mg, Cycle 1),   (original dose 784.8 mg, Cycle 2),   (original dose 784.8 mg, Cycle 5), 784.8 mg (original dose 784.8 mg, Cycle 5, Reason: Other (see comments), Comment: scr 0.8 entered),   (original dose 784.8 mg, Cycle 6),   (original dose 784.8 mg, Cycle 3),   (original dose 784.8 mg, Cycle 4) Administration: 780 mg (08/27/2019), 780 mg  (09/17/2019), 780 mg (11/19/2019), 780 mg (12/10/2019), 780 mg (10/08/2019), 780 mg (10/29/2019) DOCEtaxel (TAXOTERE) 130 mg in sodium chloride 0.9 % 250 mL chemo infusion, 75 mg/m2 = 130 mg, Intravenous,  Once, 6 of 6 cycles Administration: 130 mg (08/27/2019), 130 mg (09/17/2019), 130 mg (11/19/2019), 130 mg (12/10/2019), 130 mg (10/08/2019), 130 mg (10/29/2019) pertuzumab (PERJETA) 840 mg in sodium chloride 0.9 % 250 mL chemo infusion, 840 mg, Intravenous, Once, 6 of 6 cycles Administration: 840 mg (08/27/2019), 420 mg (09/17/2019), 420 mg (11/19/2019), 420 mg (12/10/2019), 420 mg (10/08/2019), 420 mg (10/29/2019) fosaprepitant (EMEND) 150 mg, dexamethasone (DECADRON) 12 mg in sodium chloride 0.9 % 145 mL IVPB, , Intravenous,  Once, 6 of 6 cycles Administration:  (08/27/2019),  (09/17/2019),  (11/19/2019),  (12/10/2019),  (10/08/2019),  (10/29/2019) trastuzumab-dkst (OGIVRI) 450 mg in sodium chloride 0.9 % 250 mL chemo infusion, 420 mg, Intravenous,  Once, 5 of 5 cycles Administration: 450 mg (09/17/2019), 450 mg (11/19/2019), 420 mg (12/10/2019), 450 mg (10/08/2019), 450 mg (10/29/2019)  for chemotherapy treatment.    01/02/2020 -  Chemotherapy   The patient had pertuzumab (PERJETA) 420 mg in sodium chloride 0.9 % 250 mL chemo infusion, 420 mg (100 % of original dose 420 mg), Intravenous, Once, 5 of 10 cycles Dose modification: 420 mg (original dose 420 mg, Cycle 1, Reason: Provider Judgment) Administration: 420 mg (01/02/2020), 420 mg (01/23/2020), 420 mg (03/26/2020), 420 mg (02/13/2020), 420 mg (03/05/2020) trastuzumab-dkst (OGIVRI) 450 mg in sodium chloride 0.9 % 250 mL chemo infusion, 420 mg (100 % of original dose 6 mg/kg), Intravenous,  Once, 5 of 10 cycles  Dose modification: 6 mg/kg (original dose 6 mg/kg, Cycle 1, Reason: Provider Judgment) Administration: 450 mg (01/02/2020), 420 mg (01/23/2020), 420 mg (03/26/2020), 420 mg (02/13/2020), 420 mg (03/05/2020)  for chemotherapy treatment.      CANCER  STAGING: Cancer Staging No matching staging information was found for the patient.  INTERVAL HISTORY:  [name], a 40 y.o. female, returns for routine follow-up of HER-2 positive R breast cancer. Monica Hancock was last seen on 03/26/2020.  She reports feeling well today. She had fluid in her abdomen after her oopherectomy on 04/14/2020 and needed to get diuresed. Her incision is healing nicely, though it is still painful to touch. She reports that her jaw locking up has resolved. She denies having orthopnea. She reports having diarrhea the day of receiving her shot which resolves after taking Imodium.   REVIEW OF SYSTEMS:  Review of Systems  Constitutional: Negative for appetite change and fatigue.  Gastrointestinal: Positive for abdominal pain.    PAST MEDICAL/SURGICAL HISTORY:  Past Medical History:  Diagnosis Date  . BRCA1 positive   . Cancer [facility])    Right Breast  . Family history of adverse reaction to anesthesia    PONV-children  . Family history of BRCA1 gene positive   . Family history of breast cancer   . H/O Bell's palsy 2000   Left sided  . HSV infection    Past Surgical History:  Procedure Laterality Date  . BREAST BIOPSY Right   . IR IMAGING GUIDED PORT INSERTION  08/26/2019  . MASTECTOMY MODIFIED RADICAL Right 01/24/2020   Procedure: RIGHT MASTECTOMY MODIFIED RADICAL;  Surgeon: Aviva Signs, MD;  Location: [facility];  Service: General;  Laterality: Right;  . SALPINGOOPHORECTOMY Bilateral 04/14/2020   Procedure: OPEN BILATERAL SALPINGO OOPHORECTOMY;  Surgeon: [name], MD;  Location: [facility];  Service: Gynecology;  Laterality: Bilateral;  . SIMPLE MASTECTOMY WITH AXILLARY SENTINEL NODE BIOPSY Left 01/24/2020   Procedure: LEFT SIMPLE MASTECTOMY;  Surgeon: Aviva Signs, MD;  Location: [facility];  Service: General;  Laterality: Left;  . SUPRACERVICAL ABDOMINAL HYSTERECTOMY N/A 04/14/2020   Procedure: HYSTERECTOMY SUPRACERVICAL ABDOMINAL;  Surgeon: [name], MD;  Location: [facility];  Service: Gynecology;  Laterality: N/A;  . TUBAL LIGATION      SOCIAL HISTORY:  Social History   Socioeconomic History  . Marital status: Married    Spouse name: [name]  . Number of children: 5  . Years of education: Not on file  . Highest education level: 12th grade  Occupational History  . Not on file  Tobacco Use  . Smoking status: Never Smoker  . Smokeless tobacco: Never Used  Substance and Sexual Activity  . Alcohol use: No  . Drug use: No  . Sexual activity: Not Currently    Birth control/protection: Surgical    Comment: [facility]  Other Topics Concern  . Not on file  Social History Narrative  . Not on file   Social Determinants of Health   Financial Resource Strain: Medium Risk  . Difficulty of Paying Living Expenses: Somewhat hard  Food Insecurity: Food Insecurity Present  . Worried About Charity fundraiser in the Last Year: Sometimes true  . Ran Out of Food in the Last Year: Sometimes true  Transportation Needs: No Transportation Needs  . Lack of Transportation (Medical): No  . Lack of Transportation (Non-Medical): No  Physical Activity: Inactive  . Days of Exercise per Week: 0 days  . Minutes of Exercise per Session: 0 min  Stress: No Stress Concern Present  . Feeling of Stress : Only a little  Social Connections: Not Isolated  . Frequency of Communication with Friends and Family: More than three times a week  . Frequency of Social Gatherings with Friends and Family: More than three times a week  . Attends Religious Services: More than 4 times per year  . Active Member of Clubs or Organizations: Yes  . Attends Archivist Meetings: More than 4 times per year  . Marital Status: Married  Human resources officer Violence: Not At Risk  . Fear of Current or Ex-Partner: No  . Emotionally Abused: No  . Physically Abused: No  . Sexually Abused: No    FAMILY HISTORY:  Family History  Problem Relation Age of Onset  . Diabetes Mother    . Breast cancer Mother 62  . Cancer Mother   . Diabetes Maternal Grandmother   . Stomach cancer Maternal Grandfather   . Breast cancer Maternal Aunt        dx in her 20s  . Diabetes Maternal Aunt     CURRENT MEDICATIONS:  Current Outpatient Medications  Medication Sig Dispense Refill  . ferrous sulfate (FERROUSUL) 325 (65 FE) MG tablet Take 1 tablet (325 mg total) by mouth daily with breakfast. 90 tablet 1  . hydrochlorothiazide (HYDRODIURIL) 25 MG tablet Take 1 tablet (25 mg total) by mouth daily. 10 tablet 0  . loratadine (CLARITIN) 10 MG tablet Take 10 mg by mouth daily as needed for allergies.     . magnesium oxide (MAG-OX) 400 (241.3 Mg) MG tablet Take 1 tablet (400 mg total) by mouth 3 (three) times daily. (Patient taking differently: Take 400 mg by mouth 2 (two) times daily. ) 90 tablet 1  . pertuzumab in sodium chloride 0.9 % 250 mL Inject into the vein every 21 ( twenty-one) days.    . potassium chloride SA (KLOR-CON) 10 MEQ tablet Take 2 tablets (20 mEq total) by mouth 2 (two) times daily. 120 tablet 3  . trastuzumab-dkst 2 mg/kg in sodium chloride 0.9 % 250 mL Inject into the vein every 21 ( twenty-one) days.    Marland Kitchen acetaminophen (TYLENOL) 325 MG tablet Take 650 mg by mouth every 6 (six) hours as needed.    . diphenhydrAMINE (BENADRYL) 50 MG tablet Take 50 mg by mouth every 8 (eight) hours as needed for allergies.     Marland Kitchen ibuprofen (ADVIL) 600 MG tablet Take 1 tablet (600 mg total) by mouth every 6 (six) hours as needed for fever or headache. (Patient not taking: Reported on 04/24/2020) 30 tablet 0  . lidocaine-prilocaine (EMLA) cream Apply 1 application topically See admin instructions. Apply small amount to port a cath and cover with plastic wrap 1 hour prior to chemotherapy    . oxyCODONE (OXY IR/ROXICODONE) 5 MG immediate release tablet Take 1-2 tablets (5-10 mg total) by mouth every 4 (four) hours as needed for moderate pain. (Patient not taking: Reported on 04/24/2020) 30 tablet 0   . prochlorperazine (COMPAZINE) 10 MG tablet Take 1 tablet (10 mg total) by mouth every 6 (six) hours as needed for nausea or vomiting. (Patient not taking: Reported on 04/24/2020) 30 tablet 3   No current facility-administered medications for this visit.   Facility-Administered Medications Ordered in Other Visits  Medication Dose Route Frequency Provider Last Rate Last Admin  . sodium chloride flush (NS) 0.9 % injection 10 mL  10 mL Intravenous PRN Derek Jack, MD   10 mL at 04/24/20 0945  ALLERGIES:  No Known Allergies  PHYSICAL EXAM:  Performance status (ECOG): 0 - Asymptomatic  Vitals:   04/24/20 0943  BP: 137/81  Pulse: 97  Resp: 18  Temp: 97.7 F (36.5 C)  SpO2: 99%   Wt Readings from Last 3 Encounters:  04/24/20 148 lb 4.8 oz (67.3 kg)  04/22/20 149 lb (67.6 kg)  04/08/20 148 lb (67.1 kg)   Physical Exam Vitals reviewed.  Constitutional:      Appearance: Normal appearance.  Cardiovascular:     Rate and Rhythm: Normal rate and regular rhythm.     Pulses: Normal pulses.     Heart sounds: Normal heart sounds.  Pulmonary:     Effort: Pulmonary effort is normal.     Breath sounds: Normal breath sounds.  Abdominal:     Palpations: Abdomen is soft. There is no mass.     Tenderness: There is abdominal tenderness (over hypogastric incision ).  Neurological:     General: No focal deficit present.     Mental Status: She is alert and oriented to person, place, and time.  Psychiatric:        Mood and Affect: Mood normal.        Behavior: Behavior normal.     LABORATORY DATA:  I have reviewed the labs as listed.  CBC Latest Ref Rng & Units 04/15/2020 04/08/2020 03/26/2020  WBC 4.0 - 10.5 K/uL 10.2 7.9 5.5  Hemoglobin 12.0 - 15.0 g/dL 12.1 14.1 13.4  Hematocrit 36.0 - 46.0 % 37.4 43.0 41.0  Platelets 150 - 400 K/uL 263 335 288   CMP Latest Ref Rng & Units 04/15/2020 04/08/2020 03/26/2020  Glucose 70 - 99 mg/dL 142(H) 95 103(H)  BUN 6 - 20 mg/dL 9 17 13    Creatinine 0.44 - 1.00 mg/dL 0.57 0.60 0.50  Sodium 135 - 145 mmol/L 136 137 137  Potassium 3.5 - 5.1 mmol/L 4.4 3.5 3.8  Chloride 98 - 111 mmol/L 101 104 103  CO2 22 - 32 mmol/L 25 25 25   Calcium 8.9 - 10.3 mg/dL 9.8 9.7 10.0  Total Protein 6.5 - 8.1 g/dL - 7.6 7.7  Total Bilirubin 0.3 - 1.2 mg/dL - 0.8 0.6  Alkaline Phos 38 - 126 U/L - 86 80  AST 15 - 41 U/L - 18 19  ALT 0 - 44 U/L - 29 29    DIAGNOSTIC IMAGING:  I have independently reviewed the scans and discussed with the patient. ECHOCARDIOGRAM COMPLETE  Result Date: 04/08/2020    ECHOCARDIOGRAM REPORT   Patient Name:   Monica Hancock Date of Exam: 04/08/2020 Medical Rec #:  [id number]          Height:       56.0 in Accession #:    [id number]         Weight:       148.0 lb Date of Birth:  May 09, 1981          BSA:          1.562 m Patient Age:    71 years           BP:           128/85 mmHg Patient Gender: F                  HR:           76 bpm. Exam Location:  [facility] Procedure: 2D Echo, Cardiac Doppler and Color Doppler Indications:    C50.411,Z17.0 (ICD-10-CM) - Malignant  neoplasm of upper-outer                 quadrant of right breast in female, estrogen receptor positive  History:        Patient has prior history of Echocardiogram examinations, most                 recent 12/30/2019. Malignant neoplasm of upper-outer quadrant of                 right female breast,S/P bilateral mastectomy.  Sonographer:    Alvino Chapel RCS Referring Phys: [phone] [place]  1. Left ventricular ejection fraction, by estimation, is 60 to 65%. The left ventricle has normal function. The left ventricle has no regional wall motion abnormalities. Left ventricular diastolic parameters were normal.  2. Right ventricular systolic function is normal. The right ventricular size is normal. There is normal pulmonary artery systolic pressure.  3. The mitral valve is normal in structure. No evidence of mitral valve regurgitation. No evidence  of mitral stenosis.  4. The aortic valve is tricuspid. Aortic valve regurgitation is not visualized. No aortic stenosis is present.  5. The inferior vena cava is normal in size with greater than 50% respiratory variability, suggesting right atrial pressure of 3 mmHg. FINDINGS  Left Ventricle: Left ventricular ejection fraction, by estimation, is 60 to 65%. The left ventricle has normal function. The left ventricle has no regional wall motion abnormalities. The left ventricular internal cavity size was normal in size. There is  no left ventricular hypertrophy. Left ventricular diastolic parameters were normal. Right Ventricle: The right ventricular size is normal. No increase in right ventricular wall thickness. Right ventricular systolic function is normal. There is normal pulmonary artery systolic pressure. The tricuspid regurgitant velocity is 1.79 m/s, and  with an assumed right atrial pressure of 3 mmHg, the estimated right ventricular systolic pressure is 96.2 mmHg. Left Atrium: Left atrial size was normal in size. Right Atrium: Right atrial size was normal in size. Pericardium: There is no evidence of pericardial effusion. Mitral Valve: The mitral valve is normal in structure. No evidence of mitral valve regurgitation. No evidence of mitral valve stenosis. Tricuspid Valve: The tricuspid valve is normal in structure. Tricuspid valve regurgitation is mild . No evidence of tricuspid stenosis. Aortic Valve: The aortic valve is tricuspid. Aortic valve regurgitation is not visualized. No aortic stenosis is present. Aortic valve mean gradient measures 3.4 mmHg. Aortic valve peak gradient measures 7.3 mmHg. Aortic valve area, by VTI measures 2.43 cm. Pulmonic Valve: The pulmonic valve was not well visualized. Pulmonic valve regurgitation is not visualized. No evidence of pulmonic stenosis. Aorta: The aortic root is normal in size and structure. Pulmonary Artery: Indeterminant PASP, inadequate TR jet. Venous: The  inferior vena cava is normal in size with greater than 50% respiratory variability, suggesting right atrial pressure of 3 mmHg. IAS/Shunts: No atrial level shunt detected by color flow Doppler.  LEFT VENTRICLE PLAX 2D LVIDd:         3.65 cm     Diastology LVIDs:         2.20 cm     LV e' lateral:   12.00 cm/s LV PW:         0.83 cm     LV E/e' lateral: 5.2 LV IVS:        1.05 cm     LV e' medial:    8.27 cm/s LVOT diam:     1.90 cm  LV E/e' medial:  7.6 LV SV:         62 LV SV Index:   39 LVOT Area:     2.84 cm  LV Volumes (MOD) LV vol d, MOD A2C: 55.4 ml LV vol d, MOD A4C: 68.8 ml LV vol s, MOD A2C: 23.0 ml LV vol s, MOD A4C: 21.0 ml LV SV MOD A2C:     32.4 ml LV SV MOD A4C:     68.8 ml LV SV MOD BP:      39.8 ml RIGHT VENTRICLE RV S prime:     10.00 cm/s TAPSE (M-mode): 1.6 cm LEFT ATRIUM             Index       RIGHT ATRIUM           Index LA diam:        2.80 cm 1.79 cm/m  RA Area:     10.60 cm LA Vol (A2C):   29.6 ml 18.95 ml/m RA Volume:   21.10 ml  13.51 ml/m LA Vol (A4C):   24.6 ml 15.75 ml/m LA Biplane Vol: 28.9 ml 18.50 ml/m  AORTIC VALVE AV Area (Vmax):    2.27 cm AV Area (Vmean):   2.47 cm AV Area (VTI):     2.43 cm AV Vmax:           134.64 cm/s AV Vmean:          85.553 cm/s AV VTI:            0.253 m AV Peak Grad:      7.3 mmHg AV Mean Grad:      3.4 mmHg LVOT Vmax:         108.00 cm/s LVOT Vmean:        74.600 cm/s LVOT VTI:          0.217 m LVOT/AV VTI ratio: 0.86  AORTA Ao Root diam: 3.00 cm MITRAL VALVE               TRICUSPID VALVE MV Area (PHT): 2.19 cm    TR Peak grad:   12.8 mmHg MV Decel Time: 347 msec    TR Vmax:        179.00 cm/s MV E velocity: 62.70 cm/s MV A velocity: 64.60 cm/s  SHUNTS MV E/A ratio:  0.97        Systemic VTI:  0.22 m                            Systemic Diam: 1.90 cm Carlyle Dolly MD Electronically signed by Carlyle Dolly MD Signature Date/Time: 04/08/2020/4:04:13 PM    Final      ASSESSMENT:  1.  Clinical T2N0 right breast IDC, ER positive, HER-2  positive by FISH: -6 cycles of neoadjuvant TCHP from 08/27/2019 through 12/10/2019. -Herceptin and Pertuzumab maintenance started on 01/02/2020. -Bilateral mastectomies on 01/24/2020.  Pathology showed YPT0Y PN 0, 0/6 lymph nodes involved. -She met with Dr. Sondra Come who did not recommend adjuvant radiation. -TAH/BSO on 04/14/2020. -Anastrozole to start on 04/24/2020.   2.  BRCA1 positive: -Bilateral mastectomies on 01/24/2020. -TAH/BSO on 04/14/2020.     PLAN:  1.  Clinical T2N0 right breast IDC, ER positive, HER-2 positive by FISH: -She has recovered very well from surgery.  I reviewed her labs. -She will proceed with her maintenance Herceptin and Pertuzumab.  Will reload Herceptin at 8 mg/kg. -She will continue Herceptin and Pertuzumab in  3 weeks and see [place] back in 6 weeks. -I have instructed her to start taking anastrozole.  We discussed side effects including worsening hot flashes, rare musculoskeletal symptoms and decreased bone mineral density.  2.  BRCA1 positive: -TAH/BSO on 04/14/2020.  Recovering well from surgery.  3.  Hypokalemia: -Continue potassium 20 mEq twice daily.  Potassium today 3.2.  4.  Hypomagnesemia: -Magnesium today is normal.  Continue magnesium twice daily.  5.  High risk drug monitoring: -Echo on 04/08/2020 shows EF 60 to 65%.  No symptoms of PND or orthopnea.     Orders placed this encounter:  No orders of the defined types were placed in this encounter.    Derek Jack, MD [place] [phone]   I, Milinda Antis, am acting as a scribe for Dr. Sanda Linger.  I, Derek Jack MD, have reviewed the above documentation for accuracy and completeness, and I agree with the above.

## 2020-04-24 NOTE — Patient Instructions (Signed)
[facility] Discharge Instructions for Patients Receiving Chemotherapy  Today you received the following chemotherapy agents   To help prevent nausea and vomiting after your treatment, we encourage you to take your nausea medication   If you develop nausea and vomiting that is not controlled by your nausea medication, call the clinic.   BELOW ARE SYMPTOMS THAT SHOULD BE REPORTED IMMEDIATELY:  *FEVER GREATER THAN 100.5 F  *CHILLS WITH OR WITHOUT FEVER  NAUSEA AND VOMITING THAT IS NOT CONTROLLED WITH YOUR NAUSEA MEDICATION  *UNUSUAL SHORTNESS OF BREATH  *UNUSUAL BRUISING OR BLEEDING  TENDERNESS IN MOUTH AND THROAT WITH OR WITHOUT PRESENCE OF ULCERS  *URINARY PROBLEMS  *BOWEL PROBLEMS  UNUSUAL RASH Items with * indicate a potential emergency and should be followed up as soon as possible.  Feel free to call the clinic should you have any questions or concerns. The clinic phone number is [phone].  Please show the CHEMO ALERT CARD at check-in to the Emergency Department and triage nurse.   

## 2020-04-27 ENCOUNTER — Other Ambulatory Visit (HOSPITAL_COMMUNITY): Payer: Self-pay | Admitting: *Deleted

## 2020-04-27 DIAGNOSIS — Z170 Estrogen receptor positive status [ER+]: Secondary | ICD-10-CM

## 2020-05-07 ENCOUNTER — Other Ambulatory Visit (HOSPITAL_COMMUNITY): Payer: Medicaid Other

## 2020-05-07 ENCOUNTER — Ambulatory Visit (HOSPITAL_COMMUNITY): Payer: Medicaid Other | Admitting: Hematology

## 2020-05-07 ENCOUNTER — Ambulatory Visit (HOSPITAL_COMMUNITY): Payer: Medicaid Other

## 2020-05-14 ENCOUNTER — Other Ambulatory Visit (HOSPITAL_COMMUNITY): Payer: Self-pay | Admitting: Hematology

## 2020-05-15 ENCOUNTER — Encounter (HOSPITAL_COMMUNITY): Payer: Self-pay

## 2020-05-15 ENCOUNTER — Other Ambulatory Visit (HOSPITAL_COMMUNITY): Payer: Medicaid Other

## 2020-05-15 ENCOUNTER — Inpatient Hospital Stay (HOSPITAL_COMMUNITY): Payer: Medicaid Other

## 2020-05-15 ENCOUNTER — Other Ambulatory Visit: Payer: Self-pay

## 2020-05-15 VITALS — BP 118/80 | HR 66 | Temp 97.3°F | Resp 18 | Wt 148.0 lb

## 2020-05-15 DIAGNOSIS — Z170 Estrogen receptor positive status [ER+]: Secondary | ICD-10-CM

## 2020-05-15 DIAGNOSIS — Z5112 Encounter for antineoplastic immunotherapy: Secondary | ICD-10-CM | POA: Diagnosis not present

## 2020-05-15 MED ORDER — HEPARIN SOD (PORK) LOCK FLUSH 100 UNIT/ML IV SOLN
500.0000 [IU] | Freq: Once | INTRAVENOUS | Status: AC | PRN
  Administered 2020-05-15: 500 [IU]

## 2020-05-15 MED ORDER — DIPHENHYDRAMINE HCL 25 MG PO CAPS
25.0000 mg | ORAL_CAPSULE | Freq: Once | ORAL | Status: AC
  Administered 2020-05-15: 25 mg via ORAL
  Filled 2020-05-15: qty 1

## 2020-05-15 MED ORDER — ACETAMINOPHEN 325 MG PO TABS
650.0000 mg | ORAL_TABLET | Freq: Once | ORAL | Status: AC
  Administered 2020-05-15: 650 mg via ORAL
  Filled 2020-05-15: qty 2

## 2020-05-15 MED ORDER — SODIUM CHLORIDE 0.9 % IV SOLN
420.0000 mg | Freq: Once | INTRAVENOUS | Status: AC
  Administered 2020-05-15: 420 mg via INTRAVENOUS
  Filled 2020-05-15: qty 14

## 2020-05-15 MED ORDER — SODIUM CHLORIDE 0.9 % IV SOLN: Freq: Once | INTRAVENOUS | Status: AC

## 2020-05-15 MED ORDER — SODIUM CHLORIDE 0.9% FLUSH
10.0000 mL | INTRAVENOUS | Status: DC | PRN
  Administered 2020-05-15: 10 mL

## 2020-05-15 MED ORDER — TRASTUZUMAB-DKST CHEMO 150 MG IV SOLR
6.0000 mg/kg | Freq: Once | INTRAVENOUS | Status: AC
  Administered 2020-05-15: 420 mg via INTRAVENOUS
  Filled 2020-05-15: qty 20

## 2020-05-15 NOTE — Progress Notes (Signed)
Patient tolerated chemotherapy with no complaints voiced.  Side effects with management reviewed with understanding verbalized.  Port site clean and dry with no bruising or swelling noted at site.  Good blood return noted before and after administration of chemotherapy.  Band aid applied.  Patient left ambulatory with VSS and no s/s of distress noted.  

## 2020-05-19 ENCOUNTER — Ambulatory Visit (INDEPENDENT_AMBULATORY_CARE_PROVIDER_SITE_OTHER): Payer: Medicaid Other | Admitting: Obstetrics and Gynecology

## 2020-05-19 ENCOUNTER — Encounter: Payer: Self-pay | Admitting: Obstetrics and Gynecology

## 2020-05-19 ENCOUNTER — Other Ambulatory Visit: Payer: Self-pay

## 2020-05-19 VITALS — BP 113/80 | HR 93 | Ht <= 58 in | Wt 153.0 lb

## 2020-05-19 DIAGNOSIS — Z90722 Acquired absence of ovaries, bilateral: Secondary | ICD-10-CM

## 2020-05-19 DIAGNOSIS — Z09 Encounter for follow-up examination after completed treatment for conditions other than malignant neoplasm: Secondary | ICD-10-CM

## 2020-05-19 DIAGNOSIS — Z48816 Encounter for surgical aftercare following surgery on the genitourinary system: Secondary | ICD-10-CM

## 2020-05-19 DIAGNOSIS — Z9079 Acquired absence of other genital organ(s): Secondary | ICD-10-CM

## 2020-05-19 DIAGNOSIS — Z90711 Acquired absence of uterus with remaining cervical stump: Secondary | ICD-10-CM

## 2020-05-19 NOTE — Progress Notes (Signed)
  Subjective:  Monica Hancock is a 39 y.o. female now 5 weeks status post supracervical hysterectomy, bilateral oophorectomy and bilateral salpingectomy.   She notes that she is now starting to be able to feel some of the area around her surgical scar.   Review of Systems Negative except for acne on face   Diet:   normal   Bowel movements : normal.  The patient is not having any pain.  Objective:  LMP 09/29/2019  General:Well developed, well nourished.  No acute distress. Abdomen: Bowel sounds normal, soft, non-tender.  Incision(s): Healing well, no drainage, no erythema, no hernia, no swelling, no dehiscence,   Assessment:  Post-Op 5 weeks s/p supracervical hysterectomy   Excellent recovery postoperatively.   Plan:  1.Wound care discussed  Aloe, neosporin 2. Current medications.reviewed 3. Activity restrictions: none 4. return to work: 1-2 weeks. 5. Follow up PRN

## 2020-06-05 ENCOUNTER — Inpatient Hospital Stay (HOSPITAL_BASED_OUTPATIENT_CLINIC_OR_DEPARTMENT_OTHER): Payer: Medicaid Other | Admitting: Nurse Practitioner

## 2020-06-05 ENCOUNTER — Inpatient Hospital Stay (HOSPITAL_COMMUNITY): Payer: Medicaid Other

## 2020-06-05 ENCOUNTER — Other Ambulatory Visit: Payer: Self-pay

## 2020-06-05 ENCOUNTER — Inpatient Hospital Stay (HOSPITAL_COMMUNITY): Payer: Medicaid Other | Attending: Hematology

## 2020-06-05 VITALS — BP 121/68 | HR 64 | Temp 98.2°F | Resp 18

## 2020-06-05 DIAGNOSIS — E876 Hypokalemia: Secondary | ICD-10-CM | POA: Insufficient documentation

## 2020-06-05 DIAGNOSIS — Z170 Estrogen receptor positive status [ER+]: Secondary | ICD-10-CM | POA: Insufficient documentation

## 2020-06-05 DIAGNOSIS — C50411 Malignant neoplasm of upper-outer quadrant of right female breast: Secondary | ICD-10-CM | POA: Diagnosis not present

## 2020-06-05 DIAGNOSIS — Z9221 Personal history of antineoplastic chemotherapy: Secondary | ICD-10-CM | POA: Insufficient documentation

## 2020-06-05 DIAGNOSIS — Z79811 Long term (current) use of aromatase inhibitors: Secondary | ICD-10-CM | POA: Diagnosis not present

## 2020-06-05 DIAGNOSIS — Z5112 Encounter for antineoplastic immunotherapy: Secondary | ICD-10-CM | POA: Insufficient documentation

## 2020-06-05 DIAGNOSIS — Z79899 Other long term (current) drug therapy: Secondary | ICD-10-CM | POA: Diagnosis not present

## 2020-06-05 DIAGNOSIS — Z791 Long term (current) use of non-steroidal anti-inflammatories (NSAID): Secondary | ICD-10-CM | POA: Insufficient documentation

## 2020-06-05 DIAGNOSIS — Z9013 Acquired absence of bilateral breasts and nipples: Secondary | ICD-10-CM | POA: Insufficient documentation

## 2020-06-05 LAB — CBC WITH DIFFERENTIAL/PLATELET
Abs Immature Granulocytes: 0.02 10*3/uL (ref 0.00–0.07)
Basophils Absolute: 0 10*3/uL (ref 0.0–0.1)
Basophils Relative: 1 %
Eosinophils Absolute: 0.1 10*3/uL (ref 0.0–0.5)
Eosinophils Relative: 2 %
HCT: 38.8 % (ref 36.0–46.0)
Hemoglobin: 12.6 g/dL (ref 12.0–15.0)
Immature Granulocytes: 0 %
Lymphocytes Relative: 44 %
Lymphs Abs: 2.6 10*3/uL (ref 0.7–4.0)
MCH: 30.1 pg (ref 26.0–34.0)
MCHC: 32.5 g/dL (ref 30.0–36.0)
MCV: 92.8 fL (ref 80.0–100.0)
Monocytes Absolute: 0.3 10*3/uL (ref 0.1–1.0)
Monocytes Relative: 6 %
Neutro Abs: 2.9 10*3/uL (ref 1.7–7.7)
Neutrophils Relative %: 47 %
Platelets: 304 10*3/uL (ref 150–400)
RBC: 4.18 MIL/uL (ref 3.87–5.11)
RDW: 12.7 % (ref 11.5–15.5)
WBC: 6 10*3/uL (ref 4.0–10.5)
nRBC: 0 % (ref 0.0–0.2)

## 2020-06-05 LAB — COMPREHENSIVE METABOLIC PANEL
ALT: 26 U/L (ref 0–44)
AST: 18 U/L (ref 15–41)
Albumin: 3.9 g/dL (ref 3.5–5.0)
Alkaline Phosphatase: 82 U/L (ref 38–126)
Anion gap: 9 (ref 5–15)
BUN: 13 mg/dL (ref 6–20)
CO2: 26 mmol/L (ref 22–32)
Calcium: 10.2 mg/dL (ref 8.9–10.3)
Chloride: 104 mmol/L (ref 98–111)
Creatinine, Ser: 0.57 mg/dL (ref 0.44–1.00)
GFR calc Af Amer: >60 mL/min (ref >60)
GFR calc non Af Amer: >60 mL/min (ref >60)
Glucose, Bld: 110 mg/dL — ABNORMAL HIGH (ref 70–99)
Potassium: 3.8 mmol/L (ref 3.5–5.1)
Sodium: 139 mmol/L (ref 135–145)
Total Bilirubin: 0.6 mg/dL (ref 0.3–1.2)
Total Protein: 7.6 g/dL (ref 6.5–8.1)

## 2020-06-05 MED ORDER — HEPARIN SOD (PORK) LOCK FLUSH 100 UNIT/ML IV SOLN
500.0000 [IU] | Freq: Once | INTRAVENOUS | Status: AC | PRN
  Administered 2020-06-05: 500 [IU]

## 2020-06-05 MED ORDER — SODIUM CHLORIDE 0.9 % IV SOLN
420.0000 mg | Freq: Once | INTRAVENOUS | Status: AC
  Administered 2020-06-05: 420 mg via INTRAVENOUS
  Filled 2020-06-05: qty 14

## 2020-06-05 MED ORDER — DIPHENHYDRAMINE HCL 25 MG PO CAPS
ORAL_CAPSULE | ORAL | Status: AC
  Filled 2020-06-05: qty 1

## 2020-06-05 MED ORDER — DIPHENHYDRAMINE HCL 25 MG PO CAPS
25.0000 mg | ORAL_CAPSULE | Freq: Once | ORAL | Status: AC
  Administered 2020-06-05: 25 mg via ORAL

## 2020-06-05 MED ORDER — ACETAMINOPHEN 325 MG PO TABS
650.0000 mg | ORAL_TABLET | Freq: Once | ORAL | Status: AC
  Administered 2020-06-05: 650 mg via ORAL

## 2020-06-05 MED ORDER — SODIUM CHLORIDE 0.9 % IV SOLN: Freq: Once | INTRAVENOUS | Status: AC

## 2020-06-05 MED ORDER — SODIUM CHLORIDE 0.9% FLUSH
10.0000 mL | INTRAVENOUS | Status: DC | PRN
  Administered 2020-06-05: 10 mL

## 2020-06-05 MED ORDER — ACETAMINOPHEN 325 MG PO TABS
ORAL_TABLET | ORAL | Status: AC
  Filled 2020-06-05: qty 2

## 2020-06-05 MED ORDER — TRASTUZUMAB-DKST CHEMO 150 MG IV SOLR
6.0000 mg/kg | Freq: Once | INTRAVENOUS | Status: AC
  Administered 2020-06-05: 420 mg via INTRAVENOUS
  Filled 2020-06-05: qty 20

## 2020-06-05 NOTE — Progress Notes (Signed)
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 [facility])   08/27/2019 - 12/30/2019 Chemotherapy   The patient had palonosetron (ALOXI) injection 0.25 mg, 0.25 mg, Intravenous,  Once, 6 of 6 cycles Administration: 0.25 mg (08/27/2019), 0.25 mg (09/17/2019), 0.25 mg (11/19/2019), 0.25 mg (12/10/2019), 0.25 mg (10/08/2019), 0.25 mg (10/29/2019) pegfilgrastim-jmdb (FULPHILA) injection 6 mg, 6 mg, Subcutaneous,  Once, 6 of 6 cycles Administration: 6 mg (08/29/2019), 6 mg (09/19/2019), 6 mg (11/21/2019), 6 mg (12/12/2019), 6 mg (10/10/2019), 6 mg (10/31/2019) trastuzumab (HERCEPTIN) 600 mg in sodium chloride 0.9 % 250 mL chemo infusion, 567 mg (100 % of original dose 8 mg/kg), Intravenous,  Once, 1 of 1 cycle Dose modification: 8 mg/kg (original dose 8 mg/kg, Cycle 1, Reason: Other (see comments), Comment: using up last of our Herceptin at [facility]) Administration: 600 mg (08/27/2019) CARBOplatin (PARAPLATIN) 780 mg in sodium chloride 0.9 % 250 mL chemo infusion, 780 mg (100 % of original dose 784.8 mg), Intravenous,  Once, 6 of 6 cycles Dose modification:   (original dose 784.8 mg, Cycle 1),   (original dose 784.8 mg, Cycle 2),   (original dose 784.8 mg, Cycle 5), 784.8 mg (original dose 784.8 mg, Cycle 5, Reason: Other (see comments), Comment: scr 0.8 entered),   (original dose 784.8 mg, Cycle 6),   (original dose 784.8 mg, Cycle 3),   (original dose 784.8 mg, Cycle 4) Administration: 780 mg (08/27/2019), 780 mg (09/17/2019), 780 mg  (11/19/2019), 780 mg (12/10/2019), 780 mg (10/08/2019), 780 mg (10/29/2019) DOCEtaxel (TAXOTERE) 130 mg in sodium chloride 0.9 % 250 mL chemo infusion, 75 mg/m2 = 130 mg, Intravenous,  Once, 6 of 6 cycles Administration: 130 mg (08/27/2019), 130 mg (09/17/2019), 130 mg (11/19/2019), 130 mg (12/10/2019), 130 mg (10/08/2019), 130 mg (10/29/2019) pertuzumab (PERJETA) 840 mg in sodium chloride 0.9 % 250 mL chemo infusion, 840 mg, Intravenous, Once, 6 of 6 cycles Administration: 840 mg (08/27/2019), 420 mg (09/17/2019), 420 mg (11/19/2019), 420 mg (12/10/2019), 420 mg (10/08/2019), 420 mg (10/29/2019) fosaprepitant (EMEND) 150 mg, dexamethasone (DECADRON) 12 mg in sodium chloride 0.9 % 145 mL IVPB, , Intravenous,  Once, 6 of 6 cycles Administration:  (08/27/2019),  (09/17/2019),  (11/19/2019),  (12/10/2019),  (10/08/2019),  (10/29/2019) trastuzumab-dkst (OGIVRI) 450 mg in sodium chloride 0.9 % 250 mL chemo infusion, 420 mg, Intravenous,  Once, 5 of 5 cycles Administration: 450 mg (09/17/2019), 450 mg (11/19/2019), 420 mg (12/10/2019), 450 mg (10/08/2019), 450 mg (10/29/2019)  for chemotherapy treatment.    01/02/2020 -  Chemotherapy   The patient had trastuzumab (HERCEPTIN) 525 mg in sodium chloride 0.9 % 250 mL chemo infusion, 8 mg/kg = 525 mg (100 % of original dose 8 mg/kg), Intravenous,  Once, 1 of 1 cycle Dose modification: 8 mg/kg (original dose 8 mg/kg, Cycle 6, Reason: Provider Judgment, Comment: Reload per MD due to delay in 1 week from last tx) Administration: 525 mg (04/24/2020) pertuzumab (PERJETA) 420 mg in sodium chloride 0.9 % 250 mL chemo infusion, 420 mg (100 % of original dose 420 mg), Intravenous, Once, 7 of 12  cycles Dose modification: 420 mg (original dose 420 mg, Cycle 1, Reason: Provider Judgment) Administration: 420 mg (01/02/2020), 420 mg (01/23/2020), 420 mg (03/26/2020), 420 mg (04/24/2020), 420 mg (05/15/2020), 420 mg (02/13/2020), 420 mg (03/05/2020) trastuzumab-dkst (OGIVRI) 450 mg in sodium  chloride 0.9 % 250 mL chemo infusion, 420 mg (100 % of original dose 6 mg/kg), Intravenous,  Once, 7 of 12 cycles Dose modification: 6 mg/kg (original dose 6 mg/kg, Cycle 1, Reason: Provider Judgment) Administration: 450 mg (01/02/2020), 420 mg (01/23/2020), 420 mg (03/26/2020), 420 mg (05/15/2020), 420 mg (02/13/2020), 420 mg (03/05/2020)  for chemotherapy treatment.      INTERVAL HISTORY:  Monica Hancock 39 y.o. female returns for routine follow-up for breast cancer.  Patient reports she is doing well since her last visit.  She is tolerating her treatment very well.  She is recovering from surgery fine.  She is taking her anastrozole as prescribed with no unwanted side effects. Denies any nausea, vomiting, or diarrhea. Denies any new pains. Had not noticed any recent bleeding such as epistaxis, hematuria or hematochezia. Denies recent chest pain on exertion, shortness of breath on minimal exertion, pre-syncopal episodes, or palpitations. Denies any numbness or tingling in hands or feet. Denies any recent fevers, infections, or recent hospitalizations. Patient reports appetite at 75% and energy level at 75%.  She is eating well maintain her weight at this time.     REVIEW OF SYSTEMS:  Review of Systems  Constitutional: Positive for fatigue.  Psychiatric/Behavioral: Positive for sleep disturbance.  All other systems reviewed and are negative.    PAST MEDICAL/SURGICAL HISTORY:  Past Medical History:  Diagnosis Date   BRCA1 positive    Cancer ([place])    Right Breast   Family history of adverse reaction to anesthesia    PONV-children   Family history of BRCA1 gene positive    Family history of breast cancer    H/O Bell's palsy 2000   Left sided   HSV infection    Past Surgical History:  Procedure Laterality Date   BREAST BIOPSY Right    IR IMAGING GUIDED PORT INSERTION  08/26/2019   MASTECTOMY MODIFIED RADICAL Right 01/24/2020   Procedure: RIGHT MASTECTOMY MODIFIED RADICAL;  Surgeon:  Aviva Signs, MD;  Location: [facility];  Service: General;  Laterality: Right;   SALPINGOOPHORECTOMY Bilateral 04/14/2020   Procedure: OPEN BILATERAL SALPINGO OOPHORECTOMY;  Surgeon: [name], MD;  Location: [facility];  Service: Gynecology;  Laterality: Bilateral;   SIMPLE MASTECTOMY WITH AXILLARY SENTINEL NODE BIOPSY Left 01/24/2020   Procedure: LEFT SIMPLE MASTECTOMY;  Surgeon: Aviva Signs, MD;  Location: [facility];  Service: General;  Laterality: Left;   SUPRACERVICAL ABDOMINAL HYSTERECTOMY N/A 04/14/2020   Procedure: HYSTERECTOMY SUPRACERVICAL ABDOMINAL;  Surgeon: [name], MD;  Location: [facility];  Service: Gynecology;  Laterality: N/A;   TUBAL LIGATION       SOCIAL HISTORY:  Social History   Socioeconomic History   Marital status: Married    Spouse name: [name]   Number of children: 5   Years of education: Not on file   Highest education level: 12th grade  Occupational History   Not on file  Tobacco Use   Smoking status: Never Smoker   Smokeless tobacco: Never Used  Vaping Use   Vaping Use: Never used  Substance and Sexual Activity   Alcohol use: No   Drug use: No   Sexual activity: Not Currently    Birth control/protection: Surgical    Comment: [facility]  Other Topics  Concern   Not on file  Social History Narrative   Not on file   Social Determinants of Health   Financial Resource Strain: Medium Risk   Difficulty of Paying Living Expenses: Somewhat hard  Food Insecurity: Food Insecurity Present   Worried About Running Out of Food in the Last Year: Sometimes true   Ran Out of Food in the Last Year: Sometimes true  Transportation Needs: No Transportation Needs   Lack of Transportation (Medical): No   Lack of Transportation (Non-Medical): No  Physical Activity: Inactive   Days of Exercise per Week: 0 days   Minutes of Exercise per Session: 0 min  Stress: No Stress Concern Present   Feeling of Stress : Only a little  Social Connections:  Engineer, building services of Communication with Friends and Family: More than three times a week   Frequency of Social Gatherings with Friends and Family: More than three times a week   Attends Religious Services: More than 4 times per year   Active Member of [employer] or Organizations: Yes   Attends Music therapist: More than 4 times per year   Marital Status: Married  Human resources officer Violence: Not At Risk   Fear of Current or Ex-Partner: No   Emotionally Abused: No   Physically Abused: No   Sexually Abused: No    FAMILY HISTORY:  Family History  Problem Relation Age of Onset   Diabetes Mother    Breast cancer Mother 46   Cancer Mother    Diabetes Maternal Grandmother    Stomach cancer Maternal Grandfather    Breast cancer Maternal Aunt        dx in her 68s   Diabetes Maternal Aunt     CURRENT MEDICATIONS:  Outpatient Encounter Medications as of 06/05/2020  Medication Sig Note   diphenhydrAMINE (BENADRYL) 50 MG tablet Take 50 mg by mouth every 8 (eight) hours as needed for allergies.     ferrous sulfate (FERROUSUL) 325 (65 FE) MG tablet Take 1 tablet (325 mg total) by mouth daily with breakfast.    MAGNESIUM-OXIDE 400 (241.3 Mg) MG tablet TAKE 1 TABLET(400 MG) BY MOUTH TWICE DAILY    pertuzumab in sodium chloride 0.9 % 250 mL Inject into the vein every 21 ( twenty-one) days. 03/27/2020: Last infusion:03/26/2020   potassium chloride SA (KLOR-CON) 10 MEQ tablet Take 2 tablets (20 mEq total) by mouth 2 (two) times daily.    trastuzumab-dkst 2 mg/kg in sodium chloride 0.9 % 250 mL Inject into the vein every 21 ( twenty-one) days. 03/27/2020: Last infusion:03/26/2020   acetaminophen (TYLENOL) 325 MG tablet Take 650 mg by mouth every 6 (six) hours as needed. (Patient not taking: Reported on 06/05/2020)    anastrozole (ARIMIDEX) 1 MG tablet Take 1 mg by mouth daily. (Patient not taking: Reported on 06/05/2020)    ibuprofen (ADVIL) 600 MG tablet Take  1 tablet (600 mg total) by mouth every 6 (six) hours as needed for fever or headache. (Patient not taking: Reported on 06/05/2020)    lidocaine-prilocaine (EMLA) cream Apply 1 application topically See admin instructions. Apply small amount to port a cath and cover with plastic wrap 1 hour prior to chemotherapy (Patient not taking: Reported on 06/05/2020)    loratadine (CLARITIN) 10 MG tablet Take 10 mg by mouth daily as needed for allergies.  (Patient not taking: Reported on 06/05/2020)    oxyCODONE (OXY IR/ROXICODONE) 5 MG immediate release tablet Take 1-2 tablets (5-10 mg total) by mouth every  4 (four) hours as needed for moderate pain. (Patient not taking: Reported on 05/19/2020)    prochlorperazine (COMPAZINE) 10 MG tablet Take 1 tablet (10 mg total) by mouth every 6 (six) hours as needed for nausea or vomiting. (Patient not taking: Reported on 06/05/2020)    No facility-administered encounter medications on file as of 06/05/2020.    ALLERGIES:  No Known Allergies   PHYSICAL EXAM:  ECOG Performance status: 1  Vitals:   06/05/20 0904  BP: 111/72  Pulse: 63  Resp: 18  Temp: 98.2 F (36.8 C)  SpO2: 100%   Filed Weights   06/05/20 0904  Weight: 155 lb 6.4 oz (70.5 kg)   Physical Exam Constitutional:      Appearance: Normal appearance. She is normal weight.  Cardiovascular:     Rate and Rhythm: Normal rate and regular rhythm.     Heart sounds: Normal heart sounds.  Pulmonary:     Effort: Pulmonary effort is normal.     Breath sounds: Normal breath sounds.  Abdominal:     General: Bowel sounds are normal.     Palpations: Abdomen is soft.  Musculoskeletal:        General: Normal range of motion.  Skin:    General: Skin is warm.  Neurological:     Mental Status: She is alert and oriented to person, place, and time. Mental status is at baseline.  Psychiatric:        Mood and Affect: Mood normal.        Behavior: Behavior normal.        Thought Content: Thought content  normal.        Judgment: Judgment normal.      LABORATORY DATA:  I have reviewed the labs as listed.  CBC    Component Value Date/Time   WBC 6.0 06/05/2020 1019   RBC 4.18 06/05/2020 1019   HGB 12.6 06/05/2020 1019   HCT 38.8 06/05/2020 1019   PLT 304 06/05/2020 1019   MCV 92.8 06/05/2020 1019   MCH 30.1 06/05/2020 1019   MCHC 32.5 06/05/2020 1019   RDW 12.7 06/05/2020 1019   LYMPHSABS 2.6 06/05/2020 1019   MONOABS 0.3 06/05/2020 1019   EOSABS 0.1 06/05/2020 1019   BASOSABS 0.0 06/05/2020 1019   CMP Latest Ref Rng & Units 06/05/2020 04/24/2020 04/15/2020  Glucose 70 - 99 mg/dL 110(H) 135(H) 142(H)  BUN 6 - 20 mg/dL 13 15 9   Creatinine 0.44 - 1.00 mg/dL 0.57 0.73 0.57  Sodium 135 - 145 mmol/L 139 137 136  Potassium 3.5 - 5.1 mmol/L 3.8 3.2(L) 4.4  Chloride 98 - 111 mmol/L 104 96(L) 101  CO2 22 - 32 mmol/L 26 25 25   Calcium 8.9 - 10.3 mg/dL 10.2 10.2 9.8  Total Protein 6.5 - 8.1 g/dL 7.6 8.2(H) -  Total Bilirubin 0.3 - 1.2 mg/dL 0.6 0.8 -  Alkaline Phos 38 - 126 U/L 82 96 -  AST 15 - 41 U/L 18 19 -  ALT 0 - 44 U/L 26 22 -    All questions were answered to patient's stated satisfaction. Encouraged patient to call with any new concerns or questions before his next visit to the cancer center and we can certain see him sooner, if needed.     ASSESSMENT & PLAN:  Malignant neoplasm of upper-outer quadrant of right female breast ([place]) 1.  Clinical T2N0 right breast IDC: -ER positive, HER-2 positive by FISH. -6 cycles of neoadjuvant TCHP from 08/27/2019 through 12/10/2019. -Herceptin and Pertuzumab  maintenance started on 01/02/2020. -Bilateral mastectomies on 01/24/2020.  Pathology showed YPT0YPN0, 0/6 lymph nodes involved. -She met with Dr. Sondra Come who did not recommend adjuvant radiation. -TAH/BSO on 04/14/2020. -Anastrozole started on 04/24/2020. -She will continue with Herceptin and Pertuzumab every 3 weeks.  She is tolerating well. -Labs done on 06/05/2020 were all WNL.  2.   BRCA1 positive: -Bilateral mastectomies on 01/24/2020. -TAH/BSO on 04/14/2020 recovering well from surgery.  3.  Hypokalemia: -Continue potassium 20 mEq twice daily. -Labs done on 06/05/2020 showed potassium 3.8  4.  Hypomagnesemia: -Magnesium twice daily -We will check at her next visit.  5.  High risk drug monitoring: -Echo on 04/08/2020 showed EF of 60 to 65%. -No symptoms of PND or orthopnea     Orders placed this encounter:  Orders Placed This Encounter  Procedures   Lactate dehydrogenase   Magnesium   CBC with Differential/Platelet   Comprehensive metabolic panel   Lactate dehydrogenase   Magnesium   CBC with Differential/Platelet   Comprehensive metabolic panel      Francene Finders, FNP-C [place] [phone]

## 2020-06-05 NOTE — Patient Instructions (Signed)
[facility] Discharge Instructions for Patients Receiving Chemotherapy  Today you received the following chemotherapy agents   To help prevent nausea and vomiting after your treatment, we encourage you to take your nausea medication   If you develop nausea and vomiting that is not controlled by your nausea medication, call the clinic.   BELOW ARE SYMPTOMS THAT SHOULD BE REPORTED IMMEDIATELY:  *FEVER GREATER THAN 100.5 F  *CHILLS WITH OR WITHOUT FEVER  NAUSEA AND VOMITING THAT IS NOT CONTROLLED WITH YOUR NAUSEA MEDICATION  *UNUSUAL SHORTNESS OF BREATH  *UNUSUAL BRUISING OR BLEEDING  TENDERNESS IN MOUTH AND THROAT WITH OR WITHOUT PRESENCE OF ULCERS  *URINARY PROBLEMS  *BOWEL PROBLEMS  UNUSUAL RASH Items with * indicate a potential emergency and should be followed up as soon as possible.  Feel free to call the clinic should you have any questions or concerns. The clinic phone number is [phone].  Please show the CHEMO ALERT CARD at check-in to the Emergency Department and triage nurse.   

## 2020-06-05 NOTE — Assessment & Plan Note (Addendum)
1.  Clinical T2N0 right breast IDC: -ER positive, HER-2 positive by FISH. -6 cycles of neoadjuvant TCHP from 08/27/2019 through 12/10/2019. -Herceptin and Pertuzumab maintenance started on 01/02/2020. -Bilateral mastectomies on 01/24/2020.  Pathology showed YPT0YPN0, 0/6 lymph nodes involved. -She met with Dr. Sondra Come who did not recommend adjuvant radiation. -TAH/BSO on 04/14/2020. -Anastrozole started on 04/24/2020. -She will continue with Herceptin and Pertuzumab every 3 weeks.  She is tolerating well. -Labs done on 06/05/2020 were all WNL.  2.  BRCA1 positive: -Bilateral mastectomies on 01/24/2020. -TAH/BSO on 04/14/2020 recovering well from surgery.  3.  Hypokalemia: -Continue potassium 20 mEq twice daily. -Labs done on 06/05/2020 showed potassium 3.8  4.  Hypomagnesemia: -Magnesium twice daily -We will check at her next visit.  5.  High risk drug monitoring: -Echo on 04/08/2020 showed EF of 60 to 65%. -No symptoms of PND or orthopnea

## 2020-06-05 NOTE — Progress Notes (Signed)
Patient presents today for treatment and follow up visit with RLockamy NP. Vital signs are stable. Patient denies pain today. Labs reviewed by [facility] NP. Verbal order received to proceed with treatment.   Treatment given today per MD orders. Tolerated infusion without adverse affects. Vital signs stable. No complaints at this time. Discharged from clinic ambulatory. F/U with [facility] as scheduled.

## 2020-06-05 NOTE — Patient Instructions (Signed)
[facility] Discharge Instructions     Thank you for choosing [facility] at [facility] to provide your oncology and hematology care.  To afford each patient quality time with our provider, please arrive at least 15 minutes before your scheduled appointment time.   If you have a lab appointment with the Cancer Center please come in thru the Main Entrance and check in at the main information desk.  You need to re-schedule your appointment should you arrive 10 or more minutes late.  We strive to give you quality time with our providers, and arriving late affects you and other patients whose appointments are after yours.  Also, if you no show three or more times for appointments you may be dismissed from the clinic at the providers discretion.     Again, thank you for choosing [facility].  Our hope is that these requests will decrease the amount of time that you wait before being seen by our physicians.       _____________________________________________________________  Should you have questions after your visit to [facility], please contact our office at [phone] between the hours of 8:00 a.m. and 4:30 p.m.  Voicemails left after 4:00 p.m. will not be returned until the following business day.  For prescription refill requests, have your pharmacy contact our office and allow 72 hours.    Due to Covid, you will need to wear a mask upon entering the hospital. If you do not have a mask, a mask will be given to you at the Main Entrance upon arrival. For doctor visits, patients may have 1 support person with them. For treatment visits, patients can not have anyone with them due to social distancing guidelines and our immunocompromised population.      

## 2020-06-26 ENCOUNTER — Inpatient Hospital Stay (HOSPITAL_COMMUNITY): Payer: Medicaid Other

## 2020-06-26 ENCOUNTER — Encounter (HOSPITAL_COMMUNITY): Payer: Self-pay

## 2020-06-26 ENCOUNTER — Other Ambulatory Visit: Payer: Self-pay

## 2020-06-26 ENCOUNTER — Inpatient Hospital Stay (HOSPITAL_COMMUNITY): Payer: Medicaid Other | Attending: Hematology

## 2020-06-26 VITALS — BP 128/85 | HR 65 | Temp 97.5°F | Resp 18

## 2020-06-26 DIAGNOSIS — Z9013 Acquired absence of bilateral breasts and nipples: Secondary | ICD-10-CM | POA: Insufficient documentation

## 2020-06-26 DIAGNOSIS — Z803 Family history of malignant neoplasm of breast: Secondary | ICD-10-CM | POA: Diagnosis not present

## 2020-06-26 DIAGNOSIS — R232 Flushing: Secondary | ICD-10-CM | POA: Diagnosis not present

## 2020-06-26 DIAGNOSIS — R21 Rash and other nonspecific skin eruption: Secondary | ICD-10-CM | POA: Diagnosis not present

## 2020-06-26 DIAGNOSIS — Z791 Long term (current) use of non-steroidal anti-inflammatories (NSAID): Secondary | ICD-10-CM | POA: Diagnosis not present

## 2020-06-26 DIAGNOSIS — Z9221 Personal history of antineoplastic chemotherapy: Secondary | ICD-10-CM | POA: Insufficient documentation

## 2020-06-26 DIAGNOSIS — R252 Cramp and spasm: Secondary | ICD-10-CM | POA: Insufficient documentation

## 2020-06-26 DIAGNOSIS — Z170 Estrogen receptor positive status [ER+]: Secondary | ICD-10-CM | POA: Diagnosis not present

## 2020-06-26 DIAGNOSIS — C50411 Malignant neoplasm of upper-outer quadrant of right female breast: Secondary | ICD-10-CM | POA: Insufficient documentation

## 2020-06-26 DIAGNOSIS — Z79899 Other long term (current) drug therapy: Secondary | ICD-10-CM | POA: Diagnosis not present

## 2020-06-26 DIAGNOSIS — Z5112 Encounter for antineoplastic immunotherapy: Secondary | ICD-10-CM | POA: Diagnosis not present

## 2020-06-26 DIAGNOSIS — Z79811 Long term (current) use of aromatase inhibitors: Secondary | ICD-10-CM | POA: Insufficient documentation

## 2020-06-26 DIAGNOSIS — E669 Obesity, unspecified: Secondary | ICD-10-CM | POA: Insufficient documentation

## 2020-06-26 DIAGNOSIS — E876 Hypokalemia: Secondary | ICD-10-CM | POA: Diagnosis not present

## 2020-06-26 LAB — COMPREHENSIVE METABOLIC PANEL
ALT: 28 U/L (ref 0–44)
AST: 19 U/L (ref 15–41)
Albumin: 4.2 g/dL (ref 3.5–5.0)
Alkaline Phosphatase: 81 U/L (ref 38–126)
Anion gap: 9 (ref 5–15)
BUN: 19 mg/dL (ref 6–20)
CO2: 25 mmol/L (ref 22–32)
Calcium: 10.2 mg/dL (ref 8.9–10.3)
Chloride: 104 mmol/L (ref 98–111)
Creatinine, Ser: 0.66 mg/dL (ref 0.44–1.00)
GFR calc Af Amer: >60 mL/min (ref >60)
GFR calc non Af Amer: >60 mL/min (ref >60)
Glucose, Bld: 104 mg/dL — ABNORMAL HIGH (ref 70–99)
Potassium: 3.9 mmol/L (ref 3.5–5.1)
Sodium: 138 mmol/L (ref 135–145)
Total Bilirubin: 0.6 mg/dL (ref 0.3–1.2)
Total Protein: 7.8 g/dL (ref 6.5–8.1)

## 2020-06-26 LAB — CBC WITH DIFFERENTIAL/PLATELET
Abs Immature Granulocytes: 0.02 10*3/uL (ref 0.00–0.07)
Basophils Absolute: 0 10*3/uL (ref 0.0–0.1)
Basophils Relative: 0 %
Eosinophils Absolute: 0.1 10*3/uL (ref 0.0–0.5)
Eosinophils Relative: 2 %
HCT: 40.3 % (ref 36.0–46.0)
Hemoglobin: 13.1 g/dL (ref 12.0–15.0)
Immature Granulocytes: 0 %
Lymphocytes Relative: 42 %
Lymphs Abs: 2.7 10*3/uL (ref 0.7–4.0)
MCH: 30 pg (ref 26.0–34.0)
MCHC: 32.5 g/dL (ref 30.0–36.0)
MCV: 92.4 fL (ref 80.0–100.0)
Monocytes Absolute: 0.4 10*3/uL (ref 0.1–1.0)
Monocytes Relative: 7 %
Neutro Abs: 3.2 10*3/uL (ref 1.7–7.7)
Neutrophils Relative %: 49 %
Platelets: 318 10*3/uL (ref 150–400)
RBC: 4.36 MIL/uL (ref 3.87–5.11)
RDW: 12.3 % (ref 11.5–15.5)
WBC: 6.5 10*3/uL (ref 4.0–10.5)
nRBC: 0 % (ref 0.0–0.2)

## 2020-06-26 LAB — MAGNESIUM: Magnesium: 2 mg/dL (ref 1.7–2.4)

## 2020-06-26 LAB — LACTATE DEHYDROGENASE: LDH: 118 U/L (ref 98–192)

## 2020-06-26 MED ORDER — HEPARIN SOD (PORK) LOCK FLUSH 100 UNIT/ML IV SOLN
500.0000 [IU] | Freq: Once | INTRAVENOUS | Status: AC | PRN
  Administered 2020-06-26: 500 [IU]

## 2020-06-26 MED ORDER — DIPHENHYDRAMINE HCL 25 MG PO CAPS
25.0000 mg | ORAL_CAPSULE | Freq: Once | ORAL | Status: AC
  Administered 2020-06-26: 25 mg via ORAL
  Filled 2020-06-26: qty 1

## 2020-06-26 MED ORDER — SODIUM CHLORIDE 0.9% FLUSH
10.0000 mL | INTRAVENOUS | Status: DC | PRN
  Administered 2020-06-26: 10 mL

## 2020-06-26 MED ORDER — TRASTUZUMAB-DKST CHEMO 150 MG IV SOLR
6.0000 mg/kg | Freq: Once | INTRAVENOUS | Status: AC
  Administered 2020-06-26: 420 mg via INTRAVENOUS
  Filled 2020-06-26: qty 20

## 2020-06-26 MED ORDER — SODIUM CHLORIDE 0.9 % IV SOLN: Freq: Once | INTRAVENOUS | Status: AC

## 2020-06-26 MED ORDER — ACETAMINOPHEN 325 MG PO TABS
650.0000 mg | ORAL_TABLET | Freq: Once | ORAL | Status: AC
  Administered 2020-06-26: 650 mg via ORAL
  Filled 2020-06-26: qty 2

## 2020-06-26 MED ORDER — SODIUM CHLORIDE 0.9 % IV SOLN
420.0000 mg | Freq: Once | INTRAVENOUS | Status: AC
  Administered 2020-06-26: 420 mg via INTRAVENOUS
  Filled 2020-06-26: qty 14

## 2020-06-26 NOTE — Progress Notes (Signed)
Patient presents today for treatment. Vital signs within parameters for treatment. Patient denies pain today. Patient denies any significant changes. Patient has complaints of a rash on her left leg, neck, trunk and face. Patient denies any itching associated with rash. Patient requesting the nurse practitioner to assess.  Labs pending. Last echo 04/08/20.   RLockamy NP at the bedside.   Treatment given today per MD orders. Tolerated infusion without adverse affects. Vital signs stable. No complaints at this time. Discharged from clinic ambulatory. F/U with [facility] as scheduled.

## 2020-06-26 NOTE — Patient Instructions (Signed)
[facility] Discharge Instructions for Patients Receiving Chemotherapy  Today you received the following chemotherapy agents   To help prevent nausea and vomiting after your treatment, we encourage you to take your nausea medication   If you develop nausea and vomiting that is not controlled by your nausea medication, call the clinic.   BELOW ARE SYMPTOMS THAT SHOULD BE REPORTED IMMEDIATELY:  *FEVER GREATER THAN 100.5 F  *CHILLS WITH OR WITHOUT FEVER  NAUSEA AND VOMITING THAT IS NOT CONTROLLED WITH YOUR NAUSEA MEDICATION  *UNUSUAL SHORTNESS OF BREATH  *UNUSUAL BRUISING OR BLEEDING  TENDERNESS IN MOUTH AND THROAT WITH OR WITHOUT PRESENCE OF ULCERS  *URINARY PROBLEMS  *BOWEL PROBLEMS  UNUSUAL RASH Items with * indicate a potential emergency and should be followed up as soon as possible.  Feel free to call the clinic should you have any questions or concerns. The clinic phone number is [phone].  Please show the CHEMO ALERT CARD at check-in to the Emergency Department and triage nurse.   

## 2020-07-17 ENCOUNTER — Inpatient Hospital Stay (HOSPITAL_COMMUNITY): Payer: Medicaid Other

## 2020-07-17 ENCOUNTER — Encounter (HOSPITAL_COMMUNITY): Payer: Self-pay

## 2020-07-17 ENCOUNTER — Other Ambulatory Visit: Payer: Self-pay

## 2020-07-17 DIAGNOSIS — Z170 Estrogen receptor positive status [ER+]: Secondary | ICD-10-CM

## 2020-07-17 DIAGNOSIS — Z5112 Encounter for antineoplastic immunotherapy: Secondary | ICD-10-CM | POA: Diagnosis not present

## 2020-07-17 LAB — COMPREHENSIVE METABOLIC PANEL
ALT: 30 U/L (ref 0–44)
AST: 20 U/L (ref 15–41)
Albumin: 4.1 g/dL (ref 3.5–5.0)
Alkaline Phosphatase: 78 U/L (ref 38–126)
Anion gap: 7 (ref 5–15)
BUN: 13 mg/dL (ref 6–20)
CO2: 25 mmol/L (ref 22–32)
Calcium: 10.1 mg/dL (ref 8.9–10.3)
Chloride: 105 mmol/L (ref 98–111)
Creatinine, Ser: 0.63 mg/dL (ref 0.44–1.00)
GFR calc Af Amer: >60 mL/min (ref >60)
GFR calc non Af Amer: >60 mL/min (ref >60)
Glucose, Bld: 113 mg/dL — ABNORMAL HIGH (ref 70–99)
Potassium: 3.7 mmol/L (ref 3.5–5.1)
Sodium: 137 mmol/L (ref 135–145)
Total Bilirubin: 0.8 mg/dL (ref 0.3–1.2)
Total Protein: 7.6 g/dL (ref 6.5–8.1)

## 2020-07-17 LAB — CBC WITH DIFFERENTIAL/PLATELET
Abs Immature Granulocytes: 0.01 10*3/uL (ref 0.00–0.07)
Basophils Absolute: 0 10*3/uL (ref 0.0–0.1)
Basophils Relative: 0 %
Eosinophils Absolute: 0.1 10*3/uL (ref 0.0–0.5)
Eosinophils Relative: 2 %
HCT: 41 % (ref 36.0–46.0)
Hemoglobin: 13.5 g/dL (ref 12.0–15.0)
Immature Granulocytes: 0 %
Lymphocytes Relative: 49 %
Lymphs Abs: 3 10*3/uL (ref 0.7–4.0)
MCH: 30.3 pg (ref 26.0–34.0)
MCHC: 32.9 g/dL (ref 30.0–36.0)
MCV: 91.9 fL (ref 80.0–100.0)
Monocytes Absolute: 0.4 10*3/uL (ref 0.1–1.0)
Monocytes Relative: 6 %
Neutro Abs: 2.6 10*3/uL (ref 1.7–7.7)
Neutrophils Relative %: 43 %
Platelets: 284 10*3/uL (ref 150–400)
RBC: 4.46 MIL/uL (ref 3.87–5.11)
RDW: 12.1 % (ref 11.5–15.5)
WBC: 6.1 10*3/uL (ref 4.0–10.5)
nRBC: 0 % (ref 0.0–0.2)

## 2020-07-17 LAB — LACTATE DEHYDROGENASE: LDH: 111 U/L (ref 98–192)

## 2020-07-17 LAB — MAGNESIUM: Magnesium: 1.9 mg/dL (ref 1.7–2.4)

## 2020-07-17 MED ORDER — HEPARIN SOD (PORK) LOCK FLUSH 100 UNIT/ML IV SOLN
500.0000 [IU] | Freq: Once | INTRAVENOUS | Status: AC
  Administered 2020-07-17: 500 [IU] via INTRAVENOUS

## 2020-07-17 MED ORDER — SODIUM CHLORIDE 0.9% FLUSH
10.0000 mL | INTRAVENOUS | Status: DC | PRN
  Administered 2020-07-17: 10 mL via INTRAVENOUS

## 2020-07-17 NOTE — Progress Notes (Signed)
Monica Hancock presented for Portacath access and flush.  Portacath located right chest wall accessed with  H 20 needle.  Good blood return present. Portacath flushed with 11ml NS and 500U/26ml Heparin and needle removed intact.  Procedure tolerated well and without incident.     Still having rash from previous visit. Spoke with R.Lockamay NP.  Restart anastrozole since still having rash since stopping medication. Will follow up with Dr Raliegh Ip on Monday.

## 2020-07-17 NOTE — Patient Instructions (Signed)
[facility]  Discharge Instructions:   _______________________________________________________________  Thank you for choosing [facility] to provide your oncology and hematology care.  To afford each patient quality time with our providers, please arrive at least 15 minutes before your scheduled appointment.  You need to re-schedule your appointment if you arrive 10 or more minutes late.  We strive to give you quality time with our providers, and arriving late affects you and other patients whose appointments are after yours.  Also, if you no show three or more times for appointments you may be dismissed from the clinic.  Again, thank you for choosing [facility]. Our hope is that these requests will allow you access to exceptional care and in a timely manner. _______________________________________________________________  If you have questions after your visit, please contact our office at [phone] between the hours of 8:30 a.m. and 5:00 p.m. Voicemails left after 4:30 p.m. will not be returned until the following business day. _______________________________________________________________  For prescription refill requests, have your pharmacy contact our office. _______________________________________________________________  Recommendations made by the consultant and any test results will be sent to your referring physician. _______________________________________________________________ 

## 2020-07-20 ENCOUNTER — Inpatient Hospital Stay (HOSPITAL_COMMUNITY): Payer: Medicaid Other

## 2020-07-20 ENCOUNTER — Encounter (HOSPITAL_COMMUNITY): Payer: Self-pay | Admitting: Hematology

## 2020-07-20 ENCOUNTER — Inpatient Hospital Stay (HOSPITAL_BASED_OUTPATIENT_CLINIC_OR_DEPARTMENT_OTHER): Payer: Medicaid Other | Admitting: Hematology

## 2020-07-20 ENCOUNTER — Other Ambulatory Visit: Payer: Self-pay

## 2020-07-20 VITALS — BP 116/67 | HR 73 | Resp 16

## 2020-07-20 VITALS — BP 105/76 | HR 66 | Temp 98.7°F | Resp 18 | Wt 154.4 lb

## 2020-07-20 DIAGNOSIS — C50411 Malignant neoplasm of upper-outer quadrant of right female breast: Secondary | ICD-10-CM

## 2020-07-20 DIAGNOSIS — Z5112 Encounter for antineoplastic immunotherapy: Secondary | ICD-10-CM | POA: Diagnosis not present

## 2020-07-20 DIAGNOSIS — Z170 Estrogen receptor positive status [ER+]: Secondary | ICD-10-CM

## 2020-07-20 MED ORDER — DIPHENHYDRAMINE HCL 25 MG PO CAPS
25.0000 mg | ORAL_CAPSULE | Freq: Once | ORAL | Status: AC
  Administered 2020-07-20: 25 mg via ORAL
  Filled 2020-07-20: qty 1

## 2020-07-20 MED ORDER — TRASTUZUMAB-DKST CHEMO 150 MG IV SOLR
6.0000 mg/kg | Freq: Once | INTRAVENOUS | Status: AC
  Administered 2020-07-20: 420 mg via INTRAVENOUS
  Filled 2020-07-20: qty 20

## 2020-07-20 MED ORDER — SODIUM CHLORIDE 0.9 % IV SOLN
420.0000 mg | Freq: Once | INTRAVENOUS | Status: AC
  Administered 2020-07-20: 420 mg via INTRAVENOUS
  Filled 2020-07-20: qty 14

## 2020-07-20 MED ORDER — SODIUM CHLORIDE 0.9% FLUSH
10.0000 mL | INTRAVENOUS | Status: DC | PRN
  Administered 2020-07-20: 10 mL

## 2020-07-20 MED ORDER — HEPARIN SOD (PORK) LOCK FLUSH 100 UNIT/ML IV SOLN
500.0000 [IU] | Freq: Once | INTRAVENOUS | Status: AC | PRN
  Administered 2020-07-20: 500 [IU]

## 2020-07-20 MED ORDER — SODIUM CHLORIDE 0.9 % IV SOLN: Freq: Once | INTRAVENOUS | Status: AC

## 2020-07-20 MED ORDER — ACETAMINOPHEN 325 MG PO TABS
650.0000 mg | ORAL_TABLET | Freq: Once | ORAL | Status: AC
  Administered 2020-07-20: 650 mg via ORAL
  Filled 2020-07-20: qty 2

## 2020-07-20 NOTE — Progress Notes (Signed)
[place], Monica Hancock [address]   CLINIC:  Medical Oncology/Hematology  PCP:  Monica Hancock, MD [place] / [place] [address] [phone]   REASON FOR VISIT:  Follow-up for HER-2 positive right breast cancer  PRIOR THERAPY:  1. Neoadjuvant TCHP x 6 cycles from 08/27/2019 to 12/10/2019. 2. Bilateral mastectomies on 01/24/2020.  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 Monica Hancock)   08/27/2019 - 12/30/2019 Chemotherapy   The patient had palonosetron (ALOXI) injection 0.25 mg, 0.25 mg, Intravenous,  Once, 6 of 6 cycles Administration: 0.25 mg (08/27/2019), 0.25 mg (09/17/2019), 0.25 mg (11/19/2019), 0.25 mg (12/10/2019), 0.25 mg (10/08/2019), 0.25 mg (10/29/2019) pegfilgrastim-jmdb (FULPHILA) injection 6 mg, 6 mg, Subcutaneous,  Once, 6 of 6 cycles Administration: 6 mg (08/29/2019), 6 mg (09/19/2019), 6 mg (11/21/2019), 6 mg (12/12/2019), 6 mg (10/10/2019), 6 mg (10/31/2019) trastuzumab (HERCEPTIN) 600 mg in sodium chloride 0.9 % 250 mL chemo infusion, 567 mg (100 % of original dose 8 mg/kg), Intravenous,  Once, 1 of 1 cycle Dose modification: 8 mg/kg (original dose 8 mg/kg, Cycle 1, Reason: Other (see comments), Comment: using up last of our Herceptin at [facility]) Administration: 600 mg (08/27/2019) CARBOplatin (PARAPLATIN) 780 mg in sodium chloride 0.9 % 250 mL chemo infusion, 780 mg (100 % of original dose 784.8 mg), Intravenous,  Once, 6 of 6 cycles Dose modification:   (original dose 784.8 mg, Cycle 1),   (original dose 784.8 mg, Cycle 2),   (original dose 784.8 mg, Cycle 5), 784.8 mg (original dose 784.8 mg, Cycle 5, Reason: Other (see comments), Comment: scr 0.8 entered),    (original dose 784.8 mg, Cycle 6),   (original dose 784.8 mg, Cycle 3),   (original dose 784.8 mg, Cycle 4) Administration: 780 mg (08/27/2019), 780 mg (09/17/2019), 780 mg (11/19/2019), 780 mg (12/10/2019), 780 mg (10/08/2019), 780 mg (10/29/2019) DOCEtaxel (TAXOTERE) 130 mg in sodium chloride 0.9 % 250 mL chemo infusion, 75 mg/m2 = 130 mg, Intravenous,  Once, 6 of 6 cycles Administration: 130 mg (08/27/2019), 130 mg (09/17/2019), 130 mg (11/19/2019), 130 mg (12/10/2019), 130 mg (10/08/2019), 130 mg (10/29/2019) pertuzumab (PERJETA) 840 mg in sodium chloride 0.9 % 250 mL chemo infusion, 840 mg, Intravenous, Once, 6 of 6 cycles Administration: 840 mg (08/27/2019), 420 mg (09/17/2019), 420 mg (11/19/2019), 420 mg (12/10/2019), 420 mg (10/08/2019), 420 mg (10/29/2019) fosaprepitant (EMEND) 150 mg, dexamethasone (DECADRON) 12 mg in sodium chloride 0.9 % 145 mL IVPB, , Intravenous,  Once, 6 of 6 cycles Administration:  (08/27/2019),  (09/17/2019),  (11/19/2019),  (12/10/2019),  (10/08/2019),  (10/29/2019) trastuzumab-dkst (OGIVRI) 450 mg in sodium chloride 0.9 % 250 mL chemo infusion, 420 mg, Intravenous,  Once, 5 of 5 cycles Administration: 450 mg (09/17/2019), 450 mg (11/19/2019), 420 mg (12/10/2019), 450 mg (10/08/2019), 450 mg (10/29/2019)  for chemotherapy treatment.    01/02/2020 -  Chemotherapy   The patient had trastuzumab (HERCEPTIN) 525 mg in sodium chloride 0.9 % 250 mL chemo infusion, 8 mg/kg = 525 mg (100 % of original dose 8 mg/kg), Intravenous,  Once, 1 of 1 cycle Dose modification: 8 mg/kg (original dose 8 mg/kg, Cycle 6, Reason: Provider Judgment, Comment: Reload per MD due to delay in 1 week from last tx) Administration: 525 mg (  04/24/2020) pertuzumab (PERJETA) 420 mg in sodium chloride 0.9 % 250 mL chemo infusion, 420 mg (100 % of original dose 420 mg), Intravenous, Once, 9 of 12 cycles Dose modification: 420 mg (original dose 420 mg, Cycle 1, Reason: Provider Judgment) Administration: 420 mg  (01/02/2020), 420 mg (01/23/2020), 420 mg (03/26/2020), 420 mg (04/24/2020), 420 mg (05/15/2020), 420 mg (06/05/2020), 420 mg (06/26/2020), 420 mg (02/13/2020), 420 mg (03/05/2020) trastuzumab-dkst (OGIVRI) 450 mg in sodium chloride 0.9 % 250 mL chemo infusion, 420 mg (100 % of original dose 6 mg/kg), Intravenous,  Once, 9 of 12 cycles Dose modification: 6 mg/kg (original dose 6 mg/kg, Cycle 1, Reason: Provider Judgment) Administration: 450 mg (01/02/2020), 420 mg (01/23/2020), 420 mg (03/26/2020), 420 mg (05/15/2020), 420 mg (06/05/2020), 420 mg (06/26/2020), 420 mg (02/13/2020), 420 mg (03/05/2020)  for chemotherapy treatment.      CANCER STAGING: Cancer Staging No matching staging information was found for the patient.  INTERVAL HISTORY:  Monica Hancock, a 39 y.o. female, returns for routine follow-up of her HER-2 positive right breast cancer. Monica Hancock was last seen on 04/24/2020.  Due for cycle #10 of pertuzumab and trastuzumab today.  Today she complains of having cramping throughout her entire body when she takes certain positions; the cramps last about 1-2 minutes. The cramps are not painful, just uncomfortable. The cramping has been ongoing for the past month. She complains of having hot flashes day and night, worse during the day, and occurring every 15-20 minutes during the day and once to twice at night. She also complains of having a non-itching rash on her face, arms and upper back; the rash started when she started taking the Arimidex. She tried stopping the Arimidex to see if the rash or cramping would improve, but they did not. She denies having dyspnea or SOB. She denies having joint or muscle pains.  Overall, she feels ready for her next chemo treatment.    REVIEW OF SYSTEMS:  Review of Systems  Constitutional: Negative for appetite change and fatigue.  Respiratory: Negative for shortness of breath.   Endocrine: Positive for hot flashes.  Musculoskeletal: Negative for arthralgias and  myalgias.  Skin: Positive for rash.  All other systems reviewed and are negative.   PAST MEDICAL/SURGICAL HISTORY:  Past Medical History:  Diagnosis Date  . BRCA1 positive   . Cancer Monica Hancock)    Right Breast  . Family history of adverse reaction to anesthesia    PONV-children  . Family history of BRCA1 gene positive   . Family history of breast cancer   . H/O Bell's palsy 2000   Left sided  . HSV infection    Past Surgical History:  Procedure Laterality Date  . BREAST BIOPSY Right   . IR IMAGING GUIDED PORT INSERTION  08/26/2019   Right  . MASTECTOMY MODIFIED RADICAL Right 01/24/2020   Procedure: RIGHT MASTECTOMY MODIFIED RADICAL;  Surgeon: Aviva Signs, MD;  Location: [facility];  Service: General;  Laterality: Right;  . SALPINGOOPHORECTOMY Bilateral 04/14/2020   Procedure: OPEN BILATERAL SALPINGO OOPHORECTOMY;  Surgeon: [name], MD;  Location: [facility];  Service: Gynecology;  Laterality: Bilateral;  . SIMPLE MASTECTOMY WITH AXILLARY SENTINEL NODE BIOPSY Left 01/24/2020   Procedure: LEFT SIMPLE MASTECTOMY;  Surgeon: Aviva Signs, MD;  Location: [facility];  Service: General;  Laterality: Left;  . SUPRACERVICAL ABDOMINAL HYSTERECTOMY N/A 04/14/2020   Procedure: HYSTERECTOMY SUPRACERVICAL ABDOMINAL;  Surgeon: [name], MD;  Location: [facility];  Service: Gynecology;  Laterality: N/A;  .  TUBAL LIGATION      SOCIAL HISTORY:  Social History   Socioeconomic History  . Marital status: Married    Spouse name: [name]  . Number of children: 5  . Years of education: Not on file  . Highest education level: 12th grade  Occupational History  . Not on file  Tobacco Use  . Smoking status: Never Smoker  . Smokeless tobacco: Never Used  Vaping Use  . Vaping Use: Never used  Substance and Sexual Activity  . Alcohol use: No  . Drug use: No  . Sexual activity: Not Currently    Birth control/protection: Surgical    Comment: [facility]  Other Topics Concern  . Not on file  Social History  Narrative  . Not on file   Social Determinants of Health   Financial Resource Strain: Medium Risk  . Difficulty of Paying Living Expenses: Somewhat hard  Food Insecurity: Food Insecurity Present  . Worried About Charity fundraiser in the Last Year: Sometimes true  . Ran Out of Food in the Last Year: Sometimes true  Transportation Needs: No Transportation Needs  . Lack of Transportation (Medical): No  . Lack of Transportation (Non-Medical): No  Physical Activity: Inactive  . Days of Exercise per Week: 0 days  . Minutes of Exercise per Session: 0 min  Stress: No Stress Concern Present  . Feeling of Stress : Only a little  Social Connections: Socially Integrated  . Frequency of Communication with Friends and Family: More than three times a week  . Frequency of Social Gatherings with Friends and Family: More than three times a week  . Attends Religious Services: More than 4 times per year  . Active Member of Clubs or Organizations: Yes  . Attends Archivist Meetings: More than 4 times per year  . Marital Status: Married  Human resources officer Violence: Not At Risk  . Fear of Current or Ex-Partner: No  . Emotionally Abused: No  . Physically Abused: No  . Sexually Abused: No    FAMILY HISTORY:  Family History  Problem Relation Age of Onset  . Diabetes Mother   . Breast cancer Mother 38  . Cancer Mother   . Diabetes Maternal Grandmother   . Stomach cancer Maternal Grandfather   . Breast cancer Maternal Aunt        dx in her 87s  . Diabetes Maternal Aunt     CURRENT MEDICATIONS:  Current Outpatient Medications  Medication Sig Dispense Refill  . acetaminophen (TYLENOL) 325 MG tablet Take 650 mg by mouth every 6 (six) hours as needed.     Marland Kitchen anastrozole (ARIMIDEX) 1 MG tablet Take 1 mg by mouth daily.     . diphenhydrAMINE (BENADRYL) 50 MG tablet Take 50 mg by mouth every 8 (eight) hours as needed for allergies.     . ferrous sulfate (FERROUSUL) 325 (65 FE) MG tablet  Take 1 tablet (325 mg total) by mouth daily with breakfast. 90 tablet 1  . ibuprofen (ADVIL) 600 MG tablet Take 1 tablet (600 mg total) by mouth every 6 (six) hours as needed for fever or headache. 30 tablet 0  . lidocaine-prilocaine (EMLA) cream Apply 1 application topically See admin instructions. Apply small amount to port a cath and cover with plastic wrap 1 hour prior to chemotherapy    . loratadine (CLARITIN) 10 MG tablet Take 10 mg by mouth daily as needed for allergies.     Marland Kitchen MAGNESIUM-OXIDE 400 (241.3 Mg) MG tablet TAKE 1  TABLET(400 MG) BY MOUTH TWICE DAILY 60 tablet 2  . oxyCODONE (OXY IR/ROXICODONE) 5 MG immediate release tablet Take 1-2 tablets (5-10 mg total) by mouth every 4 (four) hours as needed for moderate pain. 30 tablet 0  . pertuzumab in sodium chloride 0.9 % 250 mL Inject into the vein every 21 ( twenty-one) days.    . potassium chloride SA (KLOR-CON) 10 MEQ tablet Take 2 tablets (20 mEq total) by mouth 2 (two) times daily. 120 tablet 3  . prochlorperazine (COMPAZINE) 10 MG tablet Take 1 tablet (10 mg total) by mouth every 6 (six) hours as needed for nausea or vomiting. 30 tablet 3  . trastuzumab-dkst 2 mg/kg in sodium chloride 0.9 % 250 mL Inject into the vein every 21 ( twenty-one) days.     No current facility-administered medications for this visit.    ALLERGIES:  No Known Allergies  PHYSICAL EXAM:  Performance status (ECOG): 0 - Asymptomatic  Vitals:   07/20/20 1331  BP: 105/76  Pulse: 66  Resp: 18  Temp: 98.7 F (37.1 C)  SpO2: 99%   Wt Readings from Last 3 Encounters:  07/20/20 154 lb 6.4 oz (70 kg)  06/26/20 154 lb (69.9 kg)  06/05/20 155 lb 6.4 oz (70.5 kg)   Physical Exam Vitals reviewed.  Constitutional:      Appearance: Normal appearance. She is obese.  Cardiovascular:     Rate and Rhythm: Normal rate and regular rhythm.     Pulses: Normal pulses.     Heart sounds: Normal heart sounds.  Pulmonary:     Effort: Pulmonary effort is normal.      Breath sounds: Normal breath sounds.  Skin:    Findings: Rash present. Rash is papular and pustular (papulo-pustural rash over bilat face and forehead, R shoulder and upper back).  Neurological:     General: No focal deficit present.     Mental Status: She is alert and oriented to person, place, and time.  Psychiatric:        Mood and Affect: Mood normal.        Behavior: Behavior normal.      LABORATORY DATA:  I have reviewed the labs as listed.  CBC Latest Ref Rng & Units 07/17/2020 06/26/2020 06/05/2020  WBC 4.0 - 10.5 K/uL 6.1 6.5 6.0  Hemoglobin 12.0 - 15.0 g/dL 13.5 13.1 12.6  Hematocrit 36 - 46 % 41.0 40.3 38.8  Platelets 150 - 400 K/uL 284 318 304   CMP Latest Ref Rng & Units 07/17/2020 06/26/2020 06/05/2020  Glucose 70 - 99 mg/dL 113(H) 104(H) 110(H)  BUN 6 - 20 mg/dL _0 Creatinine 0.44 - 1.00 mg/dL 0.63 0.66 0.57  Sodium 135 - 145 mmol/L 137 138 139  Potassium 3.5 - 5.1 mmol/L 3.7 3.9 3.8  Chloride 98 - 111 mmol/L 105 104 104  CO2 22 - 32 mmol/L _1 Calcium 8.9 - 10.3 mg/dL 10.1 10.2 10.2  Total Protein 6.5 - 8.1 g/dL 7.6 7.8 7.6  Total Bilirubin 0.3 - 1.2 mg/dL 0.8 0.6 0.6  Alkaline Phos 38 - 126 U/L 78 81 82  AST 15 - 41 U/L _2 ALT 0 - 44 U/L _3 DIAGNOSTIC IMAGING:  I have independently reviewed the scans and discussed with the patient. No results found.   ASSESSMENT:  1. 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 -Herceptin  and Pertuzumab maintenance started on 01/02/2020. -Bilateral mastectomies on 01/24/2020.  Pathology showed YPT0Y PN 0, 0/6 lymph nodes involved. -She met with Dr. Sondra Come who did not recommend adjuvant radiation. -TAH/BSO on 04/14/2020. -Anastrozole to start on 04/24/2020.  2.  BRCA1 positive: -Bilateral mastectomies on 01/24/2020. -TAH/BSO on 04/14/2020.   PLAN:  1. Clinical T2N0 right breast IDC, ER positive, HER-2 positive  by FISH: -She is tolerating maintenance Herceptin and Pertuzumab very well. -She is also taking anastrozole every day. -Reviewed labs from 07/17/2020.  LFTs are normal.  ALT lites are normal.  CBC was normal. -She will proceed with Herceptin and Pertuzumab today and in 3 weeks.  I will see her back in 6 weeks.  2. BRCA1 positive: -Completed bilateral mastectomies and TAH/BSO.  3.  Hypokalemia: -Continue potassium 40 mEq daily.  Potassium is 3.7.  4.  Hypomagnesemia: -Continue magnesium twice daily.  Magnesium is 1.9.  5. High risk drug monitoring: -No signs of PND or orthopnea.  We will order echocardiogram.  6.  Hot flashes: -She reported extensive hot flashes during the daytime every 15 to 20 minutes.  At nighttime they are 1-2 per night. -I have recommended her to try black cohosh.  If there is no improvement will consider venlafaxine.  7.  Cramping: -She experiences Cramping in the upper and lower extremities on certain movements.  Electrolytes are normal.  Question neuropathy from prior taxane.  Will monitor at this time.   Orders placed this encounter:  No orders of the defined types were placed in this encounter.    Derek Jack, MD [place] [phone]   I, Milinda Antis, am acting as a scribe for Dr. Sanda Linger.  I, Derek Jack MD, have reviewed the above documentation for accuracy and completeness, and I agree with the above.

## 2020-07-20 NOTE — Progress Notes (Signed)
Patient was assessed by Dr. Delton Coombes and labs have been reviewed.  Patient is okay to proceed with treatment today. Patient will have echocardiogram before next treatment. Primary RN and pharmacy aware.

## 2020-07-20 NOTE — Patient Instructions (Signed)
[place] at [facility] Discharge Instructions  You were seen today by Dr. Delton Coombes. He went over your recent results. You received your treatment today; your next treatment will be in 3 weeks. Purchase black cohosh over the counter to control the hot flashes. Purchase retinol-A or benzoyl peroxide for the acne control. Dr. Delton Coombes will see you back in 6 weeks for labs and follow up.   Thank you for choosing [place] at [facility] to provide your oncology and hematology care.  To afford each patient quality time with our provider, please arrive at least 15 minutes before your scheduled appointment time.   If you have a lab appointment with the [facility] please come in thru the Main Entrance and check in at the main information desk  You need to re-schedule your appointment should you arrive 10 or more minutes late.  We strive to give you quality time with our providers, and arriving late affects you and other patients whose appointments are after yours.  Also, if you no show three or more times for appointments you may be dismissed from the clinic at the providers discretion.     Again, thank you for choosing [facility].  Our hope is that these requests will decrease the amount of time that you wait before being seen by our physicians.       _____________________________________________________________  Should you have questions after your visit to [facility], please contact our office at [phone] between the hours of 8:00 a.m. and 4:30 p.m.  Voicemails left after 4:00 p.m. will not be returned until the following business day.  For prescription refill requests, have your pharmacy contact our office and allow 72 hours.    Cancer Center Support Programs:   > Cancer Support Group  2nd Tuesday of the month 1pm-2pm, Journey Room

## 2020-07-20 NOTE — Patient Instructions (Signed)
[facility] Discharge Instructions for Patients Receiving Chemotherapy   Beginning January 23rd 2017 lab work for the Cancer Center will be done in the  Main lab at [facility] on 1st floor. If you have a lab appointment with the Cancer Center please come in thru the  Main Entrance and check in at the main information desk   Today you received the following chemotherapy agents Herceptin and Perjeta  To help prevent nausea and vomiting after your treatment, we encourage you to take your nausea medication    If you develop nausea and vomiting, or diarrhea that is not controlled by your medication, call the clinic.  The clinic phone number is [phone]. Office hours are Monday-Friday 8:30am-5:00pm.  BELOW ARE SYMPTOMS THAT SHOULD BE REPORTED IMMEDIATELY:  *FEVER GREATER THAN 101.0 F  *CHILLS WITH OR WITHOUT FEVER  NAUSEA AND VOMITING THAT IS NOT CONTROLLED WITH YOUR NAUSEA MEDICATION  *UNUSUAL SHORTNESS OF BREATH  *UNUSUAL BRUISING OR BLEEDING  TENDERNESS IN MOUTH AND THROAT WITH OR WITHOUT PRESENCE OF ULCERS  *URINARY PROBLEMS  *BOWEL PROBLEMS  UNUSUAL RASH Items with * indicate a potential emergency and should be followed up as soon as possible. If you have an emergency after office hours please contact your primary care physician or go to the nearest emergency department.  Please call the clinic during office hours if you have any questions or concerns.   You may also contact the Patient Navigator at [phone] should you have any questions or need assistance in obtaining follow up care.      Resources For Cancer Patients and their Caregivers ? American Cancer Society: Can assist with transportation, wigs, general needs, runs Look Good Feel Better.        1-888-227-6333 ? Cancer Care: Provides financial assistance, online support groups, medication/co-pay assistance.  1-800-813-HOPE (4673) ? [facility] Assists  [place] cancer patients and their families through emotional , educational and financial support.  336-427-4357 ? [place] DSS Where to apply for food stamps, Medicaid and utility assistance. 336-342-1394 ? [facility]: Transportation to medical appointments. 336-347-2287 ? Social Security Administration: May apply for disability if have a Stage IV cancer. 336-342-7796 1-800-772-1213 ? [place] Aging, Disability and Transit Services: Assists with nutrition, care and transit needs. 336-349-2343          

## 2020-07-20 NOTE — Progress Notes (Signed)
Monica Hancock presents today for D1C10 traztuzumab and pertuzumab. Pt denies any new changes or symptoms since last treatment. Lab results and vitals have been reviewed and are stable and within parameters for treatment. Last ECHO 04-08-20 with EF 60-65%. Per MD, pt will have ECHO done prior to next treatment. Patient has been assessed by Dr. Delton Coombes who has approved proceeding with treatment today as planned.  Infusions tolerated without incident or complaint. VSS upon completion of treatment. Port flushed and deaccessed per protocol, see MAR and IV flowsheet for details. Discharged in satisfactory condition with follow up instructions.

## 2020-08-03 ENCOUNTER — Other Ambulatory Visit (HOSPITAL_COMMUNITY): Payer: Medicaid Other

## 2020-08-03 ENCOUNTER — Ambulatory Visit (HOSPITAL_COMMUNITY)
Admission: RE | Admit: 2020-08-03 | Discharge: 2020-08-03 | Disposition: A | Discharge status: Home / Self Care | Payer: Medicaid Other | Source: Ambulatory Visit | Attending: Hematology | Admitting: Hematology

## 2020-08-03 ENCOUNTER — Other Ambulatory Visit: Payer: Self-pay

## 2020-08-03 DIAGNOSIS — Z170 Estrogen receptor positive status [ER+]: Secondary | ICD-10-CM | POA: Diagnosis present

## 2020-08-03 DIAGNOSIS — C50411 Malignant neoplasm of upper-outer quadrant of right female breast: Secondary | ICD-10-CM | POA: Diagnosis not present

## 2020-08-03 LAB — ECHOCARDIOGRAM COMPLETE
AR max vel: 2.1 cm2
AV Area VTI: 1.98 cm2
AV Area mean vel: 2.27 cm2
AV Mean grad: 3.8 mmHg
AV Peak grad: 7.8 mmHg
Ao pk vel: 1.4 m/s
Area-P 1/2: 2.54 cm2
S' Lateral: 2.17 cm

## 2020-08-03 NOTE — Progress Notes (Signed)
*  PRELIMINARY RESULTS* Echocardiogram 2D Echocardiogram has been performed.  Monica Hancock 08/03/2020, 4:05 PM

## 2020-08-10 ENCOUNTER — Other Ambulatory Visit: Payer: Self-pay

## 2020-08-10 ENCOUNTER — Other Ambulatory Visit (HOSPITAL_COMMUNITY): Payer: Medicaid Other

## 2020-08-10 ENCOUNTER — Inpatient Hospital Stay (HOSPITAL_COMMUNITY): Payer: Medicaid Other | Attending: Hematology

## 2020-08-10 ENCOUNTER — Inpatient Hospital Stay (HOSPITAL_COMMUNITY): Payer: Medicaid Other

## 2020-08-10 ENCOUNTER — Ambulatory Visit (HOSPITAL_COMMUNITY): Payer: Medicaid Other | Admitting: Hematology

## 2020-08-10 VITALS — BP 116/72 | HR 68 | Temp 97.2°F | Resp 18 | Wt 156.2 lb

## 2020-08-10 DIAGNOSIS — E876 Hypokalemia: Secondary | ICD-10-CM | POA: Diagnosis not present

## 2020-08-10 DIAGNOSIS — C50412 Malignant neoplasm of upper-outer quadrant of left female breast: Secondary | ICD-10-CM | POA: Insufficient documentation

## 2020-08-10 DIAGNOSIS — Z90710 Acquired absence of both cervix and uterus: Secondary | ICD-10-CM | POA: Insufficient documentation

## 2020-08-10 DIAGNOSIS — Z9013 Acquired absence of bilateral breasts and nipples: Secondary | ICD-10-CM | POA: Diagnosis not present

## 2020-08-10 DIAGNOSIS — Z79811 Long term (current) use of aromatase inhibitors: Secondary | ICD-10-CM | POA: Diagnosis not present

## 2020-08-10 DIAGNOSIS — Z803 Family history of malignant neoplasm of breast: Secondary | ICD-10-CM | POA: Diagnosis not present

## 2020-08-10 DIAGNOSIS — Z170 Estrogen receptor positive status [ER+]: Secondary | ICD-10-CM | POA: Diagnosis not present

## 2020-08-10 DIAGNOSIS — Z79899 Other long term (current) drug therapy: Secondary | ICD-10-CM | POA: Diagnosis not present

## 2020-08-10 DIAGNOSIS — Z800 Family history of malignant neoplasm of digestive organs: Secondary | ICD-10-CM | POA: Insufficient documentation

## 2020-08-10 DIAGNOSIS — Z5112 Encounter for antineoplastic immunotherapy: Secondary | ICD-10-CM | POA: Diagnosis not present

## 2020-08-10 DIAGNOSIS — C50411 Malignant neoplasm of upper-outer quadrant of right female breast: Secondary | ICD-10-CM

## 2020-08-10 DIAGNOSIS — Z90722 Acquired absence of ovaries, bilateral: Secondary | ICD-10-CM | POA: Diagnosis not present

## 2020-08-10 DIAGNOSIS — Z9079 Acquired absence of other genital organ(s): Secondary | ICD-10-CM | POA: Diagnosis not present

## 2020-08-10 DIAGNOSIS — N951 Menopausal and female climacteric states: Secondary | ICD-10-CM | POA: Insufficient documentation

## 2020-08-10 DIAGNOSIS — Z833 Family history of diabetes mellitus: Secondary | ICD-10-CM | POA: Insufficient documentation

## 2020-08-10 MED ORDER — DIPHENHYDRAMINE HCL 25 MG PO CAPS
25.0000 mg | ORAL_CAPSULE | Freq: Once | ORAL | Status: AC
  Administered 2020-08-10: 25 mg via ORAL
  Filled 2020-08-10: qty 1

## 2020-08-10 MED ORDER — ACETAMINOPHEN 325 MG PO TABS
650.0000 mg | ORAL_TABLET | Freq: Once | ORAL | Status: AC
  Administered 2020-08-10: 650 mg via ORAL
  Filled 2020-08-10: qty 2

## 2020-08-10 MED ORDER — TRASTUZUMAB-DKST CHEMO 150 MG IV SOLR
6.0000 mg/kg | Freq: Once | INTRAVENOUS | Status: AC
  Administered 2020-08-10: 420 mg via INTRAVENOUS
  Filled 2020-08-10: qty 20

## 2020-08-10 MED ORDER — HEPARIN SOD (PORK) LOCK FLUSH 100 UNIT/ML IV SOLN
500.0000 [IU] | Freq: Once | INTRAVENOUS | Status: AC | PRN
  Administered 2020-08-10: 500 [IU]

## 2020-08-10 MED ORDER — SODIUM CHLORIDE 0.9 % IV SOLN
420.0000 mg | Freq: Once | INTRAVENOUS | Status: AC
  Administered 2020-08-10: 420 mg via INTRAVENOUS
  Filled 2020-08-10: qty 14

## 2020-08-10 MED ORDER — SODIUM CHLORIDE 0.9 % IV SOLN: Freq: Once | INTRAVENOUS | Status: AC

## 2020-08-10 MED ORDER — SODIUM CHLORIDE 0.9% FLUSH
10.0000 mL | INTRAVENOUS | Status: DC | PRN
  Administered 2020-08-10: 10 mL

## 2020-08-10 NOTE — Patient Instructions (Signed)
[facility] Discharge Instructions for Patients Receiving Chemotherapy  Today you received the following chemotherapy agents   To help prevent nausea and vomiting after your treatment, we encourage you to take your nausea medication   If you develop nausea and vomiting that is not controlled by your nausea medication, call the clinic.   BELOW ARE SYMPTOMS THAT SHOULD BE REPORTED IMMEDIATELY:  *FEVER GREATER THAN 100.5 F  *CHILLS WITH OR WITHOUT FEVER  NAUSEA AND VOMITING THAT IS NOT CONTROLLED WITH YOUR NAUSEA MEDICATION  *UNUSUAL SHORTNESS OF BREATH  *UNUSUAL BRUISING OR BLEEDING  TENDERNESS IN MOUTH AND THROAT WITH OR WITHOUT PRESENCE OF ULCERS  *URINARY PROBLEMS  *BOWEL PROBLEMS  UNUSUAL RASH Items with * indicate a potential emergency and should be followed up as soon as possible.  Feel free to call the clinic should you have any questions or concerns. The clinic phone number is [phone].  Please show the CHEMO ALERT CARD at check-in to the Emergency Department and triage nurse.   

## 2020-08-10 NOTE — Progress Notes (Signed)
Treatment given per orders. Patient tolerated it well without problems. Vitals stable and discharged home from clinic ambulatory in stable condition. Follow up as scheduled.  

## 2020-08-10 NOTE — Progress Notes (Signed)
No labs needed to be drawn today. Labs ordered every 6 weeks.

## 2020-08-11 ENCOUNTER — Ambulatory Visit (HOSPITAL_COMMUNITY): Payer: Medicaid Other

## 2020-08-11 ENCOUNTER — Other Ambulatory Visit (HOSPITAL_COMMUNITY): Payer: Medicaid Other

## 2020-08-11 ENCOUNTER — Ambulatory Visit (HOSPITAL_COMMUNITY): Payer: Medicaid Other | Admitting: Hematology

## 2020-08-31 ENCOUNTER — Ambulatory Visit (HOSPITAL_COMMUNITY): Payer: Medicaid Other

## 2020-08-31 ENCOUNTER — Other Ambulatory Visit (HOSPITAL_COMMUNITY): Payer: Medicaid Other

## 2020-08-31 ENCOUNTER — Ambulatory Visit (HOSPITAL_COMMUNITY): Payer: Medicaid Other | Admitting: Hematology

## 2020-09-04 ENCOUNTER — Telehealth: Payer: Self-pay | Admitting: Unknown Physician Specialty

## 2020-09-04 ENCOUNTER — Other Ambulatory Visit: Payer: Self-pay | Admitting: Unknown Physician Specialty

## 2020-09-04 ENCOUNTER — Ambulatory Visit (HOSPITAL_COMMUNITY)
Admission: RE | Admit: 2020-09-04 | Discharge: 2020-09-04 | Disposition: A | Discharge status: Home / Self Care | Payer: Medicaid Other | Source: Ambulatory Visit | Attending: Pulmonary Disease | Admitting: Pulmonary Disease

## 2020-09-04 DIAGNOSIS — C50411 Malignant neoplasm of upper-outer quadrant of right female breast: Secondary | ICD-10-CM | POA: Insufficient documentation

## 2020-09-04 DIAGNOSIS — Z170 Estrogen receptor positive status [ER+]: Secondary | ICD-10-CM

## 2020-09-04 DIAGNOSIS — U071 COVID-19: Secondary | ICD-10-CM | POA: Insufficient documentation

## 2020-09-04 MED ORDER — METHYLPREDNISOLONE SODIUM SUCC 125 MG IJ SOLR: 125.0000 mg | Freq: Once | INTRAMUSCULAR | Status: DC | PRN

## 2020-09-04 MED ORDER — FAMOTIDINE IN NACL 20-0.9 MG/50ML-% IV SOLN: 20.0000 mg | Freq: Once | INTRAVENOUS | Status: DC | PRN

## 2020-09-04 MED ORDER — DIPHENHYDRAMINE HCL 50 MG/ML IJ SOLN: 50.0000 mg | Freq: Once | INTRAMUSCULAR | Status: DC | PRN

## 2020-09-04 MED ORDER — SODIUM CHLORIDE 0.9 % IV SOLN: Freq: Once | INTRAVENOUS | Status: AC

## 2020-09-04 MED ORDER — ACETAMINOPHEN 325 MG PO TABS
650.0000 mg | ORAL_TABLET | Freq: Four times a day (QID) | ORAL | Status: AC | PRN
  Administered 2020-09-04: 650 mg via ORAL
  Filled 2020-09-04: qty 2

## 2020-09-04 MED ORDER — EPINEPHRINE 0.3 MG/0.3ML IJ SOAJ: 0.3000 mg | Freq: Once | INTRAMUSCULAR | Status: DC | PRN

## 2020-09-04 MED ORDER — ALBUTEROL SULFATE HFA 108 (90 BASE) MCG/ACT IN AERS: 2.0000 | INHALATION_SPRAY | Freq: Once | RESPIRATORY_TRACT | Status: DC | PRN

## 2020-09-04 MED ORDER — SODIUM CHLORIDE 0.9 % IV SOLN: INTRAVENOUS | Status: DC | PRN

## 2020-09-04 NOTE — Telephone Encounter (Signed)
I connected by phone with Monica Hancock on 09/04/2020 at 8:42 AM to discuss the potential use of a new treatment for mild to moderate COVID-19 viral infection in non-hospitalized patients.  This patient is a 39 y.o. female that meets the FDA criteria for Emergency Use Authorization of COVID monoclonal antibody casirivimab/imdevimab or bamlanivimab/eteseviamb.  Has a (+) direct SARS-CoV-2 viral test result  Has mild or moderate COVID-19   Is NOT hospitalized due to COVID-19  Is within 10 days of symptom onset  Has at least one of the high risk factor(s) for progression to severe COVID-19 and/or hospitalization as defined in EUA.  Specific high risk criteria : BMI > 25   I have spoken and communicated the following to the patient or parent/caregiver regarding COVID monoclonal antibody treatment:  1. FDA has authorized the emergency use for the treatment of mild to moderate COVID-19 in adults and pediatric patients with positive results of direct SARS-CoV-2 viral testing who are 10 years of age and older weighing at least 40 kg, and who are at high risk for progressing to severe COVID-19 and/or hospitalization.  2. The significant known and potential risks and benefits of COVID monoclonal antibody, and the extent to which such potential risks and benefits are unknown.  3. Information on available alternative treatments and the risks and benefits of those alternatives, including clinical trials.  4. Patients treated with COVID monoclonal antibody should continue to self-isolate and use infection control measures (e.g., wear mask, isolate, social distance, avoid sharing personal items, clean and disinfect "high touch" surfaces, and frequent handwashing) according to CDC guidelines.   5. The patient or parent/caregiver has the option to accept or refuse COVID monoclonal antibody treatment.  After reviewing this information with the patient, the patient has agreed to receive one of the  available covid 19 monoclonal antibodies and will be provided an appropriate fact sheet prior to infusion. Kathrine Haddock, NP 09/04/2020 8:42 AM  Sx onset 10/11

## 2020-09-04 NOTE — Progress Notes (Signed)
  Diagnosis: COVID-19  Physician: Dr. Joya Gaskins  Procedure: [facility]fusion [facility] Med: bamlanivimab\etesevimab infusion - Provided patient with bamlanimivab\etesevimab fact sheet for patients, parents and caregivers prior to infusion.  Complications: No immediate complications noted.  Discharge: Discharged home   Winfred Leeds 09/04/2020

## 2020-09-04 NOTE — Discharge Instructions (Signed)

## 2020-09-07 ENCOUNTER — Other Ambulatory Visit (HOSPITAL_COMMUNITY): Payer: Self-pay | Admitting: Hematology

## 2020-09-07 DIAGNOSIS — C50411 Malignant neoplasm of upper-outer quadrant of right female breast: Secondary | ICD-10-CM

## 2020-09-17 ENCOUNTER — Other Ambulatory Visit (HOSPITAL_COMMUNITY): Payer: Self-pay

## 2020-09-20 IMAGING — DX DG BONE SURVEY MET
9 of 10 series · 9 of 10 positions shown · non-contrast
Comparison: 08/15/2019

CLINICAL DATA: Infiltrating ductal carcinoma upper outer right
breast, left upper extremity pain

EXAM:
METASTATIC BONE SURVEY

[skull lat]
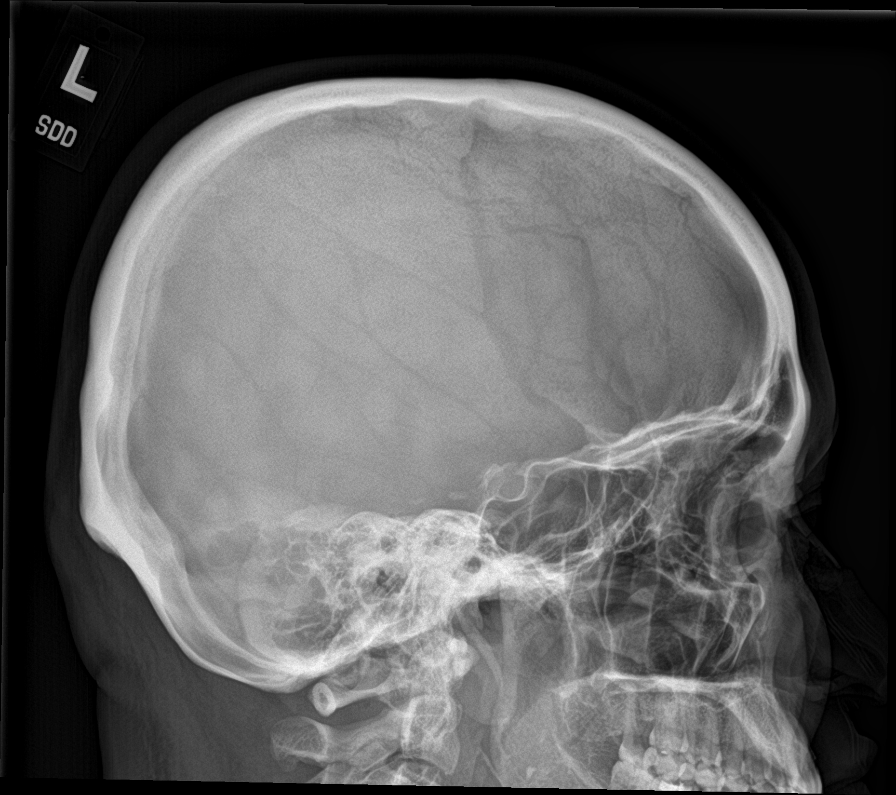

[shoulder ap (1 of 2)]
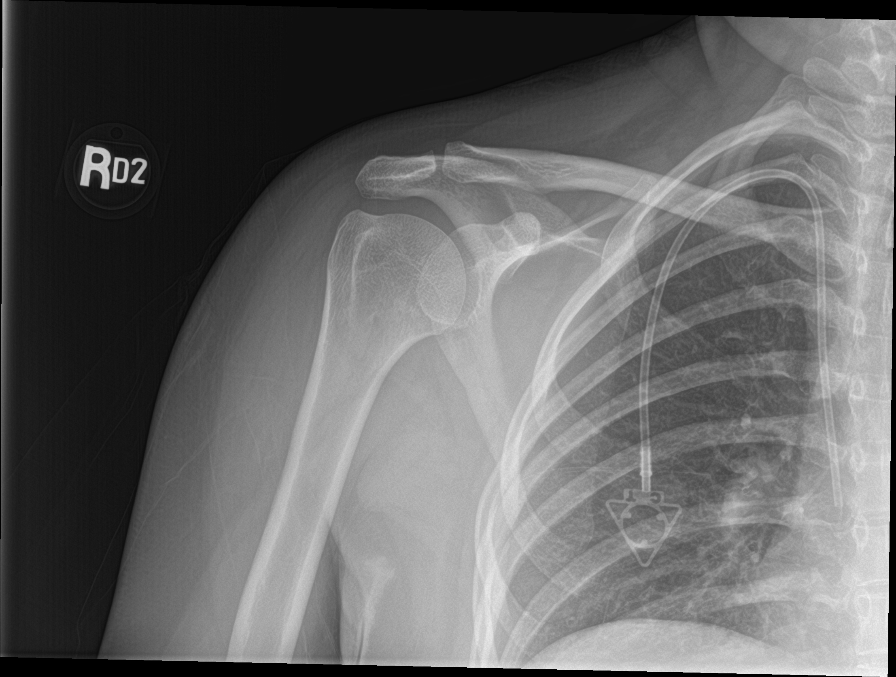

[shoulder ap (2 of 2)]
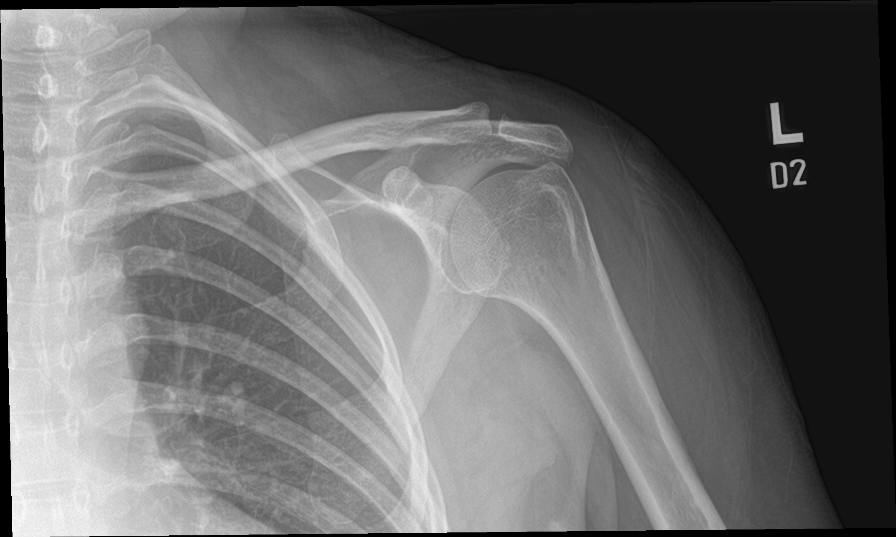

[humerus ap (1 of 2)]
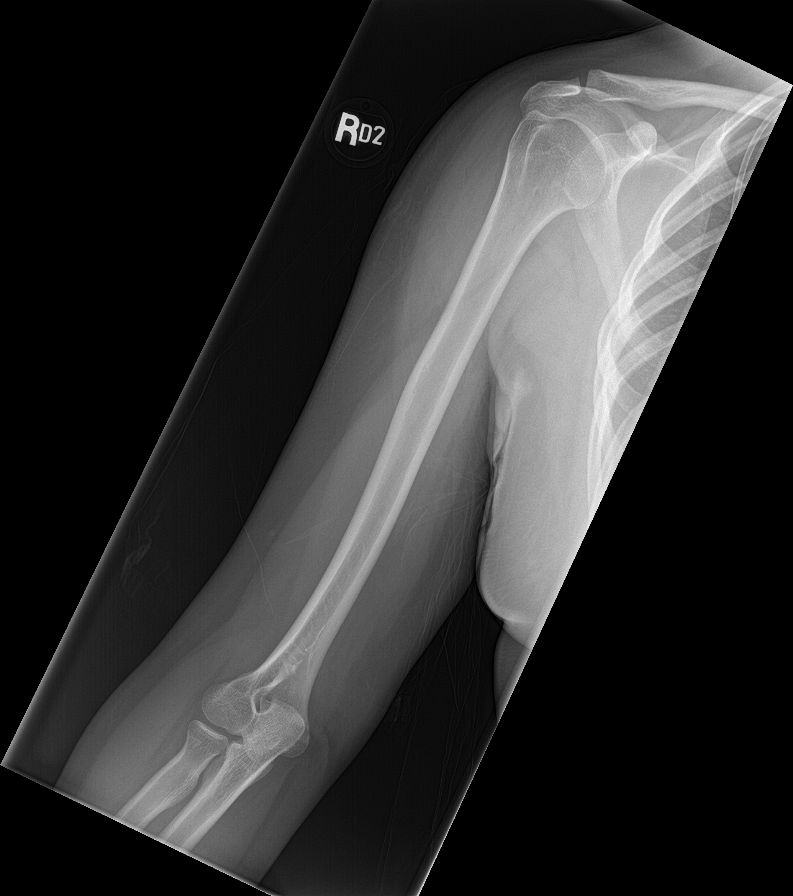

[humerus ap (2 of 2)]
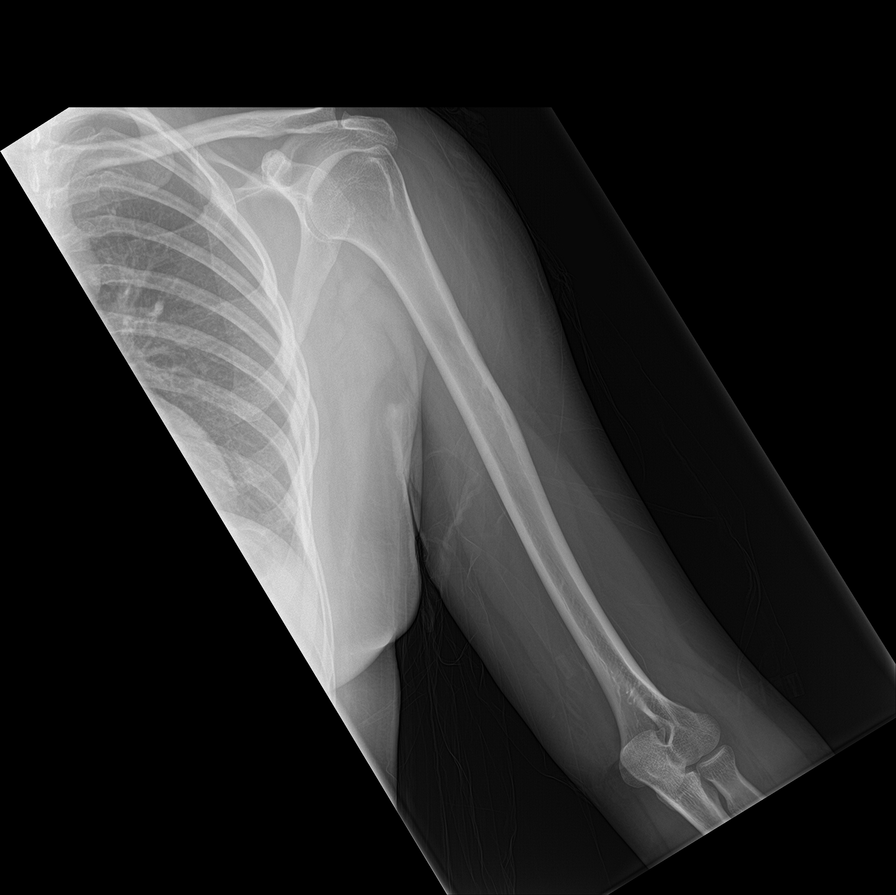

[forearm ap (1 of 2)]
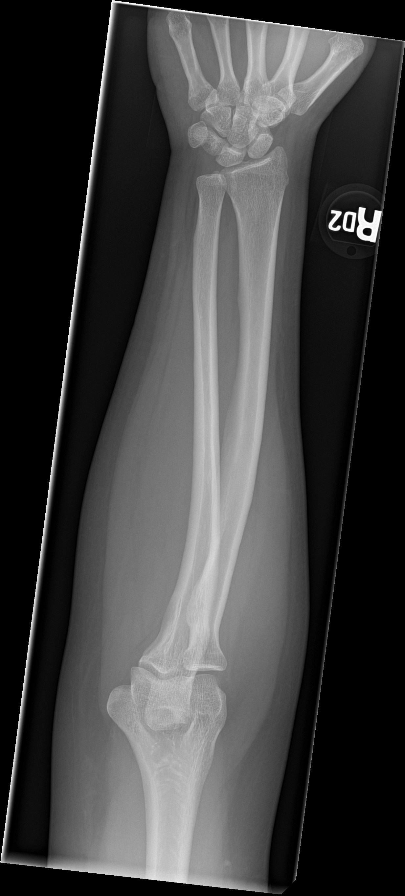

[forearm ap (2 of 2)]
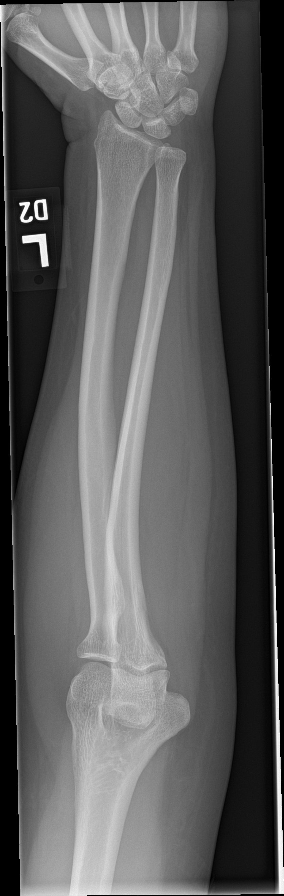

[c-spine ap]
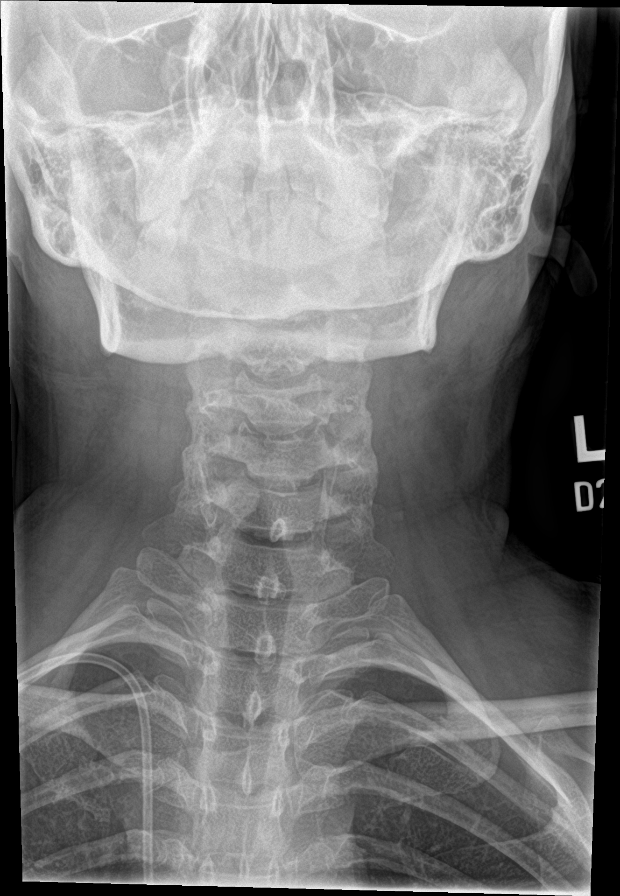

[c-spine lat]
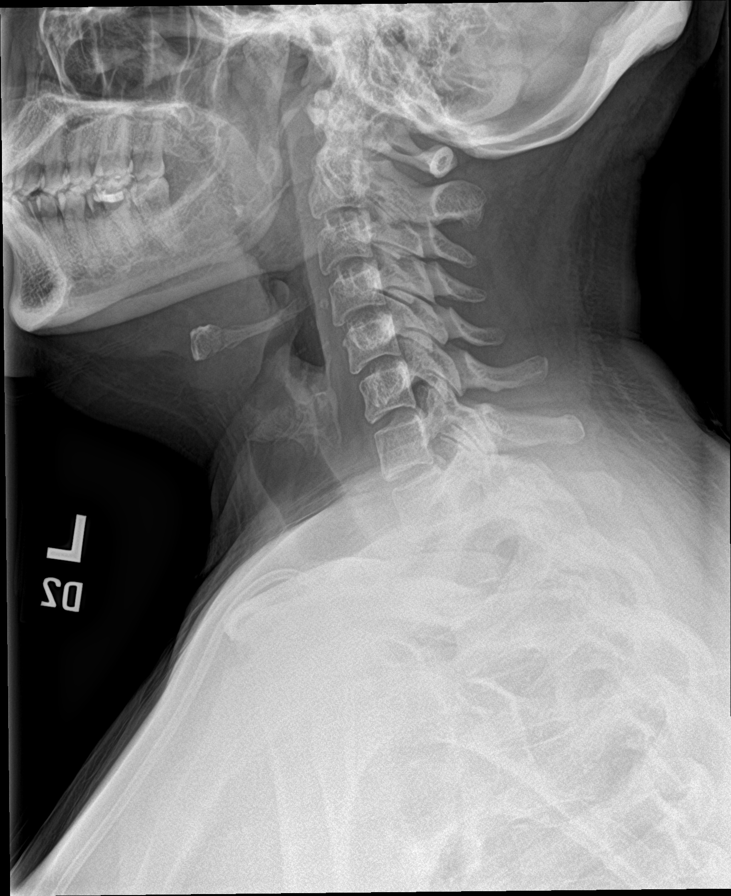

[9 of 10 positions shown; findings below may reference images not displayed]

FINDINGS: Standard skeletal survey was performed:

Lateral skull: No acute or destructive bony lesion.

Bilateral humeri: No acute or destructive bony lesions. Shoulders
and elbows are unremarkable.

Bilateral forearms: No acute or destructive bony lesions. Bilateral
wrists and elbows are unremarkable.

Two views spine: Frontal and lateral views of the cervical,
thoracic, lumbar spine demonstrate no acute or destructive bony
lesions. Mild rotatory thoracic scoliosis.

Chest: Frontal view of the chest demonstrates right chest wall port
tip overlying superior vena cava. Cardiac silhouette is
unremarkable. No airspace disease, effusion, or pneumothorax. No
acute or destructive bony lesions.

Pelvis: Single frontal view of the pelvis demonstrates no fractures.
Alignment is anatomic. No acute or destructive bony lesions.

Bilateral femurs: No acute or destructive bony lesions.

Bilateral lower legs: No acute or destructive bony lesions.
IMPRESSION: 1. Unremarkable skeletal survey, with no evidence of bony metastatic
disease.

## 2020-09-28 ENCOUNTER — Other Ambulatory Visit (HOSPITAL_COMMUNITY): Payer: Self-pay

## 2020-09-28 ENCOUNTER — Telehealth (HOSPITAL_COMMUNITY): Payer: Self-pay

## 2020-09-28 MED ORDER — MAGNESIUM OXIDE 400 (241.3 MG) MG PO TABS: ORAL_TABLET | ORAL | 2 refills | Status: DC

## 2020-09-28 NOTE — Telephone Encounter (Signed)
Per note patient continues Magnesium Oxide.  Refill request processed.

## 2020-09-29 ENCOUNTER — Other Ambulatory Visit: Payer: Self-pay

## 2020-09-29 ENCOUNTER — Ambulatory Visit
Admission: EM | Admit: 2020-09-29 | Discharge: 2020-09-29 | Disposition: A | Discharge status: Home / Self Care | Payer: Medicaid Other | Attending: Emergency Medicine | Admitting: Emergency Medicine

## 2020-09-29 DIAGNOSIS — R300 Dysuria: Secondary | ICD-10-CM | POA: Diagnosis not present

## 2020-09-29 LAB — POCT URINALYSIS DIP (MANUAL ENTRY)
Glucose, UA: 100 mg/dL — AB
Ketones, POC UA: NEGATIVE mg/dL
Nitrite, UA: POSITIVE — AB
Protein Ur, POC: >=300 mg/dL — AB
Spec Grav, UA: >=1.03 — AB (ref 1.010–1.025)
Urobilinogen, UA: 0.2 E.U./dL
pH, UA: 6.5 (ref 5.0–8.0)

## 2020-09-29 MED ORDER — CEPHALEXIN 500 MG PO CAPS: 500.0000 mg | ORAL_CAPSULE | Freq: Two times a day (BID) | ORAL | 0 refills | Status: AC

## 2020-09-29 NOTE — Discharge Instructions (Signed)
Urine concerning for infection Push fluids and get plenty of rest.   Take antibiotic as directed and to completion Follow up with PCP if symptoms persists Return here or go to ER if you have any new or worsening symptoms such as fever, worsening abdominal pain, nausea/vomiting, flank pain, etc..Marland Kitchen

## 2020-09-29 NOTE — ED Triage Notes (Signed)
Pt presents with c/o dysuria that began yesterday  

## 2020-09-29 NOTE — ED Provider Notes (Signed)
[facility]   CC: Burning with urination  SUBJECTIVE:  Monica Hancock is a 39 y.o. female who complains of dysuria x 1 day.  Patient denies a precipitating event, recent sexual encounter, excessive caffeine intake.  Complains of abdominal pressure.  Denies alleviating factors.  Symptoms are made worse with urination.  Admits to similar symptoms in the past UTI.  Denies fever, chills, nausea, vomiting, abdominal pain, flank pain, abnormal vaginal discharge or bleeding, hematuria.    LMP: Patient's last menstrual period was 09/29/2019.  ROS: As in HPI.  All other pertinent ROS negative.     Past Medical History:  Diagnosis Date  . BRCA1 positive   . Cancer [facility])    Right Breast  . Family history of adverse reaction to anesthesia    PONV-children  . Family history of BRCA1 gene positive   . Family history of breast cancer   . H/O Bell's palsy 2000   Left sided  . HSV infection    Past Surgical History:  Procedure Laterality Date  . BREAST BIOPSY Right   . IR IMAGING GUIDED PORT INSERTION  08/26/2019   Right  . MASTECTOMY MODIFIED RADICAL Right 01/24/2020   Procedure: RIGHT MASTECTOMY MODIFIED RADICAL;  Surgeon: Aviva Signs, MD;  Location: [facility];  Service: General;  Laterality: Right;  . SALPINGOOPHORECTOMY Bilateral 04/14/2020   Procedure: OPEN BILATERAL SALPINGO OOPHORECTOMY;  Surgeon: [name], MD;  Location: [facility];  Service: Gynecology;  Laterality: Bilateral;  . SIMPLE MASTECTOMY WITH AXILLARY SENTINEL NODE BIOPSY Left 01/24/2020   Procedure: LEFT SIMPLE MASTECTOMY;  Surgeon: Aviva Signs, MD;  Location: [facility];  Service: General;  Laterality: Left;  . SUPRACERVICAL ABDOMINAL HYSTERECTOMY N/A 04/14/2020   Procedure: HYSTERECTOMY SUPRACERVICAL ABDOMINAL;  Surgeon: [name], MD;  Location: [facility];  Service: Gynecology;  Laterality: N/A;  . TUBAL LIGATION     No Known Allergies No current facility-administered medications on file prior to  encounter.   Current Outpatient Medications on File Prior to Encounter  Medication Sig Dispense Refill  . acetaminophen (TYLENOL) 325 MG tablet Take 650 mg by mouth every 6 (six) hours as needed.     Marland Kitchen anastrozole (ARIMIDEX) 1 MG tablet Take 1 mg by mouth daily.     . diphenhydrAMINE (BENADRYL) 50 MG tablet Take 50 mg by mouth every 8 (eight) hours as needed for allergies.     . ferrous sulfate (FERROUSUL) 325 (65 FE) MG tablet Take 1 tablet (325 mg total) by mouth daily with breakfast. 90 tablet 1  . ibuprofen (ADVIL) 600 MG tablet Take 1 tablet (600 mg total) by mouth every 6 (six) hours as needed for fever or headache. 30 tablet 0  . lidocaine-prilocaine (EMLA) cream Apply 1 application topically See admin instructions. Apply small amount to port a cath and cover with plastic wrap 1 hour prior to chemotherapy    . loratadine (CLARITIN) 10 MG tablet Take 10 mg by mouth daily as needed for allergies.     . magnesium oxide (MAGNESIUM-OXIDE) 400 (241.3 Mg) MG tablet TAKE 1 TABLET(400 MG) BY MOUTH TWICE DAILY 60 tablet 2  . oxyCODONE (OXY IR/ROXICODONE) 5 MG immediate release tablet Take 1-2 tablets (5-10 mg total) by mouth every 4 (four) hours as needed for moderate pain. 30 tablet 0  . pertuzumab in sodium chloride 0.9 % 250 mL Inject into the vein every 21 ( twenty-one) days.    . potassium chloride (KLOR-CON) 10 MEQ tablet TAKE 2 TABLETS(20 MEQ) BY MOUTH  TWICE DAILY 120 tablet 3  . prochlorperazine (COMPAZINE) 10 MG tablet Take 1 tablet (10 mg total) by mouth every 6 (six) hours as needed for nausea or vomiting. 30 tablet 3  . trastuzumab-dkst 2 mg/kg in sodium chloride 0.9 % 250 mL Inject into the vein every 21 ( twenty-one) days.     Social History   Socioeconomic History  . Marital status: Married    Spouse name: [name]  . Number of children: 5  . Years of education: Not on file  . Highest education level: 12th grade  Occupational History  . Not on file  Tobacco Use  . Smoking status:  Never Smoker  . Smokeless tobacco: Never Used  Vaping Use  . Vaping Use: Never used  Substance and Sexual Activity  . Alcohol use: No  . Drug use: No  . Sexual activity: Not Currently    Birth control/protection: Surgical    Comment: [facility]  Other Topics Concern  . Not on file  Social History Narrative  . Not on file   Social Determinants of Health   Financial Resource Strain:   . Difficulty of Paying Living Expenses: Not on file  Food Insecurity:   . Worried About Charity fundraiser in the Last Year: Not on file  . Ran Out of Food in the Last Year: Not on file  Transportation Needs:   . Lack of Transportation (Medical): Not on file  . Lack of Transportation (Non-Medical): Not on file  Physical Activity:   . Days of Exercise per Week: Not on file  . Minutes of Exercise per Session: Not on file  Stress:   . Feeling of Stress : Not on file  Social Connections:   . Frequency of Communication with Friends and Family: Not on file  . Frequency of Social Gatherings with Friends and Family: Not on file  . Attends Religious Services: Not on file  . Active Member of Clubs or Organizations: Not on file  . Attends Archivist Meetings: Not on file  . Marital Status: Not on file  Intimate Partner Violence:   . Fear of Current or Ex-Partner: Not on file  . Emotionally Abused: Not on file  . Physically Abused: Not on file  . Sexually Abused: Not on file   Family History  Problem Relation Age of Onset  . Diabetes Mother   . Breast cancer Mother 50  . Cancer Mother   . Diabetes Maternal Grandmother   . Stomach cancer Maternal Grandfather   . Breast cancer Maternal Aunt        dx in her 53s  . Diabetes Maternal Aunt     OBJECTIVE:  Vitals:   09/29/20 0935  BP: 111/76  Pulse: (!) 117  Resp: 20  Temp: 98.8 F (37.1 C)  SpO2: 98%   General appearance: AOx3 in no acute distress HEENT: NCAT.  Oropharynx clear.  Lungs: clear to auscultation bilaterally without  adventitious breath sounds Heart: Tachycardic Abdomen: soft; non-distended; no tenderness; bowel sounds present; no guarding Back: no CVA tenderness Extremities: no edema; symmetrical with no gross deformities Skin: warm and dry Neurologic: Ambulates from chair to exam table without difficulty Psychological: alert and cooperative; normal mood and affect  Labs Reviewed  POCT URINALYSIS DIP (MANUAL ENTRY) - Abnormal; Notable for the following components:      Result Value   Clarity, UA cloudy (*)    Glucose, UA =100 (*)    Bilirubin, UA small (*)    Spec  Grav, UA >=1.030 (*)    Blood, UA large (*)    Protein Ur, POC >=300 (*)    Nitrite, UA Positive (*)    Leukocytes, UA Small (1+) (*)    All other components within normal limits  URINE CULTURE    ASSESSMENT & PLAN:  1. Dysuria     Meds ordered this encounter  Medications  . cephALEXin (KEFLEX) 500 MG capsule    Sig: Take 1 capsule (500 mg total) by mouth 2 (two) times daily for 10 days.    Dispense:  20 capsule    Refill:  0    Order Specific Question:   Supervising Provider    Answer:   Raylene Everts [[id number]]    Urine concerning for infection Push fluids and get plenty of rest.   Take antibiotic as directed and to completion Follow up with PCP if symptoms persists Return here or go to ER if you have any new or worsening symptoms such as fever, worsening abdominal pain, nausea/vomiting, flank pain, etc...  Outlined signs and symptoms indicating need for more acute intervention. Patient verbalized understanding. After Visit Summary given.     Lestine Box, PA-C 09/29/20 [phone]

## 2020-10-06 ENCOUNTER — Inpatient Hospital Stay (HOSPITAL_COMMUNITY): Payer: Medicaid Other

## 2020-10-06 ENCOUNTER — Inpatient Hospital Stay (HOSPITAL_COMMUNITY): Payer: Medicaid Other | Attending: Hematology | Admitting: Hematology

## 2020-10-06 ENCOUNTER — Other Ambulatory Visit: Payer: Self-pay

## 2020-10-06 VITALS — BP 117/75 | HR 56 | Temp 97.2°F | Resp 18

## 2020-10-06 VITALS — BP 116/73 | HR 66 | Temp 96.8°F | Resp 18 | Wt 153.0 lb

## 2020-10-06 DIAGNOSIS — Z79811 Long term (current) use of aromatase inhibitors: Secondary | ICD-10-CM | POA: Insufficient documentation

## 2020-10-06 DIAGNOSIS — C50411 Malignant neoplasm of upper-outer quadrant of right female breast: Secondary | ICD-10-CM | POA: Insufficient documentation

## 2020-10-06 DIAGNOSIS — E876 Hypokalemia: Secondary | ICD-10-CM | POA: Insufficient documentation

## 2020-10-06 DIAGNOSIS — Z23 Encounter for immunization: Secondary | ICD-10-CM | POA: Diagnosis not present

## 2020-10-06 DIAGNOSIS — Z9013 Acquired absence of bilateral breasts and nipples: Secondary | ICD-10-CM | POA: Diagnosis not present

## 2020-10-06 DIAGNOSIS — Z170 Estrogen receptor positive status [ER+]: Secondary | ICD-10-CM | POA: Insufficient documentation

## 2020-10-06 DIAGNOSIS — Z5111 Encounter for antineoplastic chemotherapy: Secondary | ICD-10-CM | POA: Insufficient documentation

## 2020-10-06 DIAGNOSIS — Z8616 Personal history of COVID-19: Secondary | ICD-10-CM | POA: Diagnosis not present

## 2020-10-06 LAB — COMPREHENSIVE METABOLIC PANEL
ALT: 30 U/L (ref 0–44)
AST: 20 U/L (ref 15–41)
Albumin: 3.9 g/dL (ref 3.5–5.0)
Alkaline Phosphatase: 81 U/L (ref 38–126)
Anion gap: 8 (ref 5–15)
BUN: 15 mg/dL (ref 6–20)
CO2: 26 mmol/L (ref 22–32)
Calcium: 10.1 mg/dL (ref 8.9–10.3)
Chloride: 104 mmol/L (ref 98–111)
Creatinine, Ser: 0.71 mg/dL (ref 0.44–1.00)
GFR, Estimated: >60 mL/min (ref >60)
Glucose, Bld: 101 mg/dL — ABNORMAL HIGH (ref 70–99)
Potassium: 3.7 mmol/L (ref 3.5–5.1)
Sodium: 138 mmol/L (ref 135–145)
Total Bilirubin: 0.9 mg/dL (ref 0.3–1.2)
Total Protein: 7.5 g/dL (ref 6.5–8.1)

## 2020-10-06 LAB — CBC WITH DIFFERENTIAL/PLATELET
Abs Immature Granulocytes: 0.03 10*3/uL (ref 0.00–0.07)
Basophils Absolute: 0 10*3/uL (ref 0.0–0.1)
Basophils Relative: 1 %
Eosinophils Absolute: 0.2 10*3/uL (ref 0.0–0.5)
Eosinophils Relative: 3 %
HCT: 39.1 % (ref 36.0–46.0)
Hemoglobin: 13.1 g/dL (ref 12.0–15.0)
Immature Granulocytes: 1 %
Lymphocytes Relative: 43 %
Lymphs Abs: 2.4 10*3/uL (ref 0.7–4.0)
MCH: 30.7 pg (ref 26.0–34.0)
MCHC: 33.5 g/dL (ref 30.0–36.0)
MCV: 91.6 fL (ref 80.0–100.0)
Monocytes Absolute: 0.5 10*3/uL (ref 0.1–1.0)
Monocytes Relative: 9 %
Neutro Abs: 2.5 10*3/uL (ref 1.7–7.7)
Neutrophils Relative %: 43 %
Platelets: 291 10*3/uL (ref 150–400)
RBC: 4.27 MIL/uL (ref 3.87–5.11)
RDW: 12.9 % (ref 11.5–15.5)
WBC: 5.6 10*3/uL (ref 4.0–10.5)
nRBC: 0 % (ref 0.0–0.2)

## 2020-10-06 LAB — MAGNESIUM: Magnesium: 1.9 mg/dL (ref 1.7–2.4)

## 2020-10-06 MED ORDER — ACETAMINOPHEN 325 MG PO TABS
ORAL_TABLET | ORAL | Status: AC
  Filled 2020-10-06: qty 2

## 2020-10-06 MED ORDER — SODIUM CHLORIDE 0.9 % IV SOLN
420.0000 mg | Freq: Once | INTRAVENOUS | Status: AC
  Administered 2020-10-06: 420 mg via INTRAVENOUS
  Filled 2020-10-06: qty 14

## 2020-10-06 MED ORDER — SODIUM CHLORIDE 0.9% FLUSH: 10.0000 mL | INTRAVENOUS | Status: DC | PRN

## 2020-10-06 MED ORDER — HEPARIN SOD (PORK) LOCK FLUSH 100 UNIT/ML IV SOLN: 500.0000 [IU] | Freq: Once | INTRAVENOUS | Status: DC | PRN

## 2020-10-06 MED ORDER — INFLUENZA VAC SPLIT QUAD 0.5 ML IM SUSY
0.5000 mL | PREFILLED_SYRINGE | Freq: Once | INTRAMUSCULAR | Status: AC
  Administered 2020-10-06: 0.5 mL via INTRAMUSCULAR
  Filled 2020-10-06: qty 0.5

## 2020-10-06 MED ORDER — SODIUM CHLORIDE 0.9 % IV SOLN: Freq: Once | INTRAVENOUS | Status: AC

## 2020-10-06 MED ORDER — DIPHENHYDRAMINE HCL 25 MG PO CAPS
ORAL_CAPSULE | ORAL | Status: AC
  Filled 2020-10-06: qty 2

## 2020-10-06 MED ORDER — ACETAMINOPHEN 325 MG PO TABS
650.0000 mg | ORAL_TABLET | Freq: Once | ORAL | Status: AC
  Administered 2020-10-06: 650 mg via ORAL

## 2020-10-06 MED ORDER — DIPHENHYDRAMINE HCL 25 MG PO CAPS
25.0000 mg | ORAL_CAPSULE | Freq: Once | ORAL | Status: AC
  Administered 2020-10-06: 25 mg via ORAL

## 2020-10-06 MED ORDER — TRASTUZUMAB-DKST CHEMO 150 MG IV SOLR
600.0000 mg | Freq: Once | INTRAVENOUS | Status: AC
  Administered 2020-10-06: 600 mg via INTRAVENOUS
  Filled 2020-10-06: qty 28.57

## 2020-10-06 NOTE — Patient Instructions (Signed)
[facility] at [facility] Discharge Instructions  Labs drawn from portacath today   Thank you for choosing [facility] at [facility] to provide your oncology and hematology care.  To afford each patient quality time with our provider, please arrive at least 15 minutes before your scheduled appointment time.   If you have a lab appointment with the Cancer Center please come in thru the Main Entrance and check in at the main information desk.  You need to re-schedule your appointment should you arrive 10 or more minutes late.  We strive to give you quality time with our providers, and arriving late affects you and other patients whose appointments are after yours.  Also, if you no show three or more times for appointments you may be dismissed from the clinic at the providers discretion.     Again, thank you for choosing [facility].  Our hope is that these requests will decrease the amount of time that you wait before being seen by our physicians.       _____________________________________________________________  Should you have questions after your visit to [facility], please contact our office at [phone] and follow the prompts.  Our office hours are 8:00 a.m. and 4:30 p.m. Monday - Friday.  Please note that voicemails left after 4:00 p.m. may not be returned until the following business day.  We are closed weekends and major holidays.  You do have access to a nurse 24-7, just call the main number to the clinic [phone] and do not press any options, hold on the line and a nurse will answer the phone.    For prescription refill requests, have your pharmacy contact our office and allow 72 hours.    Due to Covid, you will need to wear a mask upon entering the hospital. If you do not have a mask, a mask will be given to you at the Main Entrance upon arrival. For doctor visits, patients may have 1 support person age 18  or older with them. For treatment visits, patients can not have anyone with them due to social distancing guidelines and our immunocompromised population.     

## 2020-10-06 NOTE — Patient Instructions (Signed)
[facility] Discharge Instructions for Patients Receiving Chemotherapy  Today you received the following chemotherapy agents   To help prevent nausea and vomiting after your treatment, we encourage you to take your nausea medication   If you develop nausea and vomiting that is not controlled by your nausea medication, call the clinic.   BELOW ARE SYMPTOMS THAT SHOULD BE REPORTED IMMEDIATELY:  *FEVER GREATER THAN 100.5 F  *CHILLS WITH OR WITHOUT FEVER  NAUSEA AND VOMITING THAT IS NOT CONTROLLED WITH YOUR NAUSEA MEDICATION  *UNUSUAL SHORTNESS OF BREATH  *UNUSUAL BRUISING OR BLEEDING  TENDERNESS IN MOUTH AND THROAT WITH OR WITHOUT PRESENCE OF ULCERS  *URINARY PROBLEMS  *BOWEL PROBLEMS  UNUSUAL RASH Items with * indicate a potential emergency and should be followed up as soon as possible.  Feel free to call the clinic should you have any questions or concerns. The clinic phone number is [phone].  Please show the CHEMO ALERT CARD at check-in to the Emergency Department and triage nurse.   

## 2020-10-06 NOTE — Progress Notes (Signed)
Patient presents today for treatment. Vital signs with in parameters.  Labs reviewed and within parameters for treatment.  No new complaints.  Treatment given today per MD orders.  Tolerated infusion without adverse affects.  Vital signs stable.  No complaints at this time.  Discharge from clinic ambulatory in stable condition.  Alert and oriented X 3.  Follow up with [facility] as scheduled.

## 2020-10-06 NOTE — Progress Notes (Signed)
Patient was assessed by Dr. [name] and labs have been reviewed.  Patient is okay to proceed with treatment today. Primary RN and pharmacy aware.   

## 2020-10-06 NOTE — Patient Instructions (Signed)
[place] at [facility] Discharge Instructions  You were seen today by Dr. Delton Coombes. He went over your recent results. You received your final treatment today. Continue taking your Arimidex and black cohosh daily. Dr. Delton Coombes will see you back in 4 months for labs and follow up.   Thank you for choosing [place] at [facility] to provide your oncology and hematology care.  To afford each patient quality time with our provider, please arrive at least 15 minutes before your scheduled appointment time.   If you have a lab appointment with the [facility] please come in thru the Main Entrance and check in at the main information desk  You need to re-schedule your appointment should you arrive 10 or more minutes late.  We strive to give you quality time with our providers, and arriving late affects you and other patients whose appointments are after yours.  Also, if you no show three or more times for appointments you may be dismissed from the clinic at the providers discretion.     Again, thank you for choosing [facility].  Our hope is that these requests will decrease the amount of time that you wait before being seen by our physicians.       _____________________________________________________________  Should you have questions after your visit to [facility], please contact our office at [phone] between the hours of 8:00 a.m. and 4:30 p.m.  Voicemails left after 4:00 p.m. will not be returned until the following business day.  For prescription refill requests, have your pharmacy contact our office and allow 72 hours.    Cancer Center Support Programs:   > Cancer Support Group  2nd Tuesday of the month 1pm-2pm, Journey Room

## 2020-10-06 NOTE — Progress Notes (Signed)
Reload trastuzumab due to >7 days since last treatment.  Today's dose is 8 mg/kg.  T.O. Dr Rhys Martini, PharmD

## 2020-10-06 NOTE — Progress Notes (Signed)
[place], [place] [address]   CLINIC:  Medical Oncology/Hematology  PCP:  Patient, No Pcp Per None None   REASON FOR VISIT:  Follow-up for HER-2 positive right breast cancer  PRIOR THERAPY:  1. Neoadjuvant TCHP x 6 cycles from 08/27/2019 to 12/10/2019. 2. Bilateral mastectomies on 01/24/2020.  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 [facility])   08/27/2019 - 12/12/2019 Chemotherapy   The patient had palonosetron (ALOXI) injection 0.25 mg, 0.25 mg, Intravenous,  Once, 6 of 6 cycles Administration: 0.25 mg (08/27/2019), 0.25 mg (09/17/2019), 0.25 mg (11/19/2019), 0.25 mg (12/10/2019), 0.25 mg (10/08/2019), 0.25 mg (10/29/2019) pegfilgrastim-jmdb (FULPHILA) injection 6 mg, 6 mg, Subcutaneous,  Once, 6 of 6 cycles Administration: 6 mg (08/29/2019), 6 mg (09/19/2019), 6 mg (11/21/2019), 6 mg (12/12/2019), 6 mg (10/10/2019), 6 mg (10/31/2019) trastuzumab (HERCEPTIN) 600 mg in sodium chloride 0.9 % 250 mL chemo infusion, 567 mg (100 % of original dose 8 mg/kg), Intravenous,  Once, 1 of 1 cycle Dose modification: 8 mg/kg (original dose 8 mg/kg, Cycle 1, Reason: Other (see comments), Comment: using up last of our Herceptin at [facility]) Administration: 600 mg (08/27/2019) CARBOplatin (PARAPLATIN) 780 mg in sodium chloride 0.9 % 250 mL chemo infusion, 780 mg (100 % of original dose 784.8 mg), Intravenous,  Once, 6 of 6 cycles Dose modification:   (original dose 784.8 mg, Cycle 1),   (original dose 784.8 mg, Cycle 2),   (original dose 784.8 mg, Cycle 5), 784.8 mg (original dose 784.8 mg, Cycle 5, Reason: Other (see comments), Comment: scr 0.8 entered),   (original dose 784.8 mg, Cycle  6),   (original dose 784.8 mg, Cycle 3),   (original dose 784.8 mg, Cycle 4) Administration: 780 mg (08/27/2019), 780 mg (09/17/2019), 780 mg (11/19/2019), 780 mg (12/10/2019), 780 mg (10/08/2019), 780 mg (10/29/2019) DOCEtaxel (TAXOTERE) 130 mg in sodium chloride 0.9 % 250 mL chemo infusion, 75 mg/m2 = 130 mg, Intravenous,  Once, 6 of 6 cycles Administration: 130 mg (08/27/2019), 130 mg (09/17/2019), 130 mg (11/19/2019), 130 mg (12/10/2019), 130 mg (10/08/2019), 130 mg (10/29/2019) pertuzumab (PERJETA) 840 mg in sodium chloride 0.9 % 250 mL chemo infusion, 840 mg, Intravenous, Once, 6 of 6 cycles Administration: 840 mg (08/27/2019), 420 mg (09/17/2019), 420 mg (11/19/2019), 420 mg (12/10/2019), 420 mg (10/08/2019), 420 mg (10/29/2019) fosaprepitant (EMEND) 150 mg, dexamethasone (DECADRON) 12 mg in sodium chloride 0.9 % 145 mL IVPB, , Intravenous,  Once, 6 of 6 cycles Administration:  (08/27/2019),  (09/17/2019),  (11/19/2019),  (12/10/2019),  (10/08/2019),  (10/29/2019) trastuzumab-dkst (OGIVRI) 450 mg in sodium chloride 0.9 % 250 mL chemo infusion, 420 mg, Intravenous,  Once, 5 of 5 cycles Administration: 450 mg (09/17/2019), 450 mg (11/19/2019), 420 mg (12/10/2019), 450 mg (10/08/2019), 450 mg (10/29/2019)  for chemotherapy treatment.    01/02/2020 -  Chemotherapy   The patient had trastuzumab (HERCEPTIN) 525 mg in sodium chloride 0.9 % 250 mL chemo infusion, 8 mg/kg = 525 mg (100 % of original dose 8 mg/kg), Intravenous,  Once, 1 of 1 cycle Dose modification: 8 mg/kg (original dose 8 mg/kg, Cycle 6, Reason: Provider Judgment, Comment: Reload per MD due to delay in 1 week from last tx) Administration: 525 mg (04/24/2020) pertuzumab (PERJETA) 420 mg in  sodium chloride 0.9 % 250 mL chemo infusion, 420 mg (100 % of original dose 420 mg), Intravenous, Once, 11 of 12 cycles Dose modification: 420 mg (original dose 420 mg, Cycle 1, Reason: Provider Judgment) Administration: 420 mg (01/02/2020), 420 mg (01/23/2020), 420  mg (03/26/2020), 420 mg (04/24/2020), 420 mg (05/15/2020), 420 mg (06/05/2020), 420 mg (06/26/2020), 420 mg (07/20/2020), 420 mg (02/13/2020), 420 mg (03/05/2020), 420 mg (08/10/2020) trastuzumab-dkst (OGIVRI) 450 mg in sodium chloride 0.9 % 250 mL chemo infusion, 420 mg (100 % of original dose 6 mg/kg), Intravenous,  Once, 11 of 12 cycles Dose modification: 6 mg/kg (original dose 6 mg/kg, Cycle 1, Reason: Provider Judgment), 8 mg/kg (original dose 6 mg/kg, Cycle 12, Reason: Provider Judgment, Comment: missed dose >7 days) Administration: 450 mg (01/02/2020), 420 mg (01/23/2020), 420 mg (03/26/2020), 420 mg (05/15/2020), 420 mg (06/05/2020), 420 mg (06/26/2020), 420 mg (07/20/2020), 420 mg (02/13/2020), 420 mg (03/05/2020), 420 mg (08/10/2020)  for chemotherapy treatment.      CANCER STAGING: Cancer Staging No matching staging information was found for the patient.  INTERVAL HISTORY:  Monica Hancock, a 39 y.o. female, returns for routine follow-up and consideration for final cycle of immunotherapy. Monica Hancock was last seen on 07/20/2020.  Due for cycle #12 of pertuzumab and trastuzumab today.   Overall, she tells me she has been feeling pretty well. She is tolerating the Arimidex well and reports occasional hot flashes, but denies new aches or pains. She reports that the black cohosh helps with her hot flashes and will get about 4 episodes of hot flashes during the day and rarely overnight. She is tolerating the pertuzumab and trastuzumab well and denies having diarrhea or orthopnea. She did not come for treatment in October since she contracted COVID; she was not hospitalized and reports having headache, chills, mild cough, and body aches. She did not get the COVID vaccine. She complains of feeling cold in the soles of her feet when she is exposed to cold. She also reports having cramping in her mouth when she is upset, as well as rare sharp stabbing pain in the right side of the back of her head.  Overall, she  feels ready for final cycle of immunotherapy today.    REVIEW OF SYSTEMS:  Review of Systems  Constitutional: Positive for fatigue (80%). Negative for appetite change.  Respiratory: Negative for shortness of breath.   Gastrointestinal: Negative for diarrhea.  Endocrine: Positive for hot flashes (x4 episdoes during day; on black cohosh).  Musculoskeletal: Negative for arthralgias.  Neurological: Positive for numbness (cold sensation in soles of her feet when exposed to cold).    PAST MEDICAL/SURGICAL HISTORY:  Past Medical History:  Diagnosis Date  . BRCA1 positive   . Cancer [facility])    Right Breast  . Family history of adverse reaction to anesthesia    PONV-children  . Family history of BRCA1 gene positive   . Family history of breast cancer   . H/O Bell's palsy 2000   Left sided  . HSV infection    Past Surgical History:  Procedure Laterality Date  . BREAST BIOPSY Right   . IR IMAGING GUIDED PORT INSERTION  08/26/2019   Right  . MASTECTOMY MODIFIED RADICAL Right 01/24/2020   Procedure: RIGHT MASTECTOMY MODIFIED RADICAL;  Surgeon: Aviva Signs, MD;  Location: [facility];  Service: General;  Laterality: Right;  . SALPINGOOPHORECTOMY Bilateral 04/14/2020   Procedure: OPEN BILATERAL SALPINGO OOPHORECTOMY;  Surgeon: [name], MD;  Location: [facility];  Service:  Gynecology;  Laterality: Bilateral;  . SIMPLE MASTECTOMY WITH AXILLARY SENTINEL NODE BIOPSY Left 01/24/2020   Procedure: LEFT SIMPLE MASTECTOMY;  Surgeon: Aviva Signs, MD;  Location: [facility];  Service: General;  Laterality: Left;  . SUPRACERVICAL ABDOMINAL HYSTERECTOMY N/A 04/14/2020   Procedure: HYSTERECTOMY SUPRACERVICAL ABDOMINAL;  Surgeon: [name], MD;  Location: [facility];  Service: Gynecology;  Laterality: N/A;  . TUBAL LIGATION      SOCIAL HISTORY:  Social History   Socioeconomic History  . Marital status: Married    Spouse name: [name]  . Number of children: 5  . Years of education: Not on file  . Highest  education level: 12th grade  Occupational History  . Not on file  Tobacco Use  . Smoking status: Never Smoker  . Smokeless tobacco: Never Used  Vaping Use  . Vaping Use: Never used  Substance and Sexual Activity  . Alcohol use: No  . Drug use: No  . Sexual activity: Not Currently    Birth control/protection: Surgical    Comment: [facility]  Other Topics Concern  . Not on file  Social History Narrative  . Not on file   Social Determinants of Health   Financial Resource Strain: Low Risk   . Difficulty of Paying Living Expenses: Not hard at all  Food Insecurity: No Food Insecurity  . Worried About Charity fundraiser in the Last Year: Never true  . Ran Out of Food in the Last Year: Never true  Transportation Needs: No Transportation Needs  . Lack of Transportation (Medical): No  . Lack of Transportation (Non-Medical): No  Physical Activity: Inactive  . Days of Exercise per Week: 0 days  . Minutes of Exercise per Session: 0 min  Stress: No Stress Concern Present  . Feeling of Stress : Not at all  Social Connections: Moderately Isolated  . Frequency of Communication with Friends and Family: More than three times a week  . Frequency of Social Gatherings with Friends and Family: Three times a week  . Attends Religious Services: Never  . Active Member of Clubs or Organizations: No  . Attends Archivist Meetings: Never  . Marital Status: Married  Human resources officer Violence: Not At Risk  . Fear of Current or Ex-Partner: No  . Emotionally Abused: No  . Physically Abused: No  . Sexually Abused: No    FAMILY HISTORY:  Family History  Problem Relation Age of Onset  . Diabetes Mother   . Breast cancer Mother 27  . Cancer Mother   . Diabetes Maternal Grandmother   . Stomach cancer Maternal Grandfather   . Breast cancer Maternal Aunt        dx in her 35s  . Diabetes Maternal Aunt     CURRENT MEDICATIONS:  Current Outpatient Medications  Medication Sig Dispense Refill    . acetaminophen (TYLENOL) 325 MG tablet Take 650 mg by mouth every 6 (six) hours as needed.     Marland Kitchen anastrozole (ARIMIDEX) 1 MG tablet Take 1 mg by mouth daily.     . cephALEXin (KEFLEX) 500 MG capsule Take 1 capsule (500 mg total) by mouth 2 (two) times daily for 10 days. 20 capsule 0  . diphenhydrAMINE (BENADRYL) 50 MG tablet Take 50 mg by mouth every 8 (eight) hours as needed for allergies.     . ferrous sulfate (FERROUSUL) 325 (65 FE) MG tablet Take 1 tablet (325 mg total) by mouth daily with breakfast. 90 tablet 1  . ibuprofen (ADVIL) 600  MG tablet Take 1 tablet (600 mg total) by mouth every 6 (six) hours as needed for fever or headache. 30 tablet 0  . lidocaine-prilocaine (EMLA) cream Apply 1 application topically See admin instructions. Apply small amount to port a cath and cover with plastic wrap 1 hour prior to chemotherapy    . loratadine (CLARITIN) 10 MG tablet Take 10 mg by mouth daily as needed for allergies.     . magnesium oxide (MAGNESIUM-OXIDE) 400 (241.3 Mg) MG tablet TAKE 1 TABLET(400 MG) BY MOUTH TWICE DAILY 60 tablet 2  . oxyCODONE (OXY IR/ROXICODONE) 5 MG immediate release tablet Take 1-2 tablets (5-10 mg total) by mouth every 4 (four) hours as needed for moderate pain. 30 tablet 0  . pertuzumab in sodium chloride 0.9 % 250 mL Inject into the vein every 21 ( twenty-one) days.    . potassium chloride (KLOR-CON) 10 MEQ tablet TAKE 2 TABLETS(20 MEQ) BY MOUTH TWICE DAILY 120 tablet 3  . prochlorperazine (COMPAZINE) 10 MG tablet Take 1 tablet (10 mg total) by mouth every 6 (six) hours as needed for nausea or vomiting. 30 tablet 3  . trastuzumab-dkst 2 mg/kg in sodium chloride 0.9 % 250 mL Inject into the vein every 21 ( twenty-one) days.     Current Facility-Administered Medications  Medication Dose Route Frequency Provider Last Rate Last Admin  . influenza vac split quadrivalent PF (FLUARIX) injection 0.5 mL  0.5 mL Intramuscular Once Derek Jack, MD        ALLERGIES:   No Known Allergies  PHYSICAL EXAM:  Performance status (ECOG): 0 - Asymptomatic  Vitals:   10/06/20 0856  BP: 116/73  Pulse: 66  Resp: 18  Temp: (!) 96.8 F (36 C)  SpO2: 100%   Wt Readings from Last 3 Encounters:  10/06/20 153 lb (69.4 kg)  08/10/20 156 lb 3.2 oz (70.9 kg)  07/20/20 154 lb 6.4 oz (70 kg)   Physical Exam Vitals reviewed.  Constitutional:      Appearance: Normal appearance. She is obese.  Cardiovascular:     Rate and Rhythm: Normal rate and regular rhythm.     Pulses: Normal pulses.     Heart sounds: Normal heart sounds.  Pulmonary:     Effort: Pulmonary effort is normal.     Breath sounds: Normal breath sounds.  Chest:     Breasts:        Right: Absent. Tenderness: numb over mastectomy site.        Left: Normal.     Comments: Port-a-Cath in R chest Neurological:     General: No focal deficit present.     Mental Status: She is alert and oriented to person, place, and time.  Psychiatric:        Mood and Affect: Mood normal.        Behavior: Behavior normal.     LABORATORY DATA:  I have reviewed the labs as listed.  CBC Latest Ref Rng & Units 07/17/2020 06/26/2020 06/05/2020  WBC 4.0 - 10.5 K/uL 6.1 6.5 6.0  Hemoglobin 12.0 - 15.0 g/dL 13.5 13.1 12.6  Hematocrit 36 - 46 % 41.0 40.3 38.8  Platelets 150 - 400 K/uL 284 318 304   CMP Latest Ref Rng & Units 07/17/2020 06/26/2020 06/05/2020  Glucose 70 - 99 mg/dL 113(H) 104(H) 110(H)  BUN 6 - 20 mg/dL 13 19 13   Creatinine 0.44 - 1.00 mg/dL 0.63 0.66 0.57  Sodium 135 - 145 mmol/L 137 138 139  Potassium 3.5 - 5.1 mmol/L 3.7  3.9 3.8  Chloride 98 - 111 mmol/L 105 104 104  CO2 22 - 32 mmol/L 25 25 26   Calcium 8.9 - 10.3 mg/dL 10.1 10.2 10.2  Total Protein 6.5 - 8.1 g/dL 7.6 7.8 7.6  Total Bilirubin 0.3 - 1.2 mg/dL 0.8 0.6 0.6  Alkaline Phos 38 - 126 U/L 78 81 82  AST 15 - 41 U/L 20 19 18   ALT 0 - 44 U/L 30 28 26     DIAGNOSTIC IMAGING:  I have independently reviewed the scans and discussed with the  patient. No results found.   ASSESSMENT:  1. 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 -Herceptin and Pertuzumab maintenance started on 01/02/2020. -Bilateral mastectomies on 01/24/2020. Pathology showed YPT0Y PN 0, 0/6 lymph nodes involved. -She met with Dr. Sondra Come who did not recommend adjuvant radiation. -TAH/BSO on 04/14/2020. -Anastrozole to start on 04/24/2020.  2. BRCA1 positive: -Bilateral mastectomies on 01/24/2020. -TAH/BSO on 04/14/2020.   PLAN:  1. Clinical T2N0 right breast IDC, ER positive, HER-2 positive by FISH: -She is tolerating anastrozole reasonably well.  Hot flashes improved. -She missed her previous appointment-she had Covid in October. -I have reviewed her labs which are grossly within normal limits. -Recommend last dose of Herceptin and Pertuzumab today.  Her hair is coming back as patchy areas. -RTC 4 months with labs.  2. BRCA1 positive: -Completed bilateral mastectomies and TAH/BSO.  3. Hypokalemia: -Potassium is 3.7.  Continue potassium 40 mEq daily.  4. Hypomagnesemia: -Magnesium is 1.9.  Continue magnesium twice daily.  5. High risk drug monitoring: -Echocardiogram on 08/03/2020 reviewed by me shows EF 65-70%.  6.  Hot flashes: -She started taking black cohosh which is helping.  She now has hot flashes 1 to 2/day and at nighttime.  7.  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 -Electrolytes are within normal limits.  Etiology not known at this time.   Orders placed this encounter:  Orders Placed This Encounter  Procedures  . CBC with Differential/Platelet  . Comprehensive metabolic panel  . Magnesium  . Cancer antigen 15-3  . Magnesium     Derek Jack, MD [place] [phone]   I, Milinda Antis, am acting as a scribe for Dr. Sanda Linger.  I,  Derek Jack MD, have reviewed the above documentation for accuracy and completeness, and I agree with the above.

## 2020-10-06 NOTE — Progress Notes (Signed)
Patient request flu shot today.  Patient ok to receive flu shot per MD orders.   Administered to Left deltoid.  Tolerated well. Patient ambulatory and in stable condition.

## 2021-01-18 ENCOUNTER — Other Ambulatory Visit (HOSPITAL_COMMUNITY): Payer: Self-pay | Admitting: Hematology

## 2021-01-27 ENCOUNTER — Inpatient Hospital Stay (HOSPITAL_COMMUNITY): Payer: Medicaid Other | Attending: Hematology

## 2021-01-27 ENCOUNTER — Encounter (HOSPITAL_COMMUNITY): Payer: Self-pay

## 2021-01-27 ENCOUNTER — Other Ambulatory Visit: Payer: Self-pay

## 2021-01-27 DIAGNOSIS — Z9013 Acquired absence of bilateral breasts and nipples: Secondary | ICD-10-CM | POA: Insufficient documentation

## 2021-01-27 DIAGNOSIS — Z1509 Genetic susceptibility to other malignant neoplasm: Secondary | ICD-10-CM | POA: Diagnosis not present

## 2021-01-27 DIAGNOSIS — Z1502 Genetic susceptibility to malignant neoplasm of ovary: Secondary | ICD-10-CM | POA: Diagnosis not present

## 2021-01-27 DIAGNOSIS — Z1501 Genetic susceptibility to malignant neoplasm of breast: Secondary | ICD-10-CM | POA: Insufficient documentation

## 2021-01-27 DIAGNOSIS — D509 Iron deficiency anemia, unspecified: Secondary | ICD-10-CM | POA: Diagnosis not present

## 2021-01-27 DIAGNOSIS — K5900 Constipation, unspecified: Secondary | ICD-10-CM | POA: Insufficient documentation

## 2021-01-27 DIAGNOSIS — Z79811 Long term (current) use of aromatase inhibitors: Secondary | ICD-10-CM | POA: Insufficient documentation

## 2021-01-27 DIAGNOSIS — Z79899 Other long term (current) drug therapy: Secondary | ICD-10-CM | POA: Diagnosis not present

## 2021-01-27 DIAGNOSIS — Z9221 Personal history of antineoplastic chemotherapy: Secondary | ICD-10-CM | POA: Insufficient documentation

## 2021-01-27 DIAGNOSIS — Z170 Estrogen receptor positive status [ER+]: Secondary | ICD-10-CM

## 2021-01-27 DIAGNOSIS — R232 Flushing: Secondary | ICD-10-CM | POA: Insufficient documentation

## 2021-01-27 DIAGNOSIS — Z148 Genetic carrier of other disease: Secondary | ICD-10-CM | POA: Diagnosis not present

## 2021-01-27 DIAGNOSIS — E876 Hypokalemia: Secondary | ICD-10-CM | POA: Diagnosis not present

## 2021-01-27 DIAGNOSIS — C50411 Malignant neoplasm of upper-outer quadrant of right female breast: Secondary | ICD-10-CM | POA: Diagnosis present

## 2021-01-27 LAB — COMPREHENSIVE METABOLIC PANEL
ALT: 27 U/L (ref 0–44)
AST: 20 U/L (ref 15–41)
Albumin: 4 g/dL (ref 3.5–5.0)
Alkaline Phosphatase: 81 U/L (ref 38–126)
Anion gap: 8 (ref 5–15)
BUN: 17 mg/dL (ref 6–20)
CO2: 25 mmol/L (ref 22–32)
Calcium: 10 mg/dL (ref 8.9–10.3)
Chloride: 106 mmol/L (ref 98–111)
Creatinine, Ser: 0.68 mg/dL (ref 0.44–1.00)
GFR, Estimated: >60 mL/min (ref >60)
Glucose, Bld: 109 mg/dL — ABNORMAL HIGH (ref 70–99)
Potassium: 3.4 mmol/L — ABNORMAL LOW (ref 3.5–5.1)
Sodium: 139 mmol/L (ref 135–145)
Total Bilirubin: 0.6 mg/dL (ref 0.3–1.2)
Total Protein: 7.5 g/dL (ref 6.5–8.1)

## 2021-01-27 LAB — CBC WITH DIFFERENTIAL/PLATELET
Abs Immature Granulocytes: 0.02 10*3/uL (ref 0.00–0.07)
Basophils Absolute: 0 10*3/uL (ref 0.0–0.1)
Basophils Relative: 0 %
Eosinophils Absolute: 0.1 10*3/uL (ref 0.0–0.5)
Eosinophils Relative: 1 %
HCT: 39.6 % (ref 36.0–46.0)
Hemoglobin: 13.1 g/dL (ref 12.0–15.0)
Immature Granulocytes: 0 %
Lymphocytes Relative: 43 %
Lymphs Abs: 3 10*3/uL (ref 0.7–4.0)
MCH: 30.3 pg (ref 26.0–34.0)
MCHC: 33.1 g/dL (ref 30.0–36.0)
MCV: 91.5 fL (ref 80.0–100.0)
Monocytes Absolute: 0.4 10*3/uL (ref 0.1–1.0)
Monocytes Relative: 6 %
Neutro Abs: 3.5 10*3/uL (ref 1.7–7.7)
Neutrophils Relative %: 50 %
Platelets: 293 10*3/uL (ref 150–400)
RBC: 4.33 MIL/uL (ref 3.87–5.11)
RDW: 12.2 % (ref 11.5–15.5)
WBC: 7 10*3/uL (ref 4.0–10.5)
nRBC: 0 % (ref 0.0–0.2)

## 2021-01-27 LAB — MAGNESIUM: Magnesium: 2 mg/dL (ref 1.7–2.4)

## 2021-01-27 MED ORDER — HEPARIN SOD (PORK) LOCK FLUSH 100 UNIT/ML IV SOLN
500.0000 [IU] | Freq: Once | INTRAVENOUS | Status: AC
  Administered 2021-01-27: 500 [IU] via INTRAVENOUS

## 2021-01-27 MED ORDER — SODIUM CHLORIDE 0.9% FLUSH
10.0000 mL | INTRAVENOUS | Status: DC | PRN
  Administered 2021-01-27: 10 mL via INTRAVENOUS

## 2021-01-27 NOTE — Progress Notes (Signed)
Monica Hancock presented for Portacath access and flush.  Portacath located right chest wall accessed with  H 20 needle.  Good blood return present. Portacath flushed with 79ml NS and 500U/22ml Heparin and needle removed intact.  Procedure tolerated well and without incident.  Discharged ambulatory in stable condition.

## 2021-01-28 LAB — CANCER ANTIGEN 15-3: CA 15-3: 19.4 U/mL (ref 0.0–25.0)

## 2021-02-03 ENCOUNTER — Inpatient Hospital Stay (HOSPITAL_BASED_OUTPATIENT_CLINIC_OR_DEPARTMENT_OTHER): Payer: Medicaid Other | Admitting: Hematology

## 2021-02-03 ENCOUNTER — Other Ambulatory Visit: Payer: Self-pay

## 2021-02-03 VITALS — BP 116/81 | HR 89 | Temp 98.3°F | Resp 18 | Wt 162.2 lb

## 2021-02-03 DIAGNOSIS — C50411 Malignant neoplasm of upper-outer quadrant of right female breast: Secondary | ICD-10-CM

## 2021-02-03 DIAGNOSIS — Z170 Estrogen receptor positive status [ER+]: Secondary | ICD-10-CM

## 2021-02-03 MED ORDER — VENLAFAXINE HCL 37.5 MG PO TABS: 75.0000 mg | ORAL_TABLET | Freq: Every day | ORAL | 5 refills | Status: DC

## 2021-02-03 MED ORDER — LIDOCAINE-PRILOCAINE 2.5-2.5 % EX CREA: 1.0000 "application " | TOPICAL_CREAM | CUTANEOUS | 1 refills | Status: DC

## 2021-02-03 NOTE — Progress Notes (Signed)
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 [facility])   08/27/2019 - 12/12/2019 Chemotherapy   The patient had palonosetron (ALOXI) injection 0.25 mg, 0.25 mg, Intravenous,  Once, 6 of 6 cycles Administration: 0.25 mg (08/27/2019), 0.25 mg (09/17/2019), 0.25 mg (11/19/2019), 0.25 mg (12/10/2019), 0.25 mg (10/08/2019), 0.25 mg (10/29/2019) pegfilgrastim-jmdb (FULPHILA) injection 6 mg, 6 mg, Subcutaneous,  Once, 6 of 6 cycles Administration: 6 mg (08/29/2019), 6 mg (09/19/2019), 6 mg (11/21/2019), 6 mg (12/12/2019), 6 mg (10/10/2019), 6 mg (10/31/2019) trastuzumab (HERCEPTIN) 600 mg in sodium chloride 0.9 % 250 mL chemo infusion, 567 mg (100 % of original dose 8 mg/kg), Intravenous,  Once, 1 of 1 cycle Dose modification: 8 mg/kg (original dose 8 mg/kg, Cycle 1, Reason: Other (see comments), Comment: using up last of our Herceptin at [facility]) Administration: 600 mg (08/27/2019) CARBOplatin (PARAPLATIN) 780 mg in sodium chloride 0.9 % 250 mL chemo infusion, 780 mg (100 % of original dose 784.8 mg), Intravenous,  Once, 6 of 6 cycles Dose modification:   (original dose 784.8 mg, Cycle 1),   (original dose 784.8 mg, Cycle 2),   (original dose 784.8 mg, Cycle 5), 784.8 mg (original dose 784.8 mg, Cycle 5, Reason: Other (see comments), Comment: scr 0.8 entered),   (original dose 784.8 mg, Cycle 6),   (original dose 784.8 mg, Cycle 3),   (original dose 784.8 mg, Cycle 4) Administration: 780 mg (08/27/2019), 780 mg (09/17/2019), 780 mg (11/19/2019), 780 mg (12/10/2019), 780 mg (10/08/2019), 780 mg (10/29/2019) DOCEtaxel (TAXOTERE) 130 mg in sodium chloride 0.9 % 250 mL chemo infusion, 75 mg/m2 =  130 mg, Intravenous,  Once, 6 of 6 cycles Administration: 130 mg (08/27/2019), 130 mg (09/17/2019), 130 mg (11/19/2019), 130 mg (12/10/2019), 130 mg (10/08/2019), 130 mg (10/29/2019) pertuzumab (PERJETA) 840 mg in sodium chloride 0.9 % 250 mL chemo infusion, 840 mg, Intravenous, Once, 6 of 6 cycles Administration: 840 mg (08/27/2019), 420 mg (09/17/2019), 420 mg (11/19/2019), 420 mg (12/10/2019), 420 mg (10/08/2019), 420 mg (10/29/2019) fosaprepitant (EMEND) 150 mg, dexamethasone (DECADRON) 12 mg in sodium chloride 0.9 % 145 mL IVPB, , Intravenous,  Once, 6 of 6 cycles Administration:  (08/27/2019),  (09/17/2019),  (11/19/2019),  (12/10/2019),  (10/08/2019),  (10/29/2019) trastuzumab-dkst (OGIVRI) 450 mg in sodium chloride 0.9 % 250 mL chemo infusion, 420 mg, Intravenous,  Once, 5 of 5 cycles Administration: 450 mg (09/17/2019), 450 mg (11/19/2019), 420 mg (12/10/2019), 450 mg (10/08/2019), 450 mg (10/29/2019)  for chemotherapy treatment.    01/02/2020 -  Chemotherapy   The patient had trastuzumab (HERCEPTIN) 525 mg in sodium chloride 0.9 % 250 mL chemo infusion, 8 mg/kg = 525 mg (100 % of original dose 8 mg/kg), Intravenous,  Once, 1 of 1 cycle Dose modification: 8 mg/kg (original dose 8 mg/kg, Cycle 6, Reason: Provider Judgment, Comment: Reload per MD due to delay in 1 week from last tx) Administration: 525 mg (04/24/2020) pertuzumab (PERJETA) 420 mg in sodium chloride 0.9 % 250 mL chemo infusion, 420 mg (100 % of original dose 420 mg), Intravenous, Once, 12 of 12 cycles Dose modification: 420 mg (original dose 420 mg, Cycle 1, Reason: Provider Judgment) Administration: 420 mg (01/02/2020), 420 mg (01/23/2020), 420 mg (03/26/2020), 420  mg (04/24/2020), 420 mg (05/15/2020), 420 mg (06/05/2020), 420 mg (06/26/2020), 420 mg (07/20/2020), 420 mg (02/13/2020), 420 mg (03/05/2020), 420 mg (08/10/2020), 420 mg (10/06/2020) trastuzumab-dkst (OGIVRI) 450 mg in sodium chloride 0.9 % 250 mL chemo infusion, 420 mg (100 % of original dose  6 mg/kg), Intravenous,  Once, 12 of 12 cycles Dose modification: 6 mg/kg (original dose 6 mg/kg, Cycle 1, Reason: Provider Judgment), 8 mg/kg (original dose 6 mg/kg, Cycle 12, Reason: Provider Judgment, Comment: missed dose >7 days) Administration: 450 mg (01/02/2020), 420 mg (01/23/2020), 420 mg (03/26/2020), 420 mg (05/15/2020), 420 mg (06/05/2020), 420 mg (06/26/2020), 420 mg (07/20/2020), 420 mg (02/13/2020), 420 mg (03/05/2020), 420 mg (08/10/2020), 600 mg (10/06/2020)  for chemotherapy treatment.      CHIEF COMPLIANT: Follow-up for right breast cancer   INTERVAL HISTORY: Monica Hancock is a 40 y.o. female here today for follow up of her right breast cancer. Her last visit was on 10/06/2020.   Today she reports feeling well. She is taking Arimidex and tolerating it well, though she continues having hot flashes during the day and night and reports that the black cohosh is not helping, but denies having joint aches or pains. She has never tried taking any antidepressants. She is taking iron tablet daily and reports having constipation. She is wondering if she will need to keep the port in place, though it does not bother her.   REVIEW OF SYSTEMS:   Review of Systems  Constitutional: Positive for fatigue (75%). Negative for appetite change.  Gastrointestinal: Positive for constipation.  Endocrine: Positive for hot flashes (day & night; stable).  Musculoskeletal: Negative for arthralgias and myalgias.  All other systems reviewed and are negative.   I have reviewed the past medical history, past surgical history, social history and family history with the patient and they are unchanged from previous note.   ALLERGIES:   has No Known Allergies.   MEDICATIONS:  Current Outpatient Medications  Medication Sig Dispense Refill  . anastrozole (ARIMIDEX) 1 MG tablet TAKE 1 TABLET(1 MG) BY MOUTH DAILY 30 tablet 6  . ferrous sulfate (FERROUSUL) 325 (65 FE) MG tablet Take 1 tablet (325 mg total)  by mouth daily with breakfast. 90 tablet 1  . loratadine (CLARITIN) 10 MG tablet Take 10 mg by mouth daily as needed for allergies.     . magnesium oxide (MAGNESIUM-OXIDE) 400 (241.3 Mg) MG tablet TAKE 1 TABLET(400 MG) BY MOUTH TWICE DAILY 60 tablet 2  . potassium chloride (KLOR-CON) 10 MEQ tablet TAKE 2 TABLETS(20 MEQ) BY MOUTH TWICE DAILY 120 tablet 3  . prochlorperazine (COMPAZINE) 10 MG tablet Take 1 tablet (10 mg total) by mouth every 6 (six) hours as needed for nausea or vomiting. 30 tablet 3  . trastuzumab-dkst 2 mg/kg in sodium chloride 0.9 % 250 mL Inject into the vein every 21 ( twenty-one) days.    Marland Kitchen venlafaxine (EFFEXOR) 37.5 MG tablet Take 2 tablets (75 mg total) by mouth daily. 60 tablet 5  . acetaminophen (TYLENOL) 325 MG tablet Take 650 mg by mouth every 6 (six) hours as needed.  (Patient not taking: Reported on 02/03/2021)    . diphenhydrAMINE (BENADRYL) 50 MG tablet Take 50 mg by mouth every 8 (eight) hours as needed for allergies.  (Patient not taking: Reported on 02/03/2021)    . ibuprofen (ADVIL) 600 MG tablet Take 1 tablet (600 mg total) by mouth every 6 (six) hours as needed for fever or headache. (Patient not taking: Reported on 02/03/2021) 30  tablet 0  . lidocaine-prilocaine (EMLA) cream Apply 1 application topically See admin instructions. Apply small amount to port a cath and cover with plastic wrap 1 hour prior to chemotherapy (Patient not taking: Reported on 02/03/2021)    . oxyCODONE (OXY IR/ROXICODONE) 5 MG immediate release tablet Take 1-2 tablets (5-10 mg total) by mouth every 4 (four) hours as needed for moderate pain. (Patient not taking: Reported on 02/03/2021) 30 tablet 0  . pertuzumab in sodium chloride 0.9 % 250 mL Inject into the vein every 21 ( twenty-one) days. (Patient not taking: Reported on 02/03/2021)     No current facility-administered medications for this visit.     PHYSICAL EXAMINATION: Performance status (ECOG): 0 - Asymptomatic  Vitals:   02/03/21  1432  BP: 116/81  Pulse: 89  Resp: 18  Temp: 98.3 F (36.8 C)  SpO2: 98%   Wt Readings from Last 3 Encounters:  02/03/21 162 lb 3.2 oz (73.6 kg)  10/06/20 153 lb (69.4 kg)  08/10/20 156 lb 3.2 oz (70.9 kg)   Physical Exam Vitals reviewed.  Constitutional:      Appearance: Normal appearance. She is obese.  Cardiovascular:     Rate and Rhythm: Normal rate and regular rhythm.     Pulses: Normal pulses.     Heart sounds: Normal heart sounds.  Pulmonary:     Effort: Pulmonary effort is normal.     Breath sounds: Normal breath sounds.  Chest:  Breasts:     Right: Absent. No mass, skin change, tenderness, axillary adenopathy or supraclavicular adenopathy.     Left: Absent. No mass, skin change, tenderness, axillary adenopathy or supraclavicular adenopathy.    Lymphadenopathy:     Cervical: No cervical adenopathy.     Upper Body:     Right upper body: No supraclavicular, axillary or pectoral adenopathy.     Left upper body: No supraclavicular, axillary or pectoral adenopathy.  Neurological:     General: No focal deficit present.     Mental Status: She is alert and oriented to person, place, and time.  Psychiatric:        Mood and Affect: Mood normal.        Behavior: Behavior normal.     Breast Exam Chaperone: Milinda Antis, MD     LABORATORY DATA:  I have reviewed the data as listed CMP Latest Ref Rng & Units 01/27/2021 10/06/2020 07/17/2020  Glucose 70 - 99 mg/dL 109(H) 101(H) 113(H)  BUN 6 - 20 mg/dL 17 15 13   Creatinine 0.44 - 1.00 mg/dL 0.68 0.71 0.63  Sodium 135 - 145 mmol/L 139 138 137  Potassium 3.5 - 5.1 mmol/L 3.4(L) 3.7 3.7  Chloride 98 - 111 mmol/L 106 104 105  CO2 22 - 32 mmol/L 25 26 25   Calcium 8.9 - 10.3 mg/dL 10.0 10.1 10.1  Total Protein 6.5 - 8.1 g/dL 7.5 7.5 7.6  Total Bilirubin 0.3 - 1.2 mg/dL 0.6 0.9 0.8  Alkaline Phos 38 - 126 U/L 81 81 78  AST 15 - 41 U/L 20 20 20   ALT 0 - 44 U/L 27 30 30    Lab Results  Component Value Date   CAN153  19.4 01/27/2021   Lab Results  Component Value Date   WBC 7.0 01/27/2021   HGB 13.1 01/27/2021   HCT 39.6 01/27/2021   MCV 91.5 01/27/2021   PLT 293 01/27/2021   NEUTROABS 3.5 01/27/2021    ASSESSMENT:  1. Clinical T2N0 right breast IDC, ER positive, HER-2 positive by FISH: -  6 cycles of neoadjuvant TCHP from 08/27/2019 through 12/10/2019. -Herceptin and Pertuzumab maintenance started on 01/02/2020. -Bilateral mastectomies on 01/24/2020. Pathology showed YPT0Y PN 0, 0/6 lymph nodes involved. -She met with Dr. Sondra Come who did not recommend adjuvant radiation. -TAH/BSO on 04/14/2020. -Anastrozole to start on 04/24/2020.  2. BRCA1 positive: -Bilateral mastectomies on 01/24/2020. -TAH/BSO on 04/14/2020.   PLAN:  1. 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 -Bilateral mastectomy sites are within normal limits. -Reviewed labs from 01/27/2021.  CA 15-3 was 19.4.  Comprehensive metabolic panel and CBC were within normal limits. -RTC 4 months for follow-up.  2.  Hot flashes: -She tried taking backwash which did not help. -We will start her on Effexor 37.5 mg daily for 1 week with increased to 75 mg daily after that.    3. Hypokalemia: -Potassium today 3.4.  Continue potassium supplements at home.  4. Hypomagnesemia: -Magnesium today is 2.0.  Continue magnesium daily.   Breast Cancer therapy associated bone loss: I have recommended calcium, Vitamin D and weight bearing exercises.   No orders of the defined types were placed in this encounter.  The patient has a good understanding of the overall plan. she agrees with it. she will call with any problems that may develop before the next visit here.    Derek Jack, MD [place] [phone]   I, Milinda Antis, am acting as a scribe for Dr. Sanda Linger.  I, Derek Jack MD, have reviewed the above  documentation for accuracy and completeness, and I agree with the above.

## 2021-02-03 NOTE — Patient Instructions (Signed)
Monica Hancock at [facility] Discharge Instructions  You were seen today by Dr. Delton Coombes. He went over your recent results. You will be prescribed Effexor 37.5 mg to take for 1 week, then increase to 2 tablets daily for your hot flashes. Dr. Delton Coombes will see you back in 4 months for labs and follow up.   Thank you for choosing [place] at [facility] to provide your oncology and hematology care.  To afford each patient quality time with our provider, please arrive at least 15 minutes before your scheduled appointment time.   If you have a lab appointment with the [facility] please come in thru the Main Entrance and check in at the main information desk  You need to re-schedule your appointment should you arrive 10 or more minutes late.  We strive to give you quality time with our providers, and arriving late affects you and other patients whose appointments are after yours.  Also, if you no show three or more times for appointments you may be dismissed from the clinic at the providers discretion.     Again, thank you for choosing [facility].  Our hope is that these requests will decrease the amount of time that you wait before being seen by our physicians.       _____________________________________________________________  Should you have questions after your visit to [facility], please contact our office at [phone] between the hours of 8:00 a.m. and 4:30 p.m.  Voicemails left after 4:00 p.m. will not be returned until the following business day.  For prescription refill requests, have your pharmacy contact our office and allow 72 hours.    Cancer Center Support Programs:   > Cancer Support Group  2nd Tuesday of the month 1pm-2pm, Journey Room

## 2021-02-14 ENCOUNTER — Encounter: Payer: Self-pay | Admitting: Emergency Medicine

## 2021-02-14 ENCOUNTER — Ambulatory Visit: Payer: Self-pay

## 2021-02-14 ENCOUNTER — Other Ambulatory Visit: Payer: Self-pay

## 2021-02-14 ENCOUNTER — Emergency Department (HOSPITAL_COMMUNITY): Payer: Medicaid Other

## 2021-02-14 ENCOUNTER — Emergency Department (HOSPITAL_COMMUNITY)
Admission: EM | Admit: 2021-02-14 | Discharge: 2021-02-14 | Disposition: A | Discharge status: Home / Self Care | Payer: Medicaid Other | Attending: Emergency Medicine | Admitting: Emergency Medicine

## 2021-02-14 ENCOUNTER — Encounter (HOSPITAL_COMMUNITY): Payer: Self-pay | Admitting: Emergency Medicine

## 2021-02-14 ENCOUNTER — Ambulatory Visit
Admission: EM | Admit: 2021-02-14 | Discharge: 2021-02-14 | Disposition: A | Discharge status: Home / Self Care | Payer: Medicaid Other

## 2021-02-14 DIAGNOSIS — R1013 Epigastric pain: Secondary | ICD-10-CM | POA: Insufficient documentation

## 2021-02-14 DIAGNOSIS — R0781 Pleurodynia: Secondary | ICD-10-CM | POA: Insufficient documentation

## 2021-02-14 DIAGNOSIS — R1012 Left upper quadrant pain: Secondary | ICD-10-CM | POA: Diagnosis not present

## 2021-02-14 DIAGNOSIS — Z853 Personal history of malignant neoplasm of breast: Secondary | ICD-10-CM | POA: Insufficient documentation

## 2021-02-14 DIAGNOSIS — I4510 Unspecified right bundle-branch block: Secondary | ICD-10-CM | POA: Diagnosis not present

## 2021-02-14 LAB — URINALYSIS, ROUTINE W REFLEX MICROSCOPIC
Bilirubin Urine: NEGATIVE
Glucose, UA: NEGATIVE mg/dL
Hgb urine dipstick: NEGATIVE
Ketones, ur: NEGATIVE mg/dL
Leukocytes,Ua: NEGATIVE
Nitrite: NEGATIVE
Protein, ur: NEGATIVE mg/dL
Specific Gravity, Urine: 1.008 (ref 1.005–1.030)
pH: 7 (ref 5.0–8.0)

## 2021-02-14 LAB — COMPREHENSIVE METABOLIC PANEL
ALT: 30 U/L (ref 0–44)
AST: 20 U/L (ref 15–41)
Albumin: 3.9 g/dL (ref 3.5–5.0)
Alkaline Phosphatase: 77 U/L (ref 38–126)
Anion gap: 7 (ref 5–15)
BUN: 14 mg/dL (ref 6–20)
CO2: 28 mmol/L (ref 22–32)
Calcium: 10.2 mg/dL (ref 8.9–10.3)
Chloride: 101 mmol/L (ref 98–111)
Creatinine, Ser: 0.63 mg/dL (ref 0.44–1.00)
GFR, Estimated: >60 mL/min (ref >60)
Glucose, Bld: 121 mg/dL — ABNORMAL HIGH (ref 70–99)
Potassium: 3.5 mmol/L (ref 3.5–5.1)
Sodium: 136 mmol/L (ref 135–145)
Total Bilirubin: 0.8 mg/dL (ref 0.3–1.2)
Total Protein: 7.7 g/dL (ref 6.5–8.1)

## 2021-02-14 LAB — CBC WITH DIFFERENTIAL/PLATELET
Abs Immature Granulocytes: 0.02 10*3/uL (ref 0.00–0.07)
Basophils Absolute: 0 10*3/uL (ref 0.0–0.1)
Basophils Relative: 0 %
Eosinophils Absolute: 0.1 10*3/uL (ref 0.0–0.5)
Eosinophils Relative: 2 %
HCT: 41.3 % (ref 36.0–46.0)
Hemoglobin: 13.7 g/dL (ref 12.0–15.0)
Immature Granulocytes: 0 %
Lymphocytes Relative: 46 %
Lymphs Abs: 3.1 10*3/uL (ref 0.7–4.0)
MCH: 30.2 pg (ref 26.0–34.0)
MCHC: 33.2 g/dL (ref 30.0–36.0)
MCV: 91 fL (ref 80.0–100.0)
Monocytes Absolute: 0.5 10*3/uL (ref 0.1–1.0)
Monocytes Relative: 7 %
Neutro Abs: 3.1 10*3/uL (ref 1.7–7.7)
Neutrophils Relative %: 45 %
Platelets: 288 10*3/uL (ref 150–400)
RBC: 4.54 MIL/uL (ref 3.87–5.11)
RDW: 12 % (ref 11.5–15.5)
WBC: 6.8 10*3/uL (ref 4.0–10.5)
nRBC: 0 % (ref 0.0–0.2)

## 2021-02-14 LAB — LIPASE, BLOOD: Lipase: 28 U/L (ref 11–51)

## 2021-02-14 LAB — TROPONIN I (HIGH SENSITIVITY): Troponin I (High Sensitivity): <2 ng/L (ref <18)

## 2021-02-14 MED ORDER — PANTOPRAZOLE SODIUM 20 MG PO TBEC: 20.0000 mg | DELAYED_RELEASE_TABLET | Freq: Every day | ORAL | 0 refills | Status: DC

## 2021-02-14 MED ORDER — ALUM & MAG HYDROXIDE-SIMETH 200-200-20 MG/5ML PO SUSP
30.0000 mL | Freq: Once | ORAL | Status: AC
  Administered 2021-02-14: 30 mL via ORAL
  Filled 2021-02-14: qty 30

## 2021-02-14 MED ORDER — LIDOCAINE VISCOUS HCL 2 % MT SOLN
15.0000 mL | Freq: Once | OROMUCOSAL | Status: AC
  Administered 2021-02-14: 15 mL via ORAL
  Filled 2021-02-14: qty 15

## 2021-02-14 MED ORDER — IOHEXOL 350 MG/ML SOLN
100.0000 mL | Freq: Once | INTRAVENOUS | Status: AC | PRN
  Administered 2021-02-14: 100 mL via INTRAVENOUS

## 2021-02-14 MED ORDER — LIDOCAINE 5 % EX PTCH: 1.0000 | MEDICATED_PATCH | CUTANEOUS | 0 refills | Status: DC

## 2021-02-14 MED ORDER — PANTOPRAZOLE SODIUM 40 MG IV SOLR
40.0000 mg | Freq: Once | INTRAVENOUS | Status: AC
  Administered 2021-02-14: 40 mg via INTRAVENOUS
  Filled 2021-02-14: qty 40

## 2021-02-14 MED ORDER — SODIUM CHLORIDE 0.9 % IV BOLUS: 1000.0000 mL | Freq: Once | INTRAVENOUS | Status: DC

## 2021-02-14 NOTE — Discharge Instructions (Addendum)
At this time there does not appear to be the presence of an emergent medical condition, however there is always the potential for conditions to change. Please read and follow the below instructions.  Please return to the Emergency Department immediately for any new or worsening symptoms. Please call your primary care provider and your oncologist today to schedule follow-up appointments for next week. Please call the cardiologist on discharge paperwork today to schedule a follow-up appointment for further evaluation of your right bundle branch block. Please begin taking the medication Protonix to help with possible acid reflux related symptoms.  Please drink plenty water and get plenty of rest. You may use the Lidoderm patch as prescribed to help with your symptoms.  Lidoderm may be expensive so you may speak with your pharmacist about finding over-the-counter medications that work similarly.  Go to the nearest Emergency Department immediately if: You have fever or chills You cannot stop vomiting. Your pain is only in areas of your belly, such as the right side or the left lower part of the belly. You have bloody or black poop, or poop that looks like tar. You have very bad pain, cramping, or bloating in your belly. You have signs of not having enough fluid or water in your body (dehydration), such as: Dark pee, very little pee, or no pee. Cracked lips. Dry mouth. Sunken eyes. Sleepiness. Weakness. You have trouble breathing or chest pain. You have any new/concerning or worsening of symptoms    Please read the additional information packets attached to your discharge summary.  Do not take your medicine if  develop an itchy rash, swelling in your mouth or lips, or difficulty breathing; call 911 and seek immediate emergency medical attention if this occurs.  You may review your lab tests and imaging results in their entirety on your MyChart account.  Please discuss all results of fully  with your primary care provider and other specialist at your follow-up visit.  Note: Portions of this text may have been transcribed using voice recognition software. Every effort was made to ensure accuracy; however, inadvertent computerized transcription errors may still be present.

## 2021-02-14 NOTE — Discharge Instructions (Addendum)
Pt advised to contact oncologist in the morning If pain is worse/cannot tolerate recommend being seen in the ED

## 2021-02-14 NOTE — ED Provider Notes (Addendum)
[facility]    CSN: [id number] Arrival date & time: 02/14/21  1202      History   Chief Complaint Chief Complaint  Patient presents with  . Chest Pain    HPI Monica Hancock is a 40 y.o. female.   Pt complains of LUQ pain that started three days ago.  She injury or trauma. Reports one episode of vomiting on Friday, no recurrence.  Nothing seems to make the sx better or worse. Reports normal bowel movements.  Denies fever, chills, urinary sx, flank pain, back pain.  H/o breast cancer, reports last tx earlier this year. Followed by oncology.      Past Medical History:  Diagnosis Date  . BRCA1 positive   . Cancer [facility])    Right Breast  . Family history of adverse reaction to anesthesia    PONV-children  . Family history of BRCA1 gene positive   . Family history of breast cancer   . H/O Bell's palsy 2000   Left sided  . HSV infection     Patient Active Problem List   Diagnosis Date Noted  . Postop check 04/22/2020  . Status post abdominal hysterectomy 04/14/2020  . S/P bilateral mastectomy 01/24/2020  . Acute pain of left shoulder 01/01/2020  . Chest wall pain 01/01/2020  . Elevated alkaline phosphatase level 11/14/2019  . Elevated glucose 11/14/2019  . Dermatitis 11/13/2019  . Diarrhea 11/13/2019  . Nausea 11/13/2019  . Loss of appetite 11/13/2019  . Malignant neoplasm of upper-outer quadrant of right female breast ([facility]) 08/07/2019  . Screening breast examination 08/01/2019  . Breast lump on right side at 10 o'clock position 08/01/2019  . Breast lump on left side at 3 o'clock position 08/01/2019  . 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 Genetic testing 06/07/2016  . Family history of breast cancer   . Family history of BRCA1 gene positive   . UTI (lower urinary tract infection) 04/21/2015    Past Surgical History:  Procedure Laterality Date  . BREAST BIOPSY Right   . IR IMAGING GUIDED PORT INSERTION   08/26/2019   Right  . MASTECTOMY MODIFIED RADICAL Right 01/24/2020   Procedure: RIGHT MASTECTOMY MODIFIED RADICAL;  Surgeon: Aviva Signs, MD;  Location: [facility];  Service: General;  Laterality: Right;  . SALPINGOOPHORECTOMY Bilateral 04/14/2020   Procedure: OPEN BILATERAL SALPINGO OOPHORECTOMY;  Surgeon: [name], MD;  Location: [facility];  Service: Gynecology;  Laterality: Bilateral;  . SIMPLE MASTECTOMY WITH AXILLARY SENTINEL NODE BIOPSY Left 01/24/2020   Procedure: LEFT SIMPLE MASTECTOMY;  Surgeon: Aviva Signs, MD;  Location: [facility];  Service: General;  Laterality: Left;  . SUPRACERVICAL ABDOMINAL HYSTERECTOMY N/A 04/14/2020   Procedure: HYSTERECTOMY SUPRACERVICAL ABDOMINAL;  Surgeon: [name], MD;  Location: [facility];  Service: Gynecology;  Laterality: N/A;  . TUBAL LIGATION      OB History    Gravida  5   Para  5   Term  4   Preterm  1   AB  0   Living  5     SAB  0   IAB  0   Ectopic  0   Multiple  0   Live Births  5            Home Medications    Prior to Admission medications   Medication Sig Start Date End Date Taking? Authorizing Provider  acetaminophen (TYLENOL) 325 MG tablet Take 650 mg by mouth every 6 (six) hours as needed.  Patient not taking: Reported on 02/03/2021    [provider]  anastrozole (ARIMIDEX) 1 MG tablet TAKE 1 TABLET(1 MG) BY MOUTH DAILY 01/18/21   Derek Jack, MD  diphenhydrAMINE (BENADRYL) 50 MG tablet Take 50 mg by mouth every 8 (eight) hours as needed for allergies.  Patient not taking: Reported on 02/03/2021    [provider]  ferrous sulfate (FERROUSUL) 325 (65 FE) MG tablet Take 1 tablet (325 mg total) by mouth daily with breakfast. 04/16/20   [name], MD  ibuprofen (ADVIL) 600 MG tablet Take 1 tablet (600 mg total) by mouth every 6 (six) hours as needed for fever or headache. Patient not taking: Reported on 02/03/2021 04/16/20   [name], MD  lidocaine-prilocaine (EMLA) cream  Apply 1 application topically See admin instructions. Apply small amount to port a cath and cover with plastic wrap 1 hour prior to chemotherapy 02/03/21   Derek Jack, MD  loratadine (CLARITIN) 10 MG tablet Take 10 mg by mouth daily as needed for allergies.     [provider]  magnesium oxide (MAGNESIUM-OXIDE) 400 (241.3 Mg) MG tablet TAKE 1 TABLET(400 MG) BY MOUTH TWICE DAILY 09/28/20   Derek Jack, MD  oxyCODONE (OXY IR/ROXICODONE) 5 MG immediate release tablet Take 1-2 tablets (5-10 mg total) by mouth every 4 (four) hours as needed for moderate pain. Patient not taking: Reported on 02/03/2021 04/16/20   [name], MD  pertuzumab in sodium chloride 0.9 % 250 mL Inject into the vein every 21 ( twenty-one) days. Patient not taking: Reported on 02/03/2021 08/27/19   [provider]  potassium chloride (KLOR-CON) 10 MEQ tablet TAKE 2 TABLETS(20 MEQ) BY MOUTH TWICE DAILY 09/07/20   Derek Jack, MD  prochlorperazine (COMPAZINE) 10 MG tablet Take 1 tablet (10 mg total) by mouth every 6 (six) hours as needed for nausea or vomiting. 11/19/19   Lockamy, Randi L, NP-C  trastuzumab-dkst 2 mg/kg in sodium chloride 0.9 % 250 mL Inject into the vein every 21 ( twenty-one) days. 08/27/19   [provider]  venlafaxine (EFFEXOR) 37.5 MG tablet Take 2 tablets (75 mg total) by mouth daily. 02/03/21   Derek Jack, MD    Family History Family History  Problem Relation Age of Onset  . Diabetes Mother   . Breast cancer Mother 35  . Cancer Mother   . Diabetes Maternal Grandmother   . Stomach cancer Maternal Grandfather   . Breast cancer Maternal Aunt        dx in her 108s  . Diabetes Maternal Aunt     Social History Social History   Tobacco Use  . Smoking status: Never Smoker  . Smokeless tobacco: Never Used  Vaping Use  . Vaping Use: Never used  Substance Use Topics  . Alcohol use: No  . Drug use: No     Allergies   Patient has no  known allergies.   Review of Systems Review of Systems  Constitutional: Negative for chills and fever.  HENT: Negative for ear pain and sore throat.   Eyes: Negative for pain and visual disturbance.  Respiratory: Negative for cough and shortness of breath.   Cardiovascular: Negative for chest pain and palpitations.  Gastrointestinal: Positive for abdominal pain (LUQ). Negative for constipation, diarrhea, nausea and vomiting.  Genitourinary: Negative for difficulty urinating, dysuria, flank pain, frequency and hematuria.  Musculoskeletal: Negative for arthralgias, back pain and myalgias.  Skin: Negative for color change and rash.  Neurological: Negative for seizures and  syncope.  All other systems reviewed and are negative.    Physical Exam Triage Vital Signs ED Triage Vitals  Enc Vitals Group     BP 02/14/21 1215 136/88     Pulse Rate 02/14/21 1215 67     Resp 02/14/21 1215 18     Temp 02/14/21 1215 98.6 F (37 C)     Temp Source 02/14/21 1215 Oral     SpO2 02/14/21 1215 97 %     Weight --      Height --      Head Circumference --      Peak Flow --      Pain Score 02/14/21 1214 10     Pain Loc --      Pain Edu? --      Excl. in [place]? --    No data found.  Updated Vital Signs BP 136/88 (BP Location: Left Arm)   Pulse 67   Temp 98.6 F (37 C) (Oral)   Resp 18   LMP 09/29/2019   SpO2 97%   Visual Acuity Right Eye Distance:   Left Eye Distance:   Bilateral Distance:    Right Eye Near:   Left Eye Near:    Bilateral Near:     Physical Exam Vitals and nursing note reviewed.  Constitutional:      General: She is not in acute distress.    Appearance: She is well-developed.  HENT:     Head: Normocephalic and atraumatic.  Eyes:     Conjunctiva/sclera: Conjunctivae normal.  Cardiovascular:     Rate and Rhythm: Normal rate and regular rhythm.     Heart sounds: No murmur heard.   Pulmonary:     Effort: Pulmonary effort is normal. No respiratory distress.      Breath sounds: Normal breath sounds.  Abdominal:     General: Bowel sounds are normal.     Palpations: Abdomen is soft.     Tenderness: There is abdominal tenderness in the left lower quadrant. There is no left CVA tenderness.  Musculoskeletal:     Cervical back: Neck supple.  Skin:    General: Skin is warm and dry.  Neurological:     Mental Status: She is alert.      UC Treatments / Results  Labs (all labs ordered are listed, but only abnormal results are displayed) Labs Reviewed - No data to display  EKG   Radiology No results found.  Procedures Procedures (including critical care time)  Medications Ordered in UC Medications - No data to display  Initial Impression / Assessment and Plan / UC Course  I have reviewed the triage vital signs and the nursing notes.  Pertinent labs & imaging results that were available during my care of the patient were reviewed by me and considered in my medical decision making (see chart for details).     Pt complains of LUQ pain. Vitals normal, no fever/chills, n/v/d, flank pain, urinary sx. Discussed with patient that I cannot order imaging of abdomen in UC setting. Advised to follow up with onocologist in the morning, pt reports she does not have a PCP. Recommend ED eval if pain becomes more severe or she develops worsening or new sx. Pt agreeable to plan.  Final Clinical Impressions(s) / UC Diagnoses   Final diagnoses:  LUQ pain     Discharge Instructions     Pt advised to contact oncologist in the morning If pain is worse/cannot tolerate recommend being seen in the ED  ED Prescriptions    None     PDMP not reviewed this encounter.   Polly, Barner, PA-C 02/14/21 1238    Konrad Felix, PA-C 02/14/21 1238

## 2021-02-14 NOTE — ED Triage Notes (Signed)
Pt to the Ed with Left rib pain x3 days with no known injury.   Hx breast CA-

## 2021-02-14 NOTE — ED Provider Notes (Addendum)
[facility] EMERGENCY DEPARTMENT Provider Note   CSN: [id number] Arrival date & time: 02/14/21  1241     History Chief Complaint  Patient presents with  . Abdominal Pain    Monica Hancock is a 40 y.o. female history of breast cancer, hysterectomy, bells palsy.  Presents for LUQ abd pain/left lower rib pain. Began three days ago while sitting in care. Sudden onset constant moderate sharp pain non radiating, no aggravating or alleviating factors. Initially pain associated with nbnb emesis. No emesis over last three days.  Patient reports she has been able to eat and drink without increased pain or recurrence of nausea/vomiting over the past 2 days.  Denies fv, chills, cp, sob, cough, hemoptysis, dysuria, hematuria, diarrhea, bloody stools, falls, injury, back pain, numbness, weakness, tingling, hx of blood clot, extremity swelling or any additional concerns.  HPI     Past Medical History:  Diagnosis Date  . BRCA1 positive   . Cancer [facility])    Right Breast  . Family history of adverse reaction to anesthesia    PONV-children  . Family history of BRCA1 gene positive   . Family history of breast cancer   . H/O Bell's palsy 2000   Left sided  . HSV infection     Patient Active Problem List   Diagnosis Date Noted  . Postop check 04/22/2020  . Status post abdominal hysterectomy 04/14/2020  . S/P bilateral mastectomy 01/24/2020  . Acute pain of left shoulder 01/01/2020  . Chest wall pain 01/01/2020  . Elevated alkaline phosphatase level 11/14/2019  . Elevated glucose 11/14/2019  . Dermatitis 11/13/2019  . Diarrhea 11/13/2019  . Nausea 11/13/2019  . Loss of appetite 11/13/2019  . Malignant neoplasm of upper-outer quadrant of right female breast ([place]) 08/07/2019  . Screening breast examination 08/01/2019  . Breast lump on right side at 10 o'clock position 08/01/2019  . Breast lump on left side at 3 o'clock position 08/01/2019  . 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 Genetic testing 06/07/2016  . Family history of breast cancer   . Family history of BRCA1 gene positive   . UTI (lower urinary tract infection) 04/21/2015    Past Surgical History:  Procedure Laterality Date  . BREAST BIOPSY Right   . IR IMAGING GUIDED PORT INSERTION  08/26/2019   Right  . MASTECTOMY MODIFIED RADICAL Right 01/24/2020   Procedure: RIGHT MASTECTOMY MODIFIED RADICAL;  Surgeon: Aviva Signs, MD;  Location: [facility];  Service: General;  Laterality: Right;  . SALPINGOOPHORECTOMY Bilateral 04/14/2020   Procedure: OPEN BILATERAL SALPINGO OOPHORECTOMY;  Surgeon: [name], MD;  Location: [facility];  Service: Gynecology;  Laterality: Bilateral;  . SIMPLE MASTECTOMY WITH AXILLARY SENTINEL NODE BIOPSY Left 01/24/2020   Procedure: LEFT SIMPLE MASTECTOMY;  Surgeon: Aviva Signs, MD;  Location: [facility];  Service: General;  Laterality: Left;  . SUPRACERVICAL ABDOMINAL HYSTERECTOMY N/A 04/14/2020   Procedure: HYSTERECTOMY SUPRACERVICAL ABDOMINAL;  Surgeon: [name], MD;  Location: [facility];  Service: Gynecology;  Laterality: N/A;  . TUBAL LIGATION       OB History    Gravida  5   Para  5   Term  4   Preterm  1   AB  0   Living  5     SAB  0   IAB  0   Ectopic  0   Multiple  0   Live Births  5           Family  History  Problem Relation Age of Onset  . Diabetes Mother   . Breast cancer Mother 86  . Cancer Mother   . Diabetes Maternal Grandmother   . Stomach cancer Maternal Grandfather   . Breast cancer Maternal Aunt        dx in her 66s  . Diabetes Maternal Aunt     Social History   Tobacco Use  . Smoking status: Never Smoker  . Smokeless tobacco: Never Used  Vaping Use  . Vaping Use: Never used  Substance Use Topics  . Alcohol use: No  . Drug use: No    Home Medications Prior to Admission medications   Medication Sig Start Date End Date Taking? Authorizing Provider  acetaminophen (TYLENOL) 325 MG  tablet Take 650 mg by mouth every 6 (six) hours as needed.   Yes [provider]  diphenhydrAMINE (BENADRYL) 50 MG tablet Take 50 mg by mouth every 8 (eight) hours as needed for allergies.   Yes [provider]  lidocaine (LIDODERM) 5 % Place 1 patch onto the skin daily. Remove & Discard patch within 12 hours or as directed by MD 02/14/21  Yes Nuala Alpha A, PA-C  lidocaine-prilocaine (EMLA) cream Apply 1 application topically See admin instructions. Apply small amount to port a cath and cover with plastic wrap 1 hour prior to chemotherapy 02/03/21  Yes Derek Jack, MD  loratadine (CLARITIN) 10 MG tablet Take 10 mg by mouth daily as needed for allergies.    Yes [provider]  magnesium oxide (MAGNESIUM-OXIDE) 400 (241.3 Mg) MG tablet TAKE 1 TABLET(400 MG) BY MOUTH TWICE DAILY 09/28/20  Yes Derek Jack, MD  pantoprazole (PROTONIX) 20 MG tablet Take 1 tablet (20 mg total) by mouth daily. 02/14/21  Yes Nuala Alpha A, PA-C  potassium chloride (KLOR-CON) 10 MEQ tablet TAKE 2 TABLETS(20 MEQ) BY MOUTH TWICE DAILY 09/07/20  Yes Derek Jack, MD  venlafaxine (EFFEXOR) 37.5 MG tablet Take 2 tablets (75 mg total) by mouth daily. 02/03/21  Yes Derek Jack, MD  anastrozole (ARIMIDEX) 1 MG tablet TAKE 1 TABLET(1 MG) BY MOUTH DAILY Patient not taking: Reported on 02/14/2021 01/18/21   Derek Jack, MD  ferrous sulfate (FERROUSUL) 325 (65 FE) MG tablet Take 1 tablet (325 mg total) by mouth daily with breakfast. Patient not taking: Reported on 02/14/2021 04/16/20   [name], MD  ibuprofen (ADVIL) 600 MG tablet Take 1 tablet (600 mg total) by mouth every 6 (six) hours as needed for fever or headache. Patient not taking: No sig reported 04/16/20   [name], MD  oxyCODONE (OXY IR/ROXICODONE) 5 MG immediate release tablet Take 1-2 tablets (5-10 mg total) by mouth every 4 (four) hours as needed for moderate pain. Patient not taking: No  sig reported 04/16/20   [name], MD  pertuzumab in sodium chloride 0.9 % 250 mL Inject into the vein every 21 ( twenty-one) days. Patient not taking: No sig reported 08/27/19   [provider]  prochlorperazine (COMPAZINE) 10 MG tablet Take 1 tablet (10 mg total) by mouth every 6 (six) hours as needed for nausea or vomiting. Patient not taking: Reported on 02/14/2021 11/19/19   Francene Finders L, NP-C  trastuzumab-dkst 2 mg/kg in sodium chloride 0.9 % 250 mL Inject into the vein every 21 ( twenty-one) days. Patient not taking: Reported on 02/14/2021 08/27/19   [provider]    Allergies    Patient has no known allergies.  Review of Systems   Review  of Systems Ten systems are reviewed and are negative for acute change except as noted in the HPI   Physical Exam Updated Vital Signs BP 124/89 (BP Location: Left Arm)   Pulse 72   Temp 97.9 F (36.6 C) (Oral)   Resp 17   Ht _0  (1.422 m)   Wt 74.8 kg   LMP 09/29/2019   SpO2 100%   BMI 36.99 kg/m   Physical Exam Constitutional:      General: She is not in acute distress.    Appearance: Normal appearance. She is well-developed. She is not ill-appearing or diaphoretic.  HENT:     Head: Normocephalic and atraumatic.  Eyes:     General: Vision grossly intact. Gaze aligned appropriately.     Pupils: Pupils are equal, round, and reactive to light.  Neck:     Trachea: Trachea and phonation normal.  Cardiovascular:     Rate and Rhythm: Normal rate and regular rhythm.  Pulmonary:     Effort: Pulmonary effort is normal. No respiratory distress.  Chest:     Chest wall: Tenderness present. No deformity or crepitus.  Abdominal:     General: There is no distension.     Palpations: Abdomen is soft.     Tenderness: There is abdominal tenderness in the left upper quadrant. There is no guarding or rebound. Negative signs include Murphy's sign and McBurney's sign.    Musculoskeletal:        General: Normal range  of motion.     Cervical back: Normal range of motion.  Skin:    General: Skin is warm and dry.     Findings: No rash.  Neurological:     Mental Status: She is alert.     GCS: GCS eye subscore is 4. GCS verbal subscore is 5. GCS motor subscore is 6.     Comments: Speech is clear and goal oriented, follows commands Major Cranial nerves without deficit, no facial droop Moves extremities without ataxia, coordination intact  Psychiatric:        Behavior: Behavior normal.     ED Results / Procedures / Treatments   Labs (all labs ordered are listed, but only abnormal results are displayed) Labs Reviewed  COMPREHENSIVE METABOLIC PANEL - Abnormal; Notable for the following components:      Result Value   Glucose, Bld 121 (*)    All other components within normal limits  URINALYSIS, ROUTINE W REFLEX MICROSCOPIC - Abnormal; Notable for the following components:   Color, Urine STRAW (*)    All other components within normal limits  CBC WITH DIFFERENTIAL/PLATELET  LIPASE, BLOOD  TROPONIN I (HIGH SENSITIVITY)    EKG EKG Interpretation  Date/Time:  Sunday February 14 2021 13:25:49 EDT Ventricular Rate:  59 PR Interval:    QRS Duration: 146 QT Interval:  430 QTC Calculation: 426 R Axis:   41 Text Interpretation: Sinus rhythm Right bundle branch block New since previous tracing Confirmed by Fredia Sorrow [phone]) on 02/14/2021 1:58:55 PM   Radiology CT Angio Chest PE W and/or Wo Contrast  Result Date: 02/14/2021 CLINICAL DATA:  Rule out pulmonary embolus. Patient reports left-sided rib pain. Left upper quadrant pain. Rule out abdominal abscess or infection. History of breast cancer. EXAM: CT ANGIOGRAPHY CHEST CT ABDOMEN AND PELVIS WITH CONTRAST TECHNIQUE: Multidetector CT imaging of the chest was performed using the standard protocol during bolus administration of intravenous contrast. Multiplanar CT image reconstructions and MIPs were obtained to evaluate the vascular anatomy.  Multidetector CT  imaging of the abdomen and pelvis was performed using the standard protocol during bolus administration of intravenous contrast. CONTRAST:  158m OMNIPAQUE IOHEXOL 350 MG/ML SOLN COMPARISON:  PET-CT 08/15/2019. FINDINGS: CTA CHEST FINDINGS Cardiovascular: Satisfactory opacification of the pulmonary arteries to the segmental level. No evidence of pulmonary embolism. Normal heart size. No pericardial effusion. Mediastinum/Nodes: No enlarged mediastinal, hilar, or axillary lymph nodes. Thyroid gland, trachea, and esophagus demonstrate no significant findings. Lungs/Pleura: No pleural effusion. No airspace consolidation, atelectasis or pneumothorax. No interstitial edema. No suspicious lung nodules or mass. Musculoskeletal: Bilateral mastectomy. Right axillary node dissection. No acute or suspicious osseous abnormalities. Review of the MIP images confirms the above findings. CT ABDOMEN and PELVIS FINDINGS Hepatobiliary: No focal liver abnormality is seen. No gallstones, gallbladder wall thickening, or biliary dilatation. Pancreas: Unremarkable. No pancreatic ductal dilatation or surrounding inflammatory changes. Spleen: Normal in size without focal abnormality. Adrenals/Urinary Tract: Adrenal glands are unremarkable. Kidneys are normal, without renal calculi, focal lesion, or hydronephrosis. Bladder is unremarkable. Stomach/Bowel: Stomach appears normal. The appendix is visualized and appears normal. No bowel wall thickening, inflammation or distension. Vascular/Lymphatic: No significant vascular findings are present. No enlarged abdominal or pelvic lymph nodes. Reproductive: Status post hysterectomy. No adnexal masses. Other: No free fluid or fluid collection within the abdomen or pelvis. Musculoskeletal: No acute or significant osseous findings. Review of the MIP images confirms the above findings. IMPRESSION: 1. No evidence for acute pulmonary embolus. 2. No acute findings within the abdomen or  pelvis. 3. Bilateral mastectomy and right axillary node dissection. Electronically Signed   By: TKerby MoorsM.D.   On: 02/14/2021 15:13   CT ABDOMEN PELVIS W CONTRAST  Result Date: 02/14/2021 CLINICAL DATA:  Rule out pulmonary embolus. Patient reports left-sided rib pain. Left upper quadrant pain. Rule out abdominal abscess or infection. History of breast cancer. EXAM: CT ANGIOGRAPHY CHEST CT ABDOMEN AND PELVIS WITH CONTRAST TECHNIQUE: Multidetector CT imaging of the chest was performed using the standard protocol during bolus administration of intravenous contrast. Multiplanar CT image reconstructions and MIPs were obtained to evaluate the vascular anatomy. Multidetector CT imaging of the abdomen and pelvis was performed using the standard protocol during bolus administration of intravenous contrast. CONTRAST:  1054mOMNIPAQUE IOHEXOL 350 MG/ML SOLN COMPARISON:  PET-CT 08/15/2019. FINDINGS: CTA CHEST FINDINGS Cardiovascular: Satisfactory opacification of the pulmonary arteries to the segmental level. No evidence of pulmonary embolism. Normal heart size. No pericardial effusion. Mediastinum/Nodes: No enlarged mediastinal, hilar, or axillary lymph nodes. Thyroid gland, trachea, and esophagus demonstrate no significant findings. Lungs/Pleura: No pleural effusion. No airspace consolidation, atelectasis or pneumothorax. No interstitial edema. No suspicious lung nodules or mass. Musculoskeletal: Bilateral mastectomy. Right axillary node dissection. No acute or suspicious osseous abnormalities. Review of the MIP images confirms the above findings. CT ABDOMEN and PELVIS FINDINGS Hepatobiliary: No focal liver abnormality is seen. No gallstones, gallbladder wall thickening, or biliary dilatation. Pancreas: Unremarkable. No pancreatic ductal dilatation or surrounding inflammatory changes. Spleen: Normal in size without focal abnormality. Adrenals/Urinary Tract: Adrenal glands are unremarkable. Kidneys are normal,  without renal calculi, focal lesion, or hydronephrosis. Bladder is unremarkable. Stomach/Bowel: Stomach appears normal. The appendix is visualized and appears normal. No bowel wall thickening, inflammation or distension. Vascular/Lymphatic: No significant vascular findings are present. No enlarged abdominal or pelvic lymph nodes. Reproductive: Status post hysterectomy. No adnexal masses. Other: No free fluid or fluid collection within the abdomen or pelvis. Musculoskeletal: No acute or significant osseous findings. Review of the MIP images confirms the above findings. IMPRESSION: 1.  No evidence for acute pulmonary embolus. 2. No acute findings within the abdomen or pelvis. 3. Bilateral mastectomy and right axillary node dissection. Electronically Signed   By: Kerby Moors M.D.   On: 02/14/2021 15:13   DG Abdomen Acute W/Chest  Result Date: 02/14/2021 CLINICAL DATA:  LEFT upper quadrant pain since Friday EXAM: DG ABDOMEN ACUTE WITH 1 VIEW CHEST COMPARISON:  Chest radiographs 08/05/2016 FINDINGS: RIGHT jugular Port-A-Cath with tip projecting over SVC. Normal heart size, mediastinal contours, and pulmonary vascularity. Lungs clear. No infiltrate, pleural effusion, or pneumothorax. Post BILATERAL mastectomy with surgical clips RIGHT chest wall/axilla. Normal bowel gas pattern. No bowel dilatation, bowel wall thickening, or free air. Bones unremarkable. No urinary tract calcification. IMPRESSION: No acute abnormalities. Electronically Signed   By: Lavonia Dana M.D.   On: 02/14/2021 14:05    Procedures Procedures   Medications Ordered in ED Medications  sodium chloride 0.9 % bolus 1,000 mL (1,000 mLs Intravenous Not Given 02/14/21 1416)  alum & mag hydroxide-simeth (MAALOX/MYLANTA) 200-200-20 MG/5ML suspension 30 mL (30 mLs Oral Given 02/14/21 1350)    And  lidocaine (XYLOCAINE) 2 % viscous mouth solution 15 mL (15 mLs Oral Given 02/14/21 1350)  pantoprazole (PROTONIX) injection 40 mg (40 mg Intravenous  Given 02/14/21 1352)  iohexol (OMNIPAQUE) 350 MG/ML injection 100 mL (100 mLs Intravenous Contrast Given 02/14/21 1444)    ED Course  I have reviewed the triage vital signs and the nursing notes.  Pertinent labs & imaging results that were available during my care of the patient were reviewed by me and considered in my medical decision making (see chart for details).    MDM Rules/Calculators/A&P                         Additional history obtained from: 1. Nursing notes from this visit. 2. Review of EMR. Patient at [facility] earlier today and referred to ER for RUQ abd pain. ----------------- 40 year old female presents for 3 days of left upper quadrant versus left lower rib pain constant sharp associated with some vomiting 3 days ago which has not reoccurred.  Will obtain abdominal pain labs, additionally will obtain EKG and troponin to rule out atypical presentation for ACS.  Case was discussed with Dr. Rogene Houston, will obtain CT angio chest PE study to evaluate for PE given patient with known history of cancer. ------ I ordered, reviewed and interpreted labs which include: CBC within normal limits, no leukocytosis to suggest infectious process.  No anemia or thrombocytopenia. CMP shows no emergent electrolyte derangement, AKI, LFT ideations or gap. Urinalysis within normal limits, no evidence for UTI and no hemoglobin to suggest kidney stone disease. Lipase within normal limits, doubt pancreatitis. Patient with history of hysterectomy, doubt pregnancy. High-sensitivity troponin negative, the setting of multiple days of constant symptoms no indication for delta troponin.  Doubt ACS.  EKG: Sinus rhythm Right bundle branch block New since previous tracing Confirmed by Fredia Sorrow [phone]) on 02/14/2021 1:58:55 PM  DG Chest/ABD:  IMPRESSION:  No acute abnormalities.   CT Angio Chest/ CT [facility]:    IMPRESSION:  1. No evidence for acute pulmonary embolus.  2. No acute findings within the abdomen  or pelvis.  3. Bilateral mastectomy and right axillary node dissection.  - No emergent etiology of patient's symptoms found today.  Possibility of GERD versus musculoskeletal pain.  Patient does not take any PPI currently will start on Protonix and encourage close PCP follow-up.  Incidentally patient noted to have  new right bundle branch block, she will be referred to cardiology for further evaluation.  There is no indication for admission, antibiotics or further work-up at this time.  On reassessment patient resting comfortably bed no acute distress vital signs stable.  At this time there does not appear to be any evidence of an acute emergency medical condition and the patient appears stable for discharge with appropriate outpatient follow up. Diagnosis was discussed with patient who verbalizes understanding of care plan and is agreeable to discharge. I have discussed return precautions with patient who verbalizes understanding. Patient encouraged to follow-up with their PCP, oncologist and Cardiology. All questions answered.  Patient's case discussed with Dr. Rogene Houston who agrees with plan to discharge with follow-up.   Note: Portions of this report may have been transcribed using voice recognition software. Every effort was made to ensure accuracy; however, inadvertent computerized transcription errors may still be present. Final Clinical Impression(s) / ED Diagnoses Final diagnoses:  Epigastric pain  Rib pain on left side  Right bundle branch block    Rx / DC Orders ED Discharge Orders         Ordered    pantoprazole (PROTONIX) 20 MG tablet  Daily        02/14/21 1539    lidocaine (LIDODERM) 5 %  Every 24 hours        02/14/21 1539           Gari Crown 02/14/21 1541    Deliah Boston, PA-C 02/14/21 1543    Fredia Sorrow, MD 02/14/21 1550

## 2021-02-14 NOTE — ED Triage Notes (Signed)
continuous sharp pain to LUQ since this Friday.  Denies any injury.  Reports she started a new medication on Friday.

## 2021-02-15 ENCOUNTER — Telehealth (HOSPITAL_COMMUNITY): Payer: Self-pay | Admitting: Surgery

## 2021-02-15 NOTE — Telephone Encounter (Signed)
Pt had left a voicemail stating that she had to go to the ER yesterday due to left sided abdominal pain that started when she began taking Effexor.  She also wanted to let him know that she was referred to a cardiologist due to finding a right bundle branch block.  Per Dr. Delton Coombes, the pt can stop the Effexor and see if the pain goes away.  If the pain does improve, then he can start the pt on a different medication to help with her hot flashes.  Also, Dr. Delton Coombes was made aware of the bundle branch block  I called the pt back and she was agreeable with the plan to stop the Effexor, and she was told to call our office back to let us know if the pain had improved.

## 2021-02-17 ENCOUNTER — Other Ambulatory Visit: Payer: Self-pay

## 2021-02-17 ENCOUNTER — Emergency Department (HOSPITAL_COMMUNITY)
Admission: EM | Admit: 2021-02-17 | Discharge: 2021-02-17 | Disposition: A | Discharge status: Home / Self Care | Payer: Medicaid Other | Attending: Emergency Medicine | Admitting: Emergency Medicine

## 2021-02-17 ENCOUNTER — Encounter (HOSPITAL_COMMUNITY): Payer: Self-pay

## 2021-02-17 DIAGNOSIS — R059 Cough, unspecified: Secondary | ICD-10-CM | POA: Diagnosis not present

## 2021-02-17 DIAGNOSIS — Z853 Personal history of malignant neoplasm of breast: Secondary | ICD-10-CM | POA: Insufficient documentation

## 2021-02-17 DIAGNOSIS — R1012 Left upper quadrant pain: Secondary | ICD-10-CM

## 2021-02-17 DIAGNOSIS — R109 Unspecified abdominal pain: Secondary | ICD-10-CM | POA: Diagnosis present

## 2021-02-17 MED ORDER — TRAMADOL HCL 50 MG PO TABS: 50.0000 mg | ORAL_TABLET | Freq: Four times a day (QID) | ORAL | 0 refills | Status: DC | PRN

## 2021-02-17 NOTE — Discharge Instructions (Signed)
The testing at the other day does not show any serious problems.  Continue taking the medication that they prescribed.  Otherwise follow your doctor's instructions.  Follow-up with your primary care doctor for checkup if your pain is not better in a couple of days.  Make sure you are getting plenty of rest and try to eat 3 meals a day.  Do not drive when taking the narcotic pain reliever.

## 2021-02-17 NOTE — ED Provider Notes (Signed)
[facility] EMERGENCY DEPARTMENT Provider Note   CSN: [id number] Arrival date & time: 02/17/21  5374     History Chief Complaint  Patient presents with  . Flank Pain    Monica Hancock is a 40 y.o. female.  HPI She presents for evaluation of productive cough abdominal pain, persistent, felt his "sharp,", for 6 days.  It started within 2 hours of taking her first dose of venlafaxine.  It was started for "hot flashes."  She took 3 doses and has stopped.  She was in the ED and had a comprehensive evaluation, 3 days ago.  After that evaluation she was started on a PPI.  She has not noticed any relief.  She is using Tylenol for pain, and it does not help.  She does not eat because she is worried that it will make her pain worse.  She feels like she has a dry mouth but denies dizziness, weakness, nausea or vomiting, diarrhea, cough or shortness of breath.  She is recovering from breast cancer, status post bilateral mastectomy, and chemotherapy.  She is not receiving active treatment for breast cancer at this time.  There are no other known active modifying factors.    Past Medical History:  Diagnosis Date  . BRCA1 positive   . Cancer [facility])    Right Breast  . Family history of adverse reaction to anesthesia    PONV-children  . Family history of BRCA1 gene positive   . Family history of breast cancer   . H/O Bell's palsy 2000   Left sided  . HSV infection     Patient Active Problem List   Diagnosis Date Noted  . Postop check 04/22/2020  . Status post abdominal hysterectomy 04/14/2020  . S/P bilateral mastectomy 01/24/2020  . Acute pain of left shoulder 01/01/2020  . Chest wall pain 01/01/2020  . Elevated alkaline phosphatase level 11/14/2019  . Elevated glucose 11/14/2019  . Dermatitis 11/13/2019  . Diarrhea 11/13/2019  . Nausea 11/13/2019  . Loss of appetite 11/13/2019  . Malignant neoplasm of upper-outer quadrant of right female breast ([place]) 08/07/2019  . Screening breast  examination 08/01/2019  . Breast lump on right side at 10 o'clock position 08/01/2019  . Breast lump on left side at 3 o'clock position 08/01/2019  . 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 Genetic testing 06/07/2016  . Family history of breast cancer   . Family history of BRCA1 gene positive   . UTI (lower urinary tract infection) 04/21/2015    Past Surgical History:  Procedure Laterality Date  . BREAST BIOPSY Right   . IR IMAGING GUIDED PORT INSERTION  08/26/2019   Right  . MASTECTOMY MODIFIED RADICAL Right 01/24/2020   Procedure: RIGHT MASTECTOMY MODIFIED RADICAL;  Surgeon: Aviva Signs, MD;  Location: [facility];  Service: General;  Laterality: Right;  . SALPINGOOPHORECTOMY Bilateral 04/14/2020   Procedure: OPEN BILATERAL SALPINGO OOPHORECTOMY;  Surgeon: [name], MD;  Location: [facility] ORS;  Service: Gynecology;  Laterality: Bilateral;  . SIMPLE MASTECTOMY WITH AXILLARY SENTINEL NODE BIOPSY Left 01/24/2020   Procedure: LEFT SIMPLE MASTECTOMY;  Surgeon: Aviva Signs, MD;  Location: [facility] ORS;  Service: General;  Laterality: Left;  . SUPRACERVICAL ABDOMINAL HYSTERECTOMY N/A 04/14/2020   Procedure: HYSTERECTOMY SUPRACERVICAL ABDOMINAL;  Surgeon: [name], MD;  Location: [facility] ORS;  Service: Gynecology;  Laterality: N/A;  . TUBAL LIGATION       OB History    Gravida  5   Para  5  Term  4   Preterm  1   AB  0   Living  5     SAB  0   IAB  0   Ectopic  0   Multiple  0   Live Births  5           Family History  Problem Relation Age of Onset  . Diabetes Mother   . Breast cancer Mother 25  . Cancer Mother   . Diabetes Maternal Grandmother   . Stomach cancer Maternal Grandfather   . Breast cancer Maternal Aunt        dx in her 44s  . Diabetes Maternal Aunt     Social History   Tobacco Use  . Smoking status: Never Smoker  . Smokeless tobacco: Never Used  Vaping Use  . Vaping Use: Never used  Substance Use Topics  .  Alcohol use: No  . Drug use: No    Home Medications Prior to Admission medications   Medication Sig Start Date End Date Taking? Authorizing Provider  acetaminophen (TYLENOL) 325 MG tablet Take 650 mg by mouth every 6 (six) hours as needed.   Yes [provider]  anastrozole (ARIMIDEX) 1 MG tablet TAKE 1 TABLET(1 MG) BY MOUTH DAILY 01/18/21  Yes Derek Jack, MD  diphenhydrAMINE (BENADRYL) 50 MG tablet Take 50 mg by mouth every 8 (eight) hours as needed for allergies.   Yes [provider]  ferrous sulfate (FERROUSUL) 325 (65 FE) MG tablet Take 1 tablet (325 mg total) by mouth daily with breakfast. 04/16/20  Yes [name], MD  ibuprofen (ADVIL) 600 MG tablet Take 1 tablet (600 mg total) by mouth every 6 (six) hours as needed for fever or headache. 04/16/20  Yes [name], MD  loratadine (CLARITIN) 10 MG tablet Take 10 mg by mouth daily as needed for allergies.    Yes [provider]  magnesium oxide (MAGNESIUM-OXIDE) 400 (241.3 Mg) MG tablet TAKE 1 TABLET(400 MG) BY MOUTH TWICE DAILY 09/28/20  Yes Derek Jack, MD  pantoprazole (PROTONIX) 20 MG tablet Take 1 tablet (20 mg total) by mouth daily. 02/14/21  Yes Nuala Alpha A, PA-C  potassium chloride (KLOR-CON) 10 MEQ tablet TAKE 2 TABLETS(20 MEQ) BY MOUTH TWICE DAILY 09/07/20  Yes Derek Jack, MD  traMADol (ULTRAM) 50 MG tablet Take 1 tablet (50 mg total) by mouth every 6 (six) hours as needed for moderate pain. Do not drive, or operate machinery when taking this medication. 02/17/21  Yes Daleen Bo, MD  lidocaine (LIDODERM) 5 % Place 1 patch onto the skin daily. Remove & Discard patch within 12 hours or as directed by MD Patient not taking: Reported on 02/17/2021 02/14/21   Nuala Alpha A, PA-C  lidocaine-prilocaine (EMLA) cream Apply 1 application topically See admin instructions. Apply small amount to port a cath and cover with plastic wrap 1 hour prior to chemotherapy Patient  not taking: Reported on 02/17/2021 02/03/21   Derek Jack, MD  oxyCODONE (OXY IR/ROXICODONE) 5 MG immediate release tablet Take 1-2 tablets (5-10 mg total) by mouth every 4 (four) hours as needed for moderate pain. Patient not taking: Reported on 02/17/2021 04/16/20   [name], MD  pertuzumab in sodium chloride 0.9 % 250 mL Inject into the vein every 21 ( twenty-one) days. Patient not taking: No sig reported 08/27/19   [provider]  prochlorperazine (COMPAZINE) 10 MG tablet Take 1 tablet (10 mg total) by mouth every 6 (six) hours as  needed for nausea or vomiting. Patient not taking: No sig reported 11/19/19   Lockamy, Randi L, NP-C  trastuzumab-dkst 2 mg/kg in sodium chloride 0.9 % 250 mL Inject into the vein every 21 ( twenty-one) days. Patient not taking: No sig reported 08/27/19   [provider]  venlafaxine (EFFEXOR) 37.5 MG tablet Take 2 tablets (75 mg total) by mouth daily. Patient not taking: Reported on 02/17/2021 02/03/21   Derek Jack, MD    Allergies    Patient has no known allergies.  Review of Systems   Review of Systems  All other systems reviewed and are negative.   Physical Exam Updated Vital Signs BP 96/72 (BP Location: Left Arm)   Pulse (!) 59   Temp 98.4 F (36.9 C) (Oral)   Resp 18   Ht 4' 8"  (1.422 m)   Wt 74.8 kg   LMP 09/29/2019   SpO2 100%   BMI 36.99 kg/m   Physical Exam Vitals and nursing note reviewed.  Constitutional:      General: She is not in acute distress.    Appearance: She is well-developed. She is obese. She is not ill-appearing, toxic-appearing or diaphoretic.  HENT:     Head: Normocephalic and atraumatic.     Right Ear: External ear normal.     Left Ear: External ear normal.  Eyes:     Conjunctiva/sclera: Conjunctivae normal.     Pupils: Pupils are equal, round, and reactive to light.  Neck:     Trachea: Phonation normal.  Cardiovascular:     Rate and Rhythm: Normal rate and regular rhythm.      Heart sounds: Normal heart sounds.  Pulmonary:     Effort: Pulmonary effort is normal.     Breath sounds: Normal breath sounds.  Abdominal:     General: There is no distension.     Palpations: Abdomen is soft. There is no mass.     Tenderness: There is abdominal tenderness (Right upper quadrant, mild).     Hernia: No hernia is present.  Musculoskeletal:        General: Normal range of motion.     Cervical back: Normal range of motion and neck supple.     Right lower leg: No edema.     Left lower leg: No edema.  Skin:    General: Skin is warm and dry.  Neurological:     Mental Status: She is alert and oriented to person, place, and time.     Cranial Nerves: No cranial nerve deficit.     Sensory: No sensory deficit.     Motor: No abnormal muscle tone.     Coordination: Coordination normal.  Psychiatric:        Mood and Affect: Mood normal.        Behavior: Behavior normal.        Thought Content: Thought content normal.        Judgment: Judgment normal.     Comments: No overt clinical signs of depression.     ED Results / Procedures / Treatments   Labs (all labs ordered are listed, but only abnormal results are displayed) Labs Reviewed - No data to display  EKG None  Radiology No results found.  Procedures Procedures   Medications Ordered in ED Medications - No data to display  ED Course  I have reviewed the triage vital signs and the nursing notes.  Pertinent labs & imaging results that were available during my care of the patient were reviewed  by me and considered in my medical decision making (see chart for details).    MDM Rules/Calculators/A&P                           Patient Vitals for the past 24 hrs:  BP Temp Temp src Pulse Resp SpO2 Height Weight  02/17/21 1003 96/72 -- -- (!) 59 18 100 % -- --  02/17/21 0828 117/78 98.4 F (36.9 C) Oral 64 18 99 % -- --  02/17/21 0827 -- -- -- -- -- -- 4' 8"  (1.422 m) 74.8 kg    10:08 AM Reevaluation  with update and discussion. After initial assessment and treatment, an updated evaluation reveals she is comfortable, and has no further complaints.  I discussed the findings with her and all questions were answered. Daleen Bo   Medical Decision Making:  This patient is presenting for evaluation of persistent sharp abdominal pain left upper quadrant, which does not require a range of treatment options, and is not a complaint that involves a high risk of morbidity and mortality. The differential diagnoses include depression, nonspecific abdominal pain, breast cancer complications. I decided to review old records, and in summary middle-aged female, recently started on Effexor for "hot flashes," presenting with persistent pain following comprehensive evaluation, which was done in the ED, 3 days ago.  I did not require additional historical information from anyone.     Critical Interventions-clinical evaluation, discussion with patient, review of EMR, discussion with patient  After These Interventions, the Patient was reevaluated and was found stable for discharge.  Patient with nonspecific pain, reassuring physical exam, vital signs, and recent diagnostic evaluation that is available in the EMR.  No indication for further ED evaluation or intervention at this time.  Patient will be given short-term course of tramadol, to help pain, and encouraged to follow-up with her PCP for further problems.  There is not appear to be any evidence for unstable hemodynamic condition, acute intra-abdominal or chest processes, or recurrence of breast cancer.  CRITICAL CARE-no Performed by: Daleen Bo  Nursing Notes Reviewed/ Care Coordinated Applicable Imaging Reviewed Interpretation of Laboratory Data incorporated into ED treatment  The patient appears reasonably screened and/or stabilized for discharge and I doubt any other medical condition or other [facility] requiring further screening, evaluation, or treatment  in the ED at this time prior to discharge.  Plan: Home Medications-continue current; Home Treatments-rest, fluid; return here if the recommended treatment, does not improve the symptoms; Recommended follow up-PCP follow-up for further evaluation treatment as needed.     Final Clinical Impression(s) / ED Diagnoses Final diagnoses:  Left upper quadrant abdominal pain    Rx / DC Orders ED Discharge Orders         Ordered    traMADol (ULTRAM) 50 MG tablet  Every 6 hours PRN        02/17/21 1013           Daleen Bo, MD 02/17/21 1014

## 2021-02-17 NOTE — ED Triage Notes (Signed)
Pt presents to ED with continued left side rib pain

## 2021-02-19 ENCOUNTER — Other Ambulatory Visit: Payer: Self-pay

## 2021-02-19 ENCOUNTER — Ambulatory Visit
Admission: EM | Admit: 2021-02-19 | Discharge: 2021-02-19 | Disposition: A | Discharge status: Home / Self Care | Payer: Medicaid Other | Attending: Emergency Medicine | Admitting: Emergency Medicine

## 2021-02-19 ENCOUNTER — Encounter: Payer: Self-pay | Admitting: Emergency Medicine

## 2021-02-19 DIAGNOSIS — R21 Rash and other nonspecific skin eruption: Secondary | ICD-10-CM | POA: Diagnosis not present

## 2021-02-19 DIAGNOSIS — B029 Zoster without complications: Secondary | ICD-10-CM

## 2021-02-19 MED ORDER — VALACYCLOVIR HCL 1 G PO TABS: 1000.0000 mg | ORAL_TABLET | Freq: Three times a day (TID) | ORAL | 0 refills | Status: AC

## 2021-02-19 MED ORDER — DEXAMETHASONE SODIUM PHOSPHATE 10 MG/ML IJ SOLN
10.0000 mg | Freq: Once | INTRAMUSCULAR | Status: AC
  Administered 2021-02-19: 10 mg via INTRAMUSCULAR

## 2021-02-19 MED ORDER — PREDNISONE 10 MG (21) PO TBPK: ORAL_TABLET | Freq: Every day | ORAL | 0 refills | Status: DC

## 2021-02-19 NOTE — ED Provider Notes (Signed)
[place]   [id number] 02/19/21 Arrival Time: 1627  CC: SKIN COMPLAINT  SUBJECTIVE:  Monica Hancock is a 40 y.o. female who presents with a rash to LT back and abdomen x 1 day.  Denies precipitating event or trauma.  Denies changes in soaps, detergents, close contacts with similar rash, known trigger or environmental trigger, allergy. Denies medications change or starting a new medication recently.  Localizes the rash to LT side.  Describes it as painful, itchy, red, and numbness/ tingling.  Has NOT tried OTC medications.  Symptoms are made worse to the touch.  Denies similar symptoms in the past.   Denies fever, chills, nausea, vomiting, swelling, discharge, SOB, chest pain, abdominal pain, changes in bowel or bladder function.    ROS: As per HPI.  All other pertinent ROS negative.     Past Medical History:  Diagnosis Date  . BRCA1 positive   . Cancer [facility])    Right Breast  . Family history of adverse reaction to anesthesia    PONV-children  . Family history of BRCA1 gene positive   . Family history of breast cancer   . H/O Bell's palsy 2000   Left sided  . HSV infection    Past Surgical History:  Procedure Laterality Date  . BREAST BIOPSY Right   . IR IMAGING GUIDED PORT INSERTION  08/26/2019   Right  . MASTECTOMY MODIFIED RADICAL Right 01/24/2020   Procedure: RIGHT MASTECTOMY MODIFIED RADICAL;  Surgeon: Aviva Signs, MD;  Location: [facility];  Service: General;  Laterality: Right;  . SALPINGOOPHORECTOMY Bilateral 04/14/2020   Procedure: OPEN BILATERAL SALPINGO OOPHORECTOMY;  Surgeon: [name], MD;  Location: [facility];  Service: Gynecology;  Laterality: Bilateral;  . SIMPLE MASTECTOMY WITH AXILLARY SENTINEL NODE BIOPSY Left 01/24/2020   Procedure: LEFT SIMPLE MASTECTOMY;  Surgeon: Aviva Signs, MD;  Location: [facility];  Service: General;  Laterality: Left;  . SUPRACERVICAL ABDOMINAL HYSTERECTOMY N/A 04/14/2020   Procedure: HYSTERECTOMY SUPRACERVICAL ABDOMINAL;   Surgeon: [name], MD;  Location: [facility];  Service: Gynecology;  Laterality: N/A;  . TUBAL LIGATION     No Known Allergies No current facility-administered medications on file prior to encounter.   Current Outpatient Medications on File Prior to Encounter  Medication Sig Dispense Refill  . acetaminophen (TYLENOL) 325 MG tablet Take 650 mg by mouth every 6 (six) hours as needed.    Marland Kitchen anastrozole (ARIMIDEX) 1 MG tablet TAKE 1 TABLET(1 MG) BY MOUTH DAILY 30 tablet 6  . diphenhydrAMINE (BENADRYL) 50 MG tablet Take 50 mg by mouth every 8 (eight) hours as needed for allergies.    . ferrous sulfate (FERROUSUL) 325 (65 FE) MG tablet Take 1 tablet (325 mg total) by mouth daily with breakfast. 90 tablet 1  . ibuprofen (ADVIL) 600 MG tablet Take 1 tablet (600 mg total) by mouth every 6 (six) hours as needed for fever or headache. 30 tablet 0  . lidocaine (LIDODERM) 5 % Place 1 patch onto the skin daily. Remove & Discard patch within 12 hours or as directed by MD (Patient not taking: Reported on 02/17/2021) 15 patch 0  . lidocaine-prilocaine (EMLA) cream Apply 1 application topically See admin instructions. Apply small amount to port a cath and cover with plastic wrap 1 hour prior to chemotherapy (Patient not taking: Reported on 02/17/2021) 30 g 1  . loratadine (CLARITIN) 10 MG tablet Take 10 mg by mouth daily as needed for allergies.     . magnesium oxide (MAGNESIUM-OXIDE) 400 (  241.3 Mg) MG tablet TAKE 1 TABLET(400 MG) BY MOUTH TWICE DAILY 60 tablet 2  . oxyCODONE (OXY IR/ROXICODONE) 5 MG immediate release tablet Take 1-2 tablets (5-10 mg total) by mouth every 4 (four) hours as needed for moderate pain. (Patient not taking: Reported on 02/17/2021) 30 tablet 0  . pantoprazole (PROTONIX) 20 MG tablet Take 1 tablet (20 mg total) by mouth daily. 30 tablet 0  . pertuzumab in sodium chloride 0.9 % 250 mL Inject into the vein every 21 ( twenty-one) days. (Patient not taking: No sig reported)    . potassium  chloride (KLOR-CON) 10 MEQ tablet TAKE 2 TABLETS(20 MEQ) BY MOUTH TWICE DAILY 120 tablet 3  . prochlorperazine (COMPAZINE) 10 MG tablet Take 1 tablet (10 mg total) by mouth every 6 (six) hours as needed for nausea or vomiting. (Patient not taking: No sig reported) 30 tablet 3  . traMADol (ULTRAM) 50 MG tablet Take 1 tablet (50 mg total) by mouth every 6 (six) hours as needed for moderate pain. Do not drive, or operate machinery when taking this medication. 15 tablet 0  . trastuzumab-dkst 2 mg/kg in sodium chloride 0.9 % 250 mL Inject into the vein every 21 ( twenty-one) days. (Patient not taking: No sig reported)    . venlafaxine (EFFEXOR) 37.5 MG tablet Take 2 tablets (75 mg total) by mouth daily. (Patient not taking: Reported on 02/17/2021) 60 tablet 5   Social History   Socioeconomic History  . Marital status: Married    Spouse name: [name]  . Number of children: 5  . Years of education: Not on file  . Highest education level: 12th grade  Occupational History  . Not on file  Tobacco Use  . Smoking status: Never Smoker  . Smokeless tobacco: Never Used  Vaping Use  . Vaping Use: Never used  Substance and Sexual Activity  . Alcohol use: No  . Drug use: No  . Sexual activity: Not Currently    Birth control/protection: Surgical    Comment: [facility]  Other Topics Concern  . Not on file  Social History Narrative  . Not on file   Social Determinants of Health   Financial Resource Strain: Low Risk   . Difficulty of Paying Living Expenses: Not hard at all  Food Insecurity: No Food Insecurity  . Worried About Charity fundraiser in the Last Year: Never true  . Ran Out of Food in the Last Year: Never true  Transportation Needs: No Transportation Needs  . Lack of Transportation (Medical): No  . Lack of Transportation (Non-Medical): No  Physical Activity: Inactive  . Days of Exercise per Week: 0 days  . Minutes of Exercise per Session: 0 min  Stress: No Stress Concern Present  . Feeling of  Stress : Not at all  Social Connections: Moderately Isolated  . Frequency of Communication with Friends and Family: More than three times a week  . Frequency of Social Gatherings with Friends and Family: Three times a week  . Attends Religious Services: Never  . Active Member of Clubs or Organizations: No  . Attends Archivist Meetings: Never  . Marital Status: Married  Human resources officer Violence: Not At Risk  . Fear of Current or Ex-Partner: No  . Emotionally Abused: No  . Physically Abused: No  . Sexually Abused: No   Family History  Problem Relation Age of Onset  . Diabetes Mother   . Breast cancer Mother 5  . Cancer Mother   . Diabetes  Maternal Grandmother   . Stomach cancer Maternal Grandfather   . Breast cancer Maternal Aunt        dx in her 69s  . Diabetes Maternal Aunt     OBJECTIVE: Vitals:   02/19/21 1655  BP: 108/77  Pulse: 88  Resp: 17  Temp: 98.6 F (37 C)  TempSrc: Oral  SpO2: 98%    General appearance: alert; no distress Head: NCAT Lungs: normal respiratory effort Extremities: no edema Skin: warm and dry; Crops of red/purplish papules over T8-9 dermatome on the LT back and side Psychological: alert and cooperative; normal mood and affect  ASSESSMENT & PLAN:  1. Rash and nonspecific skin eruption   2. Herpes zoster without complication     Meds ordered this encounter  Medications  . valACYclovir (VALTREX) 1000 MG tablet    Sig: Take 1 tablet (1,000 mg total) by mouth 3 (three) times daily for 10 days.    Dispense:  30 tablet    Refill:  0    Order Specific Question:   Supervising Provider    Answer:   Raylene Everts [[id number]]  . predniSONE (STERAPRED UNI-PAK 21 TAB) 10 MG (21) TBPK tablet    Sig: Take by mouth daily. Take 6 tabs by mouth daily  for 2 days, then 5 tabs for 2 days, then 4 tabs for 2 days, then 3 tabs for 2 days, 2 tabs for 2 days, then 1 tab by mouth daily for 2 days    Dispense:  42 tablet    Refill:  0    Order  Specific Question:   Supervising Provider    Answer:   Raylene Everts [[id number]]  . dexamethasone (DECADRON) injection 10 mg   Rest and use ice/heat as needed for symptomatic relief Prescribed valacyclovir 1026m 3x/day for 10 days Prescribed prednisone taper for inflammation and pain Use OTC medications such as ibuprofen/ tylenol.  Use tramadol as needed for break-through pain Follow up with PCP in 7-10 days if rash is still present Follow up with PCP if symptoms of burning, stinging, tingling or numbness occur after rash resolves, you may need additional treatment Return here or go to ER if you have any new or worsening symptoms (such as eye involvement, severe pain, or signs of secondary infection such as fever, chills, nausea, vomiting, discharge, redness or warmth over site of rash)  Reviewed expectations re: course of current medical issues. Questions answered. Outlined signs and symptoms indicating need for more acute intervention. Patient verbalized understanding. After Visit Summary given.   WLestine Box PA-C 02/19/21 1713

## 2021-02-19 NOTE — Discharge Instructions (Addendum)
Rest and use ice/heat as needed for symptomatic relief Prescribed valacyclovir 1000mg  3x/day for 10 days Prescribed prednisone taper for inflammation and pain Use OTC medications such as ibuprofen/ tylenol.  Use tramadol as needed for break-through pain Follow up with PCP in 7-10 days if rash is still present Follow up with PCP if symptoms of burning, stinging, tingling or numbness occur after rash resolves, you may need additional treatment Return here or go to ER if you have any new or worsening symptoms (such as eye involvement, severe pain, or signs of secondary infection such as fever, chills, nausea, vomiting, discharge, redness or warmth over site of rash)

## 2021-02-19 NOTE — ED Triage Notes (Signed)
Rash to LT side, noticed this morning

## 2021-02-23 NOTE — Progress Notes (Signed)
CARDIOLOGY CONSULT NOTE      Patient ID: Monica Hancock MRN: [id number] DOB/AGE: [birth year] 40 y.o.  Admit date: (Not on file) Referring Physician: Nuala Alpha PA [facility]  Primary Physician: Patient, No Pcp Per (Inactive) Primary Cardiologist: New Reason for Consultation: Abnormal ECG  Active Problems:   * No active hospital problems. *   HPI:  40 y.o. referred by Nuala Alpha PA [facility] for abnormal ECG ? New RBBB. Patient seen in ER multiple times between 3/27 and 4/1 for LUQ pain Near ribs with sharp pain. Also noted cough with abdominal pain Related to taking Venlafaxine for hot flashes No relief with PPI  Noted rash on back and abdomen subsequently diagnosed with Herpes Zoster Rx Valtrex decadron and prednisone I guess ECG done due to LUQ/rib pain noted RBBB that was new since 2014 CTA 02/14/21 with no PE no acute findings no acute abdominal/pelvic findings  She has breast cancer post bilateral mastectomy 01/24/20  with right axillary dissection Diagnosed September 2020 with chemoRx through 2/111/21 which included Herceptin Maintained on Arimidex  Echo done 08/03/20 with EF 65-70% no strain reported no valve disease or pericardial effusion   She has no cardiac symptoms busy at home with 4 kids ages 89-14. Some muscular positional Pain over left breast   ROS All other systems reviewed and negative except as noted above  Past Medical History:  Diagnosis Date  . BRCA1 positive   . Cancer [facility])    Right Breast  . Family history of adverse reaction to anesthesia    PONV-children  . Family history of BRCA1 gene positive   . Family history of breast cancer   . H/O Bell's palsy 2000   Left sided  . HSV infection     Family History  Problem Relation Age of Onset  . Diabetes Mother   . Breast cancer Mother 77  . Cancer Mother   . Diabetes Maternal Grandmother   . Stomach cancer Maternal Grandfather   . Breast cancer Maternal Aunt        dx in her 99s  . Diabetes  Maternal Aunt     Social History   Socioeconomic History  . Marital status: Married    Spouse name: [name]  . Number of children: 5  . Years of education: Not on file  . Highest education level: 12th grade  Occupational History  . Not on file  Tobacco Use  . Smoking status: Never Smoker  . Smokeless tobacco: Never Used  Vaping Use  . Vaping Use: Never used  Substance and Sexual Activity  . Alcohol use: No  . Drug use: No  . Sexual activity: Not Currently    Birth control/protection: Surgical    Comment: [facility]  Other Topics Concern  . Not on file  Social History Narrative  . Not on file   Social Determinants of Health   Financial Resource Strain: Low Risk   . Difficulty of Paying Living Expenses: Not hard at all  Food Insecurity: No Food Insecurity  . Worried About Charity fundraiser in the Last Year: Never true  . Ran Out of Food in the Last Year: Never true  Transportation Needs: No Transportation Needs  . Lack of Transportation (Medical): No  . Lack of Transportation (Non-Medical): No  Physical Activity: Inactive  . Days of Exercise per Week: 0 days  . Minutes of Exercise per Session: 0 min  Stress: No Stress Concern Present  . Feeling of Stress : Not  at all  Social Connections: Moderately Isolated  . Frequency of Communication with Friends and Family: More than three times a week  . Frequency of Social Gatherings with Friends and Family: Three times a week  . Attends Religious Services: Never  . Active Member of Clubs or Organizations: No  . Attends Archivist Meetings: Never  . Marital Status: Married  Human resources officer Violence: Not At Risk  . Fear of Current or Ex-Partner: No  . Emotionally Abused: No  . Physically Abused: No  . Sexually Abused: No    Past Surgical History:  Procedure Laterality Date  . BREAST BIOPSY Right   . IR IMAGING GUIDED PORT INSERTION  08/26/2019   Right  . MASTECTOMY MODIFIED RADICAL Right 01/24/2020   Procedure: RIGHT  MASTECTOMY MODIFIED RADICAL;  Surgeon: Aviva Signs, MD;  Location: [facility];  Service: General;  Laterality: Right;  . SALPINGOOPHORECTOMY Bilateral 04/14/2020   Procedure: OPEN BILATERAL SALPINGO OOPHORECTOMY;  Surgeon: [name], MD;  Location: [facility];  Service: Gynecology;  Laterality: Bilateral;  . SIMPLE MASTECTOMY WITH AXILLARY SENTINEL NODE BIOPSY Left 01/24/2020   Procedure: LEFT SIMPLE MASTECTOMY;  Surgeon: Aviva Signs, MD;  Location: [facility];  Service: General;  Laterality: Left;  . SUPRACERVICAL ABDOMINAL HYSTERECTOMY N/A 04/14/2020   Procedure: HYSTERECTOMY SUPRACERVICAL ABDOMINAL;  Surgeon: [name], MD;  Location: [facility];  Service: Gynecology;  Laterality: N/A;  . TUBAL LIGATION        Current Outpatient Medications:  .  acetaminophen (TYLENOL) 325 MG tablet, Take 650 mg by mouth every 6 (six) hours as needed., Disp: , Rfl:  .  anastrozole (ARIMIDEX) 1 MG tablet, TAKE 1 TABLET(1 MG) BY MOUTH DAILY, Disp: 30 tablet, Rfl: 6 .  diphenhydrAMINE (BENADRYL) 50 MG tablet, Take 50 mg by mouth every 8 (eight) hours as needed for allergies., Disp: , Rfl:  .  ferrous sulfate (FERROUSUL) 325 (65 FE) MG tablet, Take 1 tablet (325 mg total) by mouth daily with breakfast., Disp: 90 tablet, Rfl: 1 .  ibuprofen (ADVIL) 600 MG tablet, Take 1 tablet (600 mg total) by mouth every 6 (six) hours as needed for fever or headache., Disp: 30 tablet, Rfl: 0 .  lidocaine (LIDODERM) 5 %, Place 1 patch onto the skin daily. Remove & Discard patch within 12 hours or as directed by MD (Patient not taking: Reported on 02/17/2021), Disp: 15 patch, Rfl: 0 .  lidocaine-prilocaine (EMLA) cream, Apply 1 application topically See admin instructions. Apply small amount to port a cath and cover with plastic wrap 1 hour prior to chemotherapy (Patient not taking: Reported on 02/17/2021), Disp: 30 g, Rfl: 1 .  loratadine (CLARITIN) 10 MG tablet, Take 10 mg by mouth daily as needed for allergies. , Disp: , Rfl:  .   magnesium oxide (MAGNESIUM-OXIDE) 400 (241.3 Mg) MG tablet, TAKE 1 TABLET(400 MG) BY MOUTH TWICE DAILY, Disp: 60 tablet, Rfl: 2 .  oxyCODONE (OXY IR/ROXICODONE) 5 MG immediate release tablet, Take 1-2 tablets (5-10 mg total) by mouth every 4 (four) hours as needed for moderate pain. (Patient not taking: Reported on 02/17/2021), Disp: 30 tablet, Rfl: 0 .  pantoprazole (PROTONIX) 20 MG tablet, Take 1 tablet (20 mg total) by mouth daily., Disp: 30 tablet, Rfl: 0 .  pertuzumab in sodium chloride 0.9 % 250 mL, Inject into the vein every 21 ( twenty-one) days. (Patient not taking: No sig reported), Disp: , Rfl:  .  potassium chloride (KLOR-CON) 10 MEQ tablet, TAKE 2 TABLETS(20 MEQ) BY  MOUTH TWICE DAILY, Disp: 120 tablet, Rfl: 3 .  predniSONE (STERAPRED UNI-PAK 21 TAB) 10 MG (21) TBPK tablet, Take by mouth daily. Take 6 tabs by mouth daily  for 2 days, then 5 tabs for 2 days, then 4 tabs for 2 days, then 3 tabs for 2 days, 2 tabs for 2 days, then 1 tab by mouth daily for 2 days, Disp: 42 tablet, Rfl: 0 .  prochlorperazine (COMPAZINE) 10 MG tablet, Take 1 tablet (10 mg total) by mouth every 6 (six) hours as needed for nausea or vomiting. (Patient not taking: No sig reported), Disp: 30 tablet, Rfl: 3 .  traMADol (ULTRAM) 50 MG tablet, Take 1 tablet (50 mg total) by mouth every 6 (six) hours as needed for moderate pain. Do not drive, or operate machinery when taking this medication., Disp: 15 tablet, Rfl: 0 .  trastuzumab-dkst 2 mg/kg in sodium chloride 0.9 % 250 mL, Inject into the vein every 21 ( twenty-one) days. (Patient not taking: No sig reported), Disp: , Rfl:  .  valACYclovir (VALTREX) 1000 MG tablet, Take 1 tablet (1,000 mg total) by mouth 3 (three) times daily for 10 days., Disp: 30 tablet, Rfl: 0 .  venlafaxine (EFFEXOR) 37.5 MG tablet, Take 2 tablets (75 mg total) by mouth daily. (Patient not taking: Reported on 02/17/2021), Disp: 60 tablet, Rfl: 5    Physical Exam: Last menstrual period 09/29/2019.    Affect appropriate Healthy:  appears stated age 56: normal Neck supple with no adenopathy JVP normal no bruits no thyromegaly Lungs clear with no wheezing and good diaphragmatic motion Heart:  S1/S2 no murmur, no rub, gallop or click PMI normal Post bilateral mastectomy port a cath under right clavicle  Abdomen: benighn, BS positve, no tenderness, no AAA no bruit.  No HSM or HJR Distal pulses intact with no bruits No edema Neuro non-focal Skin warm and dry No muscular weakness   Labs:   Lab Results  Component Value Date   WBC 6.8 02/14/2021   HGB 13.7 02/14/2021   HCT 41.3 02/14/2021   MCV 91.0 02/14/2021   PLT 288 02/14/2021   No results for input(s): NA, K, CL, CO2, BUN, CREATININE, CALCIUM, PROT, BILITOT, ALKPHOS, ALT, AST, GLUCOSE in the last 168 hours.  Invalid input(s): LABALBU Lab Results  Component Value Date   TROPONINI <0.30 07/28/2013    Lab Results  Component Value Date   CHOL 183 09/17/2018   CHOL 193 09/11/2017   CHOL 169 11/16/2016   Lab Results  Component Value Date   HDL 52 09/17/2018   HDL 57 09/11/2017   HDL 41 (L) 11/16/2016   Lab Results  Component Value Date   LDLCALC 117 (H) 09/17/2018   LDLCALC 121 (H) 09/11/2017   LDLCALC 111 (H) 11/16/2016   Lab Results  Component Value Date   TRIG 70 09/17/2018   TRIG 77 09/11/2017   TRIG 86 11/16/2016   Lab Results  Component Value Date   CHOLHDL 3.5 09/17/2018   CHOLHDL 3.4 09/11/2017   CHOLHDL 4.1 11/16/2016   No results found for: LDLDIRECT    Radiology: CT Angio Chest PE W and/or Wo Contrast  Result Date: 02/14/2021 CLINICAL DATA:  Rule out pulmonary embolus. Patient reports left-sided rib pain. Left upper quadrant pain. Rule out abdominal abscess or infection. History of breast cancer. EXAM: CT ANGIOGRAPHY CHEST CT ABDOMEN AND PELVIS WITH CONTRAST TECHNIQUE: Multidetector CT imaging of the chest was performed using the standard protocol during bolus administration of  intravenous contrast.  Multiplanar CT image reconstructions and MIPs were obtained to evaluate the vascular anatomy. Multidetector CT imaging of the abdomen and pelvis was performed using the standard protocol during bolus administration of intravenous contrast. CONTRAST:  127m OMNIPAQUE IOHEXOL 350 MG/ML SOLN COMPARISON:  PET-CT 08/15/2019. FINDINGS: CTA CHEST FINDINGS Cardiovascular: Satisfactory opacification of the pulmonary arteries to the segmental level. No evidence of pulmonary embolism. Normal heart size. No pericardial effusion. Mediastinum/Nodes: No enlarged mediastinal, hilar, or axillary lymph nodes. Thyroid gland, trachea, and esophagus demonstrate no significant findings. Lungs/Pleura: No pleural effusion. No airspace consolidation, atelectasis or pneumothorax. No interstitial edema. No suspicious lung nodules or mass. Musculoskeletal: Bilateral mastectomy. Right axillary node dissection. No acute or suspicious osseous abnormalities. Review of the MIP images confirms the above findings. CT ABDOMEN and PELVIS FINDINGS Hepatobiliary: No focal liver abnormality is seen. No gallstones, gallbladder wall thickening, or biliary dilatation. Pancreas: Unremarkable. No pancreatic ductal dilatation or surrounding inflammatory changes. Spleen: Normal in size without focal abnormality. Adrenals/Urinary Tract: Adrenal glands are unremarkable. Kidneys are normal, without renal calculi, focal lesion, or hydronephrosis. Bladder is unremarkable. Stomach/Bowel: Stomach appears normal. The appendix is visualized and appears normal. No bowel wall thickening, inflammation or distension. Vascular/Lymphatic: No significant vascular findings are present. No enlarged abdominal or pelvic lymph nodes. Reproductive: Status post hysterectomy. No adnexal masses. Other: No free fluid or fluid collection within the abdomen or pelvis. Musculoskeletal: No acute or significant osseous findings. Review of the MIP images confirms the  above findings. IMPRESSION: 1. No evidence for acute pulmonary embolus. 2. No acute findings within the abdomen or pelvis. 3. Bilateral mastectomy and right axillary node dissection. Electronically Signed   By: TKerby MoorsM.D.   On: 02/14/2021 15:13   CT ABDOMEN PELVIS W CONTRAST  Result Date: 02/14/2021 CLINICAL DATA:  Rule out pulmonary embolus. Patient reports left-sided rib pain. Left upper quadrant pain. Rule out abdominal abscess or infection. History of breast cancer. EXAM: CT ANGIOGRAPHY CHEST CT ABDOMEN AND PELVIS WITH CONTRAST TECHNIQUE: Multidetector CT imaging of the chest was performed using the standard protocol during bolus administration of intravenous contrast. Multiplanar CT image reconstructions and MIPs were obtained to evaluate the vascular anatomy. Multidetector CT imaging of the abdomen and pelvis was performed using the standard protocol during bolus administration of intravenous contrast. CONTRAST:  1065mOMNIPAQUE IOHEXOL 350 MG/ML SOLN COMPARISON:  PET-CT 08/15/2019. FINDINGS: CTA CHEST FINDINGS Cardiovascular: Satisfactory opacification of the pulmonary arteries to the segmental level. No evidence of pulmonary embolism. Normal heart size. No pericardial effusion. Mediastinum/Nodes: No enlarged mediastinal, hilar, or axillary lymph nodes. Thyroid gland, trachea, and esophagus demonstrate no significant findings. Lungs/Pleura: No pleural effusion. No airspace consolidation, atelectasis or pneumothorax. No interstitial edema. No suspicious lung nodules or mass. Musculoskeletal: Bilateral mastectomy. Right axillary node dissection. No acute or suspicious osseous abnormalities. Review of the MIP images confirms the above findings. CT ABDOMEN and PELVIS FINDINGS Hepatobiliary: No focal liver abnormality is seen. No gallstones, gallbladder wall thickening, or biliary dilatation. Pancreas: Unremarkable. No pancreatic ductal dilatation or surrounding inflammatory changes. Spleen: Normal in  size without focal abnormality. Adrenals/Urinary Tract: Adrenal glands are unremarkable. Kidneys are normal, without renal calculi, focal lesion, or hydronephrosis. Bladder is unremarkable. Stomach/Bowel: Stomach appears normal. The appendix is visualized and appears normal. No bowel wall thickening, inflammation or distension. Vascular/Lymphatic: No significant vascular findings are present. No enlarged abdominal or pelvic lymph nodes. Reproductive: Status post hysterectomy. No adnexal masses. Other: No free fluid or fluid collection within the abdomen or pelvis. Musculoskeletal: No acute  or significant osseous findings. Review of the MIP images confirms the above findings. IMPRESSION: 1. No evidence for acute pulmonary embolus. 2. No acute findings within the abdomen or pelvis. 3. Bilateral mastectomy and right axillary node dissection. Electronically Signed   By: Kerby Moors M.D.   On: 02/14/2021 15:13   DG Abdomen Acute W/Chest  Result Date: 02/14/2021 CLINICAL DATA:  LEFT upper quadrant pain since Friday EXAM: DG ABDOMEN ACUTE WITH 1 VIEW CHEST COMPARISON:  Chest radiographs 08/05/2016 FINDINGS: RIGHT jugular Port-A-Cath with tip projecting over SVC. Normal heart size, mediastinal contours, and pulmonary vascularity. Lungs clear. No infiltrate, pleural effusion, or pneumothorax. Post BILATERAL mastectomy with surgical clips RIGHT chest wall/axilla. Normal bowel gas pattern. No bowel dilatation, bowel wall thickening, or free air. Bones unremarkable. No urinary tract calcification. IMPRESSION: No acute abnormalities. Electronically Signed   By: Lavonia Dana M.D.   On: 02/14/2021 14:05    EKG: 02/15/21 SR rate 59 RBBB new since 2014    ASSESSMENT AND PLAN:   1. RBBB:  New since 2014 but this is 8 years or so. Has had normal echos as part of her chemo surveillance most recently 08/03/20 CTA 02/14/21 no PE or signs of RV strain RVE effusion or other pathology  No need for further w/u   2. Breast  Cancer:  Post bilateral mastectomy f/u oncology ? Avoid effexor for hot flashes due to side effects Continue Arimidex  3. Herpes Zoster:  Diagnosed 02/19/21 likely related to her LUQ pains continue Valtrex f/u primary   F/U cardiology PRN Did discuss utility of coronary calcium score to risk stratify for CAD and she will consider doing latter in fall   Signed: Jenkins Rouge 02/23/2021, 7:53 PM

## 2021-02-24 ENCOUNTER — Other Ambulatory Visit (HOSPITAL_COMMUNITY): Payer: Self-pay | Admitting: Hematology

## 2021-02-25 ENCOUNTER — Encounter: Payer: Self-pay | Admitting: Internal Medicine

## 2021-02-25 ENCOUNTER — Ambulatory Visit (INDEPENDENT_AMBULATORY_CARE_PROVIDER_SITE_OTHER): Payer: Medicaid Other | Admitting: Internal Medicine

## 2021-02-25 ENCOUNTER — Other Ambulatory Visit: Payer: Self-pay

## 2021-02-25 VITALS — BP 130/87 | HR 75 | Temp 98.4°F | Resp 18 | Ht <= 58 in | Wt 165.4 lb

## 2021-02-25 DIAGNOSIS — Z1159 Encounter for screening for other viral diseases: Secondary | ICD-10-CM | POA: Diagnosis not present

## 2021-02-25 DIAGNOSIS — Z7689 Persons encountering health services in other specified circumstances: Secondary | ICD-10-CM | POA: Insufficient documentation

## 2021-02-25 DIAGNOSIS — Z8481 Family history of carrier of genetic disease: Secondary | ICD-10-CM

## 2021-02-25 DIAGNOSIS — C50411 Malignant neoplasm of upper-outer quadrant of right female breast: Secondary | ICD-10-CM | POA: Diagnosis not present

## 2021-02-25 DIAGNOSIS — Z9013 Acquired absence of bilateral breasts and nipples: Secondary | ICD-10-CM | POA: Diagnosis not present

## 2021-02-25 DIAGNOSIS — I4510 Unspecified right bundle-branch block: Secondary | ICD-10-CM

## 2021-02-25 DIAGNOSIS — Z170 Estrogen receptor positive status [ER+]: Secondary | ICD-10-CM

## 2021-02-25 DIAGNOSIS — R232 Flushing: Secondary | ICD-10-CM

## 2021-02-25 DIAGNOSIS — B029 Zoster without complications: Secondary | ICD-10-CM | POA: Insufficient documentation

## 2021-02-25 NOTE — Assessment & Plan Note (Signed)
On Effexor 

## 2021-02-25 NOTE — Assessment & Plan Note (Signed)
S/p b/l mastectomy due to BRCA1 mutation Follows up with Dr Delton Coombes Received Trastuzumab On [place]

## 2021-02-25 NOTE — Assessment & Plan Note (Signed)
For breast ca in the setting of BRCA1 mutation

## 2021-02-25 NOTE — Assessment & Plan Note (Signed)
S/p b/l mastectomy and TAH-BSO

## 2021-02-25 NOTE — Assessment & Plan Note (Signed)
Last EKG in ER showed RBBB. Compared to previous EKG from 2014 - showed sinus rhythm. Patient is going to see Cardiologist in next week.

## 2021-02-25 NOTE — Assessment & Plan Note (Signed)
Care established History and medications reviewed with the patient 

## 2021-02-25 NOTE — Patient Instructions (Signed)
Continue to take medications as prescribed.  Please continue to follow up with Dr Delton Coombes as scheduled.  Please follow heart healthy diet and perform moderate exercise/walking at least 150 mins/week.

## 2021-02-25 NOTE — Assessment & Plan Note (Signed)
Left lower rib cage area On Valacyclovir and Prednisone

## 2021-02-25 NOTE — Progress Notes (Signed)
New Patient Office Visit  Subjective:  Patient ID: Monica Hancock, female    DOB: 05-Feb-1981  Age: 40 y.o. MRN: [id number]  CC:  Chief Complaint  Patient presents with  . New Patient (Initial Visit)    New patient was diagnosed with breast cancer at the age of 12 since then she has good days and bad days sometimes she just doesn't feel good     HPI Monica Hancock is a 40 year old female with PMH of breast ca., BRCA1 mutation s/p b/l mastectomy and TAH-SBO, RBBB, shingles and obesity who  presents for establishing care.  She follows up with Dr Delton Coombes for h/o breast ca. She has received Herceptin and is on Anastrazole currently.  She had been having hot flashes, for which she recently started taking Effexor.  She had shingles recently, and is on Valacyclovir and Prednisone currently. Her rash and pain have been improving.  She has been having funny sensation intermittently in the chest area. EKG in the ER recently showed new-onset RBBB compared to old EKG in 2014. She gets routine Echo surveillance as she had been receiving Herceptin. Echo has been unremarkable. She is going to see Cardiologist in the next week.  She has not had COVID vaccine.    Past Medical History:  Diagnosis Date  . BRCA1 positive   . Cancer [facility])    Right Breast  . Family history of adverse reaction to anesthesia    PONV-children  . Family history of BRCA1 gene positive   . Family history of breast cancer   . H/O Bell's palsy 2000   Left sided  . HSV infection     Past Surgical History:  Procedure Laterality Date  . ABDOMINAL HYSTERECTOMY N/A    Phreesia 02/25/2021  . BREAST BIOPSY Right   . BREAST SURGERY N/A    [employer]reesia 02/25/2021  . IR IMAGING GUIDED PORT INSERTION  08/26/2019   Right  . MASTECTOMY MODIFIED RADICAL Right 01/24/2020   Procedure: RIGHT MASTECTOMY MODIFIED RADICAL;  Surgeon: Aviva Signs, MD;  Location: [facility];  Service: General;  Laterality: Right;  .  SALPINGOOPHORECTOMY Bilateral 04/14/2020   Procedure: OPEN BILATERAL SALPINGO OOPHORECTOMY;  Surgeon: [name], MD;  Location: [facility];  Service: Gynecology;  Laterality: Bilateral;  . SIMPLE MASTECTOMY WITH AXILLARY SENTINEL NODE BIOPSY Left 01/24/2020   Procedure: LEFT SIMPLE MASTECTOMY;  Surgeon: Aviva Signs, MD;  Location: [facility];  Service: General;  Laterality: Left;  . SUPRACERVICAL ABDOMINAL HYSTERECTOMY N/A 04/14/2020   Procedure: HYSTERECTOMY SUPRACERVICAL ABDOMINAL;  Surgeon: [name], MD;  Location: [facility];  Service: Gynecology;  Laterality: N/A;  . TUBAL LIGATION      Family History  Problem Relation Age of Onset  . Diabetes Mother   . Breast cancer Mother 77  . Cancer Mother   . Diabetes Maternal Grandmother   . Stomach cancer Maternal Grandfather   . Breast cancer Maternal Aunt        dx in her 43s  . Diabetes Maternal Aunt     Social History   Socioeconomic History  . Marital status: Married    Spouse name: [name]  . Number of children: 5  . Years of education: Not on file  . Highest education level: 12th grade  Occupational History  . Not on file  Tobacco Use  . Smoking status: Never Smoker  . Smokeless tobacco: Never Used  Vaping Use  . Vaping Use: Never used  Substance and Sexual Activity  . Alcohol  use: No  . Drug use: No  . Sexual activity: Not Currently    Birth control/protection: Surgical    Comment: [facility]  Other Topics Concern  . Not on file  Social History Narrative  . Not on file   Social Determinants of Health   Financial Resource Strain: Low Risk   . Difficulty of Paying Living Expenses: Not hard at all  Food Insecurity: No Food Insecurity  . Worried About Charity fundraiser in the Last Year: Never true  . Ran Out of Food in the Last Year: Never true  Transportation Needs: No Transportation Needs  . Lack of Transportation (Medical): No  . Lack of Transportation (Non-Medical): No  Physical Activity: Inactive  . Days of  Exercise per Week: 0 days  . Minutes of Exercise per Session: 0 min  Stress: No Stress Concern Present  . Feeling of Stress : Not at all  Social Connections: Moderately Isolated  . Frequency of Communication with Friends and Family: More than three times a week  . Frequency of Social Gatherings with Friends and Family: Three times a week  . Attends Religious Services: Never  . Active Member of Clubs or Organizations: No  . Attends Archivist Meetings: Never  . Marital Status: Married  Human resources officer Violence: Not At Risk  . Fear of Current or Ex-Partner: No  . Emotionally Abused: No  . Physically Abused: No  . Sexually Abused: No    ROS Review of Systems  Constitutional: Negative for chills and fever.  HENT: Negative for congestion, sinus pressure, sinus pain and sore throat.   Eyes: Negative for pain and discharge.  Respiratory: Negative for cough and shortness of breath.   Cardiovascular: Negative for chest pain and palpitations.  Gastrointestinal: Negative for abdominal pain, constipation, diarrhea, nausea and vomiting.  Endocrine: Negative for polydipsia and polyuria.  Genitourinary: Negative for dysuria and hematuria.  Musculoskeletal: Negative for neck pain and neck stiffness.  Skin: Positive for rash.  Neurological: Negative for dizziness and weakness.  Psychiatric/Behavioral: Negative for agitation and behavioral problems.    Objective:   Today's Vitals: BP 130/87 (BP Location: Right Arm, Patient Position: Sitting, Cuff Size: Normal)   Pulse 75   Temp 98.4 F (36.9 C) (Oral)   Resp 18   Ht _0  (1.448 m)   Wt 165 lb 6.4 oz (75 kg)   LMP 09/29/2019   SpO2 98%   BMI 35.79 kg/m   Physical Exam Vitals reviewed.  Constitutional:      General: She is not in acute distress.    Appearance: She is not diaphoretic.  HENT:     Head: Normocephalic and atraumatic.     Nose: Nose normal.     Mouth/Throat:     Mouth: Mucous membranes are moist.  Eyes:      General: No scleral icterus.    Extraocular Movements: Extraocular movements intact.     Pupils: Pupils are equal, round, and reactive to light.  Cardiovascular:     Rate and Rhythm: Normal rate and regular rhythm.     Pulses: Normal pulses.     Heart sounds: Normal heart sounds. No murmur heard.   Pulmonary:     Breath sounds: Normal breath sounds. No wheezing or rales.  Abdominal:     Palpations: Abdomen is soft.     Tenderness: There is no abdominal tenderness.  Musculoskeletal:     Cervical back: Neck supple. No tenderness.     Right lower leg: No  edema.     Left lower leg: No edema.  Skin:    General: Skin is warm.     Findings: Rash (Vesicular in left lower rib cage area laterally) present.  Neurological:     General: No focal deficit present.     Mental Status: She is alert and oriented to person, place, and time.     Sensory: No sensory deficit.     Motor: No weakness.  Psychiatric:        Mood and Affect: Mood normal.        Behavior: Behavior normal.     Assessment & Plan:   Problem List Items Addressed This Visit      Encounter to establish care - Primary   Care established History and medications reviewed with the patient      Cardiovascular and Mediastinum   Hot flashes    On Effexor      RBBB    Last EKG in ER showed RBBB. Compared to previous EKG from 2014 - showed sinus rhythm. Patient is going to see Cardiologist in next week.        Other   Family history of BRCA1 gene positive    S/p b/l mastectomy and TAH-BSO      Malignant neoplasm of upper-outer quadrant of right female breast ([place])    S/p b/l mastectomy due to BRCA1 mutation Follows up with Dr Delton Coombes Received Trastuzumab On Anastrazole      S/P bilateral mastectomy    For breast ca in the setting of BRCA1 mutation            Shingles    Left lower rib cage area On Valacyclovir and Prednisone       Other Visit Diagnoses    Need for hepatitis C screening test        Relevant Orders   Hepatitis C Antibody      Outpatient Encounter Medications as of 02/25/2021  Medication Sig  . anastrozole (ARIMIDEX) 1 MG tablet TAKE 1 TABLET(1 MG) BY MOUTH DAILY  . diphenhydrAMINE (BENADRYL) 50 MG tablet Take 50 mg by mouth every 8 (eight) hours as needed for allergies.  Marland Kitchen ibuprofen (ADVIL) 600 MG tablet Take 1 tablet (600 mg total) by mouth every 6 (six) hours as needed for fever or headache.  . lidocaine-prilocaine (EMLA) cream Apply 1 application topically See admin instructions. Apply small amount to port a cath and cover with plastic wrap 1 hour prior to chemotherapy  . loratadine (CLARITIN) 10 MG tablet Take 10 mg by mouth daily as needed for allergies.   Marland Kitchen MAGNESIUM-OXIDE 400 (241.3 Mg) MG tablet TAKE 1 TABLET(400 MG) BY MOUTH TWICE DAILY  . potassium chloride (KLOR-CON) 10 MEQ tablet TAKE 2 TABLETS(20 MEQ) BY MOUTH TWICE DAILY  . predniSONE (STERAPRED UNI-PAK 21 TAB) 10 MG (21) TBPK tablet Take by mouth daily. Take 6 tabs by mouth daily  for 2 days, then 5 tabs for 2 days, then 4 tabs for 2 days, then 3 tabs for 2 days, 2 tabs for 2 days, then 1 tab by mouth daily for 2 days  . trastuzumab-dkst 2 mg/kg in sodium chloride 0.9 % 250 mL Inject into the vein every 21 ( twenty-one) days.  . valACYclovir (VALTREX) 1000 MG tablet Take 1 tablet (1,000 mg total) by mouth 3 (three) times daily for 10 days.  Marland Kitchen venlafaxine (EFFEXOR) 37.5 MG tablet Take 2 tablets (75 mg total) by mouth daily.  . [DISCONTINUED] acetaminophen (TYLENOL) 325 MG tablet Take  650 mg by mouth every 6 (six) hours as needed. (Patient not taking: Reported on 02/25/2021)  . [DISCONTINUED] ferrous sulfate (FERROUSUL) 325 (65 FE) MG tablet Take 1 tablet (325 mg total) by mouth daily with breakfast. (Patient not taking: Reported on 02/25/2021)  . [DISCONTINUED] lidocaine (LIDODERM) 5 % Place 1 patch onto the skin daily. Remove & Discard patch within 12 hours or as directed by MD (Patient not taking: No sig  reported)  . [DISCONTINUED] oxyCODONE (OXY IR/ROXICODONE) 5 MG immediate release tablet Take 1-2 tablets (5-10 mg total) by mouth every 4 (four) hours as needed for moderate pain. (Patient not taking: No sig reported)  . [DISCONTINUED] pantoprazole (PROTONIX) 20 MG tablet Take 1 tablet (20 mg total) by mouth daily. (Patient not taking: Reported on 02/25/2021)  . [DISCONTINUED] pertuzumab in sodium chloride 0.9 % 250 mL Inject into the vein every 21 ( twenty-one) days. (Patient not taking: No sig reported)  . [DISCONTINUED] prochlorperazine (COMPAZINE) 10 MG tablet Take 1 tablet (10 mg total) by mouth every 6 (six) hours as needed for nausea or vomiting. (Patient not taking: No sig reported)  . [DISCONTINUED] traMADol (ULTRAM) 50 MG tablet Take 1 tablet (50 mg total) by mouth every 6 (six) hours as needed for moderate pain. Do not drive, or operate machinery when taking this medication. (Patient not taking: Reported on 02/25/2021)   No facility-administered encounter medications on file as of 02/25/2021.    Follow-up: Return in about 6 months (around 08/27/2021) for DM screening.   Lindell Spar, MD

## 2021-03-03 ENCOUNTER — Ambulatory Visit (INDEPENDENT_AMBULATORY_CARE_PROVIDER_SITE_OTHER): Payer: Medicaid Other | Admitting: Cardiovascular Disease

## 2021-03-03 ENCOUNTER — Other Ambulatory Visit: Payer: Self-pay

## 2021-03-03 ENCOUNTER — Encounter: Payer: Self-pay | Admitting: Cardiovascular Disease

## 2021-03-03 VITALS — BP 114/78 | HR 92 | Ht <= 58 in | Wt 164.0 lb

## 2021-03-03 DIAGNOSIS — I4510 Unspecified right bundle-branch block: Secondary | ICD-10-CM

## 2021-03-03 NOTE — Patient Instructions (Signed)
Medication Instructions:  Your physician recommends that you continue on your current medications as directed. Please refer to the Current Medication list given to you today.  *If you need a refill on your cardiac medications before your next appointment, please call your pharmacy*   Lab Work: None If you have labs (blood work) drawn today and your tests are completely normal, you will receive your results only by: Marland Kitchen MyChart Message (if you have MyChart) OR . A paper copy in the mail If you have any lab test that is abnormal or we need to change your treatment, we will call you to review the results.   Testing/Procedures: None   Follow-Up: At [facility], you and your health needs are our priority.  As part of our continuing mission to provide you with exceptional heart care, we have created designated Provider Care Teams.  These Care Teams include your primary Cardiologist (physician) and Advanced Practice Providers (APPs -  Physician Assistants and Nurse Practitioners) who all work together to provide you with the care you need, when you need it.  We recommend signing up for the patient portal called "MyChart".  Sign up information is provided on this After Visit Summary.  MyChart is used to connect with patients for Virtual Visits (Telemedicine).  Patients are able to view lab/test results, encounter notes, upcoming appointments, etc.  Non-urgent messages can be sent to your provider as well.   To learn more about what you can do with MyChart, go to [email or web address].    Your next appointment:   As Needed   Other Instructions None

## 2021-03-08 ENCOUNTER — Ambulatory Visit: Payer: Medicaid Other | Admitting: Internal Medicine

## 2021-03-17 ENCOUNTER — Ambulatory Visit: Payer: Medicaid Other | Admitting: Internal Medicine

## 2021-05-13 ENCOUNTER — Other Ambulatory Visit: Payer: Self-pay

## 2021-05-19 ENCOUNTER — Other Ambulatory Visit: Payer: Self-pay

## 2021-05-19 ENCOUNTER — Inpatient Hospital Stay (HOSPITAL_COMMUNITY): Payer: Medicaid Other | Attending: Hematology and Oncology

## 2021-05-19 ENCOUNTER — Encounter (HOSPITAL_COMMUNITY): Payer: Self-pay

## 2021-05-19 DIAGNOSIS — E876 Hypokalemia: Secondary | ICD-10-CM | POA: Insufficient documentation

## 2021-05-19 DIAGNOSIS — Z170 Estrogen receptor positive status [ER+]: Secondary | ICD-10-CM

## 2021-05-19 DIAGNOSIS — Z79899 Other long term (current) drug therapy: Secondary | ICD-10-CM | POA: Insufficient documentation

## 2021-05-19 DIAGNOSIS — Z9013 Acquired absence of bilateral breasts and nipples: Secondary | ICD-10-CM | POA: Insufficient documentation

## 2021-05-19 DIAGNOSIS — C50411 Malignant neoplasm of upper-outer quadrant of right female breast: Secondary | ICD-10-CM | POA: Insufficient documentation

## 2021-05-19 DIAGNOSIS — Z79811 Long term (current) use of aromatase inhibitors: Secondary | ICD-10-CM | POA: Insufficient documentation

## 2021-05-19 LAB — CBC WITH DIFFERENTIAL/PLATELET
Abs Immature Granulocytes: 0.02 10*3/uL (ref 0.00–0.07)
Basophils Absolute: 0 10*3/uL (ref 0.0–0.1)
Basophils Relative: 0 %
Eosinophils Absolute: 0.1 10*3/uL (ref 0.0–0.5)
Eosinophils Relative: 1 %
HCT: 41.3 % (ref 36.0–46.0)
Hemoglobin: 13.9 g/dL (ref 12.0–15.0)
Immature Granulocytes: 0 %
Lymphocytes Relative: 39 %
Lymphs Abs: 3.3 10*3/uL (ref 0.7–4.0)
MCH: 30.4 pg (ref 26.0–34.0)
MCHC: 33.7 g/dL (ref 30.0–36.0)
MCV: 90.4 fL (ref 80.0–100.0)
Monocytes Absolute: 0.6 10*3/uL (ref 0.1–1.0)
Monocytes Relative: 7 %
Neutro Abs: 4.4 10*3/uL (ref 1.7–7.7)
Neutrophils Relative %: 53 %
Platelets: 309 10*3/uL (ref 150–400)
RBC: 4.57 MIL/uL (ref 3.87–5.11)
RDW: 12.2 % (ref 11.5–15.5)
WBC: 8.4 10*3/uL (ref 4.0–10.5)
nRBC: 0 % (ref 0.0–0.2)

## 2021-05-19 LAB — COMPREHENSIVE METABOLIC PANEL
ALT: 36 U/L (ref 0–44)
AST: 24 U/L (ref 15–41)
Albumin: 4.2 g/dL (ref 3.5–5.0)
Alkaline Phosphatase: 93 U/L (ref 38–126)
Anion gap: 7 (ref 5–15)
BUN: 12 mg/dL (ref 6–20)
CO2: 26 mmol/L (ref 22–32)
Calcium: 9.9 mg/dL (ref 8.9–10.3)
Chloride: 105 mmol/L (ref 98–111)
Creatinine, Ser: 0.56 mg/dL (ref 0.44–1.00)
GFR, Estimated: >60 mL/min (ref >60)
Glucose, Bld: 82 mg/dL (ref 70–99)
Potassium: 3.6 mmol/L (ref 3.5–5.1)
Sodium: 138 mmol/L (ref 135–145)
Total Bilirubin: 0.6 mg/dL (ref 0.3–1.2)
Total Protein: 7.8 g/dL (ref 6.5–8.1)

## 2021-05-19 MED ORDER — SODIUM CHLORIDE 0.9% FLUSH: 10.0000 mL | INTRAVENOUS | Status: DC | PRN

## 2021-05-19 MED ORDER — HEPARIN SOD (PORK) LOCK FLUSH 100 UNIT/ML IV SOLN: 500.0000 [IU] | Freq: Once | INTRAVENOUS | Status: DC

## 2021-05-19 NOTE — Progress Notes (Signed)
Monica Hancock presented for Portacath access and flush.  Portacath located right chest wall accessed with  H 20 needle.  Good blood return present. Portacath flushed with 38ml NS and 500U/35ml Heparin and needle removed intact.  Procedure tolerated well and without incident.   Pt stable during and after procedure.  Vital signs stable. AVS reviewed.  Discharged in stable condition ambulatory.

## 2021-05-19 NOTE — Patient Instructions (Signed)
[facility]  Discharge Instructions: Thank you for choosing [facility] to provide your oncology and hematology care.  If you have a lab appointment with the [facility], please come in thru the Main Entrance and check in at the main information desk.  Wear comfortable clothing and clothing appropriate for easy access to any Portacath or PICC line.   We strive to give you quality time with your provider. You may need to reschedule your appointment if you arrive late (15 or more minutes).  Arriving late affects you and other patients whose appointments are after yours.  Also, if you miss three or more appointments without notifying the office, you may be dismissed from the clinic at the provider's discretion.      For prescription refill requests, have your pharmacy contact our office and allow 72 hours for refills to be completed.    Today you received the following chemotherapy and/or immunotherapy agents port flush with labs      To help prevent nausea and vomiting after your treatment, we encourage you to take your nausea medication as directed.  BELOW ARE SYMPTOMS THAT SHOULD BE REPORTED IMMEDIATELY: *FEVER GREATER THAN 100.4 F (38 C) OR HIGHER *CHILLS OR SWEATING *NAUSEA AND VOMITING THAT IS NOT CONTROLLED WITH YOUR NAUSEA MEDICATION *UNUSUAL SHORTNESS OF BREATH *UNUSUAL BRUISING OR BLEEDING *URINARY PROBLEMS (pain or burning when urinating, or frequent urination) *BOWEL PROBLEMS (unusual diarrhea, constipation, pain near the anus) TENDERNESS IN MOUTH AND THROAT WITH OR WITHOUT PRESENCE OF ULCERS (sore throat, sores in mouth, or a toothache) UNUSUAL RASH, SWELLING OR PAIN  UNUSUAL VAGINAL DISCHARGE OR ITCHING   Items with * indicate a potential emergency and should be followed up as soon as possible or go to the Emergency Department if any problems should occur.  Please show the CHEMOTHERAPY ALERT CARD or IMMUNOTHERAPY ALERT CARD at check-in to the  Emergency Department and triage nurse.  Should you have questions after your visit or need to cancel or reschedule your appointment, please contact [facility] [phone]  and follow the prompts.  Office hours are 8:00 a.m. to 4:30 p.m. Monday - Friday. Please note that voicemails left after 4:00 p.m. may not be returned until the following business day.  We are closed weekends and major holidays. You have access to a nurse at all times for urgent questions. Please call the main number to the clinic [phone] and follow the prompts.  For any non-urgent questions, you may also contact your provider using MyChart. We now offer e-Visits for anyone 67 and older to request care online for non-urgent symptoms. For details visit [email or web address].   Also download the MyChart app! Go to the app store, search "MyChart", open the app, select , and log in with your MyChart username and password.  Due to Covid, a mask is required upon entering the hospital/clinic. If you do not have a mask, one will be given to you upon arrival. For doctor visits, patients may have 1 support person aged 80 or older with them. For treatment visits, patients cannot have anyone with them due to current Covid guidelines and our immunocompromised population. [facility]  Discharge Instructions: Thank you for choosing [facility] to provide your oncology and hematology care.  If you have a lab appointment with the [facility], please come in thru the Main Entrance and check in at the main information desk.  Wear comfortable clothing and clothing appropriate for easy  access to any Portacath or PICC line.   We strive to give you quality time with your provider. You may need to reschedule your appointment if you arrive late (15 or more minutes).  Arriving late affects you and other patients whose appointments are after yours.  Also, if you miss three or more appointments  without notifying the office, you may be dismissed from the clinic at the provider's discretion.      For prescription refill requests, have your pharmacy contact our office and allow 72 hours for refills to be completed.    Today you received the following chemotherapy and/or immunotherapy agents port flush with labs      To help prevent nausea and vomiting after your treatment, we encourage you to take your nausea medication as directed.  BELOW ARE SYMPTOMS THAT SHOULD BE REPORTED IMMEDIATELY: *FEVER GREATER THAN 100.4 F (38 C) OR HIGHER *CHILLS OR SWEATING *NAUSEA AND VOMITING THAT IS NOT CONTROLLED WITH YOUR NAUSEA MEDICATION *UNUSUAL SHORTNESS OF BREATH *UNUSUAL BRUISING OR BLEEDING *URINARY PROBLEMS (pain or burning when urinating, or frequent urination) *BOWEL PROBLEMS (unusual diarrhea, constipation, pain near the anus) TENDERNESS IN MOUTH AND THROAT WITH OR WITHOUT PRESENCE OF ULCERS (sore throat, sores in mouth, or a toothache) UNUSUAL RASH, SWELLING OR PAIN  UNUSUAL VAGINAL DISCHARGE OR ITCHING   Items with * indicate a potential emergency and should be followed up as soon as possible or go to the Emergency Department if any problems should occur.  Please show the CHEMOTHERAPY ALERT CARD or IMMUNOTHERAPY ALERT CARD at check-in to the Emergency Department and triage nurse.  Should you have questions after your visit or need to cancel or reschedule your appointment, please contact [facility] [phone]  and follow the prompts.  Office hours are 8:00 a.m. to 4:30 p.m. Monday - Friday. Please note that voicemails left after 4:00 p.m. may not be returned until the following business day.  We are closed weekends and major holidays. You have access to a nurse at all times for urgent questions. Please call the main number to the clinic [phone] and follow the prompts.  For any non-urgent questions, you may also contact your provider using MyChart. We now offer  e-Visits for anyone 42 and older to request care online for non-urgent symptoms. For details visit [email or web address].   Also download the MyChart app! Go to the app store, search "MyChart", open the app, select [facility], and log in with your MyChart username and password.  Due to Covid, a mask is required upon entering the hospital/clinic. If you do not have a mask, one will be given to you upon arrival. For doctor visits, patients may have 1 support person aged 24 or older with them. For treatment visits, patients cannot have anyone with them due to current Covid guidelines and our immunocompromised population.

## 2021-05-20 LAB — VITAMIN D 25 HYDROXY (VIT D DEFICIENCY, FRACTURES): Vit D, 25-Hydroxy: 39.99 ng/mL (ref 30–100)

## 2021-05-20 LAB — CANCER ANTIGEN 15-3: CA 15-3: 18.5 U/mL (ref 0.0–25.0)

## 2021-05-26 ENCOUNTER — Inpatient Hospital Stay (HOSPITAL_COMMUNITY): Payer: Medicaid Other | Admitting: Hematology and Oncology

## 2021-06-24 ENCOUNTER — Other Ambulatory Visit: Payer: Self-pay

## 2021-06-24 ENCOUNTER — Inpatient Hospital Stay (HOSPITAL_COMMUNITY): Payer: Medicaid Other | Attending: Hematology and Oncology | Admitting: Nurse Practitioner

## 2021-06-24 VITALS — BP 113/86 | HR 78 | Temp 96.8°F | Resp 18 | Wt 166.9 lb

## 2021-06-24 DIAGNOSIS — Z170 Estrogen receptor positive status [ER+]: Secondary | ICD-10-CM | POA: Diagnosis not present

## 2021-06-24 DIAGNOSIS — E8941 Symptomatic postprocedural ovarian failure: Secondary | ICD-10-CM

## 2021-06-24 DIAGNOSIS — C50411 Malignant neoplasm of upper-outer quadrant of right female breast: Secondary | ICD-10-CM | POA: Diagnosis not present

## 2021-06-24 DIAGNOSIS — Z79811 Long term (current) use of aromatase inhibitors: Secondary | ICD-10-CM | POA: Diagnosis not present

## 2021-06-24 NOTE — Progress Notes (Signed)
[facility] [address], [place] [address]   Patient Care Team: Lindell Spar, MD as PCP - General (Internal Medicine)  Virtual Visit Progress Note  I connected with KAMEELA LEIPOLD on 06/24/21 at  1:40 PM EDT by video enabled telemedicine visit and verified that I am speaking with the correct person using two identifiers.   I discussed the limitations, risks, security and privacy concerns of performing an evaluation and management service by telemedicine and the availability of in-person appointments. I also discussed with the patient that there may be a patient responsible charge related to this service. The patient expressed understanding and agreed to proceed.   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 [facility])   08/27/2019 - 12/12/2019 Chemotherapy   The patient had palonosetron (ALOXI) injection 0.25 mg, 0.25 mg, Intravenous,  Once, 6 of 6 cycles Administration: 0.25 mg (08/27/2019), 0.25 mg (09/17/2019), 0.25 mg (11/19/2019), 0.25 mg (12/10/2019), 0.25 mg (10/08/2019), 0.25 mg (10/29/2019) pegfilgrastim-jmdb (FULPHILA) injection 6 mg, 6 mg, Subcutaneous,  Once, 6 of 6 cycles Administration: 6 mg (08/29/2019), 6 mg (09/19/2019), 6 mg (11/21/2019), 6 mg (12/12/2019), 6 mg (10/10/2019), 6 mg (10/31/2019) trastuzumab (HERCEPTIN) 600 mg in sodium chloride 0.9 % 250 mL chemo infusion, 567 mg (100 % of original dose 8 mg/kg), Intravenous,  Once, 1 of 1 cycle Dose modification: 8 mg/kg (original dose 8 mg/kg, Cycle 1, Reason: Other (see comments), Comment: using up last of our  Herceptin at [facility]) Administration: 600 mg (08/27/2019) CARBOplatin (PARAPLATIN) 780 mg in sodium chloride 0.9 % 250 mL chemo infusion, 780 mg (100 % of original dose 784.8 mg), Intravenous,  Once, 6 of 6 cycles Dose modification:   (original dose 784.8 mg, Cycle 1),   (original dose 784.8 mg, Cycle 2),   (original dose 784.8 mg, Cycle 5), 784.8 mg (original dose 784.8 mg, Cycle 5, Reason: Other (see comments), Comment: scr 0.8 entered),   (original dose 784.8 mg, Cycle 6),   (original dose 784.8 mg, Cycle 3),   (original dose 784.8 mg, Cycle 4) Administration: 780 mg (08/27/2019), 780 mg (09/17/2019), 780 mg (11/19/2019), 780 mg (12/10/2019), 780 mg (10/08/2019), 780 mg (10/29/2019) DOCEtaxel (TAXOTERE) 130 mg in sodium chloride 0.9 % 250 mL chemo infusion, 75 mg/m2 = 130 mg, Intravenous,  Once, 6 of 6 cycles Administration: 130 mg (08/27/2019), 130 mg (09/17/2019), 130 mg (11/19/2019), 130 mg (12/10/2019), 130 mg (10/08/2019), 130 mg (10/29/2019) pertuzumab (PERJETA) 840 mg in sodium chloride 0.9 % 250 mL chemo infusion, 840 mg, Intravenous, Once, 6 of 6 cycles Administration: 840 mg (08/27/2019), 420 mg (09/17/2019), 420 mg (11/19/2019), 420 mg (12/10/2019), 420 mg (10/08/2019), 420 mg (10/29/2019) fosaprepitant (EMEND) 150 mg, dexamethasone (DECADRON) 12 mg in sodium chloride 0.9 % 145 mL IVPB, , Intravenous,  Once, 6 of 6 cycles Administration:  (08/27/2019),  (09/17/2019),  (11/19/2019),  (12/10/2019),  (10/08/2019),  (10/29/2019) trastuzumab-dkst (OGIVRI) 450 mg in sodium chloride 0.9 % 250 mL chemo infusion, 420 mg, Intravenous,  Once, 5 of 5 cycles Administration: 450 mg (09/17/2019), 450 mg (11/19/2019), 420 mg (12/10/2019), 450 mg (10/08/2019), 450 mg (10/29/2019)  for chemotherapy treatment.  01/02/2020 -  Chemotherapy   The patient had trastuzumab (HERCEPTIN) 525 mg in sodium chloride 0.9 % 250 mL chemo infusion, 8 mg/kg = 525 mg (100 % of original dose 8 mg/kg), Intravenous,  Once, 1 of 1 cycle Dose  modification: 8 mg/kg (original dose 8 mg/kg, Cycle 6, Reason: Provider Judgment, Comment: Reload per MD due to delay in 1 week from last tx) Administration: 525 mg (04/24/2020) pertuzumab (PERJETA) 420 mg in sodium chloride 0.9 % 250 mL chemo infusion, 420 mg (100 % of original dose 420 mg), Intravenous, Once, 12 of 12 cycles Dose modification: 420 mg (original dose 420 mg, Cycle 1, Reason: Provider Judgment) Administration: 420 mg (01/02/2020), 420 mg (01/23/2020), 420 mg (03/26/2020), 420 mg (04/24/2020), 420 mg (05/15/2020), 420 mg (06/05/2020), 420 mg (06/26/2020), 420 mg (07/20/2020), 420 mg (02/13/2020), 420 mg (03/05/2020), 420 mg (08/10/2020), 420 mg (10/06/2020) trastuzumab-dkst (OGIVRI) 450 mg in sodium chloride 0.9 % 250 mL chemo infusion, 420 mg (100 % of original dose 6 mg/kg), Intravenous,  Once, 12 of 12 cycles Dose modification: 6 mg/kg (original dose 6 mg/kg, Cycle 1, Reason: Provider Judgment), 8 mg/kg (original dose 6 mg/kg, Cycle 12, Reason: Provider Judgment, Comment: missed dose >7 days) Administration: 450 mg (01/02/2020), 420 mg (01/23/2020), 420 mg (03/26/2020), 420 mg (05/15/2020), 420 mg (06/05/2020), 420 mg (06/26/2020), 420 mg (07/20/2020), 420 mg (02/13/2020), 420 mg (03/05/2020), 420 mg (08/10/2020), 600 mg (10/06/2020)  for chemotherapy treatment.      CHIEF COMPLIANT: Follow-up for right breast cancer   INTERVAL HISTORY: Ms. ALASHIA BROWNFIELD is a 40 y.o. female, currently on anastrozole who returns to clinic for routine follow up for history of breast cancer and brca1 positivity. She feels well overall. Energy has not returned to pre-treatment levels. Continues to have hot flashes but medications were ineffective so she discontinued them. Denies any neurologic complaints. Denies recent fevers or illnesses. Denies any easy bleeding or bruising. No melena or hematochezia. Reports good appetite and denies weight loss. Denies chest pain. Denies any nausea, vomiting, constipation, or diarrhea. Denies  urinary complaints. Patient offers no further specific complaints today.   REVIEW OF SYSTEMS:   Review of Systems  Constitutional:  Positive for fatigue. Negative for appetite change and unexpected weight change.  HENT:   Negative for mouth sores, sore throat and trouble swallowing.   Respiratory:  Negative for chest tightness and shortness of breath.   Cardiovascular:  Negative for leg swelling.  Gastrointestinal:  Negative for abdominal pain, constipation, diarrhea, nausea and vomiting.  Endocrine: Positive for hot flashes.  Genitourinary:  Negative for bladder incontinence, dysuria, pelvic pain and vaginal bleeding.   Musculoskeletal:  Negative for flank pain and neck stiffness.  Skin:  Negative for itching, rash and wound.  Neurological:  Negative for dizziness, headaches, light-headedness and numbness.  Hematological:  Negative for adenopathy.  Psychiatric/Behavioral:  Negative for confusion, depression and sleep disturbance. The patient is not nervous/anxious.     ALLERGIES:   has No Known Allergies.   MEDICATIONS:  Current Outpatient Medications  Medication Sig Dispense Refill   anastrozole (ARIMIDEX) 1 MG tablet TAKE 1 TABLET(1 MG) BY MOUTH DAILY 30 tablet 6   diphenhydrAMINE (BENADRYL) 50 MG tablet Take 50 mg by mouth every 8 (eight) hours as needed for allergies.     ibuprofen (ADVIL) 600 MG tablet Take 1 tablet (600 mg total) by mouth every 6 (six) hours as needed for fever or headache. 30 tablet 0   lidocaine-prilocaine (EMLA) cream Apply 1 application topically See  admin instructions. Apply small amount to port a cath and cover with plastic wrap 1 hour prior to chemotherapy 30 g 1   loratadine (CLARITIN) 10 MG tablet Take 10 mg by mouth daily as needed for allergies.      MAGNESIUM-OXIDE 400 (240 Mg) MG tablet Take 1 tablet by mouth 2 (two) times daily.     MAGNESIUM-OXIDE 400 (241.3 Mg) MG tablet TAKE 1 TABLET(400 MG) BY MOUTH TWICE DAILY 60 tablet 2   potassium  chloride (KLOR-CON) 10 MEQ tablet TAKE 2 TABLETS(20 MEQ) BY MOUTH TWICE DAILY 120 tablet 3   venlafaxine (EFFEXOR) 37.5 MG tablet Take 2 tablets (75 mg total) by mouth daily. 60 tablet 5   No current facility-administered medications for this visit.   Family History  Problem Relation Age of Onset   Diabetes Mother    Breast cancer Mother 2   Cancer Mother    Diabetes Maternal Grandmother    Stomach cancer Maternal Grandfather    Breast cancer Maternal Aunt        dx in her 64s   Diabetes Maternal Aunt      PHYSICAL EXAMINATION: Performance status (ECOG): 0 - Asymptomatic  Vitals:   06/24/21 1300  BP: 113/86  Pulse: 78  Resp: 18  Temp: (!) 96.8 F (36 C)   Wt Readings from Last 3 Encounters:  06/24/21 166 lb 14.2 oz (75.7 kg)  03/03/21 164 lb (74.4 kg)  02/25/21 165 lb 6.4 oz (75 kg)   Physical Exam Constitutional:      General: She is not in acute distress. HENT:     Head: Normocephalic.  Pulmonary:     Effort: No respiratory distress.  Neurological:     Mental Status: She is alert and oriented to person, place, and time.  Psychiatric:        Mood and Affect: Mood normal.        Behavior: Behavior normal.    LABORATORY DATA:  I have reviewed the data as listed CMP Latest Ref Rng & Units 05/19/2021 02/14/2021 01/27/2021  Glucose 70 - 99 mg/dL 82 121(H) 109(H)  BUN 6 - 20 mg/dL 12 14 17   Creatinine 0.44 - 1.00 mg/dL 0.56 0.63 0.68  Sodium 135 - 145 mmol/L 138 136 139  Potassium 3.5 - 5.1 mmol/L 3.6 3.5 3.4(L)  Chloride 98 - 111 mmol/L 105 101 106  CO2 22 - 32 mmol/L 26 28 25   Calcium 8.9 - 10.3 mg/dL 9.9 10.2 10.0  Total Protein 6.5 - 8.1 g/dL 7.8 7.7 7.5  Total Bilirubin 0.3 - 1.2 mg/dL 0.6 0.8 0.6  Alkaline Phos 38 - 126 U/L 93 77 81  AST 15 - 41 U/L 24 20 20   ALT 0 - 44 U/L 36 30 27   Lab Results  Component Value Date   CAN153 18.5 05/19/2021   CAN153 19.4 01/27/2021   Lab Results  Component Value Date   WBC 8.4 05/19/2021   HGB 13.9 05/19/2021    HCT 41.3 05/19/2021   MCV 90.4 05/19/2021   PLT 309 05/19/2021   NEUTROABS 4.4 05/19/2021    ASSESSMENT:  1.  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 -Herceptin and Pertuzumab maintenance started on 01/02/2020. -Bilateral mastectomies on 01/24/2020.  Pathology showed YPT0Y PN 0, 0/6 lymph nodes involved. -She met with Dr. Sondra Come who did not recommend adjuvant radiation. -Anastrozole to start on 04/24/2020. Tolerating well overall. Has hot flashes (see  below). Labs from 05/19/21 reviewed and stable.  - Continue surveillance every 4 months. Chest MRI if concerning symptoms   2.  BRCA1 positive: -Bilateral mastectomies on 01/24/2020. -Risk Reducing TAH/BSO on 04/14/2020. - Recommend genetic testing in children if not already done  3. 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 Hypokalemia - resolved  5. Hypomagnesemia - resolved  6. Bone Health - to prevent, minimize bone loss associated with cancer treatments, AI therapy, and early menopause, recommend calcium 1200 mg, vitamin d 1000 iu daily along with weight bearing exercise - Discuss bone density scan at next visit  7. Foot pain - suspect plantar fascitis. Encouraged her ot discuss with her pcp - May benefit from sling, arch supports, and 2 weeks of anti-inflammatories   Return to clinic  4 months with labs, MD follow up   I discussed the assessment and treatment plan with the patient. The patient was provided an opportunity to ask questions and all were answered. The patient agreed with the plan and demonstrated an understanding of the instructions.   The patient was advised to call back or seek an in-person evaluation if the symptoms worsen  or if the condition fails to improve as anticipated.   I spent 20 minutes face-to-face video visit time dedicated to the care of this patient on the date of this encounter to include pre-visit review of <specify which records>, face-to-face time with the patient, and post visit ordering of testing/documentation.   Beckey Rutter, DNP, AGNP-C [facility]

## 2021-09-01 ENCOUNTER — Ambulatory Visit: Payer: Medicaid Other | Admitting: Internal Medicine

## 2021-10-25 ENCOUNTER — Other Ambulatory Visit (HOSPITAL_COMMUNITY): Payer: Self-pay | Admitting: *Deleted

## 2021-10-25 DIAGNOSIS — Z79811 Long term (current) use of aromatase inhibitors: Secondary | ICD-10-CM

## 2021-10-25 DIAGNOSIS — C50411 Malignant neoplasm of upper-outer quadrant of right female breast: Secondary | ICD-10-CM

## 2021-10-25 DIAGNOSIS — Z170 Estrogen receptor positive status [ER+]: Secondary | ICD-10-CM

## 2021-10-26 ENCOUNTER — Inpatient Hospital Stay (HOSPITAL_COMMUNITY): Payer: Medicaid Other | Attending: Hematology

## 2021-10-26 ENCOUNTER — Other Ambulatory Visit: Payer: Self-pay

## 2021-10-26 DIAGNOSIS — Z170 Estrogen receptor positive status [ER+]: Secondary | ICD-10-CM | POA: Insufficient documentation

## 2021-10-26 DIAGNOSIS — R232 Flushing: Secondary | ICD-10-CM | POA: Insufficient documentation

## 2021-10-26 DIAGNOSIS — Z1501 Genetic susceptibility to malignant neoplasm of breast: Secondary | ICD-10-CM | POA: Insufficient documentation

## 2021-10-26 DIAGNOSIS — C50411 Malignant neoplasm of upper-outer quadrant of right female breast: Secondary | ICD-10-CM | POA: Insufficient documentation

## 2021-10-26 DIAGNOSIS — E876 Hypokalemia: Secondary | ICD-10-CM | POA: Diagnosis not present

## 2021-10-26 DIAGNOSIS — Z79811 Long term (current) use of aromatase inhibitors: Secondary | ICD-10-CM

## 2021-10-26 LAB — COMPREHENSIVE METABOLIC PANEL
ALT: 52 U/L — ABNORMAL HIGH (ref 0–44)
AST: 36 U/L (ref 15–41)
Albumin: 4.3 g/dL (ref 3.5–5.0)
Alkaline Phosphatase: 92 U/L (ref 38–126)
Anion gap: 9 (ref 5–15)
BUN: 17 mg/dL (ref 6–20)
CO2: 23 mmol/L (ref 22–32)
Calcium: 10.1 mg/dL (ref 8.9–10.3)
Chloride: 103 mmol/L (ref 98–111)
Creatinine, Ser: 0.55 mg/dL (ref 0.44–1.00)
GFR, Estimated: >60 mL/min (ref >60)
Glucose, Bld: 106 mg/dL — ABNORMAL HIGH (ref 70–99)
Potassium: 3.4 mmol/L — ABNORMAL LOW (ref 3.5–5.1)
Sodium: 135 mmol/L (ref 135–145)
Total Bilirubin: 0.6 mg/dL (ref 0.3–1.2)
Total Protein: 8 g/dL (ref 6.5–8.1)

## 2021-10-26 LAB — CBC WITH DIFFERENTIAL/PLATELET
Abs Immature Granulocytes: 0.02 10*3/uL (ref 0.00–0.07)
Basophils Absolute: 0 10*3/uL (ref 0.0–0.1)
Basophils Relative: 0 %
Eosinophils Absolute: 0.1 10*3/uL (ref 0.0–0.5)
Eosinophils Relative: 1 %
HCT: 41.9 % (ref 36.0–46.0)
Hemoglobin: 13.9 g/dL (ref 12.0–15.0)
Immature Granulocytes: 0 %
Lymphocytes Relative: 43 %
Lymphs Abs: 3.3 10*3/uL (ref 0.7–4.0)
MCH: 29.7 pg (ref 26.0–34.0)
MCHC: 33.2 g/dL (ref 30.0–36.0)
MCV: 89.5 fL (ref 80.0–100.0)
Monocytes Absolute: 0.4 10*3/uL (ref 0.1–1.0)
Monocytes Relative: 6 %
Neutro Abs: 3.8 10*3/uL (ref 1.7–7.7)
Neutrophils Relative %: 50 %
Platelets: 324 10*3/uL (ref 150–400)
RBC: 4.68 MIL/uL (ref 3.87–5.11)
RDW: 12.6 % (ref 11.5–15.5)
WBC: 7.7 10*3/uL (ref 4.0–10.5)
nRBC: 0 % (ref 0.0–0.2)

## 2021-10-26 LAB — VITAMIN D 25 HYDROXY (VIT D DEFICIENCY, FRACTURES): Vit D, 25-Hydroxy: 38.77 ng/mL (ref 30–100)

## 2021-10-27 ENCOUNTER — Ambulatory Visit
Admission: EM | Admit: 2021-10-27 | Discharge: 2021-10-27 | Disposition: A | Discharge status: Home / Self Care | Payer: Medicaid Other | Attending: Family Medicine | Admitting: Family Medicine

## 2021-10-27 ENCOUNTER — Encounter: Payer: Self-pay | Admitting: Emergency Medicine

## 2021-10-27 DIAGNOSIS — Z20828 Contact with and (suspected) exposure to other viral communicable diseases: Secondary | ICD-10-CM | POA: Diagnosis not present

## 2021-10-27 DIAGNOSIS — J020 Streptococcal pharyngitis: Secondary | ICD-10-CM

## 2021-10-27 LAB — CANCER ANTIGEN 15-3: CA 15-3: 20.5 U/mL (ref 0.0–25.0)

## 2021-10-27 LAB — POCT RAPID STREP A (OFFICE): Rapid Strep A Screen: POSITIVE — AB

## 2021-10-27 MED ORDER — LIDOCAINE VISCOUS HCL 2 % MT SOLN: 10.0000 mL | OROMUCOSAL | 0 refills | Status: DC | PRN

## 2021-10-27 MED ORDER — AMOXICILLIN 875 MG PO TABS: 875.0000 mg | ORAL_TABLET | Freq: Two times a day (BID) | ORAL | 0 refills | Status: DC

## 2021-10-27 NOTE — ED Provider Notes (Signed)
[facility]    CSN: [id number] Arrival date & time: 10/27/21  1242      History   Chief Complaint No chief complaint on file.   HPI Monica Hancock is a 39 y.o. female.   Patient presenting today with sore, swollen throat, body aches since waking up this morning.  She states she has been unable to eat or drink anything since onset of symptoms.  She denies fever, chills, congestion, cough, difficulty breathing.  Multiple sick contacts recently at her work with strep, flu.  So far taking Tylenol with minimal relief of symptoms.   Past Medical History:  Diagnosis Date   BRCA1 positive    Cancer ([place])    Right Breast   Family history of adverse reaction to anesthesia    PONV-children   Family history of BRCA1 gene positive    Family history of breast cancer    H/O Bell's palsy 2000   Left sided   HSV infection     Patient Active Problem List   Diagnosis Date Noted   Long term current use of aromatase inhibitor 06/24/2021   Hot flashes due to surgical menopause 06/24/2021   Encounter to establish care 02/25/2021   Hot flashes 02/25/2021   RBBB 02/25/2021   Shingles 02/25/2021   Status post abdominal hysterectomy 04/14/2020   S/P bilateral mastectomy 01/24/2020   Elevated alkaline phosphatase level 11/14/2019   Malignant neoplasm of upper-outer quadrant of right female breast ([place]) 08/07/2019   Breast lump on right side at 10 o'clock position 08/01/2019   Breast lump on left side at 3 o'clock position 08/01/2019   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   Family history of BRCA1 gene positive     Past Surgical History:  Procedure Laterality Date   ABDOMINAL HYSTERECTOMY N/A    [employer] 02/25/2021   BREAST BIOPSY Right    BREAST SURGERY N/A    [employer] 02/25/2021   IR IMAGING GUIDED PORT INSERTION  08/26/2019   Right   MASTECTOMY MODIFIED RADICAL Right 01/24/2020   Procedure: RIGHT MASTECTOMY MODIFIED RADICAL;  Surgeon:  Aviva Signs, MD;  Location: [facility];  Service: General;  Laterality: Right;   SALPINGOOPHORECTOMY Bilateral 04/14/2020   Procedure: OPEN BILATERAL SALPINGO OOPHORECTOMY;  Surgeon: [name], MD;  Location: [facility];  Service: Gynecology;  Laterality: Bilateral;   SIMPLE MASTECTOMY WITH AXILLARY SENTINEL NODE BIOPSY Left 01/24/2020   Procedure: LEFT SIMPLE MASTECTOMY;  Surgeon: Aviva Signs, MD;  Location: [facility];  Service: General;  Laterality: Left;   SUPRACERVICAL ABDOMINAL HYSTERECTOMY N/A 04/14/2020   Procedure: HYSTERECTOMY SUPRACERVICAL ABDOMINAL;  Surgeon: [name], MD;  Location: [facility];  Service: Gynecology;  Laterality: N/A;   TUBAL LIGATION      OB History     Gravida  5   Para  5   Term  4   Preterm  1   AB  0   Living  5      SAB  0   IAB  0   Ectopic  0   Multiple  0   Live Births  5            Home Medications    Prior to Admission medications   Medication Sig Start Date End Date Taking? Authorizing Provider  amoxicillin (AMOXIL) 875 MG tablet Take 1 tablet (875 mg total) by mouth 2 (two) times daily. 10/27/21  Yes Volney American, PA-C  lidocaine (XYLOCAINE) 2 % solution Use as directed 10  mLs in the mouth or throat as needed for mouth pain. 10/27/21  Yes Volney American, PA-C  anastrozole (ARIMIDEX) 1 MG tablet TAKE 1 TABLET(1 MG) BY MOUTH DAILY 01/18/21   Derek Jack, MD  diphenhydrAMINE (BENADRYL) 50 MG tablet Take 50 mg by mouth every 8 (eight) hours as needed for allergies.    [provider]  ibuprofen (ADVIL) 600 MG tablet Take 1 tablet (600 mg total) by mouth every 6 (six) hours as needed for fever or headache. 04/16/20   [name], MD  lidocaine-prilocaine (EMLA) cream Apply 1 application topically See admin instructions. Apply small amount to port a cath and cover with plastic wrap 1 hour prior to chemotherapy 02/03/21   Derek Jack, MD  loratadine (CLARITIN) 10 MG tablet Take 10 mg by  mouth daily as needed for allergies.     [provider]  MAGNESIUM-OXIDE 400 (240 Mg) MG tablet Take 1 tablet by mouth 2 (two) times daily. 05/01/21   [provider]  MAGNESIUM-OXIDE 400 (241.3 Mg) MG tablet TAKE 1 TABLET(400 MG) BY MOUTH TWICE DAILY 02/25/21   Derek Jack, MD  potassium chloride (KLOR-CON) 10 MEQ tablet TAKE 2 TABLETS(20 MEQ) BY MOUTH TWICE DAILY 09/07/20   Derek Jack, MD    Family History Family History  Problem Relation Age of Onset   Diabetes Mother    Breast cancer Mother 66   Cancer Mother    Diabetes Maternal Grandmother    Stomach cancer Maternal Grandfather    Breast cancer Maternal Aunt        dx in her 23s   Diabetes Maternal Aunt     Social History Social History   Tobacco Use   Smoking status: Never   Smokeless tobacco: Never  Vaping Use   Vaping Use: Never used  Substance Use Topics   Alcohol use: No   Drug use: No     Allergies   Patient has no known allergies.   Review of Systems Review of Systems Per HPI  Physical Exam Triage Vital Signs ED Triage Vitals  Enc Vitals Group     BP 10/27/21 1319 121/88     Pulse Rate 10/27/21 1319 (!) 104     Resp 10/27/21 1319 18     Temp 10/27/21 1319 99.3 F (37.4 C)     Temp Source 10/27/21 1319 Oral     SpO2 10/27/21 1319 97 %     Weight --      Height --      Head Circumference --      Peak Flow --      Pain Score 10/27/21 1320 8     Pain Loc --      Pain Edu? --      Excl. in [place]? --    No data found.  Updated Vital Signs BP 121/88 (BP Location: Right Arm)   Pulse (!) 104   Temp 99.3 F (37.4 C) (Oral)   Resp 18   LMP 09/29/2019   SpO2 97%   Visual Acuity Right Eye Distance:   Left Eye Distance:   Bilateral Distance:    Right Eye Near:   Left Eye Near:    Bilateral Near:     Physical Exam Vitals and nursing note reviewed.  Constitutional:      Appearance: Normal appearance. She is not ill-appearing.  HENT:     Head:  Atraumatic.     Right Ear: Tympanic membrane normal.     Left Ear: Tympanic  membrane normal.     Nose: Nose normal.     Mouth/Throat:     Mouth: Mucous membranes are moist.     Pharynx: Oropharyngeal exudate and posterior oropharyngeal erythema present.     Comments: Uvula midline, oral airway patent Eyes:     Extraocular Movements: Extraocular movements intact.     Conjunctiva/sclera: Conjunctivae normal.  Cardiovascular:     Rate and Rhythm: Normal rate and regular rhythm.     Heart sounds: Normal heart sounds.  Pulmonary:     Effort: Pulmonary effort is normal. No respiratory distress.     Breath sounds: Normal breath sounds. No wheezing or rales.  Musculoskeletal:        General: Normal range of motion.     Cervical back: Normal range of motion and neck supple.  Skin:    General: Skin is warm and dry.  Neurological:     Mental Status: She is alert and oriented to person, place, and time.  Psychiatric:        Mood and Affect: Mood normal.        Thought Content: Thought content normal.        Judgment: Judgment normal.     UC Treatments / Results  Labs (all labs ordered are listed, but only abnormal results are displayed) Labs Reviewed  POCT RAPID STREP A (OFFICE) - Abnormal; Notable for the following components:      Result Value   Rapid Strep A Screen Positive (*)    All other components within normal limits  COVID-19, FLU A+B NAA    EKG   Radiology No results found.  Procedures Procedures (including critical care time)  Medications Ordered in UC Medications - No data to display  Initial Impression / Assessment and Plan / UC Course  I have reviewed the triage vital signs and the nursing notes.  Pertinent labs & imaging results that were available during my care of the patient were reviewed by me and considered in my medical decision making (see chart for details).     Mildly tachycardic in triage, otherwise vital signs reassuring rapid strep positive.   Will treat with amoxicillin, viscous lidocaine and write out of work until Friday.  Discussed supportive home care and return precautions.  Final Clinical Impressions(s) / UC Diagnoses   Final diagnoses:  Exposure to the flu  Streptococcal sore throat   Discharge Instructions   None    ED Prescriptions     Medication Sig Dispense Auth. Provider   amoxicillin (AMOXIL) 875 MG tablet Take 1 tablet (875 mg total) by mouth 2 (two) times daily. 20 tablet Volney American, [place]   lidocaine (XYLOCAINE) 2 % solution Use as directed 10 mLs in the mouth or throat as needed for mouth pain. 100 mL Volney American, [place]      PDMP not reviewed this encounter.   Volney American, [place] 10/27/21 1357

## 2021-10-27 NOTE — ED Triage Notes (Signed)
Sore throat that started today.

## 2021-11-01 NOTE — Progress Notes (Signed)
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 [facility])   08/27/2019 - 12/12/2019 Chemotherapy   The patient had palonosetron (ALOXI) injection 0.25 mg, 0.25 mg, Intravenous,  Once, 6 of 6 cycles Administration: 0.25 mg (08/27/2019), 0.25 mg (09/17/2019), 0.25 mg (11/19/2019), 0.25 mg (12/10/2019), 0.25 mg (10/08/2019), 0.25 mg (10/29/2019) pegfilgrastim-jmdb (FULPHILA) injection 6 mg, 6 mg, Subcutaneous,  Once, 6 of 6 cycles Administration: 6 mg (08/29/2019), 6 mg (09/19/2019), 6 mg (11/21/2019), 6 mg (12/12/2019), 6 mg (10/10/2019), 6 mg (10/31/2019) trastuzumab (HERCEPTIN) 600 mg in sodium chloride 0.9 % 250 mL chemo infusion, 567 mg (100 % of original dose 8 mg/kg), Intravenous,  Once, 1 of 1 cycle Dose modification: 8 mg/kg (original dose 8 mg/kg, Cycle 1, Reason: Other (see comments), Comment: using up last of our Herceptin at [facility]) Administration: 600 mg (08/27/2019) CARBOplatin (PARAPLATIN) 780 mg in sodium chloride 0.9 % 250 mL chemo infusion, 780 mg (100 % of original dose 784.8 mg), Intravenous,  Once, 6 of 6 cycles Dose modification:   (original dose 784.8 mg, Cycle 1),   (original dose 784.8 mg, Cycle 2),   (original dose 784.8 mg, Cycle 5), 784.8 mg (original dose 784.8 mg, Cycle 5, Reason: Other (see comments), Comment: scr 0.8 entered),   (original dose 784.8 mg, Cycle 6),   (original dose 784.8 mg, Cycle 3),   (original dose 784.8 mg, Cycle 4) Administration: 780 mg (08/27/2019), 780 mg (09/17/2019), 780 mg (11/19/2019), 780 mg (12/10/2019), 780 mg (10/08/2019), 780 mg (10/29/2019) DOCEtaxel (TAXOTERE) 130 mg in sodium chloride 0.9 % 250 mL chemo infusion, 75 mg/m2 =  130 mg, Intravenous,  Once, 6 of 6 cycles Administration: 130 mg (08/27/2019), 130 mg (09/17/2019), 130 mg (11/19/2019), 130 mg (12/10/2019), 130 mg (10/08/2019), 130 mg (10/29/2019) pertuzumab (PERJETA) 840 mg in sodium chloride 0.9 % 250 mL chemo infusion, 840 mg, Intravenous, Once, 6 of 6 cycles Administration: 840 mg (08/27/2019), 420 mg (09/17/2019), 420 mg (11/19/2019), 420 mg (12/10/2019), 420 mg (10/08/2019), 420 mg (10/29/2019) fosaprepitant (EMEND) 150 mg, dexamethasone (DECADRON) 12 mg in sodium chloride 0.9 % 145 mL IVPB, , Intravenous,  Once, 6 of 6 cycles Administration:  (08/27/2019),  (09/17/2019),  (11/19/2019),  (12/10/2019),  (10/08/2019),  (10/29/2019) trastuzumab-dkst (OGIVRI) 450 mg in sodium chloride 0.9 % 250 mL chemo infusion, 420 mg, Intravenous,  Once, 5 of 5 cycles Administration: 450 mg (09/17/2019), 450 mg (11/19/2019), 420 mg (12/10/2019), 450 mg (10/08/2019), 450 mg (10/29/2019)   for chemotherapy treatment.     01/02/2020 -  Chemotherapy   The patient had trastuzumab (HERCEPTIN) 525 mg in sodium chloride 0.9 % 250 mL chemo infusion, 8 mg/kg = 525 mg (100 % of original dose 8 mg/kg), Intravenous,  Once, 1 of 1 cycle Dose modification: 8 mg/kg (original dose 8 mg/kg, Cycle 6, Reason: Provider Judgment, Comment: Reload per MD due to delay in 1 week from last tx) Administration: 525 mg (04/24/2020) pertuzumab (PERJETA) 420 mg in sodium chloride 0.9 % 250 mL chemo infusion, 420 mg (100 % of original dose 420 mg), Intravenous, Once, 12 of 12 cycles Dose modification: 420 mg (original dose 420 mg, Cycle 1, Reason: Provider Judgment) Administration: 420 mg (01/02/2020), 420 mg (01/23/2020), 420 mg (  03/26/2020), 420 mg (04/24/2020), 420 mg (05/15/2020), 420 mg (06/05/2020), 420 mg (06/26/2020), 420 mg (07/20/2020), 420 mg (02/13/2020), 420 mg (03/05/2020), 420 mg (08/10/2020), 420 mg (10/06/2020) trastuzumab-dkst (OGIVRI) 450 mg in sodium chloride 0.9 % 250 mL chemo infusion, 420 mg (100 % of original  dose 6 mg/kg), Intravenous,  Once, 12 of 12 cycles Dose modification: 6 mg/kg (original dose 6 mg/kg, Cycle 1, Reason: Provider Judgment), 8 mg/kg (original dose 6 mg/kg, Cycle 12, Reason: Provider Judgment, Comment: missed dose >7 days) Administration: 450 mg (01/02/2020), 420 mg (01/23/2020), 420 mg (03/26/2020), 420 mg (05/15/2020), 420 mg (06/05/2020), 420 mg (06/26/2020), 420 mg (07/20/2020), 420 mg (02/13/2020), 420 mg (03/05/2020), 420 mg (08/10/2020), 600 mg (10/06/2020)   for chemotherapy treatment.       CHIEF COMPLIANT: Follow-up for right breast cancer   INTERVAL HISTORY: [name] is a 40 y.o. female here today for follow up of her right breast cancer. Her last visit was on 02/03/2021.   Today she reports feeling good. She reports a sore throat starting on Wednesday. She reports tolerable hot flashes, and she reports occasional pain in her elbows and knees. She takes 2 magnesium tablets once daily. She takes calcium and vitamin D. She denies skin rash. She reports decreased long distance vision. She is not taking potassium.   REVIEW OF SYSTEMS:   Review of Systems  Constitutional:  Negative for appetite change and fatigue.  HENT:   Positive for sore throat.   Eyes:  Positive for eye problems.  Endocrine: Positive for hot flashes.  Musculoskeletal:  Positive for arthralgias (elbows and knees).  Skin:  Negative for rash.  All other systems reviewed and are negative.  I have reviewed the past medical history, past surgical history, social history and family history with the patient and they are unchanged from previous note.   ALLERGIES:   has No Known Allergies.   MEDICATIONS:  Current Outpatient Medications  Medication Sig Dispense Refill   amoxicillin (AMOXIL) 875 MG tablet Take 1 tablet (875 mg total) by mouth 2 (two) times daily. 20 tablet 0   anastrozole (ARIMIDEX) 1 MG tablet TAKE 1 TABLET(1 MG) BY MOUTH DAILY 30 tablet 6   diphenhydrAMINE (BENADRYL) 50 MG tablet  Take 50 mg by mouth every 8 (eight) hours as needed for allergies.     ibuprofen (ADVIL) 600 MG tablet Take 1 tablet (600 mg total) by mouth every 6 (six) hours as needed for fever or headache. 30 tablet 0   lidocaine (XYLOCAINE) 2 % solution Use as directed 10 mLs in the mouth or throat as needed for mouth pain. 100 mL 0   lidocaine-prilocaine (EMLA) cream Apply 1 application topically See admin instructions. Apply small amount to port a cath and cover with plastic wrap 1 hour prior to chemotherapy 30 g 1   loratadine (CLARITIN) 10 MG tablet Take 10 mg by mouth daily as needed for allergies.      MAGNESIUM-OXIDE 400 (240 Mg) MG tablet Take 1 tablet by mouth 2 (two) times daily.     MAGNESIUM-OXIDE 400 (241.3 Mg) MG tablet TAKE 1 TABLET(400 MG) BY MOUTH TWICE DAILY 60 tablet 2   potassium chloride (KLOR-CON) 10 MEQ tablet TAKE 2 TABLETS(20 MEQ) BY MOUTH TWICE DAILY (Patient not taking: Reported on 11/02/2021) 120 tablet 3   No current facility-administered medications for this visit.     PHYSICAL EXAMINATION: Performance status (ECOG): 0 - Asymptomatic  Vitals:   11/02/21 1500  BP: 116/65  Pulse: 82  Resp: 18  Temp: 98.2 F (36.8 C)  SpO2: 99%   Wt Readings from Last 3 Encounters:  11/02/21 166 lb 1.6 oz (75.3 kg)  06/24/21 166 lb 14.2 oz (75.7 kg)  03/03/21 164 lb (74.4 kg)   Physical Exam Vitals reviewed.  Constitutional:      Appearance: Normal appearance.  Cardiovascular:     Rate and Rhythm: Normal rate and regular rhythm.     Pulses: Normal pulses.     Heart sounds: Normal heart sounds.  Pulmonary:     Effort: Pulmonary effort is normal.     Breath sounds: Normal breath sounds.  Chest:  Breasts:    Right: Absent. No mass, skin change or tenderness.     Left: Absent. No mass, skin change or tenderness.  Abdominal:     Palpations: Abdomen is soft. There is no hepatomegaly, splenomegaly or mass.     Tenderness: There is no abdominal tenderness.  Lymphadenopathy:      Upper Body:     Right upper body: No supraclavicular, axillary or pectoral adenopathy.     Left upper body: No supraclavicular, axillary or pectoral adenopathy.  Neurological:     General: No focal deficit present.     Mental Status: She is alert and oriented to person, place, and time.  Psychiatric:        Mood and Affect: Mood normal.        Behavior: Behavior normal.    Breast Exam Chaperone: Thana Ates     LABORATORY DATA:  I have reviewed the data as listed CMP Latest Ref Rng & Units 10/26/2021 05/19/2021 02/14/2021  Glucose 70 - 99 mg/dL 106(H) 82 121(H)  BUN 6 - 20 mg/dL _0 Creatinine 0.44 - 1.00 mg/dL 0.55 0.56 0.63  Sodium 135 - 145 mmol/L 135 138 136  Potassium 3.5 - 5.1 mmol/L 3.4(L) 3.6 3.5  Chloride 98 - 111 mmol/L 103 105 101  CO2 22 - 32 mmol/L _1 Calcium 8.9 - 10.3 mg/dL 10.1 9.9 10.2  Total Protein 6.5 - 8.1 g/dL 8.0 7.8 7.7  Total Bilirubin 0.3 - 1.2 mg/dL 0.6 0.6 0.8  Alkaline Phos 38 - 126 U/L 92 93 77  AST 15 - 41 U/L 36 24 20  ALT 0 - 44 U/L 52(H) 36 30   Lab Results  Component Value Date   CAN153 20.5 10/26/2021   CAN153 18.5 05/19/2021   CAN153 19.4 01/27/2021   Lab Results  Component Value Date   WBC 7.7 10/26/2021   HGB 13.9 10/26/2021   HCT 41.9 10/26/2021   MCV 89.5 10/26/2021   PLT 324 10/26/2021   NEUTROABS 3.8 10/26/2021    ASSESSMENT:  1.  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 -Herceptin and Pertuzumab maintenance started on 01/02/2020. -Bilateral mastectomies on 01/24/2020.  Pathology showed YPT0Y PN 0, 0/6 lymph nodes involved. -She met with Dr. Sondra Come who did not recommend adjuvant radiation. -TAH/BSO on 04/14/2020. -Anastrozole to start on 04/24/2020.   2.  BRCA1 positive: -Bilateral mastectomies on 01/24/2020. -TAH/BSO on 04/14/2020.   PLAN:  1.  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 - Bilateral mastectomy sites are within normal limits. - He reports difficulty with distant vision.  She is seeing ophthalmology next week. - We reviewed labs which showed mildly elevated ALT of 52.  We will  keep a close eye on it.  Her CA 15-3 was normal.  CBC was normal.  Vitamin D level was normal. - Continue calcium and vitamin D supplements. - RTC 4 months with repeat labs.  If she is stable, will switch to 16-monthvisits at that time.  We will also talk about possible discontinuation of port at that time as she is having difficulty while she is sleeping on her stomach.   2.  Hot flashes: - She stopped taking Effexor as it did not help. - She reports hot flashes are tolerable.   3.  Hypokalemia: - She stopped taking potassium.  Potassium today is 3.4.   4.  Hypomagnesemia: - She is taking magnesium 2 pills daily.  Breast Cancer therapy associated bone loss: I have recommended calcium, Vitamin D and weight bearing exercises.  Orders placed this encounter:  No orders of the defined types were placed in this encounter.   The patient has a good understanding of the overall plan. She agrees with it. She will call with any problems that may develop before the next visit here.  SDerek Jack MD [phone]  I, KThana Ates am acting as a scribe for Dr. SDerek Jack  I, SDerek JackMD, have reviewed the above documentation for accuracy and completeness, and I agree with the above.

## 2021-11-02 ENCOUNTER — Encounter (HOSPITAL_COMMUNITY): Payer: Self-pay | Admitting: Hematology

## 2021-11-02 ENCOUNTER — Other Ambulatory Visit: Payer: Self-pay

## 2021-11-02 ENCOUNTER — Inpatient Hospital Stay (HOSPITAL_BASED_OUTPATIENT_CLINIC_OR_DEPARTMENT_OTHER): Payer: Medicaid Other | Admitting: Hematology

## 2021-11-02 VITALS — BP 116/65 | HR 82 | Temp 98.2°F | Resp 18 | Ht <= 58 in | Wt 166.1 lb

## 2021-11-02 DIAGNOSIS — C50411 Malignant neoplasm of upper-outer quadrant of right female breast: Secondary | ICD-10-CM | POA: Diagnosis not present

## 2021-11-02 DIAGNOSIS — Z170 Estrogen receptor positive status [ER+]: Secondary | ICD-10-CM

## 2021-11-02 MED ORDER — ANASTROZOLE 1 MG PO TABS: ORAL_TABLET | ORAL | 6 refills | Status: DC

## 2021-11-02 NOTE — Patient Instructions (Signed)
[place] at [facility] Discharge Instructions  You were seen and examined today by Dr. Delton Coombes. Your tumor markers are good!  Follow-up as scheduled in 4 months.   Thank you for choosing [place] at [facility] to provide your oncology and hematology care.  To afford each patient quality time with our provider, please arrive at least 15 minutes before your scheduled appointment time.   If you have a lab appointment with the [facility] please come in thru the Main Entrance and check in at the main information desk.  You need to re-schedule your appointment should you arrive 10 or more minutes late.  We strive to give you quality time with our providers, and arriving late affects you and other patients whose appointments are after yours.  Also, if you no show three or more times for appointments you may be dismissed from the clinic at the providers discretion.     Again, thank you for choosing [facility].  Our hope is that these requests will decrease the amount of time that you wait before being seen by our physicians.       _____________________________________________________________  Should you have questions after your visit to [facility], please contact our office at [phone] and follow the prompts.  Our office hours are 8:00 a.m. and 4:30 p.m. Monday - Friday.  Please note that voicemails left after 4:00 p.m. may not be returned until the following business day.  We are closed weekends and major holidays.  You do have access to a nurse 24-7, just call the main number to the clinic [phone] and do not press any options, hold on the line and a nurse will answer the phone.    For prescription refill requests, have your pharmacy contact our office and allow 72 hours.    Due to Covid, you will need to wear a mask upon entering the hospital. If you do not have a mask, a mask will be given to you at the  Main Entrance upon arrival. For doctor visits, patients may have 1 support person age 79 or older with them. For treatment visits, patients can not have anyone with them due to social distancing guidelines and our immunocompromised population.

## 2021-12-14 ENCOUNTER — Other Ambulatory Visit (HOSPITAL_COMMUNITY): Payer: Self-pay | Admitting: Hematology

## 2022-01-23 ENCOUNTER — Ambulatory Visit
Admission: EM | Admit: 2022-01-23 | Discharge: 2022-01-23 | Disposition: A | Discharge status: Home / Self Care | Payer: Medicaid Other

## 2022-01-23 ENCOUNTER — Other Ambulatory Visit: Payer: Self-pay

## 2022-01-23 DIAGNOSIS — J029 Acute pharyngitis, unspecified: Secondary | ICD-10-CM

## 2022-01-23 DIAGNOSIS — R070 Pain in throat: Secondary | ICD-10-CM | POA: Diagnosis not present

## 2022-01-23 LAB — POCT RAPID STREP A (OFFICE): Rapid Strep A Screen: POSITIVE — AB

## 2022-01-23 MED ORDER — AMOXICILLIN 500 MG PO CAPS: 500.0000 mg | ORAL_CAPSULE | Freq: Two times a day (BID) | ORAL | 0 refills | Status: DC

## 2022-01-23 MED ORDER — NAPROXEN 500 MG PO TABS: 500.0000 mg | ORAL_TABLET | Freq: Two times a day (BID) | ORAL | 0 refills | Status: DC

## 2022-01-23 NOTE — ED Triage Notes (Signed)
Pt reports sore throat x 5 days. Pt has not taken any OTC meds for complaint  ?

## 2022-01-23 NOTE — ED Provider Notes (Signed)
?[place] ? ? ?MRN: [id number] DOB: 1980-12-12 ? ?Subjective:  ? ?Monica Hancock is a 42 y.o. female presenting for 5-day history of acute onset persistent and worsening throat pain, painful swallowing.  Has been using multiple over-the-counter medications with minimal relief.  No cough, chest pain, shortness of breath. ? ?No current facility-administered medications for this encounter. ? ?Current Outpatient Medications:  ?  Calcium Carbonate-Vit D-Min (CALCIUM 1200 PO), Take by mouth., Disp: , Rfl:  ?  amoxicillin (AMOXIL) 875 MG tablet, Take 1 tablet (875 mg total) by mouth 2 (two) times daily., Disp: 20 tablet, Rfl: 0 ?  anastrozole (ARIMIDEX) 1 MG tablet, TAKE 1 TABLET(1 MG) BY MOUTH DAILY, Disp: 30 tablet, Rfl: 6 ?  diphenhydrAMINE (BENADRYL) 50 MG tablet, Take 50 mg by mouth every 8 (eight) hours as needed for allergies., Disp: , Rfl:  ?  ibuprofen (ADVIL) 600 MG tablet, Take 1 tablet (600 mg total) by mouth every 6 (six) hours as needed for fever or headache., Disp: 30 tablet, Rfl: 0 ?  lidocaine (XYLOCAINE) 2 % solution, Use as directed 10 mLs in the mouth or throat as needed for mouth pain., Disp: 100 mL, Rfl: 0 ?  lidocaine-prilocaine (EMLA) cream, Apply 1 application topically See admin instructions. Apply small amount to port a cath and cover with plastic wrap 1 hour prior to chemotherapy, Disp: 30 g, Rfl: 1 ?  loratadine (CLARITIN) 10 MG tablet, Take 10 mg by mouth daily as needed for allergies. , Disp: , Rfl:  ?  MAGNESIUM-OXIDE 400 (240 Mg) MG tablet, Take 1 tablet by mouth 2 (two) times daily., Disp: , Rfl:  ?  MAGNESIUM-OXIDE 400 (240 Mg) MG tablet, TAKE 1 TABLET(400 MG) BY MOUTH TWICE DAILY, Disp: 60 tablet, Rfl: 6 ?  potassium chloride (KLOR-CON) 10 MEQ tablet, TAKE 2 TABLETS(20 MEQ) BY MOUTH TWICE DAILY (Patient not taking: Reported on 11/02/2021), Disp: 120 tablet, Rfl: 3  ? ?No Known Allergies ? ?Past Medical History:  ?Diagnosis Date  ? BRCA1 positive   ? Cancer [facility])    ? Right Breast  ? Family history of adverse reaction to anesthesia   ? PONV-children  ? Family history of BRCA1 gene positive   ? Family history of breast cancer   ? H/O Bell's palsy 2000  ? Left sided  ? HSV infection   ?  ? ?Past Surgical History:  ?Procedure Laterality Date  ? ABDOMINAL HYSTERECTOMY N/A   ? [employer] 02/25/2021  ? BREAST BIOPSY Right   ? BREAST SURGERY N/A   ? [employer] 02/25/2021  ? IR IMAGING GUIDED PORT INSERTION  08/26/2019  ? Right  ? MASTECTOMY MODIFIED RADICAL Right 01/24/2020  ? Procedure: RIGHT MASTECTOMY MODIFIED RADICAL;  Surgeon: Aviva Signs, MD;  Location: [facility]S;  Service: General;  Laterality: Right;  ? SALPINGOOPHORECTOMY Bilateral 04/14/2020  ? Procedure: OPEN BILATERAL SALPINGO OOPHORECTOMY;  Surgeon: [name], MD;  Location: [facility] ORS;  Service: Gynecology;  Laterality: Bilateral;  ? SIMPLE MASTECTOMY WITH AXILLARY SENTINEL NODE BIOPSY Left 01/24/2020  ? Procedure: LEFT SIMPLE MASTECTOMY;  Surgeon: Aviva Signs, MD;  Location: [facility];  Service: General;  Laterality: Left;  ? SUPRACERVICAL ABDOMINAL HYSTERECTOMY N/A 04/14/2020  ? Procedure: HYSTERECTOMY SUPRACERVICAL ABDOMINAL;  Surgeon: [name], MD;  Location: [facility] ORS;  Service: Gynecology;  Laterality: N/A;  ? TUBAL LIGATION    ? ? ?Family History  ?Problem Relation Age of Onset  ? Diabetes Mother   ? Breast cancer Mother 38  ? Cancer  Mother   ? Diabetes Maternal Grandmother   ? Stomach cancer Maternal Grandfather   ? Breast cancer Maternal Aunt   ?     dx in her 24s  ? Diabetes Maternal Aunt   ? ? ?Social History  ? ?Tobacco Use  ? Smoking status: Never  ? Smokeless tobacco: Never  ?Vaping Use  ? Vaping Use: Never used  ?Substance Use Topics  ? Alcohol use: No  ? Drug use: No  ? ? ?ROS ? ? ?Objective:  ? ?Vitals: ?BP 119/86 (BP Location: Left Arm)   Pulse 97   Temp 99.2 ?F (37.3 ?C) (Oral)   Resp 17   LMP 09/29/2019   SpO2 96%  ? ?Physical Exam ?Constitutional:   ?   General: She is not in acute distress. ?    Appearance: Normal appearance. She is well-developed. She is not ill-appearing, toxic-appearing or diaphoretic.  ?HENT:  ?   Head: Normocephalic and atraumatic.  ?   Nose: Nose normal.  ?   Mouth/Throat:  ?   Mouth: Mucous membranes are moist.  ?   Pharynx: Pharyngeal swelling, oropharyngeal exudate and posterior oropharyngeal erythema present. No uvula swelling.  ?   Tonsils: Tonsillar exudate present. No tonsillar abscesses. 0 on the right. 1+ on the left.  ?Eyes:  ?   General: No scleral icterus.    ?   Right eye: No discharge.     ?   Left eye: No discharge.  ?   Extraocular Movements: Extraocular movements intact.  ?Cardiovascular:  ?   Rate and Rhythm: Normal rate.  ?Pulmonary:  ?   Effort: Pulmonary effort is normal.  ?Skin: ?   General: Skin is warm and dry.  ?Neurological:  ?   General: No focal deficit present.  ?   Mental Status: She is alert and oriented to person, place, and time.  ?Psychiatric:     ?   Mood and Affect: Mood normal.     ?   Behavior: Behavior normal.  ? ? ?Results for orders placed or performed during the hospital encounter of 01/23/22 (from the past 24 hour(s))  ?POCT rapid strep A     Status: Abnormal  ? Collection Time: 01/23/22  8:51 AM  ?Result Value Ref Range  ? Rapid Strep A Screen Positive (A) Negative  ? ? ?Assessment and Plan :  ? ?PDMP not reviewed this encounter. ? ?1. Acute pharyngitis, unspecified etiology   ?2. Throat pain   ? ?Will treat for strep pharyngitis.  Patient is to start amoxillin, use supportive care otherwise. Counseled patient on potential for adverse effects with medications prescribed/recommended today, ER and return-to-clinic precautions discussed, patient verbalized understanding. ? ?  ?Jaynee Eagles, PA-C ?01/23/22 [id number] ? ?

## 2022-03-01 ENCOUNTER — Inpatient Hospital Stay (HOSPITAL_COMMUNITY): Payer: Medicaid Other | Attending: Hematology

## 2022-03-01 DIAGNOSIS — Z170 Estrogen receptor positive status [ER+]: Secondary | ICD-10-CM | POA: Insufficient documentation

## 2022-03-01 DIAGNOSIS — Z79899 Other long term (current) drug therapy: Secondary | ICD-10-CM | POA: Diagnosis not present

## 2022-03-01 DIAGNOSIS — C50411 Malignant neoplasm of upper-outer quadrant of right female breast: Secondary | ICD-10-CM | POA: Diagnosis present

## 2022-03-01 LAB — CBC WITH DIFFERENTIAL/PLATELET
Abs Immature Granulocytes: 0.02 10*3/uL (ref 0.00–0.07)
Basophils Absolute: 0 10*3/uL (ref 0.0–0.1)
Basophils Relative: 1 %
Eosinophils Absolute: 0.1 10*3/uL (ref 0.0–0.5)
Eosinophils Relative: 2 %
HCT: 41.8 % (ref 36.0–46.0)
Hemoglobin: 13.8 g/dL (ref 12.0–15.0)
Immature Granulocytes: 0 %
Lymphocytes Relative: 44 %
Lymphs Abs: 3 10*3/uL (ref 0.7–4.0)
MCH: 29.2 pg (ref 26.0–34.0)
MCHC: 33 g/dL (ref 30.0–36.0)
MCV: 88.6 fL (ref 80.0–100.0)
Monocytes Absolute: 0.3 10*3/uL (ref 0.1–1.0)
Monocytes Relative: 5 %
Neutro Abs: 3.4 10*3/uL (ref 1.7–7.7)
Neutrophils Relative %: 48 %
Platelets: 308 10*3/uL (ref 150–400)
RBC: 4.72 MIL/uL (ref 3.87–5.11)
RDW: 12.4 % (ref 11.5–15.5)
WBC: 7 10*3/uL (ref 4.0–10.5)
nRBC: 0 % (ref 0.0–0.2)

## 2022-03-01 LAB — COMPREHENSIVE METABOLIC PANEL
ALT: 40 U/L (ref 0–44)
AST: 28 U/L (ref 15–41)
Albumin: 4 g/dL (ref 3.5–5.0)
Alkaline Phosphatase: 94 U/L (ref 38–126)
Anion gap: 7 (ref 5–15)
BUN: 8 mg/dL (ref 6–20)
CO2: 24 mmol/L (ref 22–32)
Calcium: 9.8 mg/dL (ref 8.9–10.3)
Chloride: 108 mmol/L (ref 98–111)
Creatinine, Ser: 0.67 mg/dL (ref 0.44–1.00)
GFR, Estimated: >60 mL/min (ref >60)
Glucose, Bld: 127 mg/dL — ABNORMAL HIGH (ref 70–99)
Potassium: 3.5 mmol/L (ref 3.5–5.1)
Sodium: 139 mmol/L (ref 135–145)
Total Bilirubin: 0.7 mg/dL (ref 0.3–1.2)
Total Protein: 7.9 g/dL (ref 6.5–8.1)

## 2022-03-01 LAB — VITAMIN D 25 HYDROXY (VIT D DEFICIENCY, FRACTURES): Vit D, 25-Hydroxy: 27.73 ng/mL — ABNORMAL LOW (ref 30–100)

## 2022-03-02 LAB — CANCER ANTIGEN 15-3: CA 15-3: 19.7 U/mL (ref 0.0–25.0)

## 2022-03-08 ENCOUNTER — Inpatient Hospital Stay (HOSPITAL_BASED_OUTPATIENT_CLINIC_OR_DEPARTMENT_OTHER): Payer: Medicaid Other | Admitting: Hematology

## 2022-03-08 VITALS — BP 116/80 | HR 76 | Temp 97.8°F | Resp 18 | Ht <= 58 in | Wt 166.1 lb

## 2022-03-08 DIAGNOSIS — Z170 Estrogen receptor positive status [ER+]: Secondary | ICD-10-CM

## 2022-03-08 DIAGNOSIS — C50411 Malignant neoplasm of upper-outer quadrant of right female breast: Secondary | ICD-10-CM

## 2022-03-08 NOTE — Patient Instructions (Addendum)
[place] at [facility] ?Discharge Instructions ? ?You were seen and examined today by Dr. Delton Coombes. ? ?Dr. Delton Coombes discussed your most recent lab work and everything looks good except your Vitamin D is low. Increase Calcium and Vitamin D to two and half pills daily.  ? ?Please keep follow-up as scheduled in 6 months.  ? ? ?Thank you for choosing [place] at [facility] to provide your oncology and hematology care.  To afford each patient quality time with our provider, please arrive at least 15 minutes before your scheduled appointment time.  ? ?If you have a lab appointment with the [facility] please come in thru the Main Entrance and check in at the main information desk. ? ?You need to re-schedule your appointment should you arrive 10 or more minutes late.  We strive to give you quality time with our providers, and arriving late affects you and other patients whose appointments are after yours.  Also, if you no show three or more times for appointments you may be dismissed from the clinic at the providers discretion.     ?Again, thank you for choosing [facility].  Our hope is that these requests will decrease the amount of time that you wait before being seen by our physicians.       ?_____________________________________________________________ ? ?Should you have questions after your visit to [facility], please contact our office at [phone] and follow the prompts.  Our office hours are 8:00 a.m. and 4:30 p.m. Monday - Friday.  Please note that voicemails left after 4:00 p.m. may not be returned until the following business day.  We are closed weekends and major holidays.  You do have access to a nurse 24-7, just call the main number to the clinic [phone] and do not press any options, hold on the line and a nurse will answer the phone.   ? ?For prescription refill requests, have your pharmacy contact our office and  allow 72 hours.   ? ?Due to Covid, you will need to wear a mask upon entering the hospital. If you do not have a mask, a mask will be given to you at the Main Entrance upon arrival. For doctor visits, patients may have 1 support person age 85 or older with them. For treatment visits, patients can not have anyone with them due to social distancing guidelines and our immunocompromised population.  ? ?  ?

## 2022-03-08 NOTE — Progress Notes (Signed)
? ?[place] ?[address] ?[place], [name] [address] ? ? ?Patient Care Team: ?Lindell Spar, MD as PCP - General (Internal Medicine) ?Derek Jack, MD as Medical Oncologist (Medical Oncology) ? ?SUMMARY OF ONCOLOGIC HISTORY: ?Oncology History  ?Malignant neoplasm of upper-outer quadrant of right female breast ([name])  ?08/07/2019 Initial Diagnosis  ? 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 ?  ?08/27/2019 - 12/12/2019 Chemotherapy  ? The patient had palonosetron (ALOXI) injection 0.25 mg, 0.25 mg, Intravenous,  Once, 6 of 6 cycles ?Administration: 0.25 mg (08/27/2019), 0.25 mg (09/17/2019), 0.25 mg (11/19/2019), 0.25 mg (12/10/2019), 0.25 mg (10/08/2019), 0.25 mg (10/29/2019) ?pegfilgrastim-jmdb (FULPHILA) injection 6 mg, 6 mg, Subcutaneous,  Once, 6 of 6 cycles ?Administration: 6 mg (08/29/2019), 6 mg (09/19/2019), 6 mg (11/21/2019), 6 mg (12/12/2019), 6 mg (10/10/2019), 6 mg (10/31/2019) ?trastuzumab (HERCEPTIN) 600 mg in sodium chloride 0.9 % 250 mL chemo infusion, 567 mg (100 % of original dose 8 mg/kg), Intravenous,  Once, 1 of 1 cycle ?Dose modification: 8 mg/kg (original dose 8 mg/kg, Cycle 1, Reason: Other (see comments), Comment: using up last of our Herceptin at [facility]) ?Administration: 600 mg (08/27/2019) ?CARBOplatin (PARAPLATIN) 780 mg in sodium chloride 0.9 % 250 mL chemo infusion, 780 mg (100 % of original dose 784.8 mg), Intravenous,  Once, 6 of 6 cycles ?Dose modification:   (original dose 784.8 mg, Cycle 1),   (original dose 784.8 mg, Cycle 2),   (original dose 784.8 mg, Cycle 5), 784.8 mg (original dose 784.8 mg, Cycle 5, Reason: Other (see comments), Comment: scr 0.8 entered),   (original dose 784.8 mg, Cycle 6),   (original dose 784.8 mg, Cycle 3),   (original dose 784.8 mg, Cycle 4) ?Administration: 780 mg (08/27/2019), 780 mg (09/17/2019), 780 mg (11/19/2019), 780 mg (12/10/2019), 780 mg (10/08/2019), 780 mg (10/29/2019) ?DOCEtaxel  (TAXOTERE) 130 mg in sodium chloride 0.9 % 250 mL chemo infusion, 75 mg/m2 = 130 mg, Intravenous,  Once, 6 of 6 cycles ?Administration: 130 mg (08/27/2019), 130 mg (09/17/2019), 130 mg (11/19/2019), 130 mg (12/10/2019), 130 mg (10/08/2019), 130 mg (10/29/2019) ?pertuzumab (PERJETA) 840 mg in sodium chloride 0.9 % 250 mL chemo infusion, 840 mg, Intravenous, Once, 6 of 6 cycles ?Administration: 840 mg (08/27/2019), 420 mg (09/17/2019), 420 mg (11/19/2019), 420 mg (12/10/2019), 420 mg (10/08/2019), 420 mg (10/29/2019) ?fosaprepitant (EMEND) 150 mg, dexamethasone (DECADRON) 12 mg in sodium chloride 0.9 % 145 mL IVPB, , Intravenous,  Once, 6 of 6 cycles ?Administration:  (08/27/2019),  (09/17/2019),  (11/19/2019),  (12/10/2019),  (10/08/2019),  (10/29/2019) ?trastuzumab-dkst (OGIVRI) 450 mg in sodium chloride 0.9 % 250 mL chemo infusion, 420 mg, Intravenous,  Once, 5 of 5 cycles ?Administration: 450 mg (09/17/2019), 450 mg (11/19/2019), 420 mg (12/10/2019), 450 mg (10/08/2019), 450 mg (10/29/2019) ? ? for chemotherapy treatment.  ? ?  ?01/02/2020 -  Chemotherapy  ? The patient had trastuzumab (HERCEPTIN) 525 mg in sodium chloride 0.9 % 250 mL chemo infusion, 8 mg/kg = 525 mg (100 % of original dose 8 mg/kg), Intravenous,  Once, 1 of 1 cycle ?Dose modification: 8 mg/kg (original dose 8 mg/kg, Cycle 6, Reason: Provider Judgment, Comment: Reload per MD due to delay in 1 week from last tx) ?Administration: 525 mg (04/24/2020) ?pertuzumab (PERJETA) 420 mg in sodium chloride 0.9 % 250 mL chemo infusion, 420 mg (100 % of original dose 420 mg), Intravenous, Once, 12 of 12 cycles ?Dose modification: 420 mg (original dose 420 mg, Cycle 1, Reason: Provider Judgment) ?  Administration: 420 mg (01/02/2020), 420 mg (01/23/2020), 420 mg (03/26/2020), 420 mg (04/24/2020), 420 mg (05/15/2020), 420 mg (06/05/2020), 420 mg (06/26/2020), 420 mg (07/20/2020), 420 mg (02/13/2020), 420 mg (03/05/2020), 420 mg (08/10/2020), 420 mg (10/06/2020) ?trastuzumab-dkst (OGIVRI) 450  mg in sodium chloride 0.9 % 250 mL chemo infusion, 420 mg (100 % of original dose 6 mg/kg), Intravenous,  Once, 12 of 12 cycles ?Dose modification: 6 mg/kg (original dose 6 mg/kg, Cycle 1, Reason: Provider Judgment), 8 mg/kg (original dose 6 mg/kg, Cycle 12, Reason: Provider Judgment, Comment: missed dose >7 days) ?Administration: 450 mg (01/02/2020), 420 mg (01/23/2020), 420 mg (03/26/2020), 420 mg (05/15/2020), 420 mg (06/05/2020), 420 mg (06/26/2020), 420 mg (07/20/2020), 420 mg (02/13/2020), 420 mg (03/05/2020), 420 mg (08/10/2020), 600 mg (10/06/2020) ? ? for chemotherapy treatment.  ? ?  ? ? ?CHIEF COMPLIANT: Follow-up for right breast cancer ? ? ?INTERVAL HISTORY: [name] is a 41 y.o. female here today for follow up of her right breast cancer. Her last visit was on 11/02/2021.  ? ?Today she reports feeling good. She continues to take Magnesium. Her hot flashes are stable. She is taking anastrozole and tolerating it well. She continues to take 2 tablets of calcium and 1 tablet of vitamin D daily.  She denies tingling/numbness in her hands and feet.  ? ?REVIEW OF SYSTEMS:   ?Review of Systems  ?Constitutional:  Negative for appetite change and fatigue.  ?Endocrine: Positive for hot flashes (stable).  ?All other systems reviewed and are negative. ? ?I have reviewed the past medical history, past surgical history, social history and family history with the patient and they are unchanged from previous note. ? ? ?ALLERGIES:   ?has No Known Allergies. ? ? ?MEDICATIONS:  ?Current Outpatient Medications  ?Medication Sig Dispense Refill  ? amoxicillin (AMOXIL) 500 MG capsule Take 1 capsule (500 mg total) by mouth 2 (two) times daily. 20 capsule 0  ? anastrozole (ARIMIDEX) 1 MG tablet TAKE 1 TABLET(1 MG) BY MOUTH DAILY 30 tablet 6  ? Calcium Carbonate-Vit D-Min (CALCIUM 1200 PO) Take by mouth.    ? diphenhydrAMINE (BENADRYL) 50 MG tablet Take 50 mg by mouth every 8 (eight) hours as needed for allergies.    ? ibuprofen  (ADVIL) 600 MG tablet Take 1 tablet (600 mg total) by mouth every 6 (six) hours as needed for fever or headache. 30 tablet 0  ? lidocaine (XYLOCAINE) 2 % solution Use as directed 10 mLs in the mouth or throat as needed for mouth pain. 100 mL 0  ? lidocaine-prilocaine (EMLA) cream Apply 1 application topically See admin instructions. Apply small amount to port a cath and cover with plastic wrap 1 hour prior to chemotherapy 30 g 1  ? loratadine (CLARITIN) 10 MG tablet Take 10 mg by mouth daily as needed for allergies.     ? MAGNESIUM-OXIDE 400 (240 Mg) MG tablet Take 1 tablet by mouth 2 (two) times daily.    ? MAGNESIUM-OXIDE 400 (240 Mg) MG tablet TAKE 1 TABLET(400 MG) BY MOUTH TWICE DAILY 60 tablet 6  ? naproxen (NAPROSYN) 500 MG tablet Take 1 tablet (500 mg total) by mouth 2 (two) times daily with a meal. 30 tablet 0  ? potassium chloride (KLOR-CON) 10 MEQ tablet TAKE 2 TABLETS(20 MEQ) BY MOUTH TWICE DAILY 120 tablet 3  ? ?No current facility-administered medications for this visit.  ? ? ? ?PHYSICAL EXAMINATION: ?Performance status (ECOG): 0 - Asymptomatic ? ?Vitals:  ? 03/08/22 1524  ?BP: 116/80  ?  Pulse: 76  ?Resp: 18  ?Temp: 97.8 ?F (36.6 ?C)  ?SpO2: 98%  ? ?Wt Readings from Last 3 Encounters:  ?03/08/22 166 lb 1.6 oz (75.3 kg)  ?11/02/21 166 lb 1.6 oz (75.3 kg)  ?06/24/21 166 lb 14.2 oz (75.7 kg)  ? ?Physical Exam ?Vitals reviewed.  ?Constitutional:   ?   Appearance: Normal appearance. She is obese.  ?Cardiovascular:  ?   Rate and Rhythm: Normal rate and regular rhythm.  ?   Pulses: Normal pulses.  ?   Heart sounds: Normal heart sounds.  ?Pulmonary:  ?   Effort: Pulmonary effort is normal.  ?   Breath sounds: Normal breath sounds.  ?Chest:  ?Breasts: ?   Right: No swelling, bleeding, mass, skin change (mastectomy site WNL) or tenderness.  ?   Left: No swelling, bleeding, mass, skin change (mastectomy site WNL) or tenderness.  ?   Comments: R chest wall port-a-cath ?Lymphadenopathy:  ?   Upper Body:  ?   Right  upper body: No supraclavicular or axillary adenopathy.  ?   Left upper body: No supraclavicular or axillary adenopathy.  ?Neurological:  ?   General: No focal deficit present.  ?   Mental Status: She is alert

## 2022-04-22 ENCOUNTER — Other Ambulatory Visit: Payer: Self-pay | Admitting: Radiology

## 2022-04-25 ENCOUNTER — Ambulatory Visit (HOSPITAL_COMMUNITY)
Admission: RE | Admit: 2022-04-25 | Discharge: 2022-04-25 | Disposition: A | Discharge status: Home / Self Care | Payer: Medicaid Other | Source: Ambulatory Visit | Attending: Hematology | Admitting: Hematology

## 2022-04-25 ENCOUNTER — Encounter (HOSPITAL_COMMUNITY): Payer: Self-pay

## 2022-04-25 DIAGNOSIS — Z9221 Personal history of antineoplastic chemotherapy: Secondary | ICD-10-CM | POA: Diagnosis not present

## 2022-04-25 DIAGNOSIS — Z170 Estrogen receptor positive status [ER+]: Secondary | ICD-10-CM

## 2022-04-25 DIAGNOSIS — Z853 Personal history of malignant neoplasm of breast: Secondary | ICD-10-CM | POA: Insufficient documentation

## 2022-04-25 DIAGNOSIS — Z9013 Acquired absence of bilateral breasts and nipples: Secondary | ICD-10-CM | POA: Diagnosis not present

## 2022-04-25 DIAGNOSIS — Z452 Encounter for adjustment and management of vascular access device: Secondary | ICD-10-CM | POA: Insufficient documentation

## 2022-04-25 HISTORY — PX: IR REMOVAL TUN ACCESS W/ PORT W/O FL MOD SED: IMG2290

## 2022-04-25 MED ORDER — MIDAZOLAM HCL 2 MG/2ML IJ SOLN
INTRAMUSCULAR | Status: AC | PRN
  Administered 2022-04-25: 1 mg via INTRAVENOUS

## 2022-04-25 MED ORDER — LIDOCAINE-EPINEPHRINE 1 %-1:100000 IJ SOLN
INTRAMUSCULAR | Status: AC | PRN
  Administered 2022-04-25: 10 mL via INTRADERMAL

## 2022-04-25 MED ORDER — FENTANYL CITRATE (PF) 100 MCG/2ML IJ SOLN
INTRAMUSCULAR | Status: AC | PRN
  Administered 2022-04-25: 50 ug via INTRAVENOUS

## 2022-04-25 MED ORDER — SODIUM CHLORIDE 0.9 % IV SOLN: INTRAVENOUS | Status: DC

## 2022-04-25 NOTE — Procedures (Signed)
Vascular and Interventional Radiology Procedure Note  Patient: Monica Hancock DOB: Apr 24, 1981 Medical Record Number: [id number] Note Date/Time: 04/25/22 12:53 PM   Performing Physician: Michaelle Birks, MD Assistant(s): None  Diagnosis: Breast cancer  Procedure: PORT REMOVAL  Anesthesia: Conscious Sedation Complications: None Estimated Blood Loss: Minimal Specimens:  None  Findings:  Successful removal of a right-sided venous port. Primary incision closure. Dermabond at skin.  See detailed procedure note with images in PACS. The patient tolerated the procedure well without incident or complication and was returned to Short Stay in stable condition.    Michaelle Birks, MD [facility] Interventional Radiology [facility] Radiology   Pager. [place]

## 2022-04-25 NOTE — Discharge Instructions (Signed)
Implanted Port Removal, Care After The following information offers guidance on how to care for yourself after your procedure. Your health care provider may also give you more specific instructions. If you have problems or questions, contact your health care provider.  Urgent needs - Interventional Radiology on call MD [phone] Wound - May remove dressing and shower in 24 to 48 hours.  Keep site clean and dry.  Replace with bandaid as needed.  Do not submerge in tub or water until site healing well. If closed with glue, glue will flake off on its own.  What can I expect after the procedure? After the procedure, it is common to have: Soreness or pain near your incision. Some swelling or bruising near your incision. Follow these instructions at home: Medicines Take over-the-counter and prescription medicines only as told by your health care provider. If you were prescribed an antibiotic medicine, take it as told by your health care provider. Do not stop taking the antibiotic even if you start to feel better. Bathing Do not take baths, swim, or use a hot tub until your health care provider approves. Ask your health care provider if you can take showers. You may only be allowed to take sponge baths. Incision care Follow instructions from your health care provider about how to take care of your incision. Make sure you: Wash your hands with soap and water for at least 20 seconds before and after you change your bandage (dressing). If soap and water are not available, use hand sanitizer. Change your dressing as told by your health care provider. Keep your dressing dry. Leave stitches (sutures), skin glue, or adhesive strips in place. These skin closures may need to stay in place for 2 weeks or longer. If adhesive strip edges start to loosen and curl up, you may trim the loose edges. Do not remove adhesive strips completely unless your health care provider tells you to do that. Check your incision  area every day for signs of infection. Check for: More redness, swelling, or pain. More fluid or blood. Warmth. Pus or a bad smell. Activity Return to your normal activities as told by your health care provider. Ask your health care provider what activities are safe for you. You may have to avoid lifting. Ask your health care provider how much you can safely lift. Do not do activities that involve lifting your arms over your head. Driving If you were given a sedative during the procedure, it can affect you for several hours. Do not drive or operate machinery until your health care provider says that it is safe. If you did not receive a sedative, ask your health care provider when it is safe to drive. General instructions Do not use any products that contain nicotine or tobacco. These products include cigarettes, chewing tobacco, and vaping devices, such as e-cigarettes. These can delay healing after surgery. If you need help quitting, ask your health care provider. Keep all follow-up visits. This is important. Contact a health care provider if: You have a fever or chills. You have more redness, swelling, or pain around your incision. You have more fluid or blood coming from your incision. Your incision feels warm to the touch. You have pus or a bad smell coming from your incision. You have pain that is not relieved by your pain medicine. Get help right away if: You have chest pain. You have difficulty breathing. These symptoms may be an emergency. Get help right away. Call 911. Do not wait to   see if the symptoms will go away. Do not drive yourself to the hospital. Summary After the procedure, it is common to have pain, soreness, swelling, or bruising near your incision. If you were prescribed an antibiotic medicine, take it as told by your health care provider. Do not stop taking the antibiotic even if you start to feel better. If you were given a sedative during the procedure, it can  affect you for several hours. Do not drive or operate machinery until your health care provider says that it is safe. Return to your normal activities as told by your health care provider. Ask your health care provider what activities are safe for you. This information is not intended to replace advice given to you by your health care provider. Make sure you discuss any questions you have with your health care provider. Document Revised: 05/11/2021 Document Reviewed: 05/11/2021 Elsevier Patient Education  2023 Elsevier Inc.   Moderate Conscious Sedation, Adult, Care After This sheet gives you information about how to care for yourself after your procedure. Your health care provider may also give you more specific instructions. If you have problems or questions, contact your health care provider. What can I expect after the procedure? After the procedure, it is common to have: Sleepiness for several hours. Impaired judgment for several hours. Difficulty with balance. Vomiting if you eat too soon. Follow these instructions at home: For the time period you were told by your health care provider:   Rest. Do not participate in activities where you could fall or become injured. Do not drive or use machinery. Do not drink alcohol. Do not take sleeping pills or medicines that cause drowsiness. Do not make important decisions or sign legal documents. Do not take care of children on your own. Eating and drinking Follow the diet recommended by your health care provider. Drink enough fluid to keep your urine pale yellow. If you vomit: Drink water, juice, or soup when you can drink without vomiting. Make sure you have little or no nausea before eating solid foods. General instructions Take over-the-counter and prescription medicines only as told by your health care provider. Have a responsible adult stay with you for the time you are told. It is important to have someone help care for you until  you are awake and alert. Do not smoke. Keep all follow-up visits as told by your health care provider. This is important. Contact a health care provider if: You are still sleepy or having trouble with balance after 24 hours. You feel light-headed. You keep feeling nauseous or you keep vomiting. You develop a rash. You have a fever. You have redness or swelling around the IV site. Get help right away if: You have trouble breathing. You have new-onset confusion at home. Summary After the procedure, it is common to feel sleepy, have impaired judgment, or feel nauseous if you eat too soon. Rest after you get home. Know the things you should not do after the procedure. Follow the diet recommended by your health care provider and drink enough fluid to keep your urine pale yellow. Get help right away if you have trouble breathing or new-onset confusion at home. This information is not intended to replace advice given to you by your health care provider. Make sure you discuss any questions you have with your health care provider. Document Revised: 03/06/2020 Document Reviewed: 10/03/2019 Elsevier Patient Education  2023 Elsevier Inc.        

## 2022-04-25 NOTE — H&P (Signed)
Referring Physician(s): [name],[name]  Supervising Physician: Lambert Keto  Patient Status:  Monica Hancock OP  Chief Complaint:  "I'm getting my port out"  Subjective: Patient known to IR service from Port-A-Cath placement on 08/26/2019.  She has a history of right breast carcinoma in 2020, status post bilateral mastectomies and chemotherapy.  She has now completed treatment and is no longer using her Port-A-Cath.  She presents today for Port-A-Cath removal.  She currently denies fever, headache, chest pain, dyspnea, cough, abdominal/back pain, nausea, vomiting or bleeding.  Past Medical History:  Diagnosis Date   BRCA1 positive    Cancer ([place])    Right Breast   Family history of adverse reaction to anesthesia    PONV-children   Family history of BRCA1 gene positive    Family history of breast cancer    H/O Bell's palsy 2000   Left sided   HSV infection    Past Surgical History:  Procedure Laterality Date   ABDOMINAL HYSTERECTOMY N/A    [employer] 02/25/2021   BREAST BIOPSY Right    BREAST SURGERY N/A    [employer] 02/25/2021   IR IMAGING GUIDED PORT INSERTION  08/26/2019   Right   MASTECTOMY MODIFIED RADICAL Right 01/24/2020   Procedure: RIGHT MASTECTOMY MODIFIED RADICAL;  Surgeon: Aviva Signs, MD;  Location: [facility];  Service: General;  Laterality: Right;   SALPINGOOPHORECTOMY Bilateral 04/14/2020   Procedure: OPEN BILATERAL SALPINGO OOPHORECTOMY;  Surgeon: [name], MD;  Location: [facility];  Service: Gynecology;  Laterality: Bilateral;   SIMPLE MASTECTOMY WITH AXILLARY SENTINEL NODE BIOPSY Left 01/24/2020   Procedure: LEFT SIMPLE MASTECTOMY;  Surgeon: Aviva Signs, MD;  Location: [facility];  Service: General;  Laterality: Left;   SUPRACERVICAL ABDOMINAL HYSTERECTOMY N/A 04/14/2020   Procedure: HYSTERECTOMY SUPRACERVICAL ABDOMINAL;  Surgeon: [name], MD;  Location: [facility];  Service: Gynecology;  Laterality: N/A;   TUBAL LIGATION        Allergies: Patient has no  known allergies.  Medications: Prior to Admission medications   Medication Sig Start Date End Date Taking? Authorizing Provider  anastrozole (ARIMIDEX) 1 MG tablet TAKE 1 TABLET(1 MG) BY MOUTH DAILY 11/02/21  Yes Derek Jack, MD  Calcium Carbonate-Vit D-Min (CALCIUM 1200 PO) Take by mouth.   Yes [provider]  diphenhydrAMINE (BENADRYL) 50 MG tablet Take 50 mg by mouth every 8 (eight) hours as needed for allergies.   Yes [provider]  ibuprofen (ADVIL) 600 MG tablet Take 1 tablet (600 mg total) by mouth every 6 (six) hours as needed for fever or headache. 04/16/20  Yes [name], MD  loratadine (CLARITIN) 10 MG tablet Take 10 mg by mouth daily as needed for allergies.    Yes [provider]  MAGNESIUM-OXIDE 400 (240 Mg) MG tablet Take 1 tablet by mouth 2 (two) times daily. 05/01/21  Yes [provider]  amoxicillin (AMOXIL) 500 MG capsule Take 1 capsule (500 mg total) by mouth 2 (two) times daily. 01/23/22   Jaynee Eagles, PA-C  lidocaine (XYLOCAINE) 2 % solution Use as directed 10 mLs in the mouth or throat as needed for mouth pain. 10/27/21   Volney American, PA-C  lidocaine-prilocaine (EMLA) cream Apply 1 application topically See admin instructions. Apply small amount to port a cath and cover with plastic wrap 1 hour prior to chemotherapy 02/03/21   Derek Jack, MD  MAGNESIUM-OXIDE 400 (240 Mg) MG tablet TAKE 1 TABLET(400 MG) BY MOUTH TWICE DAILY 12/14/21   Derek Jack, MD  naproxen (  NAPROSYN) 500 MG tablet Take 1 tablet (500 mg total) by mouth 2 (two) times daily with a meal. 01/23/22   Jaynee Eagles, PA-C  potassium chloride (KLOR-CON) 10 MEQ tablet TAKE 2 TABLETS(20 MEQ) BY MOUTH TWICE DAILY 09/07/20   Derek Jack, MD     Vital Signs: BP 120/82   Pulse 62   Temp 97.9 F (36.6 C) (Oral)   Resp 16   Ht _0  (1.448 m)   Wt 165 lb (74.8 kg)   LMP 09/29/2019   SpO2 99%   BMI 35.71 kg/m   Physical Exam  awake, alert.  Chest clear to auscultation bilaterally.  Clean, intact right chest wall Port-A-Cath.  Heart with regular rate and rhythm.  Abdomen soft, positive bowel sounds, nontender.  No lower extremity edema.  Imaging: No results found.  Labs:  CBC: Recent Labs    05/19/21 1538 10/26/21 1408 03/01/22 1438  WBC 8.4 7.7 7.0  HGB 13.9 13.9 13.8  HCT 41.3 41.9 41.8  PLT 309 324 308    COAGS: No results for input(s): INR, APTT in the last 8760 hours.  BMP: Recent Labs    05/19/21 1538 10/26/21 1408 03/01/22 1438  NA 138 135 139  K 3.6 3.4* 3.5  CL 105 103 108  CO2 _1 GLUCOSE 82 106* 127*  BUN _2 CALCIUM 9.9 10.1 9.8  CREATININE 0.56 0.55 0.67  GFRNONAA >60 >60 >60    LIVER FUNCTION TESTS: Recent Labs    05/19/21 1538 10/26/21 1408 03/01/22 1438  BILITOT 0.6 0.6 0.7  AST 24 36 28  ALT 36 52* 40  ALKPHOS 93 92 94  PROT 7.8 8.0 7.9  ALBUMIN 4.2 4.3 4.0    Assessment and Plan: Patient known to IR service from Port-A-Cath placement on 08/26/2019.  She has a history of right breast carcinoma in 2020, status post bilateral mastectomies and chemotherapy.  She has now completed treatment and is no longer using her Port-A-Cath.  She presents today for Port-A-Cath removal.  Details/risks of procedure, including but not limited to, internal bleeding, infection, injury to adjacent structures discussed with patient with her understanding and consent.   Electronically Signed: D. Rowe Robert, PA-C 04/25/2022, 10:15 AM   I spent a total of 20 minutes at the the patient's bedside AND on the patient's hospital floor or unit, greater than 50% of which was counseling/coordinating care for Port-A-Cath removal

## 2022-04-30 ENCOUNTER — Other Ambulatory Visit: Payer: Self-pay | Admitting: Nurse Practitioner

## 2022-08-30 ENCOUNTER — Inpatient Hospital Stay: Payer: Medicaid Other | Attending: Hematology

## 2022-08-30 DIAGNOSIS — R7401 Elevation of levels of liver transaminase levels: Secondary | ICD-10-CM | POA: Diagnosis not present

## 2022-08-30 DIAGNOSIS — Z853 Personal history of malignant neoplasm of breast: Secondary | ICD-10-CM | POA: Insufficient documentation

## 2022-08-30 DIAGNOSIS — E559 Vitamin D deficiency, unspecified: Secondary | ICD-10-CM | POA: Insufficient documentation

## 2022-08-30 DIAGNOSIS — Z170 Estrogen receptor positive status [ER+]: Secondary | ICD-10-CM

## 2022-08-30 DIAGNOSIS — Z9013 Acquired absence of bilateral breasts and nipples: Secondary | ICD-10-CM | POA: Insufficient documentation

## 2022-08-30 DIAGNOSIS — Z79811 Long term (current) use of aromatase inhibitors: Secondary | ICD-10-CM | POA: Insufficient documentation

## 2022-08-30 LAB — CBC WITH DIFFERENTIAL/PLATELET
Abs Immature Granulocytes: 0.02 10*3/uL (ref 0.00–0.07)
Basophils Absolute: 0 10*3/uL (ref 0.0–0.1)
Basophils Relative: 0 %
Eosinophils Absolute: 0.1 10*3/uL (ref 0.0–0.5)
Eosinophils Relative: 1 %
HCT: 41.1 % (ref 36.0–46.0)
Hemoglobin: 13.5 g/dL (ref 12.0–15.0)
Immature Granulocytes: 0 %
Lymphocytes Relative: 43 %
Lymphs Abs: 3 10*3/uL (ref 0.7–4.0)
MCH: 28.9 pg (ref 26.0–34.0)
MCHC: 32.8 g/dL (ref 30.0–36.0)
MCV: 88 fL (ref 80.0–100.0)
Monocytes Absolute: 0.4 10*3/uL (ref 0.1–1.0)
Monocytes Relative: 5 %
Neutro Abs: 3.5 10*3/uL (ref 1.7–7.7)
Neutrophils Relative %: 51 %
Platelets: 316 10*3/uL (ref 150–400)
RBC: 4.67 MIL/uL (ref 3.87–5.11)
RDW: 12.5 % (ref 11.5–15.5)
WBC: 7 10*3/uL (ref 4.0–10.5)
nRBC: 0 % (ref 0.0–0.2)

## 2022-08-30 LAB — COMPREHENSIVE METABOLIC PANEL
ALT: 50 U/L — ABNORMAL HIGH (ref 0–44)
AST: 36 U/L (ref 15–41)
Albumin: 4.1 g/dL (ref 3.5–5.0)
Alkaline Phosphatase: 84 U/L (ref 38–126)
Anion gap: 8 (ref 5–15)
BUN: 17 mg/dL (ref 6–20)
CO2: 26 mmol/L (ref 22–32)
Calcium: 9.9 mg/dL (ref 8.9–10.3)
Chloride: 105 mmol/L (ref 98–111)
Creatinine, Ser: 0.73 mg/dL (ref 0.44–1.00)
GFR, Estimated: >60 mL/min (ref >60)
Glucose, Bld: 107 mg/dL — ABNORMAL HIGH (ref 70–99)
Potassium: 3.5 mmol/L (ref 3.5–5.1)
Sodium: 139 mmol/L (ref 135–145)
Total Bilirubin: 1 mg/dL (ref 0.3–1.2)
Total Protein: 7.8 g/dL (ref 6.5–8.1)

## 2022-08-30 LAB — VITAMIN D 25 HYDROXY (VIT D DEFICIENCY, FRACTURES): Vit D, 25-Hydroxy: 34.7 ng/mL (ref 30–100)

## 2022-09-01 LAB — CANCER ANTIGEN 15-3: CA 15-3: 18.8 U/mL (ref 0.0–25.0)

## 2022-09-06 ENCOUNTER — Inpatient Hospital Stay (HOSPITAL_BASED_OUTPATIENT_CLINIC_OR_DEPARTMENT_OTHER): Payer: Medicaid Other | Admitting: Hematology

## 2022-09-06 DIAGNOSIS — Z853 Personal history of malignant neoplasm of breast: Secondary | ICD-10-CM | POA: Diagnosis not present

## 2022-09-06 DIAGNOSIS — C50411 Malignant neoplasm of upper-outer quadrant of right female breast: Secondary | ICD-10-CM

## 2022-09-06 DIAGNOSIS — Z79811 Long term (current) use of aromatase inhibitors: Secondary | ICD-10-CM | POA: Diagnosis not present

## 2022-09-06 DIAGNOSIS — Z170 Estrogen receptor positive status [ER+]: Secondary | ICD-10-CM

## 2022-09-06 NOTE — Progress Notes (Signed)
Monica Hancock 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 [facility])   08/27/2019 - 12/12/2019 Chemotherapy   The patient had palonosetron (ALOXI) injection 0.25 mg, 0.25 mg, Intravenous,  Once, 6 of 6 cycles Administration: 0.25 mg (08/27/2019), 0.25 mg (09/17/2019), 0.25 mg (11/19/2019), 0.25 mg (12/10/2019), 0.25 mg (10/08/2019), 0.25 mg (10/29/2019) pegfilgrastim-jmdb (FULPHILA) injection 6 mg, 6 mg, Subcutaneous,  Once, 6 of 6 cycles Administration: 6 mg (08/29/2019), 6 mg (09/19/2019), 6 mg (11/21/2019), 6 mg (12/12/2019), 6 mg (10/10/2019), 6 mg (10/31/2019) trastuzumab (HERCEPTIN) 600 mg in sodium chloride 0.9 % 250 mL chemo infusion, 567 mg (100 % of original dose 8 mg/kg), Intravenous,  Once, 1 of 1 cycle Dose modification: 8 mg/kg (original dose 8 mg/kg, Cycle 1, Reason: Other (see comments), Comment: using up last of our Herceptin at [facility]) Administration: 600 mg (08/27/2019) CARBOplatin (PARAPLATIN) 780 mg in sodium chloride 0.9 % 250 mL chemo infusion, 780 mg (100 % of original dose 784.8 mg), Intravenous,  Once, 6 of 6 cycles Dose modification:   (original dose 784.8 mg, Cycle 1),   (original dose 784.8 mg, Cycle 2),   (original dose 784.8 mg, Cycle 5), 784.8 mg (original dose 784.8 mg, Cycle 5, Reason: Other (see comments), Comment: scr 0.8 entered),   (original dose 784.8 mg, Cycle 6),   (original dose 784.8 mg, Cycle 3),   (original dose 784.8 mg, Cycle 4) Administration: 780 mg (08/27/2019), 780 mg (09/17/2019), 780 mg (11/19/2019), 780 mg (12/10/2019), 780 mg (10/08/2019), 780 mg (10/29/2019) DOCEtaxel (TAXOTERE) 130 mg in  sodium chloride 0.9 % 250 mL chemo infusion, 75 mg/m2 = 130 mg, Intravenous,  Once, 6 of 6 cycles Administration: 130 mg (08/27/2019), 130 mg (09/17/2019), 130 mg (11/19/2019), 130 mg (12/10/2019), 130 mg (10/08/2019), 130 mg (10/29/2019) pertuzumab (PERJETA) 840 mg in sodium chloride 0.9 % 250 mL chemo infusion, 840 mg, Intravenous, Once, 6 of 6 cycles Administration: 840 mg (08/27/2019), 420 mg (09/17/2019), 420 mg (11/19/2019), 420 mg (12/10/2019), 420 mg (10/08/2019), 420 mg (10/29/2019) fosaprepitant (EMEND) 150 mg, dexamethasone (DECADRON) 12 mg in sodium chloride 0.9 % 145 mL IVPB, , Intravenous,  Once, 6 of 6 cycles Administration:  (08/27/2019),  (09/17/2019),  (11/19/2019),  (12/10/2019),  (10/08/2019),  (10/29/2019) trastuzumab-dkst (OGIVRI) 450 mg in sodium chloride 0.9 % 250 mL chemo infusion, 420 mg, Intravenous,  Once, 5 of 5 cycles Administration: 450 mg (09/17/2019), 450 mg (11/19/2019), 420 mg (12/10/2019), 450 mg (10/08/2019), 450 mg (10/29/2019)  for chemotherapy treatment.    01/02/2020 - 10/06/2020 Chemotherapy   Patient is on Treatment Plan : BREAST Trastuzumab / Pertuzumab (Perjeta) q21d       CHIEF COMPLIANT: Follow-up for right breast cancer   INTERVAL HISTORY: Monica Hancock is a 41 y.o. female seen for follow-up of right breast cancer.  She is tolerating anastrozole reasonably well.  He has mild hot flashes which are stable.  Energy levels are 100%.  No new onset pains reported.  REVIEW OF SYSTEMS:   Review of Systems  Constitutional:  Negative for appetite change and fatigue.  Endocrine: Positive for hot flashes (stable).  All other systems reviewed and are negative.   I have reviewed the past  medical history, past surgical history, social history and family history with the patient and they are unchanged from previous note.   ALLERGIES:   has No Known Allergies.   MEDICATIONS:  Current Outpatient Medications  Medication Sig Dispense Refill   amoxicillin  (AMOXIL) 500 MG capsule Take 1 capsule (500 mg total) by mouth 2 (two) times daily. 20 capsule 0   anastrozole (ARIMIDEX) 1 MG tablet TAKE 1 TABLET(1 MG) BY MOUTH DAILY 30 tablet 6   Calcium Carbonate-Vit D-Min (CALCIUM 1200 PO) Take by mouth.     diphenhydrAMINE (BENADRYL) 50 MG tablet Take 50 mg by mouth every 8 (eight) hours as needed for allergies.     ibuprofen (ADVIL) 600 MG tablet Take 1 tablet (600 mg total) by mouth every 6 (six) hours as needed for fever or headache. 30 tablet 0   lidocaine (XYLOCAINE) 2 % solution Use as directed 10 mLs in the mouth or throat as needed for mouth pain. 100 mL 0   lidocaine-prilocaine (EMLA) cream Apply 1 application topically See admin instructions. Apply small amount to port a cath and cover with plastic wrap 1 hour prior to chemotherapy 30 g 1   loratadine (CLARITIN) 10 MG tablet Take 10 mg by mouth daily as needed for allergies.      MAGNESIUM-OXIDE 400 (240 Mg) MG tablet Take 1 tablet by mouth 2 (two) times daily.     MAGNESIUM-OXIDE 400 (240 Mg) MG tablet TAKE 1 TABLET(400 MG) BY MOUTH TWICE DAILY 60 tablet 6   naproxen (NAPROSYN) 500 MG tablet Take 1 tablet (500 mg total) by mouth 2 (two) times daily with a meal. 30 tablet 0   potassium chloride (KLOR-CON) 10 MEQ tablet TAKE 2 TABLETS(20 MEQ) BY MOUTH TWICE DAILY 120 tablet 3   No current facility-administered medications for this visit.     PHYSICAL EXAMINATION: Performance status (ECOG): 0 - Asymptomatic  There were no vitals filed for this visit.  Wt Readings from Last 3 Encounters:  04/25/22 165 lb (74.8 kg)  03/08/22 166 lb 1.6 oz (75.3 kg)  11/02/21 166 lb 1.6 oz (75.3 kg)   Physical Exam Vitals reviewed.  Constitutional:      Appearance: Normal appearance. She is obese.  Cardiovascular:     Rate and Rhythm: Normal rate and regular rhythm.     Pulses: Normal pulses.     Heart sounds: Normal heart sounds.  Pulmonary:     Effort: Pulmonary effort is normal.     Breath sounds:  Normal breath sounds.  Chest:  Breasts:    Right: No swelling, bleeding, mass, skin change (mastectomy site WNL) or tenderness.     Left: No swelling, bleeding, mass, skin change (mastectomy site WNL) or tenderness.     Comments: R chest wall port-a-cath Lymphadenopathy:     Upper Body:     Right upper body: No supraclavicular or axillary adenopathy.     Left upper body: No supraclavicular or axillary adenopathy.  Neurological:     General: No focal deficit present.     Mental Status: She is alert and oriented to person, place, and time.  Psychiatric:        Mood and Affect: Mood normal.        Behavior: Behavior normal.     Breast Exam Chaperone: Thana Ates     LABORATORY DATA:  I have reviewed the data as listed    Latest Ref Rng & Units 08/30/2022    2:48 PM 03/01/2022    2:38  PM 10/26/2021    2:08 PM  CMP  Glucose 70 - 99 mg/dL 107  127  106   BUN 6 - 20 mg/dL _0 Creatinine 0.44 - 1.00 mg/dL 0.73  0.67  0.55   Sodium 135 - 145 mmol/L 139  139  135   Potassium 3.5 - 5.1 mmol/L 3.5  3.5  3.4   Chloride 98 - 111 mmol/L 105  108  103   CO2 22 - 32 mmol/L _1 Calcium 8.9 - 10.3 mg/dL 9.9  9.8  10.1   Total Protein 6.5 - 8.1 g/dL 7.8  7.9  8.0   Total Bilirubin 0.3 - 1.2 mg/dL 1.0  0.7  0.6   Alkaline Phos 38 - 126 U/L 84  94  92   AST 15 - 41 U/L 36  28  36   ALT 0 - 44 U/L 50  40  52    Lab Results  Component Value Date   CAN153 18.8 08/30/2022   CAN153 19.7 03/01/2022   CAN153 20.5 10/26/2021   Lab Results  Component Value Date   WBC 7.0 08/30/2022   HGB 13.5 08/30/2022   HCT 41.1 08/30/2022   MCV 88.0 08/30/2022   PLT 316 08/30/2022   NEUTROABS 3.5 08/30/2022    ASSESSMENT:  1.  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 -Herceptin and Pertuzumab maintenance started on 01/02/2020. -Bilateral mastectomies on 01/24/2020.  Pathology showed YPT0Y PN  0, 0/6 lymph nodes involved. -She met with Dr. Sondra Come who did not recommend adjuvant radiation. -TAH/BSO on 04/14/2020. -Anastrozole to start on 04/24/2020.   2.  BRCA1 positive: -Bilateral mastectomies on 01/24/2020. -TAH/BSO on 04/14/2020.   PLAN:  1.  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 - Bilateral mastectomy sites are within normal limits.  No palpable adenopathy. - Reviewed labs today which showed mildly elevated ALT of 50.  This was previously high and returned to normal.  CBC was grossly normal.  CA 15-3 was normal. - Recommend follow-up in 6 months with repeat labs and exam.   2.  Bone health: - Vitamin D has improved to 34. - Continue calcium and vitamin D supplements. - We will plan to check bone density test prior to next visit.  Breast Cancer therapy associated bone loss: I have recommended calcium, Vitamin D and weight bearing exercises.  Orders placed this encounter:  Orders Placed This Encounter  Procedures   DG Bone Density   CBC with Differential/Platelet   Comprehensive metabolic panel   Magnesium   VITAMIN D 25 Hydroxy (Vit-D Deficiency, Fractures)   Cancer antigen 15-3    The patient has a good understanding of the overall plan. She agrees with it. She will call with any problems that may develop before the next visit here.  Derek Jack, MD [place] [phone]

## 2022-09-06 NOTE — Patient Instructions (Signed)
[facility] [place]  Discharge Instructions  You were seen and examined today by Dr. [name].  Dr. [name] discussed your most recent lab work which revealed that everything looks good.  Follow-up as scheduled in 6 months.    Thank you for choosing [facility] to provide your oncology and hematology care.   To afford each patient quality time with our provider, please arrive at least 15 minutes before your scheduled appointment time. You may need to reschedule your appointment if you arrive late (10 or more minutes). Arriving late affects you and other patients whose appointments are after yours.  Also, if you miss three or more appointments without notifying the office, you may be dismissed from the clinic at the provider's discretion.    Again, thank you for choosing [facility].  Our hope is that these requests will decrease the amount of time that you wait before being seen by our physicians.   If you have a lab appointment with the Cancer Center please come in thru the Main Entrance and check in at the main information desk.           _____________________________________________________________  Should you have questions after your visit to [facility], please contact our office at [phone] and follow the prompts.  Our office hours are 8:00 a.m. to 4:30 p.m. Monday - Thursday and 8:00 a.m. to 2:30 p.m. Friday.  Please note that voicemails left after 4:00 p.m. may not be returned until the following business day.  We are closed weekends and all major holidays.  You do have access to a nurse 24-7, just call the main number to the clinic [phone] and do not press any options, hold on the line and a nurse will answer the phone.    For prescription refill requests, have your pharmacy contact our office and allow 72 hours.    Masks are optional in the cancer centers. If you would like for your care team  to wear a mask while they are taking care of you, please let them know. You may have one support person who is at least 43 years old accompany you for your appointments.  

## 2022-09-26 ENCOUNTER — Ambulatory Visit
Admission: EM | Admit: 2022-09-26 | Discharge: 2022-09-26 | Disposition: A | Discharge status: Home / Self Care | Payer: Medicaid Other | Attending: Nurse Practitioner | Admitting: Nurse Practitioner

## 2022-09-26 DIAGNOSIS — J029 Acute pharyngitis, unspecified: Secondary | ICD-10-CM

## 2022-09-26 DIAGNOSIS — R52 Pain, unspecified: Secondary | ICD-10-CM | POA: Insufficient documentation

## 2022-09-26 DIAGNOSIS — R5383 Other fatigue: Secondary | ICD-10-CM | POA: Insufficient documentation

## 2022-09-26 DIAGNOSIS — J039 Acute tonsillitis, unspecified: Secondary | ICD-10-CM | POA: Diagnosis present

## 2022-09-26 DIAGNOSIS — Z1152 Encounter for screening for COVID-19: Secondary | ICD-10-CM | POA: Insufficient documentation

## 2022-09-26 LAB — RESP PANEL BY RT-PCR (FLU A&B, COVID) ARPGX2
Influenza A by PCR: NEGATIVE
Influenza B by PCR: NEGATIVE
SARS Coronavirus 2 by RT PCR: NEGATIVE

## 2022-09-26 LAB — POCT RAPID STREP A (OFFICE): Rapid Strep A Screen: NEGATIVE

## 2022-09-26 MED ORDER — AMOXICILLIN 500 MG PO CAPS: 500.0000 mg | ORAL_CAPSULE | Freq: Two times a day (BID) | ORAL | 0 refills | Status: AC

## 2022-09-26 NOTE — ED Triage Notes (Signed)
Started having chills, body aches and sore throat yesterday. No fever at home. Took ibuprofen at home yesterday.

## 2022-09-26 NOTE — ED Provider Notes (Signed)
[facility]    CSN: [id number] Arrival date & time: 09/26/22  1546      History   Chief Complaint Chief Complaint  Patient presents with   Sore Throat   Chills   Generalized Body Aches    HPI Monica Hancock is a 41 y.o. female.   Patient presents for 1 day of tactile fevers, body aches, chills, some sneezing, tonsil pain, pain with swallowing, and fatigue.  She denies cough, shortness of breath or chest pain, chest/nasal congestion, runny nose, postnasal drainage, headache, ear pain, abdominal pain, nausea/vomiting, diarrhea, loss of taste or smell, or new rash.  No known sick contacts.  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  Has not take anything for symptoms so far.     Past Medical History:  Diagnosis Date   BRCA1 positive    Cancer ([place])    Right Breast   Family history of adverse reaction to anesthesia    PONV-children   Family history of BRCA1 gene positive    Family history of breast cancer    H/O Bell's palsy 2000   Left sided   HSV infection     Patient Active Problem List   Diagnosis Date Noted   Long term current use of aromatase inhibitor 06/24/2021   Hot flashes due to surgical menopause 06/24/2021   Encounter to establish care 02/25/2021   Hot flashes 02/25/2021   RBBB 02/25/2021   Shingles 02/25/2021   Status post abdominal hysterectomy 04/14/2020   S/P bilateral mastectomy 01/24/2020   Elevated alkaline phosphatase level 11/14/2019   Malignant neoplasm of upper-outer quadrant of right female breast ([place]) 08/07/2019   Breast lump on right side at 10 o'clock position 08/01/2019   Breast lump on left side at 3 o'clock position 08/01/2019   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   Family history of BRCA1 gene positive     Past Surgical History:  Procedure Laterality Date   ABDOMINAL HYSTERECTOMY N/A    [employer] 02/25/2021   BREAST BIOPSY Right    BREAST SURGERY N/A    [employer] 02/25/2021   IR  IMAGING GUIDED PORT INSERTION  08/26/2019   Right   IR REMOVAL TUN ACCESS W/ PORT W/O FL MOD SED  04/25/2022   MASTECTOMY MODIFIED RADICAL Right 01/24/2020   Procedure: RIGHT MASTECTOMY MODIFIED RADICAL;  Surgeon: Aviva Signs, MD;  Location: [facility];  Service: General;  Laterality: Right;   SALPINGOOPHORECTOMY Bilateral 04/14/2020   Procedure: OPEN BILATERAL SALPINGO OOPHORECTOMY;  Surgeon: [name], MD;  Location: [facility];  Service: Gynecology;  Laterality: Bilateral;   SIMPLE MASTECTOMY WITH AXILLARY SENTINEL NODE BIOPSY Left 01/24/2020   Procedure: LEFT SIMPLE MASTECTOMY;  Surgeon: Aviva Signs, MD;  Location: [facility];  Service: General;  Laterality: Left;   SUPRACERVICAL ABDOMINAL HYSTERECTOMY N/A 04/14/2020   Procedure: HYSTERECTOMY SUPRACERVICAL ABDOMINAL;  Surgeon: [name], MD;  Location: [facility];  Service: Gynecology;  Laterality: N/A;   TUBAL LIGATION      OB History     Gravida  5   Para  5   Term  4   Preterm  1   AB  0   Living  5      SAB  0   IAB  0   Ectopic  0   Multiple  0   Live Births  5            Home Medications    Prior to Admission medications   Medication Sig  Start Date End Date Taking? Authorizing Provider  amoxicillin (AMOXIL) 500 MG capsule Take 1 capsule (500 mg total) by mouth 2 (two) times daily for 10 days. 09/26/22 10/06/22 Yes Rohlman, Temisha A, NP  anastrozole (ARIMIDEX) 1 MG tablet TAKE 1 TABLET(1 MG) BY MOUTH DAILY 11/02/21  Yes [name], [name], MD  Calcium Carbonate-Vit D-Min (CALCIUM 1200 PO) Take by mouth.   Yes [provider]  diphenhydrAMINE (BENADRYL) 50 MG tablet Take 50 mg by mouth every 8 (eight) hours as needed for allergies.   Yes [provider]  ibuprofen (ADVIL) 600 MG tablet Take 1 tablet (600 mg total) by mouth every 6 (six) hours as needed for fever or headache. 04/16/20  Yes Ferguson, John V, MD  loratadine (CLARITIN) 10 MG tablet Take 10 mg by mouth daily as needed for  allergies.    Yes [provider]    Family History Family History  Problem Relation Age of Onset   Diabetes Mother    Breast cancer Mother 39   Cancer Mother    Diabetes Maternal Grandmother    Stomach cancer Maternal Grandfather    Breast cancer Maternal Aunt        dx in her 60s   Diabetes Maternal Aunt     Social History Social History   Tobacco Use   Smoking status: Never   Smokeless tobacco: Never  Vaping Use   Vaping Use: Never used  Substance Use Topics   Alcohol use: No   Drug use: No     Allergies   Patient has no known allergies.   Review of Systems Review of Systems Per HPI  Physical Exam Triage Vital Signs ED Triage Vitals  Enc Vitals Group     BP 09/26/22 1620 134/84     Pulse Rate 09/26/22 1620 96     Resp 09/26/22 1620 18     Temp 09/26/22 1620 98.9 F (37.2 C)     Temp Source 09/26/22 1620 Oral     SpO2 09/26/22 1620 99 %     Weight --      Height --      Head Circumference --      Peak Flow --      Pain Score 09/26/22 1621 7     Pain Loc --      Pain Edu? --      Excl. in GC? --    No data found.  Updated Vital Signs BP 134/84 (BP Location: Right Arm)   Pulse 96   Temp 98.9 F (37.2 C) (Oral)   Resp 18   LMP 09/29/2019   SpO2 99%   Visual Acuity Right Eye Distance:   Left Eye Distance:   Bilateral Distance:    Right Eye Near:   Left Eye Near:    Bilateral Near:     Physical Exam Vitals and nursing note reviewed.  Constitutional:      General: She is not in acute distress.    Appearance: She is not toxic-appearing.  HENT:     Head: Normocephalic and atraumatic.     Right Ear: Tympanic membrane and ear canal normal. No drainage, swelling or tenderness. No middle ear effusion. Tympanic membrane is not erythematous.     Left Ear: Tympanic membrane and ear canal normal. No drainage, swelling or tenderness.  No middle ear effusion. Tympanic membrane is not erythematous.     Nose: No congestion or rhinorrhea.      Mouth/Throat:     Mouth: Mucous   membranes are moist.     Pharynx: Oropharynx is clear. Posterior oropharyngeal erythema present. No oropharyngeal exudate or uvula swelling.     Tonsils: No tonsillar exudate. 3+ on the right. 3+ on the left.  Cardiovascular:     Rate and Rhythm: Normal rate and regular rhythm.  Pulmonary:     Effort: Pulmonary effort is normal. No respiratory distress.     Breath sounds: Normal breath sounds. No wheezing, rhonchi or rales.  Abdominal:     General: There is no distension.     Palpations: Abdomen is soft.     Tenderness: There is no abdominal tenderness.  Musculoskeletal:     Cervical back: Normal range of motion.  Lymphadenopathy:     Cervical: No cervical adenopathy.  Skin:    General: Skin is warm and dry.     Capillary Refill: Capillary refill takes less than 2 seconds.     Coloration: Skin is not pale.     Findings: No erythema or rash.  Neurological:     Mental Status: She is alert and oriented to person, place, and time.  Psychiatric:        Behavior: Behavior is cooperative.      UC Treatments / Results  Labs (all labs ordered are listed, but only abnormal results are displayed) Labs Reviewed  CULTURE, GROUP A STREP ([facility])  RESP PANEL BY RT-PCR (FLU A&B, COVID) ARPGX2  POCT RAPID STREP A (OFFICE)    EKG   Radiology No results found.  Procedures Procedures (including critical care time)  Medications Ordered in UC Medications - No data to display  Initial Impression / Assessment and Plan / UC Course  I have reviewed the triage vital signs and the nursing notes.  Pertinent labs & imaging results that were available during my care of the patient were reviewed by me and considered in my medical decision making (see chart for details).   Patient is well-appearing, normotensive, afebrile, not tachycardic, not tachypneic, oxygenating well on room air.    Acute tonsillitis, unspecified etiology Body aches Acute  pharyngitis, unspecified etiology Rapid strep throat test negative, throat culture pending COVID-19, influenza testing obtained.  Would recommend treating the Paxlovid if COVID-19 positive.  Last GFR greater than 60 last month.  She would also be a good candidate for Tamiflu if she is positive. Supportive care discussed Note given for work ER and return precautions discussed  The patient was given the opportunity to ask questions.  All questions answered to their satisfaction.  The patient is in agreement to this plan.    Final Clinical Impressions(s) / UC Diagnoses   Final diagnoses:  Acute tonsillitis, unspecified etiology  Body aches  Acute pharyngitis, unspecified etiology     Discharge Instructions      Rapid strep throat test is negative today.  Throat culture is pending and we will call you in a couple of days if this comes up positive.  I am suspicious that you have strep throat based on your symptoms and examination.  Please start the amoxicillin and take it twice daily for 10 days.  Please change her toothbrush after starting treatment.  The sore throat may also be from a viral infection-we have tested you for COVID-19 and influenza and we will call you if this is positive tomorrow.  We will prescribe Tamiflu or Paxlovid if you test positive for either influenza or COVID-19.  Some things that can make you feel better are: - Increased rest - Increasing fluid with   water/sugar free electrolytes - Acetaminophen and ibuprofen as needed for fever/pain - Salt water gargling, chloraseptic spray and throat lozenges - OTC guaifenesin (Mucinex) 600 mg twice daily - Saline sinus flushes or a neti pot - Humidifying the air     ED Prescriptions     Medication Sig Dispense Auth. Provider   amoxicillin (AMOXIL) 500 MG capsule Take 1 capsule (500 mg total) by mouth 2 (two) times daily for 10 days. 20 capsule Eulogio Bear, NP      PDMP not reviewed this encounter.    Eulogio Bear, NP 09/26/22 1652

## 2022-09-26 NOTE — Discharge Instructions (Addendum)
Rapid strep throat test is negative today.  Throat culture is pending and we will call you in a couple of days if this comes up positive.  I am suspicious that you have strep throat based on your symptoms and examination.  Please start the amoxicillin and take it twice daily for 10 days.  Please change her toothbrush after starting treatment.  The sore throat may also be from a viral infection-we have tested you for COVID-19 and influenza and we will call you if this is positive tomorrow.  We will prescribe Tamiflu or Paxlovid if you test positive for either influenza or COVID-19.  Some things that can make you feel better are: - Increased rest - Increasing fluid with water/sugar free electrolytes - Acetaminophen and ibuprofen as needed for fever/pain - Salt water gargling, chloraseptic spray and throat lozenges - OTC guaifenesin (Mucinex) 600 mg twice daily - Saline sinus flushes or a neti pot - Humidifying the air

## 2022-09-29 LAB — CULTURE, GROUP A STREP (THRC)

## 2022-10-22 ENCOUNTER — Other Ambulatory Visit: Payer: Self-pay

## 2022-10-22 ENCOUNTER — Encounter: Payer: Self-pay | Admitting: Emergency Medicine

## 2022-10-22 ENCOUNTER — Ambulatory Visit
Admission: EM | Admit: 2022-10-22 | Discharge: 2022-10-22 | Disposition: A | Discharge status: Home / Self Care | Payer: Medicaid Other | Attending: Family Medicine | Admitting: Family Medicine

## 2022-10-22 DIAGNOSIS — J0100 Acute maxillary sinusitis, unspecified: Secondary | ICD-10-CM

## 2022-10-22 DIAGNOSIS — R051 Acute cough: Secondary | ICD-10-CM | POA: Diagnosis not present

## 2022-10-22 MED ORDER — PROMETHAZINE-DM 6.25-15 MG/5ML PO SYRP: 5.0000 mL | ORAL_SOLUTION | Freq: Four times a day (QID) | ORAL | 0 refills | Status: DC | PRN

## 2022-10-22 MED ORDER — AMOXICILLIN-POT CLAVULANATE 875-125 MG PO TABS: 1.0000 | ORAL_TABLET | Freq: Two times a day (BID) | ORAL | 0 refills | Status: DC

## 2022-10-22 MED ORDER — FLUTICASONE PROPIONATE 50 MCG/ACT NA SUSP: 1.0000 | Freq: Two times a day (BID) | NASAL | 2 refills | Status: AC

## 2022-10-22 NOTE — ED Triage Notes (Signed)
Pt reports cough x1 weeks and right sided facial pain for last few days.

## 2022-10-22 NOTE — ED Provider Notes (Signed)
[facility]    CSN: [id number] Arrival date & time: 10/22/22  1138      History   Chief Complaint Chief Complaint  Patient presents with   Cough    HPI RAVONDA BRECHEEN is a 41 y.o. female.   Presenting today with 1 week history of progressively worsening hacking productive cough, nasal congestion and now right-sided facial pain and pressure the past few days.  Denies fever, chills, body aches, chest pain, shortness of breath.  So far trying Tylenol with minimal relief.  No known sick contacts recently.    Past Medical History:  Diagnosis Date   BRCA1 positive    Cancer ([place])    Right Breast   Family history of adverse reaction to anesthesia    PONV-children   Family history of BRCA1 gene positive    Family history of breast cancer    H/O Bell's palsy 2000   Left sided   HSV infection     Patient Active Problem List   Diagnosis Date Noted   Long term current use of aromatase inhibitor 06/24/2021   Hot flashes due to surgical menopause 06/24/2021   Encounter to establish care 02/25/2021   Hot flashes 02/25/2021   RBBB 02/25/2021   Shingles 02/25/2021   Status post abdominal hysterectomy 04/14/2020   S/P bilateral mastectomy 01/24/2020   Elevated alkaline phosphatase level 11/14/2019   Malignant neoplasm of upper-outer quadrant of right female breast ([place]) 08/07/2019   Breast lump on right side at 10 o'clock position 08/01/2019   Breast lump on left side at 3 o'clock position 08/01/2019   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   Family history of BRCA1 gene positive     Past Surgical History:  Procedure Laterality Date   ABDOMINAL HYSTERECTOMY N/A    [employer]rees[employer] 02/25/2021   BREAST BIOPSY Right    BREAST SURGERY N/A    [employer]rees[employer] 02/25/2021   IR IMAGING GUIDED PORT INSERTION  08/26/2019   Right   IR REMOVAL TUN ACCESS W/ PORT W/O FL MOD SED  04/25/2022   MASTECTOMY MODIFIED RADICAL Right 01/24/2020   Procedure: RIGHT  MASTECTOMY MODIFIED RADICAL;  Surgeon: Aviva Signs, MD;  Location: [facility];  Service: General;  Laterality: Right;   SALPINGOOPHORECTOMY Bilateral 04/14/2020   Procedure: OPEN BILATERAL SALPINGO OOPHORECTOMY;  Surgeon: [name], MD;  Location: [facility];  Service: Gynecology;  Laterality: Bilateral;   SIMPLE MASTECTOMY WITH AXILLARY SENTINEL NODE BIOPSY Left 01/24/2020   Procedure: LEFT SIMPLE MASTECTOMY;  Surgeon: Aviva Signs, MD;  Location: [facility];  Service: General;  Laterality: Left;   SUPRACERVICAL ABDOMINAL HYSTERECTOMY N/A 04/14/2020   Procedure: HYSTERECTOMY SUPRACERVICAL ABDOMINAL;  Surgeon: [name], MD;  Location: [facility];  Service: Gynecology;  Laterality: N/A;   TUBAL LIGATION      OB History     Gravida  5   Para  5   Term  4   Preterm  1   AB  0   Living  5      SAB  0   IAB  0   Ectopic  0   Multiple  0   Live Births  5            Home Medications    Prior to Admission medications   Medication Sig Start Date End Date Taking? Authorizing Provider  amoxicillin-clavulanate (AUGMENTIN) 875-125 MG tablet Take 1 tablet by mouth every 12 (twelve) hours. 10/22/22  Yes Volney American, PA-C  fluticasone [facility])  50 MCG/ACT nasal spray Place 1 spray into both nostrils 2 (two) times daily. 10/22/22  Yes Volney American, PA-C  promethazine-dextromethorphan (PROMETHAZINE-DM) 6.25-15 MG/5ML syrup Take 5 mLs by mouth 4 (four) times daily as needed. 10/22/22  Yes Volney American, PA-C  anastrozole (ARIMIDEX) 1 MG tablet TAKE 1 TABLET(1 MG) BY MOUTH DAILY 11/02/21   Derek Jack, MD  Calcium Carbonate-Vit D-Min (CALCIUM 1200 PO) Take by mouth.    [provider]  diphenhydrAMINE (BENADRYL) 50 MG tablet Take 50 mg by mouth every 8 (eight) hours as needed for allergies.    [provider]  ibuprofen (ADVIL) 600 MG tablet Take 1 tablet (600 mg total) by mouth every 6 (six) hours as needed for fever or headache.  04/16/20   [name], MD  loratadine (CLARITIN) 10 MG tablet Take 10 mg by mouth daily as needed for allergies.     [provider]    Family History Family History  Problem Relation Age of Onset   Diabetes Mother    Breast cancer Mother 75   Cancer Mother    Diabetes Maternal Grandmother    Stomach cancer Maternal Grandfather    Breast cancer Maternal Aunt        dx in her 82s   Diabetes Maternal Aunt     Social History Social History   Tobacco Use   Smoking status: Never   Smokeless tobacco: Never  Vaping Use   Vaping Use: Never used  Substance Use Topics   Alcohol use: No   Drug use: No     Allergies   Patient has no known allergies.   Review of Systems Review of Systems PER HPI  Physical Exam Triage Vital Signs ED Triage Vitals  Enc Vitals Group     BP 10/22/22 1254 122/88     Pulse Rate 10/22/22 1254 97     Resp 10/22/22 1254 20     Temp 10/22/22 1254 98.6 F (37 C)     Temp Source 10/22/22 1254 Oral     SpO2 10/22/22 1254 95 %     Weight --      Height --      Head Circumference --      Peak Flow --      Pain Score 10/22/22 1255 7     Pain Loc --      Pain Edu? --      Excl. in [place]? --    No data found.  Updated Vital Signs BP 122/88 (BP Location: Right Arm)   Pulse 97   Temp 98.6 F (37 C) (Oral)   Resp 20   LMP 09/29/2019   SpO2 95%   Visual Acuity Right Eye Distance:   Left Eye Distance:   Bilateral Distance:    Right Eye Near:   Left Eye Near:    Bilateral Near:     Physical Exam Vitals and nursing note reviewed.  Constitutional:      Appearance: Normal appearance.  HENT:     Head: Atraumatic.     Right Ear: Tympanic membrane and external ear normal.     Left Ear: Tympanic membrane and external ear normal.     Nose: Congestion present.     Mouth/Throat:     Mouth: Mucous membranes are moist.     Pharynx: Posterior oropharyngeal erythema present.  Eyes:     Extraocular Movements: Extraocular movements  intact.     Conjunctiva/sclera: Conjunctivae normal.  Cardiovascular:  Rate and Rhythm: Normal rate and regular rhythm.     Heart sounds: Normal heart sounds.  Pulmonary:     Effort: Pulmonary effort is normal.     Breath sounds: Normal breath sounds. No wheezing or rales.  Musculoskeletal:        General: Normal range of motion.     Cervical back: Normal range of motion and neck supple.  Skin:    General: Skin is warm and dry.  Neurological:     Mental Status: She is alert and oriented to person, place, and time.  Psychiatric:        Mood and Affect: Mood normal.        Thought Content: Thought content normal.      UC Treatments / Results  Labs (all labs ordered are listed, but only abnormal results are displayed) Labs Reviewed - No data to display  EKG   Radiology No results found.  Procedures Procedures (including critical care time)  Medications Ordered in UC Medications - No data to display  Initial Impression / Assessment and Plan / UC Course  I have reviewed the triage vital signs and the nursing notes.  Pertinent labs & imaging results that were available during my care of the patient were reviewed by me and considered in my medical decision making (see chart for details).     Given duration and worsening course, will treat with Augmentin, Flonase, Phenergan DM, supportive over-the-counter medications and home care.  Return for worsening symptoms.  Final Clinical Impressions(s) / UC Diagnoses   Final diagnoses:  Acute non-recurrent maxillary sinusitis  Acute cough   Discharge Instructions   None    ED Prescriptions     Medication Sig Dispense Auth. Provider   promethazine-dextromethorphan (PROMETHAZINE-DM) 6.25-15 MG/5ML syrup Take 5 mLs by mouth 4 (four) times daily as needed. 100 mL Volney American, PA-C   amoxicillin-clavulanate (AUGMENTIN) 875-125 MG tablet Take 1 tablet by mouth every 12 (twelve) hours. 14 tablet Volney American, PA-C   fluticasone [facility]) 50 MCG/ACT nasal spray Place 1 spray into both nostrils 2 (two) times daily. 16 g Volney American, [place]      PDMP not reviewed this encounter.   Volney American, [place] 10/22/22 1339

## 2022-10-27 ENCOUNTER — Ambulatory Visit
Admission: EM | Admit: 2022-10-27 | Discharge: 2022-10-27 | Disposition: A | Discharge status: Home / Self Care | Payer: Medicaid Other | Attending: Nurse Practitioner | Admitting: Nurse Practitioner

## 2022-10-27 DIAGNOSIS — J0100 Acute maxillary sinusitis, unspecified: Secondary | ICD-10-CM | POA: Diagnosis not present

## 2022-10-27 DIAGNOSIS — H66002 Acute suppurative otitis media without spontaneous rupture of ear drum, left ear: Secondary | ICD-10-CM

## 2022-10-27 MED ORDER — BENZONATATE 100 MG PO CAPS: 100.0000 mg | ORAL_CAPSULE | Freq: Three times a day (TID) | ORAL | 0 refills | Status: DC | PRN

## 2022-10-27 NOTE — ED Triage Notes (Signed)
Pt reports she has chills, coughing, nasal congestion, mid abdominal pain , pain in the back of her neck and watery eyes. She says she can't hardly sleep due to cough. She was seen in here on the 2nd and says those meds dont help.

## 2022-10-27 NOTE — ED Provider Notes (Signed)
[facility]    CSN: [id number] Arrival date & time: 10/27/22  1602      History   Chief Complaint No chief complaint on file.   HPI Monica Hancock is a 41 y.o. female.   Patient presents for 5 days of chills, sweats, congested cough, ear pressure, and chest and nasal congestion, runny nose, abdominal pain from coughing, soft stool since starting the Augmentin.  She denies shortness of breath or chest pain, sore throat, headache, ear drainage, nausea/vomiting, and loss of taste or smell.  Has been taking Augmentin, cough syrup, and Flonase without much benefit per her report.    Past Medical History:  Diagnosis Date   BRCA1 positive    Cancer ([place])    Right Breast   Family history of adverse reaction to anesthesia    PONV-children   Family history of BRCA1 gene positive    Family history of breast cancer    H/O Bell's palsy 2000   Left sided   HSV infection     Patient Active Problem List   Diagnosis Date Noted   Long term current use of aromatase inhibitor 06/24/2021   Hot flashes due to surgical menopause 06/24/2021   Encounter to establish care 02/25/2021   Hot flashes 02/25/2021   RBBB 02/25/2021   Shingles 02/25/2021   Status post abdominal hysterectomy 04/14/2020   S/P bilateral mastectomy 01/24/2020   Elevated alkaline phosphatase level 11/14/2019   Malignant neoplasm of upper-outer quadrant of right female breast ([place]) 08/07/2019   Breast lump on right side at 10 o'clock position 08/01/2019   Breast lump on left side at 3 o'clock position 08/01/2019   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   Family history of BRCA1 gene positive     Past Surgical History:  Procedure Laterality Date   ABDOMINAL HYSTERECTOMY N/A    [employer] 02/25/2021   BREAST BIOPSY Right    BREAST SURGERY N/A    [employer] 02/25/2021   IR IMAGING GUIDED PORT INSERTION  08/26/2019   Right   IR REMOVAL TUN ACCESS W/ PORT W/O FL MOD SED   04/25/2022   MASTECTOMY MODIFIED RADICAL Right 01/24/2020   Procedure: RIGHT MASTECTOMY MODIFIED RADICAL;  Surgeon: Aviva Signs, MD;  Location: [facility];  Service: General;  Laterality: Right;   SALPINGOOPHORECTOMY Bilateral 04/14/2020   Procedure: OPEN BILATERAL SALPINGO OOPHORECTOMY;  Surgeon: [name], MD;  Location: [facility];  Service: Gynecology;  Laterality: Bilateral;   SIMPLE MASTECTOMY WITH AXILLARY SENTINEL NODE BIOPSY Left 01/24/2020   Procedure: LEFT SIMPLE MASTECTOMY;  Surgeon: Aviva Signs, MD;  Location: [facility];  Service: General;  Laterality: Left;   SUPRACERVICAL ABDOMINAL HYSTERECTOMY N/A 04/14/2020   Procedure: HYSTERECTOMY SUPRACERVICAL ABDOMINAL;  Surgeon: [name], MD;  Location: [facility];  Service: Gynecology;  Laterality: N/A;   TUBAL LIGATION      OB History     Gravida  5   Para  5   Term  4   Preterm  1   AB  0   Living  5      SAB  0   IAB  0   Ectopic  0   Multiple  0   Live Births  5            Home Medications    Prior to Admission medications   Medication Sig Start Date End Date Taking? Authorizing Provider  benzonatate (TESSALON) 100 MG capsule Take 1 capsule (100 mg total) by mouth 3 (  three) times daily as needed for cough. Do not take with alcohol or while driving or operating heavy machinery.  May cause drowsiness. 10/27/22  Yes [name] A, NP  amoxicillin-clavulanate (AUGMENTIN) 875-125 MG tablet Take 1 tablet by mouth every 12 (twelve) hours. 10/22/22   Volney American, PA-C  anastrozole (ARIMIDEX) 1 MG tablet TAKE 1 TABLET(1 MG) BY MOUTH DAILY 11/02/21   Derek Jack, MD  Calcium Carbonate-Vit D-Min (CALCIUM 1200 PO) Take by mouth.    [provider]  diphenhydrAMINE (BENADRYL) 50 MG tablet Take 50 mg by mouth every 8 (eight) hours as needed for allergies.    [provider]  fluticasone (FLONASE) 50 MCG/ACT nasal spray Place 1 spray into both nostrils 2 (two) times daily. 10/22/22    Volney American, PA-C  ibuprofen (ADVIL) 600 MG tablet Take 1 tablet (600 mg total) by mouth every 6 (six) hours as needed for fever or headache. 04/16/20   [name], MD  loratadine (CLARITIN) 10 MG tablet Take 10 mg by mouth daily as needed for allergies.     [provider]  promethazine-dextromethorphan (PROMETHAZINE-DM) 6.25-15 MG/5ML syrup Take 5 mLs by mouth 4 (four) times daily as needed. 10/22/22   Volney American, PA-C    Family History Family History  Problem Relation Age of Onset   Diabetes Mother    Breast cancer Mother 43   Cancer Mother    Diabetes Maternal Grandmother    Stomach cancer Maternal Grandfather    Breast cancer Maternal Aunt        dx in her 14s   Diabetes Maternal Aunt     Social History Social History   Tobacco Use   Smoking status: Never   Smokeless tobacco: Never  Vaping Use   Vaping Use: Never used  Substance Use Topics   Alcohol use: No   Drug use: No     Allergies   Patient has no known allergies.   Review of Systems Review of Systems Per HPI  Physical Exam Triage Vital Signs ED Triage Vitals  Enc Vitals Group     BP 10/27/22 1726 124/79     Pulse Rate 10/27/22 1726 (!) 106     Resp 10/27/22 1726 20     Temp 10/27/22 1726 99.8 F (37.7 C)     Temp Source 10/27/22 1726 Oral     SpO2 10/27/22 1726 98 %     Weight --      Height --      Head Circumference --      Peak Flow --      Pain Score 10/27/22 1729 7     Pain Loc --      Pain Edu? --      Excl. in [place]? --    No data found.  Updated Vital Signs BP 124/79 (BP Location: Left Arm)   Pulse (!) 106   Temp 99.8 F (37.7 C) (Oral)   Resp 20   LMP 09/29/2019   SpO2 98%   Visual Acuity Right Eye Distance:   Left Eye Distance:   Bilateral Distance:    Right Eye Near:   Left Eye Near:    Bilateral Near:     Physical Exam Vitals and nursing note reviewed.  Constitutional:      General: She is not in acute distress.    Appearance:  Normal appearance. She is not ill-appearing or toxic-appearing.  HENT:     Head: Normocephalic and atraumatic.  Right Ear: Tympanic membrane, ear canal and external ear normal.     Left Ear: Ear canal and external ear normal. Tympanic membrane is erythematous and bulging.     Nose: Congestion and rhinorrhea present.     Mouth/Throat:     Mouth: Mucous membranes are moist.     Pharynx: Oropharynx is clear. Posterior oropharyngeal erythema present. No oropharyngeal exudate.  Eyes:     General: No scleral icterus.    Extraocular Movements: Extraocular movements intact.  Cardiovascular:     Rate and Rhythm: Normal rate and regular rhythm.  Pulmonary:     Effort: Pulmonary effort is normal. No respiratory distress.     Breath sounds: Normal breath sounds. No wheezing, rhonchi or rales.  Abdominal:     General: Abdomen is flat. Bowel sounds are normal. There is no distension.     Palpations: Abdomen is soft.     Tenderness: There is no abdominal tenderness. There is no guarding.  Musculoskeletal:     Cervical back: Normal range of motion and neck supple.  Lymphadenopathy:     Cervical: No cervical adenopathy.  Skin:    General: Skin is warm and dry.     Coloration: Skin is not jaundiced or pale.     Findings: No erythema or rash.  Neurological:     Mental Status: She is alert and oriented to person, place, and time.  Psychiatric:        Behavior: Behavior is cooperative.      UC Treatments / Results  Labs (all labs ordered are listed, but only abnormal results are displayed) Labs Reviewed - No data to display  EKG   Radiology No results found.  Procedures Procedures (including critical care time)  Medications Ordered in UC Medications - No data to display  Initial Impression / Assessment and Plan / UC Course  I have reviewed the triage vital signs and the nursing notes.  Pertinent labs & imaging results that were available during my care of the patient were  reviewed by me and considered in my medical decision making (see chart for details).   Patient is well-appearing, normotensive,  not tachypneic, oxygenating well on room air.  She is mildly tachycardic and has a low-grade fever today in urgent care, likely secondary to acute illness.  Acute non-recurrent maxillary sinusitis Recommended continued Augmentin, Flonase, cough syrup as needed Can also start Tessalon Perles to help with dry cough Recommended tincture of time ER and return precautions discussed  Non-recurrent acute suppurative otitis media of left ear without spontaneous rupture of tympanic membrane Continue Augmentin-discussed that this will treat ear infection as well  The patient was given the opportunity to ask questions.  All questions answered to their satisfaction.  The patient is in agreement to this plan.    Final Clinical Impressions(s) / UC Diagnoses   Final diagnoses:  Acute non-recurrent maxillary sinusitis  Non-recurrent acute suppurative otitis media of left ear without spontaneous rupture of tympanic membrane     Discharge Instructions      You can start the Tessalon Perles to help with a dry cough.  Please continue the Augmentin to completion to treat the left ear infection.  Continue ibuprofen, can alternate with Tylenol to help with symptoms during the day.  You are likely not contagious since your symptoms have been ongoing for the past 5 days.     ED Prescriptions     Medication Sig Dispense Auth. Provider   benzonatate (TESSALON) 100 MG capsule Take  1 capsule (100 mg total) by mouth 3 (three) times daily as needed for cough. Do not take with alcohol or while driving or operating heavy machinery.  May cause drowsiness. 21 capsule Eulogio Bear, NP      PDMP not reviewed this encounter.   Eulogio Bear, NP 10/27/22 1752

## 2022-10-27 NOTE — Discharge Instructions (Addendum)
You can start the Tessalon Perles to help with a dry cough.  Please continue the Augmentin to completion to treat the left ear infection.  Continue ibuprofen, can alternate with Tylenol to help with symptoms during the day.  You are likely not contagious since your symptoms have been ongoing for the past 5 days.

## 2022-11-10 ENCOUNTER — Other Ambulatory Visit: Payer: Self-pay | Admitting: *Deleted

## 2022-11-10 ENCOUNTER — Other Ambulatory Visit (HOSPITAL_COMMUNITY): Payer: Self-pay | Admitting: Hematology

## 2022-11-10 NOTE — Telephone Encounter (Signed)
Anastrozole refill approved.  Patient is tolerating and is to continue therapy.  

## 2023-01-17 ENCOUNTER — Ambulatory Visit
Admission: EM | Admit: 2023-01-17 | Discharge: 2023-01-17 | Disposition: A | Discharge status: Home / Self Care | Payer: Medicaid Other | Attending: Nurse Practitioner | Admitting: Nurse Practitioner

## 2023-01-17 DIAGNOSIS — J020 Streptococcal pharyngitis: Secondary | ICD-10-CM

## 2023-01-17 LAB — POCT RAPID STREP A (OFFICE): Rapid Strep A Screen: POSITIVE — AB

## 2023-01-17 MED ORDER — CEPHALEXIN 500 MG PO CAPS: 500.0000 mg | ORAL_CAPSULE | Freq: Two times a day (BID) | ORAL | 0 refills | Status: AC

## 2023-01-17 MED ORDER — LIDOCAINE VISCOUS HCL 2 % MT SOLN: 15.0000 mL | OROMUCOSAL | 0 refills | Status: DC | PRN

## 2023-01-17 NOTE — ED Triage Notes (Signed)
Pt reports chills, fever, sore throat and headache x 2 days. Pt feels she will develop cough. Ibuprofen gives some relief.

## 2023-01-17 NOTE — ED Provider Notes (Signed)
[facility]    CSN: [id number] Arrival date & time: 01/17/23  1746      History   Chief Complaint Chief Complaint  Patient presents with   Sore Throat   Chills         HPI Monica Hancock is a 42 y.o. female.   Patient presents today with 2 day history of sore throat.  Also having fever, body aches, chills, headache, ear pain, decreased appetite, and fatigue.  Works at a [employer] and reports multiple positive strep throat contacts.  Patient denies cough, congestion, congested cough, dry cough, shortness of breath, chest pain with coughing, wheezing, chest tightness, chest congestion, nasal congestion, runny nose, post nasal drainage, sinus pressure, abdominal pain, nausea, vomiting, diarrhea, loss of taste, loss of smell, and rash. Has taken ibuprofen which did not help with sore throat very much.  Reports she has had strep throat more than 3 times in the past 6 months.  She denies antibiotic use in the past 90 days.  Also denies allergies to antibiotic therapy.    Past Medical History:  Diagnosis Date   BRCA1 positive    Cancer ([place])    Right Breast   Family history of adverse reaction to anesthesia    PONV-children   Family history of BRCA1 gene positive    Family history of breast cancer    H/O Bell's palsy 2000   Left sided   HSV infection     Patient Active Problem List   Diagnosis Date Noted   Long term current use of aromatase inhibitor 06/24/2021   Hot flashes due to surgical menopause 06/24/2021   Encounter to establish care 02/25/2021   Hot flashes 02/25/2021   RBBB 02/25/2021   Shingles 02/25/2021   Status post abdominal hysterectomy 04/14/2020   S/P bilateral mastectomy 01/24/2020   Elevated alkaline phosphatase level 11/14/2019   Malignant neoplasm of upper-outer quadrant of right female breast ([place]) 08/07/2019   Breast lump on right side at 10 o'clock position 08/01/2019   Breast lump on left side at 3 o'clock position 08/01/2019    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   Family history of BRCA1 gene positive     Past Surgical History:  Procedure Laterality Date   ABDOMINAL HYSTERECTOMY N/A    [employer] 02/25/2021   BREAST BIOPSY Right    BREAST SURGERY N/A    [employer] 02/25/2021   IR IMAGING GUIDED PORT INSERTION  08/26/2019   Right   IR REMOVAL TUN ACCESS W/ PORT W/O FL MOD SED  04/25/2022   MASTECTOMY MODIFIED RADICAL Right 01/24/2020   Procedure: RIGHT MASTECTOMY MODIFIED RADICAL;  Surgeon: Aviva Signs, MD;  Location: [facility];  Service: General;  Laterality: Right;   SALPINGOOPHORECTOMY Bilateral 04/14/2020   Procedure: OPEN BILATERAL SALPINGO OOPHORECTOMY;  Surgeon: [name], MD;  Location: [facility];  Service: Gynecology;  Laterality: Bilateral;   SIMPLE MASTECTOMY WITH AXILLARY SENTINEL NODE BIOPSY Left 01/24/2020   Procedure: LEFT SIMPLE MASTECTOMY;  Surgeon: Aviva Signs, MD;  Location: [facility];  Service: General;  Laterality: Left;   SUPRACERVICAL ABDOMINAL HYSTERECTOMY N/A 04/14/2020   Procedure: HYSTERECTOMY SUPRACERVICAL ABDOMINAL;  Surgeon: [name], MD;  Location: [facility];  Service: Gynecology;  Laterality: N/A;   TUBAL LIGATION      OB History     Gravida  5   Para  5   Term  4   Preterm  1   AB  0   Living  5  SAB  0   IAB  0   Ectopic  0   Multiple  0   Live Births  5            Home Medications    Prior to Admission medications   Medication Sig Start Date End Date Taking? Authorizing Provider  cephALEXin (KEFLEX) 500 MG capsule Take 1 capsule (500 mg total) by mouth 2 (two) times daily for 10 days. 01/17/23 01/27/23 Yes [name] A, NP  lidocaine (XYLOCAINE) 2 % solution Use as directed 15 mLs in the mouth or throat as needed for mouth pain. Gargle and spit as needed for throat or mouth pain 01/17/23  Yes [name] A, NP  anastrozole (ARIMIDEX) 1 MG tablet TAKE 1 TABLET(1 MG) BY MOUTH DAILY 11/10/22   Derek Jack, MD  benzonatate (TESSALON) 100 MG capsule Take 1 capsule (100 mg total) by mouth 3 (three) times daily as needed for cough. Do not take with alcohol or while driving or operating heavy machinery.  May cause drowsiness. 10/27/22   Eulogio Bear, NP  Calcium Carbonate-Vit D-Min (CALCIUM 1200 PO) Take by mouth.    [provider]  diphenhydrAMINE (BENADRYL) 50 MG tablet Take 50 mg by mouth every 8 (eight) hours as needed for allergies.    [provider]  fluticasone (FLONASE) 50 MCG/ACT nasal spray Place 1 spray into both nostrils 2 (two) times daily. 10/22/22   Volney American, PA-C  ibuprofen (ADVIL) 600 MG tablet Take 1 tablet (600 mg total) by mouth every 6 (six) hours as needed for fever or headache. 04/16/20   [name], MD  loratadine (CLARITIN) 10 MG tablet Take 10 mg by mouth daily as needed for allergies.     [provider]  promethazine-dextromethorphan (PROMETHAZINE-DM) 6.25-15 MG/5ML syrup Take 5 mLs by mouth 4 (four) times daily as needed. 10/22/22   Volney American, PA-C    Family History Family History  Problem Relation Age of Onset   Diabetes Mother    Breast cancer Mother 23   Cancer Mother    Diabetes Maternal Grandmother    Stomach cancer Maternal Grandfather    Breast cancer Maternal Aunt        dx in her 48s   Diabetes Maternal Aunt     Social History Social History   Tobacco Use   Smoking status: Never   Smokeless tobacco: Never  Vaping Use   Vaping Use: Never used  Substance Use Topics   Alcohol use: No   Drug use: No     Allergies   Patient has no known allergies.   Review of Systems Review of Systems Per HPI  Physical Exam Triage Vital Signs ED Triage Vitals  Enc Vitals Group     BP 01/17/23 1800 120/84     Pulse Rate 01/17/23 1800 100     Resp 01/17/23 1800 18     Temp 01/17/23 1800 98.4 F (36.9 C)     Temp Source 01/17/23 1800 Oral     SpO2 01/17/23 1800 100 %     Weight --       Height --      Head Circumference --      Peak Flow --      Pain Score 01/17/23 1758 7     Pain Loc --      Pain Edu? --      Excl. in [place]? --    No data found.  Updated Vital  Signs BP 120/84 (BP Location: Right Arm)   Pulse 100   Temp 98.4 F (36.9 C) (Oral)   Resp 18   LMP 09/29/2019   SpO2 100%   Visual Acuity Right Eye Distance:   Left Eye Distance:   Bilateral Distance:    Right Eye Near:   Left Eye Near:    Bilateral Near:     Physical Exam Vitals and nursing note reviewed.  Constitutional:      General: She is not in acute distress.    Appearance: She is well-developed. She is not toxic-appearing.  HENT:     Head: Normocephalic and atraumatic.     Right Ear: Tympanic membrane and ear canal normal. No drainage, swelling or tenderness. No middle ear effusion. Tympanic membrane is not erythematous.     Left Ear: Tympanic membrane and ear canal normal. No drainage, swelling or tenderness.  No middle ear effusion. Tympanic membrane is not erythematous.     Nose: No congestion or rhinorrhea.     Mouth/Throat:     Mouth: Mucous membranes are moist.     Pharynx: Uvula midline. Posterior oropharyngeal erythema and uvula swelling present. No pharyngeal swelling or oropharyngeal exudate.     Tonsils: No tonsillar exudate. 2+ on the right. 2+ on the left.  Eyes:     Extraocular Movements:     Right eye: Normal extraocular motion.     Left eye: Normal extraocular motion.  Cardiovascular:     Rate and Rhythm: Normal rate and regular rhythm.  Pulmonary:     Effort: Pulmonary effort is normal. No respiratory distress.     Breath sounds: Normal breath sounds. No wheezing, rhonchi or rales.  Abdominal:     General: Abdomen is flat. Bowel sounds are normal.     Palpations: Abdomen is soft.  Musculoskeletal:     Cervical back: Normal range of motion and neck supple.  Lymphadenopathy:     Cervical: No cervical adenopathy.  Skin:    General: Skin is warm and dry.      Capillary Refill: Capillary refill takes less than 2 seconds.     Coloration: Skin is not pale.     Findings: No erythema or rash.  Neurological:     Mental Status: She is alert and oriented to person, place, and time.  Psychiatric:        Behavior: Behavior is cooperative.      UC Treatments / Results  Labs (all labs ordered are listed, but only abnormal results are displayed) Labs Reviewed  POCT RAPID STREP A (OFFICE) - Abnormal; Notable for the following components:      Result Value   Rapid Strep A Screen Positive (*)    All other components within normal limits    EKG   Radiology No results found.  Procedures Procedures (including critical care time)  Medications Ordered in UC Medications - No data to display  Initial Impression / Assessment and Plan / UC Course  I have reviewed the triage vital signs and the nursing notes.  Pertinent labs & imaging results that were available during my care of the patient were reviewed by me and considered in my medical decision making (see chart for details).   Patient is well-appearing, normotensive, afebrile, not tachycardic, not tachypneic, oxygenating well on room air.    1. Strep pharyngitis Given recent Augmentin use, will treat with Keflex twice daily for 10 days Change toothbrush after starting treatment Start lidocaine rinses as needed for throat pain Recommended  follow-up with ENT if this has been more than 3 occurrences of strep throat in the past 6 months ER and return precautions discussed with patient Note given for work  The patient was given the opportunity to ask questions.  All questions answered to their satisfaction.  The patient is in agreement to this plan.    Final Clinical Impressions(s) / UC Diagnoses   Final diagnoses:  Strep pharyngitis     Discharge Instructions      As we discussed, you have strep throat.  Please take the Keflex as prescribed to treat it.  Finish the entire course even if  you start feeling better halfway through it.  Make sure you change your toothbrush today or tomorrow to prevent reinfection.  You can use the lidocaine rinses as needed for mouth or throat pain.  If this has been the third time you have had this in the past 6 months, recommend following up with the ear nose and throat provider; contact information is below     ED Prescriptions     Medication Sig Dispense Auth. Provider   cephALEXin (KEFLEX) 500 MG capsule Take 1 capsule (500 mg total) by mouth 2 (two) times daily for 10 days. 20 capsule [name] A, NP   lidocaine (XYLOCAINE) 2 % solution Use as directed 15 mLs in the mouth or throat as needed for mouth pain. Gargle and spit as needed for throat or mouth pain 100 mL Eulogio Bear, NP      PDMP not reviewed this encounter.   Eulogio Bear, NP 01/17/23 [phone]

## 2023-01-17 NOTE — Discharge Instructions (Addendum)
As we discussed, you have strep throat.  Please take the Keflex as prescribed to treat it.  Finish the entire course even if you start feeling better halfway through it.  Make sure you change your toothbrush today or tomorrow to prevent reinfection.  You can use the lidocaine rinses as needed for mouth or throat pain.  If this has been the third time you have had this in the past 6 months, recommend following up with the ear nose and throat provider; contact information is below

## 2023-01-27 ENCOUNTER — Other Ambulatory Visit: Payer: Self-pay | Admitting: Family Medicine

## 2023-01-27 NOTE — Telephone Encounter (Signed)
Last ordered by R. Lane at [facility]  Requested Prescriptions  Refused Prescriptions Disp Refills   fluticasone (FLONASE) 50 MCG/ACT nasal spray [Pharmacy Med Name: FLUTICASONE 50MCG NASAL SP (120) RX] 16 g 2    Sig: SHAKE LIQUID AND USE 1 SPRAY IN EACH NOSTRIL TWICE DAILY     There is no refill protocol information for this order

## 2023-03-08 ENCOUNTER — Ambulatory Visit (HOSPITAL_COMMUNITY)
Admission: RE | Admit: 2023-03-08 | Discharge: 2023-03-08 | Disposition: A | Discharge status: Home / Self Care | Payer: Medicaid Other | Source: Ambulatory Visit | Attending: Hematology | Admitting: Hematology

## 2023-03-08 ENCOUNTER — Inpatient Hospital Stay: Payer: Medicaid Other | Attending: Hematology

## 2023-03-08 DIAGNOSIS — C50411 Malignant neoplasm of upper-outer quadrant of right female breast: Secondary | ICD-10-CM | POA: Insufficient documentation

## 2023-03-08 DIAGNOSIS — E876 Hypokalemia: Secondary | ICD-10-CM | POA: Insufficient documentation

## 2023-03-08 DIAGNOSIS — Z79811 Long term (current) use of aromatase inhibitors: Secondary | ICD-10-CM | POA: Insufficient documentation

## 2023-03-08 DIAGNOSIS — Z170 Estrogen receptor positive status [ER+]: Secondary | ICD-10-CM | POA: Insufficient documentation

## 2023-03-08 DIAGNOSIS — Z90710 Acquired absence of both cervix and uterus: Secondary | ICD-10-CM | POA: Diagnosis not present

## 2023-03-08 DIAGNOSIS — Z90722 Acquired absence of ovaries, bilateral: Secondary | ICD-10-CM | POA: Insufficient documentation

## 2023-03-08 LAB — COMPREHENSIVE METABOLIC PANEL
ALT: 42 U/L (ref 0–44)
AST: 25 U/L (ref 15–41)
Albumin: 4 g/dL (ref 3.5–5.0)
Alkaline Phosphatase: 84 U/L (ref 38–126)
Anion gap: 10 (ref 5–15)
BUN: 19 mg/dL (ref 6–20)
CO2: 23 mmol/L (ref 22–32)
Calcium: 9.8 mg/dL (ref 8.9–10.3)
Chloride: 102 mmol/L (ref 98–111)
Creatinine, Ser: 0.64 mg/dL (ref 0.44–1.00)
GFR, Estimated: >60 mL/min (ref >60)
Glucose, Bld: 113 mg/dL — ABNORMAL HIGH (ref 70–99)
Potassium: 3.1 mmol/L — ABNORMAL LOW (ref 3.5–5.1)
Sodium: 135 mmol/L (ref 135–145)
Total Bilirubin: 0.6 mg/dL (ref 0.3–1.2)
Total Protein: 8 g/dL (ref 6.5–8.1)

## 2023-03-08 LAB — CBC WITH DIFFERENTIAL/PLATELET
Abs Immature Granulocytes: 0.03 10*3/uL (ref 0.00–0.07)
Basophils Absolute: 0 10*3/uL (ref 0.0–0.1)
Basophils Relative: 0 %
Eosinophils Absolute: 0.1 10*3/uL (ref 0.0–0.5)
Eosinophils Relative: 2 %
HCT: 40.4 % (ref 36.0–46.0)
Hemoglobin: 13.4 g/dL (ref 12.0–15.0)
Immature Granulocytes: 0 %
Lymphocytes Relative: 44 %
Lymphs Abs: 3.7 10*3/uL (ref 0.7–4.0)
MCH: 29 pg (ref 26.0–34.0)
MCHC: 33.2 g/dL (ref 30.0–36.0)
MCV: 87.4 fL (ref 80.0–100.0)
Monocytes Absolute: 0.5 10*3/uL (ref 0.1–1.0)
Monocytes Relative: 5 %
Neutro Abs: 4.1 10*3/uL (ref 1.7–7.7)
Neutrophils Relative %: 49 %
Platelets: 332 10*3/uL (ref 150–400)
RBC: 4.62 MIL/uL (ref 3.87–5.11)
RDW: 12.7 % (ref 11.5–15.5)
WBC: 8.5 10*3/uL (ref 4.0–10.5)
nRBC: 0 % (ref 0.0–0.2)

## 2023-03-08 LAB — MAGNESIUM: Magnesium: 1.9 mg/dL (ref 1.7–2.4)

## 2023-03-08 LAB — VITAMIN D 25 HYDROXY (VIT D DEFICIENCY, FRACTURES): Vit D, 25-Hydroxy: 31.27 ng/mL (ref 30–100)

## 2023-03-10 LAB — CANCER ANTIGEN 15-3: CA 15-3: 21.5 U/mL (ref 0.0–25.0)

## 2023-03-15 ENCOUNTER — Ambulatory Visit: Payer: Medicaid Other | Admitting: Hematology

## 2023-03-16 ENCOUNTER — Ambulatory Visit: Payer: Medicaid Other | Admitting: Physician Assistant

## 2023-03-23 ENCOUNTER — Inpatient Hospital Stay: Payer: Medicaid Other | Attending: Physician Assistant | Admitting: Physician Assistant

## 2023-03-23 ENCOUNTER — Encounter: Payer: Self-pay | Admitting: Physician Assistant

## 2023-03-23 DIAGNOSIS — Z1501 Genetic susceptibility to malignant neoplasm of breast: Secondary | ICD-10-CM | POA: Insufficient documentation

## 2023-03-23 DIAGNOSIS — Z9013 Acquired absence of bilateral breasts and nipples: Secondary | ICD-10-CM | POA: Insufficient documentation

## 2023-03-23 DIAGNOSIS — E876 Hypokalemia: Secondary | ICD-10-CM | POA: Insufficient documentation

## 2023-03-23 DIAGNOSIS — Z79811 Long term (current) use of aromatase inhibitors: Secondary | ICD-10-CM | POA: Diagnosis not present

## 2023-03-23 DIAGNOSIS — C50411 Malignant neoplasm of upper-outer quadrant of right female breast: Secondary | ICD-10-CM | POA: Diagnosis present

## 2023-03-23 DIAGNOSIS — Z170 Estrogen receptor positive status [ER+]: Secondary | ICD-10-CM | POA: Insufficient documentation

## 2023-03-23 MED ORDER — POTASSIUM CHLORIDE CRYS ER 20 MEQ PO TBCR: 20.0000 meq | EXTENDED_RELEASE_TABLET | Freq: Every day | ORAL | 0 refills | Status: AC

## 2023-03-23 NOTE — Progress Notes (Signed)
[facility] 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 [facility])   08/27/2019 - 12/12/2019 Chemotherapy   The patient had palonosetron (ALOXI) injection 0.25 mg, 0.25 mg, Intravenous,  Once, 6 of 6 cycles Administration: 0.25 mg (08/27/2019), 0.25 mg (09/17/2019), 0.25 mg (11/19/2019), 0.25 mg (12/10/2019), 0.25 mg (10/08/2019), 0.25 mg (10/29/2019) pegfilgrastim-jmdb (FULPHILA) injection 6 mg, 6 mg, Subcutaneous,  Once, 6 of 6 cycles Administration: 6 mg (08/29/2019), 6 mg (09/19/2019), 6 mg (11/21/2019), 6 mg (12/12/2019), 6 mg (10/10/2019), 6 mg (10/31/2019) trastuzumab (HERCEPTIN) 600 mg in sodium chloride 0.9 % 250 mL chemo infusion, 567 mg (100 % of original dose 8 mg/kg), Intravenous,  Once, 1 of 1 cycle Dose modification: 8 mg/kg (original dose 8 mg/kg, Cycle 1, Reason: Other (see comments), Comment: using up last of our Herceptin at [facility]) Administration: 600 mg (08/27/2019) CARBOplatin (PARAPLATIN) 780 mg in sodium chloride 0.9 % 250 mL chemo infusion, 780 mg (100 % of original dose 784.8 mg), Intravenous,  Once, 6 of 6 cycles Dose modification:   (original dose 784.8 mg, Cycle 1),   (original dose 784.8 mg, Cycle 2),   (original dose 784.8 mg, Cycle 5), 784.8 mg (original dose 784.8 mg, Cycle 5, Reason: Other (see comments), Comment: scr 0.8 entered),   (original dose 784.8 mg, Cycle 6),   (original dose 784.8 mg, Cycle 3),   (original dose 784.8 mg, Cycle 4) Administration: 780 mg (08/27/2019), 780 mg (09/17/2019), 780 mg (11/19/2019), 780 mg (12/10/2019), 780 mg (10/08/2019), 780 mg (10/29/2019) DOCEtaxel (TAXOTERE) 130 mg in  sodium chloride 0.9 % 250 mL chemo infusion, 75 mg/m2 = 130 mg, Intravenous,  Once, 6 of 6 cycles Administration: 130 mg (08/27/2019), 130 mg (09/17/2019), 130 mg (11/19/2019), 130 mg (12/10/2019), 130 mg (10/08/2019), 130 mg (10/29/2019) pertuzumab (PERJETA) 840 mg in sodium chloride 0.9 % 250 mL chemo infusion, 840 mg, Intravenous, Once, 6 of 6 cycles Administration: 840 mg (08/27/2019), 420 mg (09/17/2019), 420 mg (11/19/2019), 420 mg (12/10/2019), 420 mg (10/08/2019), 420 mg (10/29/2019) fosaprepitant (EMEND) 150 mg, dexamethasone (DECADRON) 12 mg in sodium chloride 0.9 % 145 mL IVPB, , Intravenous,  Once, 6 of 6 cycles Administration:  (08/27/2019),  (09/17/2019),  (11/19/2019),  (12/10/2019),  (10/08/2019),  (10/29/2019) trastuzumab-dkst (OGIVRI) 450 mg in sodium chloride 0.9 % 250 mL chemo infusion, 420 mg, Intravenous,  Once, 5 of 5 cycles Administration: 450 mg (09/17/2019), 450 mg (11/19/2019), 420 mg (12/10/2019), 450 mg (10/08/2019), 450 mg (10/29/2019)  for chemotherapy treatment.    01/02/2020 - 10/06/2020 Chemotherapy   Patient is on Treatment Plan : BREAST Trastuzumab / Pertuzumab (Perjeta) q21d       CHIEF COMPLIANT: Follow-up for right breast cancer   INTERVAL HISTORY: [name] is a 42 y.o. female seen for follow-up of right breast cancer.  She was last seen by Dr. Ellin Saba on 09/06/2022. In the interim she denies any changes to her health. She reports her energy and appetite are stable. 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 She is tolerating anastrozole therapy without any issues. She still has stable hot flashes. She report having insomnia some night. She denies  fevers, chills ,sweats, shortness of breath, chest pain, cough, nausea, vomiting or diarrhea. She has no other complaints. Rest of the ROS is below.   REVIEW OF SYSTEMS:   Review of Systems  Constitutional:  Negative for appetite change, chills, fatigue and fever.   Respiratory:  Negative for cough and shortness of breath.   Cardiovascular:  Negative for chest pain and leg swelling.  Gastrointestinal:  Negative for abdominal pain, blood in stool, constipation, diarrhea, nausea, rectal pain and vomiting.  Endocrine: Positive for hot flashes (stable).  Neurological:  Negative for dizziness and headaches.  Psychiatric/Behavioral:  Positive for sleep disturbance.   All other systems reviewed and are negative.   I have reviewed the past medical history, past surgical history, social history and family history with the patient and they are unchanged from previous note.   ALLERGIES:   has No Known Allergies.   MEDICATIONS:  Current Outpatient Medications  Medication Sig Dispense Refill   anastrozole (ARIMIDEX) 1 MG tablet TAKE 1 TABLET(1 MG) BY MOUTH DAILY 30 tablet 6   Calcium Carbonate-Vit D-Min (CALCIUM 1200 PO) Take by mouth.     fluticasone (FLONASE) 50 MCG/ACT nasal spray Place 1 spray into both nostrils 2 (two) times daily. 16 g 2   ibuprofen (ADVIL) 600 MG tablet Take 1 tablet (600 mg total) by mouth every 6 (six) hours as needed for fever or headache. 30 tablet 0   loratadine (CLARITIN) 10 MG tablet Take 10 mg by mouth daily as needed for allergies.      No current facility-administered medications for this visit.     PHYSICAL EXAMINATION: Performance status (ECOG): 0 - Asymptomatic  There were no vitals filed for this visit.  Wt Readings from Last 3 Encounters:  04/25/22 165 lb (74.8 kg)  03/08/22 166 lb 1.6 oz (75.3 kg)  11/02/21 166 lb 1.6 oz (75.3 kg)   Physical Exam Vitals reviewed.  Constitutional:      Appearance: Normal appearance. She is obese.  Cardiovascular:     Rate and Rhythm: Normal rate and regular rhythm.     Heart sounds: Normal heart sounds.  Pulmonary:     Effort: Pulmonary effort is normal.     Breath sounds: Normal breath sounds.  Chest:  Breasts:    Right: No swelling, bleeding, mass, skin change  (mastectomy site WNL) or tenderness.     Left: No swelling, bleeding, mass, skin change (mastectomy site WNL) or tenderness.  Lymphadenopathy:     Upper Body:     Right upper body: No supraclavicular or axillary adenopathy.     Left upper body: No supraclavicular or axillary adenopathy.  Neurological:     General: No focal deficit present.     Mental Status: She is alert and oriented to person, place, and time.  Psychiatric:        Mood and Affect: Mood normal.        Behavior: Behavior normal.     LABORATORY DATA:  I have reviewed the data as listed    Latest Ref Rng & Units 03/08/2023    3:14 PM 08/30/2022    2:48 PM 03/01/2022    2:38 PM  CMP  Glucose 70 - 99 mg/dL 960  454  098   BUN 6 - 20 mg/dL 19  17  8    Creatinine 0.44 - 1.00 mg/dL 1.19  1.47  8.29   Sodium 135 - 145 mmol/L 135  139  139   Potassium 3.5 - 5.1 mmol/L 3.1  3.5  3.5   Chloride 98 - 111 mmol/L 102  105  108   CO2 22 - 32 mmol/L 23  26  24    Calcium 8.9 - 10.3 mg/dL 9.8  9.9  9.8   Total Protein 6.5 - 8.1 g/dL 8.0  7.8  7.9   Total Bilirubin 0.3 - 1.2 mg/dL 0.6  1.0  0.7   Alkaline Phos 38 - 126 U/L 84  84  94   AST 15 - 41 U/L 25  36  28   ALT 0 - 44 U/L 42  50  40    Lab Results  Component Value Date   CAN153 21.5 03/08/2023   CAN153 18.8 08/30/2022   CAN153 19.7 03/01/2022   Lab Results  Component Value Date   WBC 8.5 03/08/2023   HGB 13.4 03/08/2023   HCT 40.4 03/08/2023   MCV 87.4 03/08/2023   PLT 332 03/08/2023   NEUTROABS 4.1 03/08/2023    ASSESSMENT:  1.  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 -Herceptin and Pertuzumab maintenance started on 01/02/2020. -Bilateral mastectomies on 01/24/2020.  Pathology showed YPT0Y PN 0, 0/6 lymph nodes involved. -She met with Dr. Roselind Messier who did not recommend adjuvant radiation. -TAH/BSO on 04/14/2020. -Anastrozole to start on 04/24/2020.   2.  BRCA1  positive: -Bilateral mastectomies on 01/24/2020. -TAH/BSO on 04/14/2020.   PLAN:  1.  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  - Bilateral mastectomy sites are within normal limits.  No palpable adenopathy or other masses appreciate on physical exam. - Reviewed labs from 03/08/2023. No cytopenias. Creatinine and LFTs normal.  CA 15-3 was normal. - Recommend follow-up in 6 months with repeat labs and exam.   2.  Bone health: - Vitamin D level is 31.27 - Continue calcium and vitamin D supplements. - Bone density scan from 03/08/2023 had a T-score of -0.7, normal. Plan to repeat in 2 years.   3. Hypokalemia: --Potassium level was 3.1 on 03/08/2023. --Sent 7 day course of potassium chloride 20 mEq daily --Will return in 2 weeks to repeat potassium level.   Breast Cancer therapy associated bone loss: I have recommended calcium, Vitamin D and weight bearing exercises.    Orders placed this encounter:  No orders of the defined types were placed in this encounter.   The patient has a good understanding of the overall plan. She agrees with it. She will call with any problems that may develop before the next visit here.  I have spent a total of 30 minutes minutes of face-to-face and non-face-to-face time, preparing to see the patient,performing a medically appropriate examination, counseling and educating the patient, ordering medications/tests/procedures, documenting clinical information in the electronic health record, and care coordination.   Georga Kaufmann PA-C Dept of Hematology and Oncology [facility]

## 2023-03-23 NOTE — Progress Notes (Signed)
Patient taking and tolerating Anastrozole without complications.

## 2023-03-28 ENCOUNTER — Other Ambulatory Visit: Payer: Self-pay | Admitting: Physician Assistant

## 2023-04-05 ENCOUNTER — Inpatient Hospital Stay: Payer: Medicaid Other | Admitting: Hematology

## 2023-04-05 DIAGNOSIS — C50411 Malignant neoplasm of upper-outer quadrant of right female breast: Secondary | ICD-10-CM | POA: Diagnosis not present

## 2023-04-05 DIAGNOSIS — E876 Hypokalemia: Secondary | ICD-10-CM

## 2023-04-05 LAB — COMPREHENSIVE METABOLIC PANEL
ALT: 47 U/L — ABNORMAL HIGH (ref 0–44)
AST: 32 U/L (ref 15–41)
Albumin: 4 g/dL (ref 3.5–5.0)
Alkaline Phosphatase: 87 U/L (ref 38–126)
Anion gap: 9 (ref 5–15)
BUN: 14 mg/dL (ref 6–20)
CO2: 24 mmol/L (ref 22–32)
Calcium: 9.7 mg/dL (ref 8.9–10.3)
Chloride: 104 mmol/L (ref 98–111)
Creatinine, Ser: 0.7 mg/dL (ref 0.44–1.00)
GFR, Estimated: >60 mL/min (ref >60)
Glucose, Bld: 111 mg/dL — ABNORMAL HIGH (ref 70–99)
Potassium: 3.8 mmol/L (ref 3.5–5.1)
Sodium: 137 mmol/L (ref 135–145)
Total Bilirubin: 0.7 mg/dL (ref 0.3–1.2)
Total Protein: 8 g/dL (ref 6.5–8.1)

## 2023-04-11 NOTE — Progress Notes (Signed)
Patient called and made aware of normal potassium levels, per Georga Kaufmann.  Verbalized understanding.

## 2023-06-13 ENCOUNTER — Other Ambulatory Visit: Payer: Self-pay | Admitting: Hematology

## 2023-06-13 ENCOUNTER — Other Ambulatory Visit: Payer: Self-pay | Admitting: *Deleted

## 2023-06-13 NOTE — Telephone Encounter (Signed)
Anastrozole refill approved.  Patient is tolerating and is to continue therapy.

## 2023-09-20 ENCOUNTER — Other Ambulatory Visit: Payer: Self-pay

## 2023-09-20 DIAGNOSIS — Z170 Estrogen receptor positive status [ER+]: Secondary | ICD-10-CM

## 2023-09-20 DIAGNOSIS — E876 Hypokalemia: Secondary | ICD-10-CM

## 2023-09-20 NOTE — Progress Notes (Unsigned)
Labs orders entered.

## 2023-09-21 ENCOUNTER — Inpatient Hospital Stay: Payer: Medicaid Other | Attending: Hematology

## 2023-09-21 DIAGNOSIS — Z170 Estrogen receptor positive status [ER+]: Secondary | ICD-10-CM | POA: Diagnosis not present

## 2023-09-21 DIAGNOSIS — C50411 Malignant neoplasm of upper-outer quadrant of right female breast: Secondary | ICD-10-CM | POA: Insufficient documentation

## 2023-09-21 DIAGNOSIS — Z79811 Long term (current) use of aromatase inhibitors: Secondary | ICD-10-CM | POA: Diagnosis not present

## 2023-09-21 DIAGNOSIS — E876 Hypokalemia: Secondary | ICD-10-CM

## 2023-09-21 LAB — COMPREHENSIVE METABOLIC PANEL
ALT: 42 U/L (ref 0–44)
AST: 29 U/L (ref 15–41)
Albumin: 4.1 g/dL (ref 3.5–5.0)
Alkaline Phosphatase: 90 U/L (ref 38–126)
Anion gap: 10 (ref 5–15)
BUN: 13 mg/dL (ref 6–20)
CO2: 27 mmol/L (ref 22–32)
Calcium: 10 mg/dL (ref 8.9–10.3)
Chloride: 100 mmol/L (ref 98–111)
Creatinine, Ser: 0.61 mg/dL (ref 0.44–1.00)
GFR, Estimated: >60 mL/min (ref >60)
Glucose, Bld: 95 mg/dL (ref 70–99)
Potassium: 3.5 mmol/L (ref 3.5–5.1)
Sodium: 137 mmol/L (ref 135–145)
Total Bilirubin: 0.6 mg/dL (ref 0.3–1.2)
Total Protein: 8.2 g/dL — ABNORMAL HIGH (ref 6.5–8.1)

## 2023-09-21 LAB — CBC WITH DIFFERENTIAL/PLATELET
Abs Immature Granulocytes: 0.03 10*3/uL (ref 0.00–0.07)
Basophils Absolute: 0 10*3/uL (ref 0.0–0.1)
Basophils Relative: 1 %
Eosinophils Absolute: 0.1 10*3/uL (ref 0.0–0.5)
Eosinophils Relative: 2 %
HCT: 43.7 % (ref 36.0–46.0)
Hemoglobin: 14.2 g/dL (ref 12.0–15.0)
Immature Granulocytes: 0 %
Lymphocytes Relative: 42 %
Lymphs Abs: 3.2 10*3/uL (ref 0.7–4.0)
MCH: 28.9 pg (ref 26.0–34.0)
MCHC: 32.5 g/dL (ref 30.0–36.0)
MCV: 88.8 fL (ref 80.0–100.0)
Monocytes Absolute: 0.5 10*3/uL (ref 0.1–1.0)
Monocytes Relative: 7 %
Neutro Abs: 3.7 10*3/uL (ref 1.7–7.7)
Neutrophils Relative %: 48 %
Platelets: 347 10*3/uL (ref 150–400)
RBC: 4.92 MIL/uL (ref 3.87–5.11)
RDW: 12.4 % (ref 11.5–15.5)
WBC: 7.5 10*3/uL (ref 4.0–10.5)
nRBC: 0 % (ref 0.0–0.2)

## 2023-09-21 LAB — MAGNESIUM: Magnesium: 2.1 mg/dL (ref 1.7–2.4)

## 2023-09-22 LAB — VITAMIN D 25 HYDROXY (VIT D DEFICIENCY, FRACTURES): Vit D, 25-Hydroxy: 47.34 ng/mL (ref 30–100)

## 2023-09-23 ENCOUNTER — Other Ambulatory Visit: Payer: Self-pay

## 2023-09-23 ENCOUNTER — Ambulatory Visit
Admission: RE | Admit: 2023-09-23 | Discharge: 2023-09-23 | Disposition: A | Discharge status: Home / Self Care | Payer: Medicaid Other | Source: Ambulatory Visit | Attending: Family Medicine | Admitting: Family Medicine

## 2023-09-23 VITALS — BP 99/74 | HR 72 | Temp 98.5°F | Resp 20

## 2023-09-23 DIAGNOSIS — L237 Allergic contact dermatitis due to plants, except food: Secondary | ICD-10-CM

## 2023-09-23 MED ORDER — METHYLPREDNISOLONE SODIUM SUCC 125 MG IJ SOLR
80.0000 mg | Freq: Once | INTRAMUSCULAR | Status: AC
  Administered 2023-09-23: 80 mg via INTRAMUSCULAR

## 2023-09-23 MED ORDER — TRIAMCINOLONE ACETONIDE 0.1 % EX CREA: 1.0000 | TOPICAL_CREAM | Freq: Two times a day (BID) | CUTANEOUS | 0 refills | Status: AC

## 2023-09-23 NOTE — ED Provider Notes (Signed)
[facility]    CSN: [id number] Arrival date & time: 09/23/23  1358      History   Chief Complaint Chief Complaint  Patient presents with   Skin Problem    HPI Monica Hancock is a 42 y.o. female.   Patient presenting today with itchy rash that started on bilateral ankles and lower legs and is spreading now to the upper legs and arms.  She states rash started after exposure to poison ivy.  Denies new products use, new medications, new diet changes.  Has been trying over-the-counter remedies with no relief.    Past Medical History:  Diagnosis Date   BRCA1 positive    Cancer (HCC)    Right Breast   Family history of adverse reaction to anesthesia    PONV-children   Family history of BRCA1 gene positive    Family history of breast cancer    H/O Bell's palsy 2000   Left sided   HSV infection     Patient Active Problem List   Diagnosis Date Noted   Long term current use of aromatase inhibitor 06/24/2021   Hot flashes due to surgical menopause 06/24/2021   Encounter to establish care 02/25/2021   Hot flashes 02/25/2021   RBBB 02/25/2021   Shingles 02/25/2021   Status post abdominal hysterectomy 04/14/2020   S/P bilateral mastectomy 01/24/2020   Elevated alkaline phosphatase level 11/14/2019   Malignant neoplasm of upper-outer quadrant of right female breast (HCC) 08/07/2019   Breast lump on right side at 10 o'clock position 08/01/2019   Breast lump on left side at 3 o'clock position 08/01/2019   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   Family history of BRCA1 gene positive     Past Surgical History:  Procedure Laterality Date   ABDOMINAL HYSTERECTOMY N/A    Phreesia 02/25/2021   BREAST BIOPSY Right    BREAST SURGERY N/A    Phreesia 02/25/2021   IR IMAGING GUIDED PORT INSERTION  08/26/2019   Right   IR REMOVAL TUN ACCESS W/ PORT W/O FL MOD SED  04/25/2022   MASTECTOMY MODIFIED RADICAL Right 01/24/2020   Procedure: RIGHT  MASTECTOMY MODIFIED RADICAL;  Surgeon: Franky Macho, MD;  Location: [facility];  Service: General;  Laterality: Right;   SALPINGOOPHORECTOMY Bilateral 04/14/2020   Procedure: OPEN BILATERAL SALPINGO OOPHORECTOMY;  Surgeon: Tilda Burrow, MD;  Location: [facility];  Service: Gynecology;  Laterality: Bilateral;   SIMPLE MASTECTOMY WITH AXILLARY SENTINEL NODE BIOPSY Left 01/24/2020   Procedure: LEFT SIMPLE MASTECTOMY;  Surgeon: Franky Macho, MD;  Location: [facility];  Service: General;  Laterality: Left;   SUPRACERVICAL ABDOMINAL HYSTERECTOMY N/A 04/14/2020   Procedure: HYSTERECTOMY SUPRACERVICAL ABDOMINAL;  Surgeon: Tilda Burrow, MD;  Location: [facility];  Service: Gynecology;  Laterality: N/A;   TUBAL LIGATION      OB History     Gravida  5   Para  5   Term  4   Preterm  1   AB  0   Living  5      SAB  0   IAB  0   Ectopic  0   Multiple  0   Live Births  5            Home Medications    Prior to Admission medications   Medication Sig Start Date End Date Taking? Authorizing Provider  triamcinolone cream (KENALOG) 0.1 % Apply 1 Application topically 2 (two) times daily. Avoid use on face or private areas  09/23/23  Yes Particia Nearing, PA-C  anastrozole (ARIMIDEX) 1 MG tablet TAKE 1 TABLET(1 MG) BY MOUTH DAILY 06/13/23   [name], MD  Calcium Carbonate-Vit D-Min (CALCIUM 1200 PO) Take by mouth.    [provider]  fluticasone (FLONASE) 50 MCG/ACT nasal spray Place 1 spray into both nostrils 2 (two) times daily. 10/22/22   Particia Nearing, PA-C  ibuprofen (ADVIL) 600 MG tablet Take 1 tablet (600 mg total) by mouth every 6 (six) hours as needed for fever or headache. 04/16/20   Tilda Burrow, MD  loratadine (CLARITIN) 10 MG tablet Take 10 mg by mouth daily as needed for allergies.     [provider]  potassium chloride SA (KLOR-CON M) 20 MEQ tablet Take 1 tablet (20 mEq total) by mouth daily. 03/23/23   Briant Cedar, PA-C    Family  History Family History  Problem Relation Age of Onset   Diabetes Mother    Breast cancer Mother 74   Cancer Mother    Diabetes Maternal Grandmother    Stomach cancer Maternal Grandfather    Breast cancer Maternal Aunt        dx in her 87s   Diabetes Maternal Aunt     Social History Social History   Tobacco Use   Smoking status: Never   Smokeless tobacco: Never  Vaping Use   Vaping status: Never Used  Substance Use Topics   Alcohol use: No   Drug use: No     Allergies   Patient has no known allergies.   Review of Systems Review of Systems Per HPI  Physical Exam Triage Vital Signs ED Triage Vitals  Encounter Vitals Group     BP 09/23/23 1443 99/74     Systolic BP Percentile --      Diastolic BP Percentile --      Pulse Rate 09/23/23 1443 72     Resp 09/23/23 1443 20     Temp 09/23/23 1443 98.5 F (36.9 C)     Temp Source 09/23/23 1443 Oral     SpO2 09/23/23 1443 97 %     Weight --      Height --      Head Circumference --      Peak Flow --      Pain Score 09/23/23 1441 5     Pain Loc --      Pain Education --      Exclude from Growth Chart --    No data found.  Updated Vital Signs BP 99/74 (BP Location: Right Arm)   Pulse 72   Temp 98.5 F (36.9 C) (Oral)   Resp 20   LMP 09/29/2019   SpO2 97%   Visual Acuity Right Eye Distance:   Left Eye Distance:   Bilateral Distance:    Right Eye Near:   Left Eye Near:    Bilateral Near:     Physical Exam Vitals and nursing note reviewed.  Constitutional:      Appearance: Normal appearance. She is not ill-appearing.  HENT:     Head: Atraumatic.  Eyes:     Extraocular Movements: Extraocular movements intact.     Conjunctiva/sclera: Conjunctivae normal.  Cardiovascular:     Rate and Rhythm: Normal rate and regular rhythm.     Heart sounds: Normal heart sounds.  Pulmonary:     Effort: Pulmonary effort is normal.     Breath sounds: Normal breath sounds.  Musculoskeletal:  General: Normal  range of motion.     Cervical back: Normal range of motion and neck supple.  Skin:    General: Skin is warm.     Comments: Erythematous maculopapular rash, scabbed and ulcerated in certain areas from itching to lower extremities bilaterally and starting on the hands and forearms bilaterally  Neurological:     Mental Status: She is alert and oriented to person, place, and time.  Psychiatric:        Mood and Affect: Mood normal.        Thought Content: Thought content normal.        Judgment: Judgment normal.      UC Treatments / Results  Labs (all labs ordered are listed, but only abnormal results are displayed) Labs Reviewed - No data to display  EKG   Radiology No results found.  Procedures Procedures (including critical care time)  Medications Ordered in UC Medications  methylPREDNISolone sodium succinate (SOLU-MEDROL) 125 mg/2 mL injection 80 mg (has no administration in time range)    Initial Impression / Assessment and Plan / UC Course  I have reviewed the triage vital signs and the nursing notes.  Pertinent labs & imaging results that were available during my care of the patient were reviewed by me and considered in my medical decision making (see chart for details).     Treat with IM Solu-Medrol, triamcinolone as needed.  Return for worsening symptoms.  Final Clinical Impressions(s) / UC Diagnoses   Final diagnoses:  Poison ivy dermatitis   Discharge Instructions   None    ED Prescriptions     Medication Sig Dispense Auth. Provider   triamcinolone cream (KENALOG) 0.1 % Apply 1 Application topically 2 (two) times daily. Avoid use on face or private areas 60 g Particia Nearing, [place]      PDMP not reviewed this encounter.   Particia Nearing, [place] 09/23/23 1500

## 2023-09-23 NOTE — ED Triage Notes (Signed)
Pt reports possible poison ivy to BLE x1 week. Denies any changes with otc medication or creams.

## 2023-09-28 ENCOUNTER — Inpatient Hospital Stay: Payer: Medicaid Other | Attending: Hematology | Admitting: Hematology

## 2023-09-28 DIAGNOSIS — C50411 Malignant neoplasm of upper-outer quadrant of right female breast: Secondary | ICD-10-CM | POA: Diagnosis not present

## 2023-09-28 DIAGNOSIS — Z1509 Genetic susceptibility to other malignant neoplasm: Secondary | ICD-10-CM | POA: Insufficient documentation

## 2023-09-28 DIAGNOSIS — Z803 Family history of malignant neoplasm of breast: Secondary | ICD-10-CM | POA: Diagnosis not present

## 2023-09-28 DIAGNOSIS — Z90710 Acquired absence of both cervix and uterus: Secondary | ICD-10-CM | POA: Insufficient documentation

## 2023-09-28 DIAGNOSIS — Z1501 Genetic susceptibility to malignant neoplasm of breast: Secondary | ICD-10-CM | POA: Insufficient documentation

## 2023-09-28 DIAGNOSIS — Z9013 Acquired absence of bilateral breasts and nipples: Secondary | ICD-10-CM | POA: Insufficient documentation

## 2023-09-28 DIAGNOSIS — Z90722 Acquired absence of ovaries, bilateral: Secondary | ICD-10-CM | POA: Diagnosis not present

## 2023-09-28 DIAGNOSIS — Z79811 Long term (current) use of aromatase inhibitors: Secondary | ICD-10-CM | POA: Diagnosis not present

## 2023-09-28 DIAGNOSIS — Z170 Estrogen receptor positive status [ER+]: Secondary | ICD-10-CM | POA: Diagnosis not present

## 2023-09-28 NOTE — Progress Notes (Signed)
[facility] [address], [place] [address]    Clinic Day:  09/28/2023  Referring physician: [facility]  Patient Care Team: [facility] as PCP - General [name], MD as Medical Oncologist (Medical Oncology)   ASSESSMENT & PLAN:   Assessment: 1.  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 -Herceptin and Pertuzumab maintenance started on 01/02/2020. -Bilateral mastectomies on 01/24/2020.  Pathology showed YPT0Y PN 0, 0/6 lymph nodes involved. -She met with Dr. Roselind Messier who did not recommend adjuvant radiation. -TAH/BSO on 04/14/2020. -Anastrozole to start on 04/24/2020.   2.  BRCA1 positive: -Bilateral mastectomies on 01/24/2020. -TAH/BSO on 04/14/2020.    Plan: 1.  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 - She has hot flashes worse during the daytime, 5/day.  No musculoskeletal symptoms.  No new pains. - Reviewed labs today: Normal LFTs and creatinine.  CBC normal. - Physical exam: Bilateral mastectomy site is within normal limits. - I have recommended MRI of the breast given her BRCA 1 positivity. - RTC 6 months for follow-up with repeat labs and exam.   2.  Bone health: - Continue calcium and vitamin D supplements.  Vitamin D level is 47. - Discussed DEXA results from 03/08/2023: T-score -0.7, normal.   Breast Cancer therapy associated bone loss: I have recommended calcium, Vitamin D and weight bearing exercises.  Orders Placed This Encounter  Procedures   MR BREAST BILATERAL W WO CONTRAST INC CAD    Standing Status:   Future    Standing Expiration Date:   09/27/2024    Order Specific Question:   If indicated for the ordered procedure, I authorize the administration of contrast media per Radiology protocol    Answer:   Yes    Order Specific Question:    What is the patient's sedation requirement?    Answer:   No Sedation    Order Specific Question:   Does the patient have a pacemaker or implanted devices?    Answer:   No    Order Specific Question:   Preferred imaging location?    Answer:   [facility] (table limit - 550lbs)   CBC with Differential    Standing Status:   Future    Standing Expiration Date:   09/27/2024   Comprehensive metabolic panel    Standing Status:   Future    Standing Expiration Date:   09/27/2024   Cancer antigen 15-3    Standing Status:   Future    Standing Expiration Date:   09/27/2024   VITAMIN D 25 Hydroxy (Vit-D Deficiency, Fractures)    Standing Status:   Future    Standing Expiration Date:   09/27/2024      Monica Hancock,acting as a scribe for [name], MD.,have documented all relevant documentation on the behalf of [name], MD,as directed by  [name], MD while in the presence of [name], MD.   I, [name] MD, have reviewed the above documentation for accuracy and completeness, and I agree with the above.   [name], MD   11/7/20244:53 PM  CHIEF COMPLAINT:   Diagnosis: Malignant neoplasm of upper-outer quadrant of right breast in female, estrogen receptor positive    Cancer Staging  No matching staging information was found for the patient.   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 [facility])   08/27/2019 - 12/12/2019 Chemotherapy   The patient had palonosetron (ALOXI) injection 0.25 mg, 0.25 mg, Intravenous,  Once, 6 of 6 cycles Administration: 0.25 mg (08/27/2019), 0.25 mg (09/17/2019), 0.25 mg (11/19/2019), 0.25 mg (12/10/2019), 0.25 mg (10/08/2019), 0.25 mg (10/29/2019) pegfilgrastim-jmdb (FULPHILA) injection 6 mg, 6 mg,  Subcutaneous,  Once, 6 of 6 cycles Administration: 6 mg (08/29/2019), 6 mg (09/19/2019), 6 mg (11/21/2019), 6 mg (12/12/2019), 6 mg (10/10/2019), 6 mg (10/31/2019) trastuzumab (HERCEPTIN) 600 mg in sodium chloride 0.9 % 250 mL chemo infusion, 567 mg (100 % of original dose 8 mg/kg), Intravenous,  Once, 1 of 1 cycle Dose modification: 8 mg/kg (original dose 8 mg/kg, Cycle 1, Reason: Other (see comments), Comment: using up last of our Herceptin at [facility]) Administration: 600 mg (08/27/2019) CARBOplatin (PARAPLATIN) 780 mg in sodium chloride 0.9 % 250 mL chemo infusion, 780 mg (100 % of original dose 784.8 mg), Intravenous,  Once, 6 of 6 cycles Dose modification:   (original dose 784.8 mg, Cycle 1),   (original dose 784.8 mg, Cycle 2),   (original dose 784.8 mg, Cycle 5), 784.8 mg (original dose 784.8 mg, Cycle 5, Reason: Other (see comments), Comment: scr 0.8 entered),   (original dose 784.8 mg, Cycle 6),   (original dose 784.8 mg, Cycle 3),   (original dose 784.8 mg, Cycle 4) Administration: 780 mg (08/27/2019), 780 mg (09/17/2019), 780 mg (11/19/2019), 780 mg (12/10/2019), 780 mg (10/08/2019), 780 mg (10/29/2019) DOCEtaxel (TAXOTERE) 130 mg in sodium chloride 0.9 % 250 mL chemo infusion, 75 mg/m2 = 130 mg, Intravenous,  Once, 6 of 6 cycles Administration: 130 mg (08/27/2019), 130 mg (09/17/2019), 130 mg (11/19/2019), 130 mg (12/10/2019), 130 mg (10/08/2019), 130 mg (10/29/2019) pertuzumab (PERJETA) 840 mg in sodium chloride 0.9 % 250 mL chemo infusion, 840 mg, Intravenous, Once, 6 of 6 cycles Administration: 840 mg (08/27/2019), 420 mg (09/17/2019), 420 mg (11/19/2019), 420 mg (12/10/2019), 420 mg (10/08/2019), 420 mg (10/29/2019) fosaprepitant (EMEND) 150 mg, dexamethasone (DECADRON) 12 mg in sodium chloride 0.9 % 145 mL IVPB, , Intravenous,  Once, 6 of 6 cycles Administration:  (08/27/2019),  (09/17/2019),  (11/19/2019),  (12/10/2019),  (10/08/2019),  (10/29/2019) trastuzumab-dkst (OGIVRI) 450 mg in sodium chloride 0.9  % 250 mL chemo infusion, 420 mg, Intravenous,  Once, 5 of 5 cycles Administration: 450 mg (09/17/2019), 450 mg (11/19/2019), 420 mg (12/10/2019), 450 mg (10/08/2019), 450 mg (10/29/2019)  for chemotherapy treatment.    01/02/2020 - 10/06/2020 Chemotherapy   Patient is on Treatment Plan : BREAST Trastuzumab / Pertuzumab (Perjeta) q21d        INTERVAL HISTORY:   Monica Hancock is a 42 y.o. female presenting to clinic today for follow up of right breast cancer. She was last seen by PA Thayil on 03/23/23.  Since her last visit, she underwent a DEXA scan on 03/08/2023 with a T score of -0.7, which is considered normal. She presented to urgent care on 09/23/23 for poison ivy dermatitis.   Today, she states that she is doing well overall. Her appetite level is at 100%. Her energy level is at 75%. She is tolerating anastrozole well, though she reports 5 or less mild hot flashes a day, occurring at daytime . She denies any new onset pains. She is taking Calcium and Vitamin D supplements. Her PCP is now Miss Amy at [facility].  PAST MEDICAL HISTORY:   Past Medical History: Past Medical History:  Diagnosis Date   BRCA1 positive    Cancer (HCC)    Right Breast   Family history of adverse reaction to anesthesia    PONV-children   Family history of BRCA1 gene positive    Family history of breast cancer    H/O Bell's palsy 2000   Left sided   HSV infection     Surgical History: Past Surgical History:  Procedure Laterality Date   ABDOMINAL HYSTERECTOMY N/A    Phreesia 02/25/2021   BREAST BIOPSY Right    BREAST SURGERY N/A    Phreesia 02/25/2021   IR IMAGING GUIDED PORT INSERTION  08/26/2019   Right   IR REMOVAL TUN ACCESS W/ PORT W/O FL MOD SED  04/25/2022   MASTECTOMY MODIFIED RADICAL Right 01/24/2020   Procedure: RIGHT MASTECTOMY MODIFIED RADICAL;  Surgeon: Franky Macho, MD;  Location: [facility];  Service: General;  Laterality: Right;   SALPINGOOPHORECTOMY Bilateral 04/14/2020   Procedure: OPEN  BILATERAL SALPINGO OOPHORECTOMY;  Surgeon: Tilda Burrow, MD;  Location: [facility];  Service: Gynecology;  Laterality: Bilateral;   SIMPLE MASTECTOMY WITH AXILLARY SENTINEL NODE BIOPSY Left 01/24/2020   Procedure: LEFT SIMPLE MASTECTOMY;  Surgeon: Franky Macho, MD;  Location: [facility];  Service: General;  Laterality: Left;   SUPRACERVICAL ABDOMINAL HYSTERECTOMY N/A 04/14/2020   Procedure: HYSTERECTOMY SUPRACERVICAL ABDOMINAL;  Surgeon: Tilda Burrow, MD;  Location: [facility];  Service: Gynecology;  Laterality: N/A;   TUBAL LIGATION      Social History: Social History   Socioeconomic History   Marital status: Married    Spouse name: Lars Mage   Number of children: 5   Years of education: Not on file   Highest education level: 12th grade  Occupational History   Not on file  Tobacco Use   Smoking status: Never   Smokeless tobacco: Never  Vaping Use   Vaping status: Never Used  Substance and Sexual Activity   Alcohol use: No   Drug use: No   Sexual activity: Not Currently    Birth control/protection: Surgical    Comment: [facility]  Other Topics Concern   Not on file  Social History Narrative   Not on file   Social Determinants of Health   Financial Resource Strain: Low Risk  (10/06/2020)   Overall Financial Resource Strain (CARDIA)    Difficulty of Paying Living Expenses: Not hard at all  Food Insecurity: No Food Insecurity (10/06/2020)   Hunger Vital Sign    Worried About Running Out of Food in the Last Year: Never true    Ran Out of Food in the Last Year: Never true  Transportation Needs: No Transportation Needs (10/06/2020)   PRAPARE - Administrator, Civil Service (Medical): No    Lack of Transportation (Non-Medical): No  Physical Activity: Inactive (10/06/2020)   Exercise Vital Sign    Days of Exercise per Week: 0 days    Minutes of Exercise per Session: 0 min  Stress: No Stress Concern Present (10/06/2020)   [employer] of Occupational Health - Occupational  Stress Questionnaire    Feeling of Stress : Not at all  Social Connections: Moderately Isolated (10/06/2020)   Social Connection and Isolation Panel [NHANES]    Frequency of Communication with Friends and Family: More than three times a week    Frequency of Social Gatherings with Friends and Family: Three times a week    Attends Religious Services: Never  Active Member of Clubs or Organizations: No    Attends Banker Meetings: Never    Marital Status: Married  Catering manager Violence: Not At Risk (10/06/2020)   Humiliation, Afraid, Rape, and Kick questionnaire    Fear of Current or Ex-Partner: No    Emotionally Abused: No    Physically Abused: No    Sexually Abused: No    Family History: Family History  Problem Relation Age of Onset   Diabetes Mother    Breast cancer Mother 50   Cancer Mother    Diabetes Maternal Grandmother    Stomach cancer Maternal Grandfather    Breast cancer Maternal Aunt        dx in her 18s   Diabetes Maternal Aunt     Current Medications:  Current Outpatient Medications:    anastrozole (ARIMIDEX) 1 MG tablet, TAKE 1 TABLET(1 MG) BY MOUTH DAILY, Disp: 30 tablet, Rfl: 6   Calcium Carbonate-Vit D-Min (CALCIUM 1200 PO), Take by mouth., Disp: , Rfl:    fluticasone (FLONASE) 50 MCG/ACT nasal spray, Place 1 spray into both nostrils 2 (two) times daily., Disp: 16 g, Rfl: 2   ibuprofen (ADVIL) 600 MG tablet, Take 1 tablet (600 mg total) by mouth every 6 (six) hours as needed for fever or headache., Disp: 30 tablet, Rfl: 0   loratadine (CLARITIN) 10 MG tablet, Take 10 mg by mouth daily as needed for allergies. , Disp: , Rfl:    potassium chloride SA (KLOR-CON M) 20 MEQ tablet, Take 1 tablet (20 mEq total) by mouth daily., Disp: 7 tablet, Rfl: 0   triamcinolone cream (KENALOG) 0.1 %, Apply 1 Application topically 2 (two) times daily. Avoid use on face or private areas, Disp: 60 g, Rfl: 0   Allergies: No Known Allergies  REVIEW OF SYSTEMS:    Review of Systems  Constitutional:  Negative for chills, fatigue and fever.  HENT:   Negative for lump/mass, mouth sores, nosebleeds, sore throat and trouble swallowing.   Eyes:  Negative for eye problems.  Respiratory:  Negative for cough and shortness of breath.   Cardiovascular:  Negative for chest pain, leg swelling and palpitations.  Gastrointestinal:  Negative for abdominal pain, constipation, diarrhea, nausea and vomiting.  Endocrine: Positive for hot flashes.  Genitourinary:  Negative for bladder incontinence, difficulty urinating, dysuria, frequency, hematuria and nocturia.   Musculoskeletal:  Negative for arthralgias, back pain, flank pain, myalgias and neck pain.  Skin:  Negative for itching and rash.  Neurological:  Negative for dizziness, headaches and numbness.  Hematological:  Does not bruise/bleed easily.  Psychiatric/Behavioral:  Negative for depression, sleep disturbance and suicidal ideas. The patient is not nervous/anxious.   All other systems reviewed and are negative.    VITALS:   Last menstrual period 09/29/2019.  Wt Readings from Last 3 Encounters:  04/25/22 165 lb (74.8 kg)  03/08/22 166 lb 1.6 oz (75.3 kg)  11/02/21 166 lb 1.6 oz (75.3 kg)    There is no height or weight on file to calculate BMI.  Performance status (ECOG): 0 - Asymptomatic  PHYSICAL EXAM:   Physical Exam Vitals and nursing note reviewed. Exam conducted with a chaperone present.  Constitutional:      Appearance: Normal appearance.  Cardiovascular:     Rate and Rhythm: Normal rate and regular rhythm.     Pulses: Normal pulses.     Heart sounds: Normal heart sounds.  Pulmonary:     Effort: Pulmonary effort is normal.     Breath  sounds: Normal breath sounds.  Chest:     Comments: +bilateral mastectomy sites WNL +no palpable adenopathy bilaterally Abdominal:     Palpations: Abdomen is soft. There is no hepatomegaly, splenomegaly or mass.     Tenderness: There is no abdominal  tenderness.  Musculoskeletal:     Right lower leg: No edema.     Left lower leg: No edema.  Lymphadenopathy:     Cervical: No cervical adenopathy.     Right cervical: No superficial, deep or posterior cervical adenopathy.    Left cervical: No superficial, deep or posterior cervical adenopathy.     Upper Body:     Right upper body: No supraclavicular or axillary adenopathy.     Left upper body: No supraclavicular or axillary adenopathy.  Neurological:     General: No focal deficit present.     Mental Status: She is alert and oriented to person, place, and time.  Psychiatric:        Mood and Affect: Mood normal.        Behavior: Behavior normal.   Breast Exam Chaperone: Chapman Moss, RN   LABS:      Latest Ref Rng & Units 09/21/2023    2:15 PM 03/08/2023    3:14 PM 08/30/2022    2:48 PM  CBC  WBC 4.0 - 10.5 K/uL 7.5  8.5  7.0   Hemoglobin 12.0 - 15.0 g/dL 34.7  42.5  95.6   Hematocrit 36.0 - 46.0 % 43.7  40.4  41.1   Platelets 150 - 400 K/uL 347  332  316       Latest Ref Rng & Units 09/21/2023    2:15 PM 04/05/2023    3:05 PM 03/08/2023    3:14 PM  CMP  Glucose 70 - 99 mg/dL 95  387  564   BUN 6 - 20 mg/dL 13  14  19    Creatinine 0.44 - 1.00 mg/dL 3.32  9.51  8.84   Sodium 135 - 145 mmol/L 137  137  135   Potassium 3.5 - 5.1 mmol/L 3.5  3.8  3.1   Chloride 98 - 111 mmol/L 100  104  102   CO2 22 - 32 mmol/L 27  24  23    Calcium 8.9 - 10.3 mg/dL 16.6  9.7  9.8   Total Protein 6.5 - 8.1 g/dL 8.2  8.0  8.0   Total Bilirubin 0.3 - 1.2 mg/dL 0.6  0.7  0.6   Alkaline Phos 38 - 126 U/L 90  87  84   AST 15 - 41 U/L 29  32  25   ALT 0 - 44 U/L 42  47  42      No results found for: "CEA1", "CEA" / No results found for: "CEA1", "CEA" No results found for: "PSA1" No results found for: "AYT016" No results found for: "CAN125"  No results found for: "TOTALPROTELP", "ALBUMINELP", "A1GS", "A2GS", "BETS", "BETA2SER", "GAMS", "MSPIKE", "SPEI" No results found for: "TIBC",  "FERRITIN", "IRONPCTSAT" Lab Results  Component Value Date   LDH 111 07/17/2020   LDH 118 06/26/2020   LDH 144 12/10/2019     STUDIES:   No results found.

## 2023-09-28 NOTE — Progress Notes (Signed)
Patient is taking Anastrozole as prescribed.  She has not missed any doses and reports no side effects at this time.   

## 2023-09-28 NOTE — Patient Instructions (Addendum)
[facility] at [facility] Discharge Instructions   You were seen and examined today by Dr. Ellin Saba.  He reviewed the results of your lab work which were normal/stable.   We will see you back in 6 months.   We will repeat lab work prior to this visit.   Return as scheduled.    Thank you for choosing [facility] at [facility] to provide your oncology and hematology care.  To afford each patient quality time with our provider, please arrive at least 15 minutes before your scheduled appointment time.   If you have a lab appointment with the Cancer Center please come in thru the Main Entrance and check in at the main information desk.  You need to re-schedule your appointment should you arrive 10 or more minutes late.  We strive to give you quality time with our providers, and arriving late affects you and other patients whose appointments are after yours.  Also, if you no show three or more times for appointments you may be dismissed from the clinic at the providers discretion.     Again, thank you for choosing [facility].  Our hope is that these requests will decrease the amount of time that you wait before being seen by our physicians.       _____________________________________________________________  Should you have questions after your visit to [facility], please contact our office at [phone] and follow the prompts.  Our office hours are 8:00 a.m. and 4:30 p.m. Monday - Friday.  Please note that voicemails left after 4:00 p.m. may not be returned until the following business day.  We are closed weekends and major holidays.  You do have access to a nurse 24-7, just call the main number to the clinic [phone] and do not press any options, hold on the line and a nurse will answer the phone.    For prescription refill requests, have your pharmacy contact our office and allow 72 hours.    Due to Covid, you  will need to wear a mask upon entering the hospital. If you do not have a mask, a mask will be given to you at the Main Entrance upon arrival. For doctor visits, patients may have 1 support person age 65 or older with them. For treatment visits, patients can not have anyone with them due to social distancing guidelines and our immunocompromised population.

## 2023-11-13 ENCOUNTER — Ambulatory Visit (HOSPITAL_COMMUNITY): Admission: RE | Admit: 2023-11-13 | Payer: Medicaid Other | Source: Ambulatory Visit

## 2023-11-17 ENCOUNTER — Ambulatory Visit (HOSPITAL_COMMUNITY)
Admission: RE | Admit: 2023-11-17 | Discharge: 2023-11-17 | Disposition: A | Discharge status: Home / Self Care | Payer: Medicaid Other | Source: Ambulatory Visit | Attending: Hematology | Admitting: Hematology

## 2023-11-17 DIAGNOSIS — C50411 Malignant neoplasm of upper-outer quadrant of right female breast: Secondary | ICD-10-CM | POA: Diagnosis present

## 2023-11-17 DIAGNOSIS — Z170 Estrogen receptor positive status [ER+]: Secondary | ICD-10-CM | POA: Insufficient documentation

## 2023-11-17 MED ORDER — GADOBUTROL 1 MMOL/ML IV SOLN
7.0000 mL | Freq: Once | INTRAVENOUS | Status: AC | PRN
  Administered 2023-11-17: 7 mL via INTRAVENOUS

## 2023-11-20 ENCOUNTER — Other Ambulatory Visit: Payer: Self-pay | Admitting: Hematology

## 2024-04-04 ENCOUNTER — Inpatient Hospital Stay: Payer: Medicaid Other

## 2024-04-11 ENCOUNTER — Inpatient Hospital Stay: Payer: Medicaid Other | Admitting: Oncology

## 2024-05-10 ENCOUNTER — Inpatient Hospital Stay: Attending: Hematology

## 2024-05-10 DIAGNOSIS — R232 Flushing: Secondary | ICD-10-CM | POA: Diagnosis not present

## 2024-05-10 DIAGNOSIS — Z90710 Acquired absence of both cervix and uterus: Secondary | ICD-10-CM | POA: Diagnosis not present

## 2024-05-10 DIAGNOSIS — Z90722 Acquired absence of ovaries, bilateral: Secondary | ICD-10-CM | POA: Insufficient documentation

## 2024-05-10 DIAGNOSIS — Z1501 Genetic susceptibility to malignant neoplasm of breast: Secondary | ICD-10-CM | POA: Insufficient documentation

## 2024-05-10 DIAGNOSIS — Z1509 Genetic susceptibility to other malignant neoplasm: Secondary | ICD-10-CM | POA: Insufficient documentation

## 2024-05-10 DIAGNOSIS — Z170 Estrogen receptor positive status [ER+]: Secondary | ICD-10-CM | POA: Insufficient documentation

## 2024-05-10 DIAGNOSIS — M8580 Other specified disorders of bone density and structure, unspecified site: Secondary | ICD-10-CM | POA: Diagnosis not present

## 2024-05-10 DIAGNOSIS — Z79811 Long term (current) use of aromatase inhibitors: Secondary | ICD-10-CM | POA: Diagnosis not present

## 2024-05-10 DIAGNOSIS — C50411 Malignant neoplasm of upper-outer quadrant of right female breast: Secondary | ICD-10-CM | POA: Insufficient documentation

## 2024-05-10 DIAGNOSIS — Z800 Family history of malignant neoplasm of digestive organs: Secondary | ICD-10-CM | POA: Diagnosis not present

## 2024-05-10 DIAGNOSIS — Z9013 Acquired absence of bilateral breasts and nipples: Secondary | ICD-10-CM | POA: Insufficient documentation

## 2024-05-10 DIAGNOSIS — Z1731 Human epidermal growth factor receptor 2 positive status: Secondary | ICD-10-CM | POA: Insufficient documentation

## 2024-05-10 DIAGNOSIS — Z803 Family history of malignant neoplasm of breast: Secondary | ICD-10-CM | POA: Diagnosis not present

## 2024-05-10 LAB — CBC WITH DIFFERENTIAL/PLATELET
Abs Immature Granulocytes: 0.01 10*3/uL (ref 0.00–0.07)
Basophils Absolute: 0 10*3/uL (ref 0.0–0.1)
Basophils Relative: 1 %
Eosinophils Absolute: 0.2 10*3/uL (ref 0.0–0.5)
Eosinophils Relative: 2 %
HCT: 43.3 % (ref 36.0–46.0)
Hemoglobin: 14.3 g/dL (ref 12.0–15.0)
Immature Granulocytes: 0 %
Lymphocytes Relative: 48 %
Lymphs Abs: 3.6 10*3/uL (ref 0.7–4.0)
MCH: 29.4 pg (ref 26.0–34.0)
MCHC: 33 g/dL (ref 30.0–36.0)
MCV: 89.1 fL (ref 80.0–100.0)
Monocytes Absolute: 0.4 10*3/uL (ref 0.1–1.0)
Monocytes Relative: 6 %
Neutro Abs: 3.2 10*3/uL (ref 1.7–7.7)
Neutrophils Relative %: 43 %
Platelets: 334 10*3/uL (ref 150–400)
RBC: 4.86 MIL/uL (ref 3.87–5.11)
RDW: 12.5 % (ref 11.5–15.5)
WBC: 7.5 10*3/uL (ref 4.0–10.5)
nRBC: 0 % (ref 0.0–0.2)

## 2024-05-10 LAB — COMPREHENSIVE METABOLIC PANEL WITH GFR
ALT: 32 U/L (ref 0–44)
AST: 21 U/L (ref 15–41)
Albumin: 3.9 g/dL (ref 3.5–5.0)
Alkaline Phosphatase: 86 U/L (ref 38–126)
Anion gap: 11 (ref 5–15)
BUN: 15 mg/dL (ref 6–20)
CO2: 26 mmol/L (ref 22–32)
Calcium: 10.3 mg/dL (ref 8.9–10.3)
Chloride: 103 mmol/L (ref 98–111)
Creatinine, Ser: 0.69 mg/dL (ref 0.44–1.00)
GFR, Estimated: >60 mL/min (ref >60)
Glucose, Bld: 100 mg/dL — ABNORMAL HIGH (ref 70–99)
Potassium: 3.8 mmol/L (ref 3.5–5.1)
Sodium: 140 mmol/L (ref 135–145)
Total Bilirubin: 0.9 mg/dL (ref 0.0–1.2)
Total Protein: 7.7 g/dL (ref 6.5–8.1)

## 2024-05-10 LAB — VITAMIN D 25 HYDROXY (VIT D DEFICIENCY, FRACTURES): Vit D, 25-Hydroxy: 39.3 ng/mL (ref 30–100)

## 2024-05-11 LAB — CANCER ANTIGEN 15-3: CA 15-3: 21.6 U/mL (ref 0.0–25.0)

## 2024-05-17 ENCOUNTER — Inpatient Hospital Stay (HOSPITAL_BASED_OUTPATIENT_CLINIC_OR_DEPARTMENT_OTHER): Admitting: Oncology

## 2024-05-17 VITALS — BP 115/74 | HR 60 | Temp 97.5°F | Resp 16 | Wt 167.3 lb

## 2024-05-17 DIAGNOSIS — Z170 Estrogen receptor positive status [ER+]: Secondary | ICD-10-CM

## 2024-05-17 DIAGNOSIS — C50411 Malignant neoplasm of upper-outer quadrant of right female breast: Secondary | ICD-10-CM

## 2024-05-17 NOTE — Progress Notes (Signed)
 [facility] [address], [place] [address]    Clinic Day:  05/17/2024  Referring physician: [facility]  Patient Care Team: [facility] as PCP - General Rogers Hai, MD as Medical Oncologist (Medical Oncology)   ASSESSMENT & PLAN:   Assessment: 1.  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 -Herceptin  and Pertuzumab  maintenance started on 01/02/2020. -Bilateral mastectomies on 01/24/2020.  Pathology showed YPT0Y PN 0, 0/6 lymph nodes involved. -She met with Dr. Shannon who did not recommend adjuvant radiation. -TAH/BSO on 04/14/2020. -Anastrozole  to start on 04/24/2020.   2.  BRCA1 positive: -Bilateral mastectomies on 01/24/2020. -TAH/BSO on 04/14/2020.    Plan: 1.  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 - She has hot flashes worse during the daytime, 5/day.  No musculoskeletal symptoms.  No new pains. - Reviewed labs today: Normal LFTs and creatinine.  CBC normal.  CA 15-3 is WNL. - Physical exam: Bilateral mastectomy site is within normal limits. - Bilateral MRI on 11/17/2023 was read as BI-RADS Category 1 negative.  -Will reach out to Dr. Rogers to see if she needs annual MRIs or if we can continue to monitor with labs and breast exam only.  Of course, if she had any concerns, we could get imaging at that point.  I would also recommend getting BCI testing completed in the next couple of months given she will complete 5 years of AI treatment in June 2026. - RTC 6 months for follow-up with repeat labs and exam.    2.  Bone health: - Continue calcium and vitamin D  supplements.  Vitamin D  level is 39.30. - Discussed DEXA results from 03/08/2023: T-score -0.7, normal.   Breast Cancer therapy associated bone loss: I have recommended calcium, Vitamin D  and  weight bearing exercises.  Orders Placed This Encounter  Procedures   MR BREAST BILATERAL W WO CONTRAST INC CAD    Standing Status:   Future    Expected Date:   11/16/2024    Expiration Date:   05/17/2025    If indicated for the ordered procedure, I authorize the administration of contrast media per Radiology protocol:   Yes    What is the patient's sedation requirement?:   No Sedation    Does the patient have a pacemaker or implanted devices?:   No    Preferred imaging location?:   [facility] (table limit - 500lbs)   CBC with Differential    Standing Status:   Future    Expected Date:   11/16/2024    Expiration Date:   05/17/2025   Comprehensive metabolic panel    Standing Status:   Future    Expected Date:   11/16/2024    Expiration Date:   05/17/2025   Cancer antigen 15-3    Standing Status:   Future    Expected Date:   11/16/2024    Expiration Date:   05/17/2025   VITAMIN D  25 Hydroxy (Vit-D Deficiency, Fractures)    Standing Status:   Future    Expected Date:   11/16/2024    Expiration Date:   05/17/2025   Delon FORBES Hope, NP   6/27/20259:58 AM  CHIEF COMPLAINT:   Diagnosis: Malignant neoplasm of upper-outer quadrant of right breast in female, estrogen receptor positive    Cancer Staging  No matching staging information  was found for the patient.   HISTORY OF PRESENT ILLNESS:   Oncology History  Malignant neoplasm of upper-outer quadrant of right female breast (HCC)  08/07/2019 Initial Diagnosis   Infiltrating ductal carcinoma of upper-outer quadrant of right breast in female [facility])   08/27/2019 - 12/12/2019 Chemotherapy   The patient had palonosetron  (ALOXI ) injection 0.25 mg, 0.25 mg, Intravenous,  Once, 6 of 6 cycles Administration: 0.25 mg (08/27/2019), 0.25 mg (09/17/2019), 0.25 mg (11/19/2019), 0.25 mg (12/10/2019), 0.25 mg (10/08/2019), 0.25 mg (10/29/2019) pegfilgrastim -jmdb (FULPHILA ) injection 6 mg, 6 mg, Subcutaneous,  Once, 6 of 6  cycles Administration: 6 mg (08/29/2019), 6 mg (09/19/2019), 6 mg (11/21/2019), 6 mg (12/12/2019), 6 mg (10/10/2019), 6 mg (10/31/2019) trastuzumab  (HERCEPTIN ) 600 mg in sodium chloride  0.9 % 250 mL chemo infusion, 567 mg (100 % of original dose 8 mg/kg), Intravenous,  Once, 1 of 1 cycle Dose modification: 8 mg/kg (original dose 8 mg/kg, Cycle 1, Reason: Other (see comments), Comment: using up last of our Herceptin  at [facility]) Administration: 600 mg (08/27/2019) CARBOplatin  (PARAPLATIN ) 780 mg in sodium chloride  0.9 % 250 mL chemo infusion, 780 mg (100 % of original dose 784.8 mg), Intravenous,  Once, 6 of 6 cycles Dose modification:   (original dose 784.8 mg, Cycle 1),   (original dose 784.8 mg, Cycle 2),   (original dose 784.8 mg, Cycle 5), 784.8 mg (original dose 784.8 mg, Cycle 5, Reason: Other (see comments), Comment: scr 0.8 entered),   (original dose 784.8 mg, Cycle 6),   (original dose 784.8 mg, Cycle 3),   (original dose 784.8 mg, Cycle 4) Administration: 780 mg (08/27/2019), 780 mg (09/17/2019), 780 mg (11/19/2019), 780 mg (12/10/2019), 780 mg (10/08/2019), 780 mg (10/29/2019) DOCEtaxel  (TAXOTERE ) 130 mg in sodium chloride  0.9 % 250 mL chemo infusion, 75 mg/m2 = 130 mg, Intravenous,  Once, 6 of 6 cycles Administration: 130 mg (08/27/2019), 130 mg (09/17/2019), 130 mg (11/19/2019), 130 mg (12/10/2019), 130 mg (10/08/2019), 130 mg (10/29/2019) pertuzumab  (PERJETA ) 840 mg in sodium chloride  0.9 % 250 mL chemo infusion, 840 mg, Intravenous, Once, 6 of 6 cycles Administration: 840 mg (08/27/2019), 420 mg (09/17/2019), 420 mg (11/19/2019), 420 mg (12/10/2019), 420 mg (10/08/2019), 420 mg (10/29/2019) fosaprepitant  (EMEND) 150 mg, dexamethasone  (DECADRON ) 12 mg in sodium chloride  0.9 % 145 mL IVPB, , Intravenous,  Once, 6 of 6 cycles Administration:  (08/27/2019),  (09/17/2019),  (11/19/2019),  (12/10/2019),  (10/08/2019),  (10/29/2019) trastuzumab -dkst (OGIVRI ) 450 mg in sodium chloride  0.9 % 250 mL chemo infusion,  420 mg, Intravenous,  Once, 5 of 5 cycles Administration: 450 mg (09/17/2019), 450 mg (11/19/2019), 420 mg (12/10/2019), 450 mg (10/08/2019), 450 mg (10/29/2019)  for chemotherapy treatment.    01/02/2020 - 10/06/2020 Chemotherapy   Patient is on Treatment Plan : BREAST Trastuzumab  / Pertuzumab  (Perjeta ) q21d        INTERVAL HISTORY:   Monica Hancock is a 43 y.o. female presenting to clinic today for follow up of right breast cancer. She was last seen by Dr. Rogers on 09/28/2023.  She underwent a DEXA scan on 03/08/2023 with a T score of -0.7, which is considered normal.   In the interim, she denies any hospitalizations or surgeries.  There are no changes to her baseline health.  She had a breast MRI on 11/17/2023 which was BI-RADS Category 1 negative.  She does not wish to have breast MRIs annually because they are expensive  Today, she states that she is doing well overall. Her appetite level is at 100%. Her energy level is  at 75%. She is tolerating anastrozole  well, though she reports 5 or less mild hot flashes a day and 1:59 at night.  Reports hot flashes have improved dramatically over the past year or so.  She has not had to take any medications for her hot flashes. She denies any new onset pains. She is taking Calcium and Vitamin D  supplements. Her PCP is now [facility] at [facility].   PAST MEDICAL HISTORY:   Past Medical History: Past Medical History:  Diagnosis Date   BRCA1 positive    Cancer (HCC)    Right Breast   Family history of adverse reaction to anesthesia    PONV-children   Family history of BRCA1 gene positive    Family history of breast cancer    H/O Bell's palsy 2000   Left sided   HSV infection     Surgical History: Past Surgical History:  Procedure Laterality Date   ABDOMINAL HYSTERECTOMY N/A    [employer]reesia 02/25/2021   BREAST BIOPSY Right    BREAST SURGERY N/A    [employer]reesia 02/25/2021   IR IMAGING GUIDED PORT INSERTION  08/26/2019   Right   IR REMOVAL TUN  ACCESS W/ PORT W/O FL MOD SED  04/25/2022   MASTECTOMY MODIFIED RADICAL Right 01/24/2020   Procedure: RIGHT MASTECTOMY MODIFIED RADICAL;  Surgeon: Mavis Anes, MD;  Location: [facility];  Service: General;  Laterality: Right;   SALPINGOOPHORECTOMY Bilateral 04/14/2020   Procedure: OPEN BILATERAL SALPINGO OOPHORECTOMY;  Surgeon: Edsel Norleen GAILS, MD;  Location: [facility];  Service: Gynecology;  Laterality: Bilateral;   SIMPLE MASTECTOMY WITH AXILLARY SENTINEL NODE BIOPSY Left 01/24/2020   Procedure: LEFT SIMPLE MASTECTOMY;  Surgeon: Mavis Anes, MD;  Location: [facility];  Service: General;  Laterality: Left;   SUPRACERVICAL ABDOMINAL HYSTERECTOMY N/A 04/14/2020   Procedure: HYSTERECTOMY SUPRACERVICAL ABDOMINAL;  Surgeon: Edsel Norleen GAILS, MD;  Location: [facility];  Service: Gynecology;  Laterality: N/A;   TUBAL LIGATION      Social History: Social History   Socioeconomic History   Marital status: Married    Spouse name: Curlee   Number of children: 5   Years of education: Not on file   Highest education level: 12th grade  Occupational History   Not on file  Tobacco Use   Smoking status: Never   Smokeless tobacco: Never  Vaping Use   Vaping status: Never Used  Substance and Sexual Activity   Alcohol use: No   Drug use: No   Sexual activity: Not Currently    Birth control/protection: Surgical    Comment: [facility]  Other Topics Concern   Not on file  Social History Narrative   Not on file   Social Drivers of Health   Financial Resource Strain: Low Risk  (10/06/2020)   Overall Financial Resource Strain (CARDIA)    Difficulty of Paying Living Expenses: Not hard at all  Food Insecurity: No Food Insecurity (10/06/2020)   Hunger Vital Sign    Worried About Running Out of Food in the Last Year: Never true    Ran Out of Food in the Last Year: Never true  Transportation Needs: No Transportation Needs (10/06/2020)   PRAPARE - Administrator, Civil Service (Medical): No    Lack of  Transportation (Non-Medical): No  Physical Activity: Inactive (10/06/2020)   Exercise Vital Sign    Days of Exercise per Week: 0 days    Minutes of Exercise per Session: 0 min  Stress: No Stress Concern Present (10/06/2020)  [employer] of Occupational Health - Occupational Stress Questionnaire    Feeling of Stress : Not at all  Social Connections: Moderately Isolated (10/06/2020)   Social Connection and Isolation Panel    Frequency of Communication with Friends and Family: More than three times a week    Frequency of Social Gatherings with Friends and Family: Three times a week    Attends Religious Services: Never    Active Member of Clubs or Organizations: No    Attends Banker Meetings: Never    Marital Status: Married  Catering manager Violence: Not At Risk (10/06/2020)   Humiliation, Afraid, Rape, and Kick questionnaire    Fear of Current or Ex-Partner: No    Emotionally Abused: No    Physically Abused: No    Sexually Abused: No    Family History: Family History  Problem Relation Age of Onset   Diabetes Mother    Breast cancer Mother 37   Cancer Mother    Diabetes Maternal Grandmother    Stomach cancer Maternal Grandfather    Breast cancer Maternal Aunt        dx in her 52s   Diabetes Maternal Aunt     Current Medications:  Current Outpatient Medications:    anastrozole  (ARIMIDEX ) 1 MG tablet, TAKE 1 TABLET(1 MG) BY MOUTH DAILY, Disp: 30 tablet, Rfl: 6   Calcium Carbonate-Vit D-Min (CALCIUM 1200 PO), Take by mouth., Disp: , Rfl:    fluticasone  (FLONASE ) 50 MCG/ACT nasal spray, Place 1 spray into both nostrils 2 (two) times daily., Disp: 16 g, Rfl: 2   ibuprofen  (ADVIL ) 600 MG tablet, Take 1 tablet (600 mg total) by mouth every 6 (six) hours as needed for fever or headache., Disp: 30 tablet, Rfl: 0   loratadine (CLARITIN) 10 MG tablet, Take 10 mg by mouth daily as needed for allergies. , Disp: , Rfl:    potassium chloride  SA (KLOR-CON  M) 20 MEQ  tablet, Take 1 tablet (20 mEq total) by mouth daily., Disp: 7 tablet, Rfl: 0   triamcinolone  cream (KENALOG ) 0.1 %, Apply 1 Application topically 2 (two) times daily. Avoid use on face or private areas, Disp: 60 g, Rfl: 0   Allergies: No Known Allergies  REVIEW OF SYSTEMS:   Review of Systems  Endocrine: Positive for hot flashes.     VITALS:   Blood pressure 115/74, pulse 60, temperature (!) 97.5 F (36.4 C), temperature source Oral, resp. rate 16, weight 167 lb 5.3 oz (75.9 kg), last menstrual period 09/29/2019, SpO2 99%.  Wt Readings from Last 3 Encounters:  05/17/24 167 lb 5.3 oz (75.9 kg)  04/25/22 165 lb (74.8 kg)  03/08/22 166 lb 1.6 oz (75.3 kg)    Body mass index is 36.21 kg/m.  Performance status (ECOG): 0 - Asymptomatic  PHYSICAL EXAM:   Physical Exam Constitutional:      Appearance: Normal appearance.   Cardiovascular:     Rate and Rhythm: Normal rate and regular rhythm.  Pulmonary:     Effort: Pulmonary effort is normal.     Breath sounds: Normal breath sounds.  Chest:  Breasts:    Right: Absent.     Left: Absent.     Comments: Bilateral mastectomy sites WNL.  No lymphadenopathy. Abdominal:     General: Bowel sounds are normal.     Palpations: Abdomen is soft.   Musculoskeletal:        General: No swelling. Normal range of motion.   Neurological:     Mental Status: She  is alert and oriented to person, place, and time. Mental status is at baseline.     LABS:      Latest Ref Rng & Units 05/10/2024    9:24 AM 09/21/2023    2:15 PM 03/08/2023    3:14 PM  CBC  WBC 4.0 - 10.5 K/uL 7.5  7.5  8.5   Hemoglobin 12.0 - 15.0 g/dL 85.6  85.7  86.5   Hematocrit 36.0 - 46.0 % 43.3  43.7  40.4   Platelets 150 - 400 K/uL 334  347  332       Latest Ref Rng & Units 05/10/2024    9:24 AM 09/21/2023    2:15 PM 04/05/2023    3:05 PM  CMP  Glucose 70 - 99 mg/dL 899  95  888   BUN 6 - 20 mg/dL 15  13  14    Creatinine 0.44 - 1.00 mg/dL 9.30  9.38  9.29    Sodium 135 - 145 mmol/L 140  137  137   Potassium 3.5 - 5.1 mmol/L 3.8  3.5  3.8   Chloride 98 - 111 mmol/L 103  100  104   CO2 22 - 32 mmol/L 26  27  24    Calcium 8.9 - 10.3 mg/dL 89.6  89.9  9.7   Total Protein 6.5 - 8.1 g/dL 7.7  8.2  8.0   Total Bilirubin 0.0 - 1.2 mg/dL 0.9  0.6  0.7   Alkaline Phos 38 - 126 U/L 86  90  87   AST 15 - 41 U/L 21  29  32   ALT 0 - 44 U/L 32  42  47      No results found for: CEA1, CEA / No results found for: CEA1, CEA No results found for: PSA1 No results found for: CAN199 No results found for: CAN125  No results found for: TOTALPROTELP, ALBUMINELP, A1GS, A2GS, BETS, BETA2SER, GAMS, MSPIKE, SPEI No results found for: TIBC, FERRITIN, IRONPCTSAT Lab Results  Component Value Date   LDH 111 07/17/2020   LDH 118 06/26/2020   LDH 144 12/10/2019     STUDIES:   No results found.

## 2024-05-22 ENCOUNTER — Encounter (INDEPENDENT_AMBULATORY_CARE_PROVIDER_SITE_OTHER): Payer: Self-pay | Admitting: *Deleted

## 2024-06-11 ENCOUNTER — Telehealth: Payer: Self-pay | Admitting: Oncology

## 2024-06-11 NOTE — Telephone Encounter (Signed)
 Re: BCI Testing  Discussed with Dr. [name] that given she is high risk given BRCA positive, would recommend continuing anastrozole  for a total of 10 years.  She will complete 5 years of treatment with anastrozole  in June 2026.  Patient would like to forego annual MRIs if possible.  She reports they are very expensive.  Delon Hope, NP 06/11/2024 3:46 PM

## 2024-08-19 ENCOUNTER — Encounter: Payer: Self-pay | Admitting: *Deleted

## 2024-11-11 ENCOUNTER — Inpatient Hospital Stay: Attending: Oncology

## 2024-11-11 ENCOUNTER — Inpatient Hospital Stay

## 2024-11-11 DIAGNOSIS — C50411 Malignant neoplasm of upper-outer quadrant of right female breast: Secondary | ICD-10-CM | POA: Insufficient documentation

## 2024-11-11 DIAGNOSIS — Z1721 Progesterone receptor positive status: Secondary | ICD-10-CM | POA: Diagnosis not present

## 2024-11-11 DIAGNOSIS — Z170 Estrogen receptor positive status [ER+]: Secondary | ICD-10-CM | POA: Insufficient documentation

## 2024-11-11 DIAGNOSIS — Z79811 Long term (current) use of aromatase inhibitors: Secondary | ICD-10-CM | POA: Diagnosis not present

## 2024-11-11 LAB — CBC WITH DIFFERENTIAL/PLATELET
Abs Immature Granulocytes: 0.02 K/uL (ref 0.00–0.07)
Basophils Absolute: 0 K/uL (ref 0.0–0.1)
Basophils Relative: 0 %
Eosinophils Absolute: 0.1 K/uL (ref 0.0–0.5)
Eosinophils Relative: 2 %
HCT: 43.2 % (ref 36.0–46.0)
Hemoglobin: 14.3 g/dL (ref 12.0–15.0)
Immature Granulocytes: 0 %
Lymphocytes Relative: 44 %
Lymphs Abs: 3.3 K/uL (ref 0.7–4.0)
MCH: 28.9 pg (ref 26.0–34.0)
MCHC: 33.1 g/dL (ref 30.0–36.0)
MCV: 87.3 fL (ref 80.0–100.0)
Monocytes Absolute: 0.5 K/uL (ref 0.1–1.0)
Monocytes Relative: 6 %
Neutro Abs: 3.5 K/uL (ref 1.7–7.7)
Neutrophils Relative %: 48 %
Platelets: 333 K/uL (ref 150–400)
RBC: 4.95 MIL/uL (ref 3.87–5.11)
RDW: 12.5 % (ref 11.5–15.5)
WBC: 7.4 K/uL (ref 4.0–10.5)
nRBC: 0 % (ref 0.0–0.2)

## 2024-11-11 LAB — VITAMIN D 25 HYDROXY (VIT D DEFICIENCY, FRACTURES): Vit D, 25-Hydroxy: 42.4 ng/mL (ref 30–100)

## 2024-11-11 LAB — COMPREHENSIVE METABOLIC PANEL WITH GFR
ALT: 35 U/L (ref 0–44)
AST: 25 U/L (ref 15–41)
Albumin: 4.4 g/dL (ref 3.5–5.0)
Alkaline Phosphatase: 105 U/L (ref 38–126)
Anion gap: 8 (ref 5–15)
BUN: 16 mg/dL (ref 6–20)
CO2: 29 mmol/L (ref 22–32)
Calcium: 10.4 mg/dL — ABNORMAL HIGH (ref 8.9–10.3)
Chloride: 104 mmol/L (ref 98–111)
Creatinine, Ser: 0.71 mg/dL (ref 0.44–1.00)
GFR, Estimated: >60 mL/min
Glucose, Bld: 114 mg/dL — ABNORMAL HIGH (ref 70–99)
Potassium: 4 mmol/L (ref 3.5–5.1)
Sodium: 140 mmol/L (ref 135–145)
Total Bilirubin: 0.5 mg/dL (ref 0.0–1.2)
Total Protein: 7.7 g/dL (ref 6.5–8.1)

## 2024-11-12 LAB — CANCER ANTIGEN 15-3: CA 15-3: 21.7 U/mL (ref 0.0–25.0)

## 2024-11-15 ENCOUNTER — Inpatient Hospital Stay

## 2024-11-18 ENCOUNTER — Encounter: Payer: Self-pay | Admitting: *Deleted

## 2024-11-22 ENCOUNTER — Inpatient Hospital Stay: Attending: Oncology | Admitting: Oncology

## 2024-11-22 VITALS — BP 107/81 | HR 108 | Temp 99.0°F | Resp 18 | Wt 170.0 lb

## 2024-11-22 DIAGNOSIS — Z9013 Acquired absence of bilateral breasts and nipples: Secondary | ICD-10-CM | POA: Insufficient documentation

## 2024-11-22 DIAGNOSIS — Z1501 Genetic susceptibility to malignant neoplasm of breast: Secondary | ICD-10-CM | POA: Insufficient documentation

## 2024-11-22 DIAGNOSIS — Z9221 Personal history of antineoplastic chemotherapy: Secondary | ICD-10-CM | POA: Insufficient documentation

## 2024-11-22 DIAGNOSIS — Z79811 Long term (current) use of aromatase inhibitors: Secondary | ICD-10-CM | POA: Diagnosis not present

## 2024-11-22 DIAGNOSIS — C50411 Malignant neoplasm of upper-outer quadrant of right female breast: Secondary | ICD-10-CM | POA: Diagnosis present

## 2024-11-22 DIAGNOSIS — Z853 Personal history of malignant neoplasm of breast: Secondary | ICD-10-CM | POA: Diagnosis not present

## 2024-11-22 DIAGNOSIS — Z1502 Genetic susceptibility to malignant neoplasm of ovary: Secondary | ICD-10-CM | POA: Diagnosis not present

## 2024-11-22 DIAGNOSIS — Z1505 Genetic susceptibility to malignant neoplasm of fallopian tube(s): Secondary | ICD-10-CM | POA: Insufficient documentation

## 2024-11-22 DIAGNOSIS — Z170 Estrogen receptor positive status [ER+]: Secondary | ICD-10-CM | POA: Diagnosis not present

## 2024-11-22 DIAGNOSIS — Z1731 Human epidermal growth factor receptor 2 positive status: Secondary | ICD-10-CM | POA: Diagnosis not present

## 2024-11-22 NOTE — Assessment & Plan Note (Addendum)
-   She is tolerating anastrozole  reasonably well. - She has hot flashes worse during the daytime, 5/day.  No musculoskeletal symptoms.  No new pains. - Reviewed labs today: Normal LFTs and creatinine.  CBC normal.  CA 15-3 is WNL. - Physical exam: Bilateral mastectomy site is within normal limits. - Bilateral MRI on 11/17/2023 was read as BI-RADS Category 1 negative.  -Discussed at last visit she will need antihormone treatment for 10 years given high risk of recurrence based on BRCA gene mutation. -She will complete 5 years of hormone treatment in June 2026 but we will continue for 10 years (June 2031).  - RTC 6 months for follow-up with repeat labs and exam.

## 2024-11-22 NOTE — Progress Notes (Signed)
 "  [facility] OFFICE PROGRESS NOTE  [name], PA-C  ASSESSMENT & PLAN:    Assessment & Plan History of right breast cancer - She is tolerating anastrozole  reasonably well. - She has hot flashes worse during the daytime, 5/day.  No musculoskeletal symptoms.  No new pains. - Reviewed labs today: Normal LFTs and creatinine.  CBC normal.  CA 15-3 is WNL. - Physical exam: Bilateral mastectomy site is within normal limits. - Bilateral MRI on 11/17/2023 was read as BI-RADS Category 1 negative.  -Discussed at last visit she will need antihormone treatment for 10 years given high risk of recurrence based on BRCA gene mutation. -She will complete 5 years of hormone treatment in June 2026 but we will continue for 10 years (June 2031).  - RTC 6 months for follow-up with repeat labs and exam. Long term current use of aromatase inhibitor Most recent bone density is from 03/08/2023 which showed a T-score of -0.7. Given she will be on an AI long-term, would recommend repeating in 2 years.  This will be due in April 2026. We will go ahead and get this scheduled and discussed at her next visit. Hypercalcemia Patient has elevated calcium level on most recent lab draw. We discussed monitoring this level.  Please discontinue all calcium and vitamin D  and we will recheck your labs in 1 month.   Orders Placed This Encounter  Procedures   DG Bone Density    Standing Status:   Future    Expected Date:   02/20/2025    Expiration Date:   11/22/2025    Reason for Exam (SYMPTOM  OR DIAGNOSIS REQUIRED):   on AI    Is patient pregnant?:   No    Preferred imaging location?:   [facility]    Release to patient:   Immediate   CBC with Differential    Standing Status:   Future    Expected Date:   05/19/2025    Expiration Date:   11/22/2025   Comprehensive metabolic panel    Standing Status:   Future    Expected Date:   05/19/2025    Expiration Date:   11/22/2025   Cancer antigen 15-3     Standing Status:   Future    Expected Date:   05/19/2025    Expiration Date:   11/22/2025   VITAMIN D  25 Hydroxy (Vit-D Deficiency, Fractures)    Standing Status:   Future    Expected Date:   05/19/2025    Expiration Date:   11/22/2025   PTH, intact and calcium    Standing Status:   Future    Expected Date:   12/23/2024    Expiration Date:   03/23/2025   Comprehensive metabolic panel    Standing Status:   Future    Expected Date:   12/23/2024    Expiration Date:   03/23/2025    INTERVAL HISTORY: Patient returns for follow-up for history of right breast cancer.  Patient denies any hospitalizations or surgeries.  No changes to baseline health.  Patient most recently had breast MRI on 11/17/2023 which did not reveal any evidence of malignancy.  We discussed at her last visit that MRIs are expensive and she would like to forego these moving forward.  She is okay with breast exams frequently.  She denies any new breast concerns.  We reviewed CBC, CMP, CA 15-3 and vitamin D .  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 [facility])   08/27/2019 - 12/12/2019 Chemotherapy   The patient had palonosetron  (ALOXI ) injection 0.25 mg, 0.25 mg, Intravenous,  Once, 6 of 6 cycles Administration: 0.25 mg (08/27/2019), 0.25 mg (09/17/2019), 0.25 mg (11/19/2019), 0.25 mg (12/10/2019), 0.25 mg (10/08/2019), 0.25 mg (10/29/2019) pegfilgrastim -jmdb (FULPHILA ) injection 6 mg, 6 mg, Subcutaneous,  Once, 6 of 6 cycles Administration: 6 mg (08/29/2019), 6 mg (09/19/2019), 6 mg (11/21/2019), 6 mg (12/12/2019), 6 mg (10/10/2019), 6 mg (10/31/2019) trastuzumab  (HERCEPTIN ) 600 mg in sodium chloride  0.9 % 250 mL chemo infusion, 567 mg (100 % of original dose 8 mg/kg), Intravenous,  Once, 1 of 1 cycle Dose modification: 8 mg/kg (original dose 8 mg/kg,  Cycle 1, Reason: Other (see comments), Comment: using up last of our Herceptin  at [facility]) Administration: 600 mg (08/27/2019) CARBOplatin  (PARAPLATIN ) 780 mg in sodium chloride  0.9 % 250 mL chemo infusion, 780 mg (100 % of original dose 784.8 mg), Intravenous,  Once, 6 of 6 cycles Dose modification:   (original dose 784.8 mg, Cycle 1),   (original dose 784.8 mg, Cycle 2),   (original dose 784.8 mg, Cycle 5), 784.8 mg (original dose 784.8 mg, Cycle 5, Reason: Other (see comments), Comment: scr 0.8 entered),   (original dose 784.8 mg, Cycle 6),   (original dose 784.8 mg, Cycle 3),   (original dose 784.8 mg, Cycle 4) Administration: 780 mg (08/27/2019), 780 mg (09/17/2019), 780 mg (11/19/2019), 780 mg (12/10/2019), 780 mg (10/08/2019), 780 mg (10/29/2019) DOCEtaxel  (TAXOTERE ) 130 mg in sodium chloride  0.9 % 250 mL chemo infusion, 75 mg/m2 = 130 mg, Intravenous,  Once, 6 of 6 cycles Administration: 130 mg (08/27/2019), 130 mg (09/17/2019), 130 mg (11/19/2019), 130 mg (12/10/2019), 130 mg (10/08/2019), 130 mg (10/29/2019) pertuzumab  (PERJETA ) 840 mg in sodium chloride  0.9 % 250 mL chemo infusion, 840 mg, Intravenous, Once, 6 of 6 cycles Administration: 840 mg (08/27/2019), 420 mg (09/17/2019), 420 mg (11/19/2019), 420 mg (12/10/2019), 420 mg (10/08/2019), 420 mg (10/29/2019) fosaprepitant  (EMEND) 150 mg, dexamethasone  (DECADRON ) 12 mg in sodium chloride  0.9 % 145 mL IVPB, , Intravenous,  Once, 6 of 6 cycles Administration:  (08/27/2019),  (09/17/2019),  (11/19/2019),  (12/10/2019),  (10/08/2019),  (10/29/2019) trastuzumab -dkst (OGIVRI ) 450 mg in sodium chloride  0.9 % 250 mL chemo infusion, 420 mg, Intravenous,  Once, 5 of 5 cycles Administration: 450 mg (09/17/2019), 450 mg (11/19/2019), 420 mg (12/10/2019), 450 mg (10/08/2019), 450 mg (10/29/2019)  for chemotherapy treatment.    01/02/2020 - 10/06/2020 Chemotherapy   Patient is on Treatment Plan : BREAST Trastuzumab  / Pertuzumab  (Perjeta ) q21d      1.  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 -Herceptin  and Pertuzumab  maintenance started on 01/02/2020. -Bilateral mastectomies on 01/24/2020.  Pathology showed YPT0Y PN 0, 0/6 lymph nodes involved. -She met with Dr. Shannon who did not recommend adjuvant radiation. -TAH/BSO on 04/14/2020. -Anastrozole  to start on 04/24/2020.   2.  BRCA1 positive: -Bilateral mastectomies on 01/24/2020. -TAH/BSO on 04/14/2020.  CBC    Component Value Date/Time   WBC 7.4 11/11/2024 0740   RBC 4.95 11/11/2024 0740   HGB 14.3 11/11/2024 0740   HCT 43.2 11/11/2024 0740   PLT 333 11/11/2024 0740   MCV 87.3 11/11/2024 0740   MCH 28.9 11/11/2024 0740   MCHC 33.1 11/11/2024 0740   RDW 12.5 11/11/2024 0740  LYMPHSABS 3.3 11/11/2024 0740   MONOABS 0.5 11/11/2024 0740   EOSABS 0.1 11/11/2024 0740   BASOSABS 0.0 11/11/2024 0740       Latest Ref Rng & Units 11/11/2024    7:40 AM 05/10/2024    9:24 AM 09/21/2023    2:15 PM  CMP  Glucose 70 - 99 mg/dL 885  899  95   BUN 6 - 20 mg/dL 16  15  13    Creatinine 0.44 - 1.00 mg/dL 9.28  9.30  9.38   Sodium 135 - 145 mmol/L 140  140  137   Potassium 3.5 - 5.1 mmol/L 4.0  3.8  3.5   Chloride 98 - 111 mmol/L 104  103  100   CO2 22 - 32 mmol/L 29  26  27    Calcium 8.9 - 10.3 mg/dL 89.5  89.6  89.9   Total Protein 6.5 - 8.1 g/dL 7.7  7.7  8.2   Total Bilirubin 0.0 - 1.2 mg/dL 0.5  0.9  0.6   Alkaline Phos 38 - 126 U/L 105  86  90   AST 15 - 41 U/L 25  21  29    ALT 0 - 44 U/L 35  32  42      No results found for: FERRITIN, VITAMINB12  Vitals:   11/22/24 1007  BP: 107/81  Pulse: (!) 108  Resp: 18  Temp: 99 F (37.2 C)  SpO2: 99%    Review of System:  Review of Systems  Constitutional:  Negative for malaise/fatigue.    Physical Exam: Physical Exam Constitutional:      Appearance: Normal appearance.  HENT:     Head: Normocephalic and atraumatic.  Eyes:     Pupils: Pupils are  equal, round, and reactive to light.  Cardiovascular:     Rate and Rhythm: Normal rate and regular rhythm.     Heart sounds: Normal heart sounds. No murmur heard. Pulmonary:     Effort: Pulmonary effort is normal.     Breath sounds: Normal breath sounds. No wheezing.  Chest:  Breasts:    Right: Absent.     Left: Absent.     Comments: Breast exam unremarkable.  No lymphadenopathy. Abdominal:     General: Bowel sounds are normal. There is no distension.     Palpations: Abdomen is soft.     Tenderness: There is no abdominal tenderness.  Musculoskeletal:        General: Normal range of motion.     Cervical back: Normal range of motion.  Skin:    General: Skin is warm and dry.     Findings: No rash.  Neurological:     Mental Status: She is alert and oriented to person, place, and time.     Gait: Gait is intact.  Psychiatric:        Mood and Affect: Mood and affect normal.        Cognition and Memory: Memory normal.        Judgment: Judgment normal.      I spent 20 minutes dedicated to the care of this patient (face-to-face and non-face-to-face) on the date of the encounter to include what is described in the assessment and plan.,  Delon Hope, NP 11/26/2024 7:59 AM "

## 2024-11-26 ENCOUNTER — Encounter (INDEPENDENT_AMBULATORY_CARE_PROVIDER_SITE_OTHER): Payer: Self-pay | Admitting: *Deleted

## 2024-11-26 NOTE — Assessment & Plan Note (Signed)
 Most recent bone density is from 03/08/2023 which showed a T-score of -0.7. Given she will be on an AI long-term, would recommend repeating in 2 years.  This will be due in April 2026. We will go ahead and get this scheduled and discussed at her next visit.

## 2024-11-26 NOTE — Assessment & Plan Note (Addendum)
 Patient has elevated calcium level on most recent lab draw. We discussed monitoring this level.  Please discontinue all calcium and vitamin D  and we will recheck your labs in 1 month.

## 2024-12-22 ENCOUNTER — Encounter: Payer: Self-pay | Admitting: *Deleted

## 2024-12-23 ENCOUNTER — Inpatient Hospital Stay

## 2024-12-24 ENCOUNTER — Inpatient Hospital Stay

## 2024-12-24 DIAGNOSIS — Z853 Personal history of malignant neoplasm of breast: Secondary | ICD-10-CM

## 2024-12-24 DIAGNOSIS — Z79811 Long term (current) use of aromatase inhibitors: Secondary | ICD-10-CM

## 2024-12-24 LAB — COMPREHENSIVE METABOLIC PANEL WITH GFR
ALT: 32 U/L (ref 0–44)
AST: 25 U/L (ref 15–41)
Albumin: 4.4 g/dL (ref 3.5–5.0)
Alkaline Phosphatase: 100 U/L (ref 38–126)
Anion gap: 13 (ref 5–15)
BUN: 12 mg/dL (ref 6–20)
CO2: 24 mmol/L (ref 22–32)
Calcium: 10.1 mg/dL (ref 8.9–10.3)
Chloride: 103 mmol/L (ref 98–111)
Creatinine, Ser: 0.67 mg/dL (ref 0.44–1.00)
GFR, Estimated: >60 mL/min
Glucose, Bld: 105 mg/dL — ABNORMAL HIGH (ref 70–99)
Potassium: 3.8 mmol/L (ref 3.5–5.1)
Sodium: 139 mmol/L (ref 135–145)
Total Bilirubin: 0.5 mg/dL (ref 0.0–1.2)
Total Protein: 8 g/dL (ref 6.5–8.1)

## 2025-03-10 ENCOUNTER — Other Ambulatory Visit (HOSPITAL_COMMUNITY)

## 2025-05-21 ENCOUNTER — Inpatient Hospital Stay

## 2025-05-28 ENCOUNTER — Inpatient Hospital Stay: Admitting: Oncology
